# Patient Record
Sex: Female | Born: 1935 | Race: White | Hispanic: No | Smoking: Never smoker
Health system: Southern US, Community
[De-identification: ages and names within clinical notes are randomized; demographics above are authoritative.]

## PROBLEM LIST (undated history)

## (undated) DIAGNOSIS — C50919 Malignant neoplasm of unspecified site of unspecified female breast: Secondary | ICD-10-CM

## (undated) DIAGNOSIS — Z932 Ileostomy status: Secondary | ICD-10-CM

## (undated) DIAGNOSIS — Z8719 Personal history of other diseases of the digestive system: Secondary | ICD-10-CM

## (undated) DIAGNOSIS — K219 Gastro-esophageal reflux disease without esophagitis: Secondary | ICD-10-CM

## (undated) DIAGNOSIS — I499 Cardiac arrhythmia, unspecified: Secondary | ICD-10-CM

## (undated) DIAGNOSIS — S0219XS Other fracture of base of skull, sequela: Secondary | ICD-10-CM

## (undated) DIAGNOSIS — M436 Torticollis: Secondary | ICD-10-CM

## (undated) DIAGNOSIS — H9191 Unspecified hearing loss, right ear: Secondary | ICD-10-CM

## (undated) DIAGNOSIS — F329 Major depressive disorder, single episode, unspecified: Secondary | ICD-10-CM

## (undated) DIAGNOSIS — F32A Depression, unspecified: Secondary | ICD-10-CM

## (undated) DIAGNOSIS — I639 Cerebral infarction, unspecified: Secondary | ICD-10-CM

## (undated) DIAGNOSIS — C50911 Malignant neoplasm of unspecified site of right female breast: Secondary | ICD-10-CM

## (undated) DIAGNOSIS — C801 Malignant (primary) neoplasm, unspecified: Secondary | ICD-10-CM

## (undated) DIAGNOSIS — R0609 Other forms of dyspnea: Secondary | ICD-10-CM

## (undated) DIAGNOSIS — E0590 Thyrotoxicosis, unspecified without thyrotoxic crisis or storm: Secondary | ICD-10-CM

## (undated) DIAGNOSIS — D649 Anemia, unspecified: Secondary | ICD-10-CM

## (undated) DIAGNOSIS — I4891 Unspecified atrial fibrillation: Secondary | ICD-10-CM

## (undated) DIAGNOSIS — E039 Hypothyroidism, unspecified: Secondary | ICD-10-CM

## (undated) DIAGNOSIS — C679 Malignant neoplasm of bladder, unspecified: Secondary | ICD-10-CM

## (undated) DIAGNOSIS — F419 Anxiety disorder, unspecified: Secondary | ICD-10-CM

## (undated) DIAGNOSIS — G459 Transient cerebral ischemic attack, unspecified: Secondary | ICD-10-CM

## (undated) DIAGNOSIS — H9190 Unspecified hearing loss, unspecified ear: Secondary | ICD-10-CM

## (undated) DIAGNOSIS — I10 Essential (primary) hypertension: Secondary | ICD-10-CM

## (undated) DIAGNOSIS — Z972 Presence of dental prosthetic device (complete) (partial): Secondary | ICD-10-CM

## (undated) DIAGNOSIS — I482 Chronic atrial fibrillation, unspecified: Secondary | ICD-10-CM

## (undated) DIAGNOSIS — Z974 Presence of external hearing-aid: Secondary | ICD-10-CM

## (undated) HISTORY — DX: Thyrotoxicosis, unspecified without thyrotoxic crisis or storm: E05.90

## (undated) HISTORY — DX: Cardiac arrhythmia, unspecified: I49.9

## (undated) HISTORY — DX: Transient cerebral ischemic attack, unspecified: G45.9

## (undated) HISTORY — PX: ABDOMINAL HYSTERECTOMY: SHX81

## (undated) HISTORY — PX: JOINT REPLACEMENT: SHX530

## (undated) HISTORY — DX: Malignant neoplasm of unspecified site of unspecified female breast: C50.919

## (undated) HISTORY — PX: REPLACEMENT TOTAL KNEE: SUR1224

## (undated) HISTORY — PX: MASTECTOMY: SHX3

## (undated) HISTORY — PX: EYE SURGERY: SHX253

## (undated) HISTORY — PX: CHOLECYSTECTOMY: SHX55

## (undated) HISTORY — PX: BLADDER SURGERY: SHX569

## (undated) HISTORY — PX: ANKLE ARTHODESIS W/ ARTHROSCOPY: SUR63

## (undated) HISTORY — DX: Malignant neoplasm of bladder, unspecified: C67.9

## (undated) HISTORY — DX: Gastro-esophageal reflux disease without esophagitis: K21.9

---

## 2003-07-01 ENCOUNTER — Other Ambulatory Visit: Payer: Self-pay

## 2004-04-25 ENCOUNTER — Encounter: Payer: Self-pay | Admitting: Internal Medicine

## 2004-05-15 ENCOUNTER — Ambulatory Visit: Payer: Self-pay | Admitting: Family Medicine

## 2004-05-18 ENCOUNTER — Encounter: Payer: Self-pay | Admitting: General Practice

## 2004-05-26 ENCOUNTER — Encounter: Payer: Self-pay | Admitting: General Practice

## 2004-06-12 ENCOUNTER — Encounter: Payer: Self-pay | Admitting: Internal Medicine

## 2004-06-22 ENCOUNTER — Ambulatory Visit: Payer: Self-pay | Admitting: Family Medicine

## 2004-06-26 ENCOUNTER — Ambulatory Visit: Payer: Self-pay

## 2004-07-06 ENCOUNTER — Ambulatory Visit: Payer: Self-pay | Admitting: Family Medicine

## 2004-07-10 ENCOUNTER — Encounter: Payer: Self-pay | Admitting: Internal Medicine

## 2004-07-26 ENCOUNTER — Encounter: Payer: Self-pay | Admitting: Internal Medicine

## 2004-08-26 ENCOUNTER — Encounter: Payer: Self-pay | Admitting: Internal Medicine

## 2004-09-25 ENCOUNTER — Encounter: Payer: Self-pay | Admitting: Internal Medicine

## 2004-10-23 ENCOUNTER — Ambulatory Visit: Payer: Self-pay | Admitting: Family Medicine

## 2004-10-24 ENCOUNTER — Encounter: Payer: Self-pay | Admitting: Internal Medicine

## 2004-11-20 ENCOUNTER — Ambulatory Visit: Payer: Self-pay | Admitting: Family Medicine

## 2004-11-23 ENCOUNTER — Encounter: Payer: Self-pay | Admitting: Internal Medicine

## 2004-12-24 ENCOUNTER — Encounter: Payer: Self-pay | Admitting: Internal Medicine

## 2005-02-17 ENCOUNTER — Encounter: Payer: Self-pay | Admitting: Internal Medicine

## 2005-02-23 ENCOUNTER — Encounter: Payer: Self-pay | Admitting: Internal Medicine

## 2005-04-21 ENCOUNTER — Ambulatory Visit: Payer: Self-pay | Admitting: Nurse Practitioner

## 2005-04-23 ENCOUNTER — Encounter: Payer: Self-pay | Admitting: Internal Medicine

## 2005-04-30 ENCOUNTER — Ambulatory Visit: Payer: Self-pay | Admitting: Family Medicine

## 2005-05-05 ENCOUNTER — Ambulatory Visit: Payer: Self-pay | Admitting: Family Medicine

## 2005-05-21 ENCOUNTER — Encounter: Payer: Self-pay | Admitting: Internal Medicine

## 2005-05-26 ENCOUNTER — Encounter: Payer: Self-pay | Admitting: Internal Medicine

## 2005-06-25 ENCOUNTER — Encounter: Payer: Self-pay | Admitting: Internal Medicine

## 2005-07-26 ENCOUNTER — Encounter: Payer: Self-pay | Admitting: Internal Medicine

## 2005-08-26 ENCOUNTER — Encounter: Payer: Self-pay | Admitting: Internal Medicine

## 2005-09-23 ENCOUNTER — Encounter: Payer: Self-pay | Admitting: Internal Medicine

## 2005-10-24 ENCOUNTER — Encounter: Payer: Self-pay | Admitting: Internal Medicine

## 2005-11-23 ENCOUNTER — Encounter: Payer: Self-pay | Admitting: Internal Medicine

## 2005-12-24 ENCOUNTER — Encounter: Payer: Self-pay | Admitting: Internal Medicine

## 2006-02-03 ENCOUNTER — Ambulatory Visit: Payer: Self-pay

## 2006-02-06 ENCOUNTER — Ambulatory Visit: Payer: Self-pay

## 2006-02-07 ENCOUNTER — Ambulatory Visit: Payer: Self-pay | Admitting: Unknown Physician Specialty

## 2006-02-14 ENCOUNTER — Ambulatory Visit: Payer: Self-pay | Admitting: Unknown Physician Specialty

## 2006-02-25 ENCOUNTER — Ambulatory Visit: Payer: Self-pay | Admitting: Family Medicine

## 2006-03-04 ENCOUNTER — Encounter: Payer: Self-pay | Admitting: General Practice

## 2006-03-24 ENCOUNTER — Ambulatory Visit: Payer: Self-pay | Admitting: Unknown Physician Specialty

## 2006-03-26 ENCOUNTER — Encounter: Payer: Self-pay | Admitting: General Practice

## 2006-03-31 ENCOUNTER — Ambulatory Visit: Payer: Self-pay | Admitting: Family Medicine

## 2006-04-05 ENCOUNTER — Ambulatory Visit: Payer: Self-pay

## 2006-04-19 ENCOUNTER — Ambulatory Visit: Payer: Self-pay | Admitting: General Practice

## 2006-04-19 ENCOUNTER — Other Ambulatory Visit: Payer: Self-pay

## 2006-04-21 ENCOUNTER — Ambulatory Visit: Payer: Self-pay | Admitting: General Practice

## 2006-06-10 ENCOUNTER — Ambulatory Visit: Payer: Self-pay

## 2006-06-14 ENCOUNTER — Ambulatory Visit: Payer: Self-pay

## 2006-06-24 ENCOUNTER — Ambulatory Visit: Payer: Self-pay | Admitting: Surgery

## 2006-06-24 ENCOUNTER — Other Ambulatory Visit: Payer: Self-pay

## 2006-06-27 ENCOUNTER — Ambulatory Visit: Payer: Self-pay | Admitting: Surgery

## 2006-06-30 ENCOUNTER — Ambulatory Visit: Payer: Self-pay | Admitting: Surgery

## 2006-08-01 ENCOUNTER — Ambulatory Visit: Payer: Self-pay | Admitting: Surgery

## 2006-08-15 ENCOUNTER — Ambulatory Visit: Payer: Self-pay | Admitting: Internal Medicine

## 2006-08-17 ENCOUNTER — Encounter: Payer: Self-pay | Admitting: Internal Medicine

## 2006-08-26 ENCOUNTER — Ambulatory Visit: Payer: Self-pay | Admitting: Internal Medicine

## 2006-09-09 ENCOUNTER — Encounter: Payer: Self-pay | Admitting: Internal Medicine

## 2006-09-24 ENCOUNTER — Ambulatory Visit: Payer: Self-pay | Admitting: Radiation Oncology

## 2006-09-24 ENCOUNTER — Ambulatory Visit: Payer: Self-pay | Admitting: Internal Medicine

## 2006-10-07 ENCOUNTER — Encounter: Payer: Self-pay | Admitting: Internal Medicine

## 2006-10-25 ENCOUNTER — Ambulatory Visit: Payer: Self-pay | Admitting: Internal Medicine

## 2006-10-31 ENCOUNTER — Encounter: Payer: Self-pay | Admitting: Internal Medicine

## 2006-11-24 ENCOUNTER — Ambulatory Visit: Payer: Self-pay | Admitting: Internal Medicine

## 2006-11-24 ENCOUNTER — Encounter: Payer: Self-pay | Admitting: Internal Medicine

## 2006-11-25 ENCOUNTER — Ambulatory Visit: Payer: Self-pay | Admitting: Radiation Oncology

## 2006-12-25 ENCOUNTER — Encounter: Payer: Self-pay | Admitting: Internal Medicine

## 2006-12-25 ENCOUNTER — Ambulatory Visit: Payer: Self-pay | Admitting: Radiation Oncology

## 2007-01-24 ENCOUNTER — Encounter: Payer: Self-pay | Admitting: Internal Medicine

## 2007-02-06 ENCOUNTER — Ambulatory Visit: Payer: Self-pay | Admitting: Family Medicine

## 2007-02-24 ENCOUNTER — Ambulatory Visit: Payer: Self-pay | Admitting: Internal Medicine

## 2007-03-06 ENCOUNTER — Encounter: Payer: Self-pay | Admitting: Internal Medicine

## 2007-03-13 ENCOUNTER — Ambulatory Visit: Payer: Self-pay | Admitting: Internal Medicine

## 2007-03-14 ENCOUNTER — Ambulatory Visit: Payer: Self-pay | Admitting: Internal Medicine

## 2007-03-17 ENCOUNTER — Ambulatory Visit: Payer: Self-pay | Admitting: Internal Medicine

## 2007-03-27 ENCOUNTER — Ambulatory Visit: Payer: Self-pay | Admitting: Internal Medicine

## 2007-04-04 ENCOUNTER — Encounter: Payer: Self-pay | Admitting: Internal Medicine

## 2007-04-18 ENCOUNTER — Ambulatory Visit: Payer: Self-pay | Admitting: Unknown Physician Specialty

## 2007-04-21 ENCOUNTER — Ambulatory Visit: Payer: Self-pay | Admitting: Family Medicine

## 2007-04-28 ENCOUNTER — Encounter: Payer: Self-pay | Admitting: Internal Medicine

## 2007-05-19 ENCOUNTER — Ambulatory Visit: Payer: Self-pay | Admitting: General Practice

## 2007-05-19 ENCOUNTER — Other Ambulatory Visit: Payer: Self-pay

## 2007-05-27 ENCOUNTER — Encounter: Payer: Self-pay | Admitting: Internal Medicine

## 2007-06-05 ENCOUNTER — Inpatient Hospital Stay: Payer: Self-pay | Admitting: General Practice

## 2007-07-12 ENCOUNTER — Ambulatory Visit: Payer: Self-pay | Admitting: Internal Medicine

## 2007-08-16 ENCOUNTER — Ambulatory Visit: Payer: Self-pay | Admitting: Neurological Surgery

## 2007-09-23 ENCOUNTER — Emergency Department: Payer: Self-pay | Admitting: Emergency Medicine

## 2008-01-29 ENCOUNTER — Encounter: Payer: Self-pay | Admitting: Internal Medicine

## 2008-02-24 ENCOUNTER — Encounter: Payer: Self-pay | Admitting: Internal Medicine

## 2008-02-24 ENCOUNTER — Ambulatory Visit: Payer: Self-pay | Admitting: Internal Medicine

## 2008-02-27 ENCOUNTER — Ambulatory Visit: Payer: Self-pay | Admitting: Internal Medicine

## 2008-03-26 ENCOUNTER — Ambulatory Visit: Payer: Self-pay | Admitting: Internal Medicine

## 2008-03-29 ENCOUNTER — Encounter: Payer: Self-pay | Admitting: Internal Medicine

## 2008-04-25 ENCOUNTER — Encounter: Payer: Self-pay | Admitting: Internal Medicine

## 2008-06-22 ENCOUNTER — Emergency Department: Payer: Self-pay | Admitting: Emergency Medicine

## 2008-06-24 ENCOUNTER — Ambulatory Visit: Payer: Self-pay | Admitting: Surgery

## 2008-07-01 ENCOUNTER — Inpatient Hospital Stay: Payer: Self-pay | Admitting: Surgery

## 2008-08-02 ENCOUNTER — Ambulatory Visit: Payer: Self-pay | Admitting: Internal Medicine

## 2008-08-12 ENCOUNTER — Ambulatory Visit: Payer: Self-pay | Admitting: Internal Medicine

## 2008-08-26 ENCOUNTER — Ambulatory Visit: Payer: Self-pay | Admitting: Internal Medicine

## 2008-08-29 ENCOUNTER — Ambulatory Visit: Payer: Self-pay | Admitting: Internal Medicine

## 2008-09-23 ENCOUNTER — Ambulatory Visit: Payer: Self-pay | Admitting: Internal Medicine

## 2009-02-17 ENCOUNTER — Ambulatory Visit: Payer: Self-pay | Admitting: Internal Medicine

## 2009-02-23 ENCOUNTER — Ambulatory Visit: Payer: Self-pay | Admitting: Internal Medicine

## 2009-02-26 ENCOUNTER — Ambulatory Visit: Payer: Self-pay | Admitting: Internal Medicine

## 2009-03-07 ENCOUNTER — Ambulatory Visit: Payer: Self-pay | Admitting: Urology

## 2009-03-11 ENCOUNTER — Ambulatory Visit: Payer: Self-pay | Admitting: Urology

## 2009-03-26 ENCOUNTER — Ambulatory Visit: Payer: Self-pay | Admitting: Urology

## 2009-03-26 ENCOUNTER — Ambulatory Visit: Payer: Self-pay | Admitting: Internal Medicine

## 2009-03-28 ENCOUNTER — Inpatient Hospital Stay: Payer: Self-pay | Admitting: Internal Medicine

## 2009-04-01 ENCOUNTER — Ambulatory Visit: Payer: Self-pay | Admitting: Internal Medicine

## 2009-05-30 ENCOUNTER — Emergency Department: Payer: Self-pay | Admitting: Emergency Medicine

## 2009-09-03 ENCOUNTER — Ambulatory Visit: Payer: Self-pay | Admitting: Surgery

## 2010-02-01 ENCOUNTER — Emergency Department: Payer: Self-pay | Admitting: Emergency Medicine

## 2010-02-24 ENCOUNTER — Ambulatory Visit: Payer: Self-pay | Admitting: Urology

## 2010-02-26 LAB — PATHOLOGY REPORT

## 2010-10-08 ENCOUNTER — Ambulatory Visit: Payer: Self-pay | Admitting: Surgery

## 2010-10-13 ENCOUNTER — Ambulatory Visit: Payer: Self-pay | Admitting: Surgery

## 2011-03-27 ENCOUNTER — Ambulatory Visit: Payer: Self-pay | Admitting: Internal Medicine

## 2011-05-26 ENCOUNTER — Ambulatory Visit: Payer: Self-pay | Admitting: Surgery

## 2011-08-17 ENCOUNTER — Emergency Department: Payer: Self-pay | Admitting: Emergency Medicine

## 2011-08-17 LAB — PROTIME-INR
INR: 1.5
Prothrombin Time: 18 secs — ABNORMAL HIGH (ref 11.5–14.7)

## 2011-08-17 LAB — COMPREHENSIVE METABOLIC PANEL
Albumin: 3.7 g/dL (ref 3.4–5.0)
Alkaline Phosphatase: 67 U/L (ref 50–136)
Calcium, Total: 9 mg/dL (ref 8.5–10.1)
EGFR (Non-African Amer.): >60
Glucose: 95 mg/dL (ref 65–99)
SGOT(AST): 23 U/L (ref 15–37)

## 2011-08-17 LAB — URINALYSIS, COMPLETE
Glucose,UR: NEGATIVE mg/dL (ref 0–75)
Ketone: NEGATIVE
Nitrite: NEGATIVE
Protein: NEGATIVE
Specific Gravity: 1.009 (ref 1.003–1.030)

## 2011-08-17 LAB — CBC WITH DIFFERENTIAL/PLATELET
Basophil #: 0 10*3/uL (ref 0.0–0.1)
Basophil %: 0.5 %
Eosinophil #: 0.1 10*3/uL (ref 0.0–0.7)
Eosinophil %: 2.1 %
HCT: 41.6 % (ref 35.0–47.0)
HGB: 14 g/dL (ref 12.0–16.0)
Lymphocyte %: 23.2 %
MCH: 32.2 pg (ref 26.0–34.0)
MCHC: 33.7 g/dL (ref 32.0–36.0)
MCV: 96 fL (ref 80–100)
Monocyte #: 0.4 10*3/uL (ref 0.0–0.7)
Neutrophil %: 67.3 %
RDW: 13.2 % (ref 11.5–14.5)

## 2011-08-17 LAB — CK TOTAL AND CKMB (NOT AT ARMC)
CK, Total: 72 U/L (ref 21–215)
CK-MB: 0.8 ng/mL (ref 0.5–3.6)

## 2011-08-17 LAB — TROPONIN I: Troponin-I: <0.02 ng/mL

## 2011-08-19 LAB — URINE CULTURE

## 2011-09-06 ENCOUNTER — Ambulatory Visit: Payer: Self-pay | Admitting: Neurology

## 2012-01-20 ENCOUNTER — Ambulatory Visit: Payer: Self-pay | Admitting: Surgery

## 2012-02-01 ENCOUNTER — Ambulatory Visit: Payer: Self-pay | Admitting: Surgery

## 2012-08-08 ENCOUNTER — Ambulatory Visit: Payer: Self-pay | Admitting: Surgery

## 2012-11-13 ENCOUNTER — Inpatient Hospital Stay: Payer: Self-pay | Admitting: Orthopedic Surgery

## 2012-11-13 LAB — CBC
HCT: 37.1 % (ref 35.0–47.0)
MCHC: 33.6 g/dL (ref 32.0–36.0)
MCV: 91 fL (ref 80–100)
RBC: 4.09 10*6/uL (ref 3.80–5.20)
RDW: 14.7 % — ABNORMAL HIGH (ref 11.5–14.5)
WBC: 10.2 10*3/uL (ref 3.6–11.0)

## 2012-11-13 LAB — BASIC METABOLIC PANEL
BUN: 14 mg/dL (ref 7–18)
Calcium, Total: 8.6 mg/dL (ref 8.5–10.1)
Chloride: 110 mmol/L — ABNORMAL HIGH (ref 98–107)
Co2: 27 mmol/L (ref 21–32)
Creatinine: 0.64 mg/dL (ref 0.60–1.30)
EGFR (African American): >60
EGFR (Non-African Amer.): >60
Sodium: 141 mmol/L (ref 136–145)

## 2012-11-13 LAB — PROTIME-INR: INR: 2.6

## 2012-11-14 LAB — CBC WITH DIFFERENTIAL/PLATELET
Basophil #: 0 10*3/uL (ref 0.0–0.1)
Eosinophil #: 0.1 10*3/uL (ref 0.0–0.7)
Eosinophil %: 1.7 %
HCT: 33.8 % — ABNORMAL LOW (ref 35.0–47.0)
HGB: 11.4 g/dL — ABNORMAL LOW (ref 12.0–16.0)
Lymphocyte #: 1.4 10*3/uL (ref 1.0–3.6)
MCH: 30.8 pg (ref 26.0–34.0)
MCHC: 33.7 g/dL (ref 32.0–36.0)
Neutrophil #: 3.2 10*3/uL (ref 1.4–6.5)
Neutrophil %: 59.6 %
Platelet: 232 10*3/uL (ref 150–440)
RDW: 14.3 % (ref 11.5–14.5)
WBC: 5.4 10*3/uL (ref 3.6–11.0)

## 2012-11-14 LAB — PROTIME-INR
INR: 1.8
INR: 1.4

## 2012-11-14 LAB — BASIC METABOLIC PANEL
Anion Gap: 5 — ABNORMAL LOW (ref 7–16)
BUN: 10 mg/dL (ref 7–18)
Calcium, Total: 8.3 mg/dL — ABNORMAL LOW (ref 8.5–10.1)
Chloride: 107 mmol/L (ref 98–107)
EGFR (African American): >60
EGFR (Non-African Amer.): >60
Glucose: 111 mg/dL — ABNORMAL HIGH (ref 65–99)
Osmolality: 277 (ref 275–301)
Potassium: 3.3 mmol/L — ABNORMAL LOW (ref 3.5–5.1)
Sodium: 139 mmol/L (ref 136–145)

## 2012-11-15 ENCOUNTER — Encounter: Payer: Self-pay | Admitting: Internal Medicine

## 2012-11-15 LAB — CBC WITH DIFFERENTIAL/PLATELET
Basophil #: 0 10*3/uL (ref 0.0–0.1)
Basophil %: 0.3 %
Eosinophil #: 0.1 10*3/uL (ref 0.0–0.7)
Lymphocyte #: 0.7 10*3/uL — ABNORMAL LOW (ref 1.0–3.6)
Lymphocyte %: 9 %
MCH: 30.7 pg (ref 26.0–34.0)
MCHC: 33.4 g/dL (ref 32.0–36.0)
MCV: 92 fL (ref 80–100)
Monocyte %: 11.2 %
Neutrophil #: 6.2 10*3/uL (ref 1.4–6.5)
Neutrophil %: 78.5 %
RDW: 13.9 % (ref 11.5–14.5)
WBC: 7.9 10*3/uL (ref 3.6–11.0)

## 2012-11-15 LAB — BASIC METABOLIC PANEL
Anion Gap: 5 — ABNORMAL LOW (ref 7–16)
BUN: 9 mg/dL (ref 7–18)
Calcium, Total: 8.3 mg/dL — ABNORMAL LOW (ref 8.5–10.1)
Co2: 29 mmol/L (ref 21–32)
Creatinine: 0.58 mg/dL — ABNORMAL LOW (ref 0.60–1.30)
EGFR (Non-African Amer.): >60
Glucose: 117 mg/dL — ABNORMAL HIGH (ref 65–99)
Osmolality: 275 (ref 275–301)
Sodium: 138 mmol/L (ref 136–145)

## 2012-11-15 LAB — PROTIME-INR: INR: 1.5

## 2012-11-16 LAB — BASIC METABOLIC PANEL
Anion Gap: 4 — ABNORMAL LOW (ref 7–16)
Chloride: 104 mmol/L (ref 98–107)
Creatinine: 0.79 mg/dL (ref 0.60–1.30)
EGFR (Non-African Amer.): >60
Osmolality: 277 (ref 275–301)
Potassium: 3.3 mmol/L — ABNORMAL LOW (ref 3.5–5.1)
Sodium: 139 mmol/L (ref 136–145)

## 2012-11-16 LAB — PROTIME-INR: Prothrombin Time: 18.1 secs — ABNORMAL HIGH (ref 11.5–14.7)

## 2012-11-20 LAB — PROTIME-INR
INR: 1.9
Prothrombin Time: 21.3 s — ABNORMAL HIGH

## 2012-11-23 ENCOUNTER — Encounter: Payer: Self-pay | Admitting: Internal Medicine

## 2012-11-23 LAB — PROTIME-INR
INR: 2.2
Prothrombin Time: 23.9 secs — ABNORMAL HIGH (ref 11.5–14.7)

## 2012-11-28 LAB — PROTIME-INR: INR: 2.5

## 2012-12-24 ENCOUNTER — Encounter: Payer: Self-pay | Admitting: Internal Medicine

## 2013-05-08 ENCOUNTER — Ambulatory Visit: Payer: Self-pay | Admitting: Internal Medicine

## 2013-05-08 LAB — URINALYSIS, COMPLETE
Bilirubin,UR: NEGATIVE
Glucose,UR: NEGATIVE mg/dL (ref 0–75)
Specific Gravity: 1.01 (ref 1.003–1.030)
WBC UR: >30 /HPF (ref 0–5)

## 2013-05-10 LAB — URINE CULTURE

## 2014-10-28 ENCOUNTER — Ambulatory Visit
Admit: 2014-10-28 | Disposition: A | Discharge status: Home / Self Care | Payer: Self-pay | Attending: Ophthalmology | Admitting: Ophthalmology

## 2014-10-28 LAB — PROTIME-INR
INR: 2
PROTHROMBIN TIME: 22.7 s — AB

## 2014-11-04 ENCOUNTER — Ambulatory Visit
Admit: 2014-11-04 | Disposition: A | Discharge status: Home / Self Care | Payer: Self-pay | Attending: Ophthalmology | Admitting: Ophthalmology

## 2014-11-15 NOTE — H&P (Signed)
Subjective/Chief Complaint left ankle pain   History of Present Illness [place] while walking out her door.  No LOC.  No other injury.  Notes severe left ankle pain. Denies numbness and tingling. History of prior ORIF fibular over 20 years ago by Dr. Marry Guan.   Past Med/Surgical Hx:  CVA/Stroke:   thyroid:   Depression:   Hypertension:   Atrial Fibrillation:   Right  Partial Mastectomy:   Cholecystectomy:   Right Total Knee Replacement:   ankle: questionable which side  breast bx: right  bladder:   knee x3:   Hysterectomy - Total:   ALLERGIES:  Celebrex: Itching, Swelling, Rash  Morphine Sulfate: Swelling  HOME MEDICATIONS: Medication Instructions Status  Zoloft 50 mg oral tablet 1 tab(s) orally once a day Active  Coumadin 2.5 mg oral tablet 1 tab(s) orally once a day Active  Coumadin 1 mg oral tablet 1 tab(s) orally once a day Active  Synthroid 137 mcg (0.137 mg) oral tablet 1 tab(s) orally once a day Active  Diltiazem Hydrochloride ER 240 mg/24 hours oral capsule, extended release 1 cap(s) orally once a day Active  Xanax 0.5 mg oral tablet 1 tab(s) orally once a day (at bedtime), As Needed - for Anxiety, Nervousness Active  omeprazole 20 mg oral delayed release capsule 1 cap(s) orally once a day Active  simvastatin 10 mg oral tablet 1 tab(s) orally once a day (at bedtime) Active   Family and Social History:  Family History Non-Contributory   Social History negative tobacco, negative ETOH, negative Illicit drugs   Place of Living Home  lives alone. no assistive device   Review of Systems:  Subjective/Chief Complaint Left ankle pain after fall   Fever/Chills No   Cough No   Sputum No   Abdominal Pain No   Diarrhea No   Constipation No   Nausea/Vomiting No   SOB/DOE No   Chest Pain No   Telemetry Reviewed Afib   Dysuria No   Medications/Allergies Reviewed Medications/Allergies reviewed   Physical Exam:  GEN well developed, no acute distress, good  insight   HEENT PERRL, hearing intact to voice, moist oral mucosa, hard of hearing - no aid.   NECK supple  No masses  trachea midline   RESP normal resp effort  clear BS  no use of accessory muscles   CARD no thrills  LE edema present  + atrial fib   ABD denies tenderness  no liver/spleen enlargement  hypoactive BS   EXTR negative cyanosis/clubbing, left ankle in splint - unwrapped for exam. skin without lesion. DPN, SPN, tib n intact. cap refill <2 secs. Moves toes.   SKIN normal to palpation, No rashes, No ulcers, skin turgor decreased   NEURO follows commands   PSYCH alert, A+O to time, place, person, good insight   Lab Results: Routine BB:  21-Apr-14 14:03   ABO Group + Rh Type A Positive  Antibody Screen NEGATIVE (Result(s) reported on 13 Nov 2012 at 03:07PM.)  Fresh Frozen Plasma Unit 1 Issued  Fresh Frozen Plasma Unit 2 Ready (Result(s) reported on 13 Nov 2012 at 03:19PM.)  Routine Chem:  21-Apr-14 10:06   Glucose, Serum  103  BUN 14  Creatinine (comp) 0.64  Sodium, Serum 141  Potassium, Serum 3.7  Chloride, Serum  110  CO2, Serum 27  Calcium (Total), Serum 8.6  Anion Gap  4  Osmolality (calc) 282  eGFR (African American) >60  eGFR (Non-African American) >60 (eGFR values <34m/min/1.73 m2 may be an indication  of chronic kidney disease (CKD). Calculated eGFR is useful in patients with stable renal function. The eGFR calculation will not be reliable in acutely ill patients when serum creatinine is changing rapidly. It is not useful in  patients on dialysis. The eGFR calculation may not be applicable to patients at the low and high extremes of body sizes, pregnant women, and vegetarians.)  Routine Coag:  21-Apr-14 10:06   Prothrombin  27.0  INR 2.6 (INR reference interval applies to patients on anticoagulant therapy. A single INR therapeutic range for coumarins is not optimal for all indications; however, the suggested range for most indications is 2.0 -  3.0. Exceptions to the INR Reference Range may include: Prosthetic heart valves, acute myocardial infarction, prevention of myocardial infarction, and combinations of aspirin and anticoagulant. The need for a higher or lower target INR must be assessed individually. Reference: The Pharmacology and Management of the Vitamin K  antagonists: the seventh ACCP Conference on Antithrombotic and Thrombolytic Therapy. AEPNT.7505 Sept:126 (3suppl): [id number]. A HCT value >55% may artifactually increase the PT.  In one study,  the increase was an average of 25%. Reference:  "Effect on Routine and Special Coagulation Testing Values of Citrate Anticoagulant Adjustment in Patients with High HCT Values." American Journal of Clinical Pathology 2006;126:400-405.)  Routine Hem:  21-Apr-14 10:06   WBC (CBC) 10.2  RBC (CBC) 4.09  Hemoglobin (CBC) 12.5  Hematocrit (CBC) 37.1  Platelet Count (CBC) 242 (Result(s) reported on 13 Nov 2012 at 10:44AM.)  MCV 91  MCH 30.5  MCHC 33.6  RDW  14.7   Radiology Results: LabUnknown:    21-Apr-14 09:00, Ankle Left Complete  PACS Image    Assessment/Admission Diagnosis left ankle distal fibular fracture comminuted about old plate/screws. Small post mal fragment. Syndesmosis injury.   Plan Admit today for correction of INR from 2.6 to <1.5 overnight. Plan for ORIF of ankle and syndesmosis tomorrow. Risks and benefits explains to patient and daughter. Understand risk of infection, blood loss, failure, difficulty with removal of old hardware.  NPO after midnight.  Medical consult for clearance.   Electronic Signatures: Dawayne Patricia (MD)  (Signed 21-Apr-14 16:51)  Authored: CHIEF COMPLAINT and HISTORY, PAST MEDICAL/SURGIAL HISTORY, ALLERGIES, HOME MEDICATIONS, FAMILY AND SOCIAL HISTORY, REVIEW OF SYSTEMS, PHYSICAL EXAM, LABS, Radiology, ASSESSMENT AND PLAN   Last Updated: 21-Apr-14 16:51 by Dawayne Patricia (MD)

## 2014-11-15 NOTE — Consult Note (Signed)
Brief Consult Note: Diagnosis: 1. Pre-operative Medical Evaluation 2. Chronic a. fib 3. HTN 4. Depression 5. Hyperlipidemia.   Patient was seen by consultant.   Consult note dictated.   Recommend to proceed with surgery or procedure.   Orders entered.   Comments: 81 yo female w/ hx of Osteoarthritis, Chronic a. fib, HtN, Depression, Hyperlipidemia came into hospital after a fall and noted to have Left Ankle Fracture.   1. Pre-operative evaluation - pt. is likely a moderate risk for non-cardiac surgery.  - no contraindications to surgery at this time.  - ECG reviewed and non acute changes.   2. Chronic a. fib - rate controlled and cont. Cardizem.  - will reverse INR  w/ FFP and Vitamin K as pt. needs surgery by tomorrow.   3. HTN - cont. Cardizem  4. Depression - cont. Zoloft.   5. Hypothyroidism - cont. synthroid.   6. hx of previous CVA - pt. had a previous CVA/TIA when taken off coumadin before and therefore I would restart heparin gtt after surgery tomorrow and then resume coumadin later that evening or the next day.    Thanks for the consult and will follow with you.  Job # [id number].  Electronic Signatures: Henreitta Leber (MD)  (Signed 21-Apr-14 14:03)  Authored: Brief Consult Note   Last Updated: 21-Apr-14 14:03 by Henreitta Leber (MD)

## 2014-11-15 NOTE — Op Note (Signed)
PATIENT NAME:  Gabriela [name], Gabriela [name] MR#:  [id number] DATE OF BIRTH:  04/28/1932  DATE OF SURGERY:  11/14/2012  PREOPERATIVE DIAGNOSIS: Comminuted fracture, left distal fibula about previous fibular plate, with syndesmotic separation.   POSTOPERATIVE DIAGNOSIS: Comminuted fracture, left distal fibula about previous fibular plate, with syndesmotic separation.    PROCEDURES PERFORMED:  1.  Removal of hardware from left distal fibula.  2.  Open reduction and internal fixation of left distal fibula fracture.  3.  Syndesmotic fixation using tightrope mechanism.  4.  Filling of bone defect using calcium phosphate, injectable.   SURGEON: Shalini Ramasunder M.D.   ASSISTANT: None.   TOURNIQUET TIME: 130 minutes at 300 mmHg.   FINDINGS: Severely comminuted fracture about lateral fibular plate inserted over 20 years ago. Poor bone quality. Significantly widened and unstable syndesmosis on Cotton testing and external rotation.   IMPLANTS: Arthrex 8-hole distal fibular plate, not with tightrope syndesmosis repair. Implants x 2, Arthrex Quickset kit, injectable macroporous calcium phosphate solution, 5 mL.   COMPLICATIONS: None.   DISPOSITION: The patient will be placed in a splint. She will be nonweightbearing. She will return to the office in 1 week for a clinical check.   INDICATIONS FOR PROCEDURE: The patient is a 81-year-old female who presented to the Emergency Room after sustaining a fall down several steps. She sustained a severely displaced fracture of the left ankle. She had a prior fracture 20 years ago, that was plated. Current fracture demonstrates significant comminution about the prior screw tips. There was also significant syndesmotic disruption.   Risks and benefits of the procedure were explained to the patient and her daughter. Informed consent was obtained.   DESCRIPTION OF PROCEDURE: The patient was identified in the preoperative holding area. Left ankle was marked.   The patient  was brought into the operating room where general anesthesia was administered. The splint was removed. Tourniquet was applied. Left ankle was prepared and draped in the usual sterile fashion. Leg was elevated and tourniquet was inflated to 300 mmHg. Using fluoroscopic guidance and the prior incision,  incision was made directly over the lateral fibula. Using the Synthes screw removal set, 7 screws were removed and a contoured compression plate was removed. There was extensive scar and bony overgrowth. After removal of the plate a severely comminuted fracture of the distal fibula with extremely poor bone quality was noted. All prior screw holes were curetted. There was no evidence of infection.   At this time the ankle was very copiously irrigated. Clamp reduction was done to hold the fracture aligned. An 8-hole distal fibular Arthrex plate was trialed for length. This would achieve adequate bridging of the fracture with good distal fixation using a locking screw mechanism and at least 6 cortices of fixation above the fracture site. The plate was held in place using a tack. Confirmation of placement of the plate was confirmed on orthogonal imaging. The plate was placed in a slightly posterolateral position in order to achieve better fixation and allow for better soft tissue coverage. A proximal cortical screw was drilled in place, and bone integrity was found to be stable.   At this time with tacks securing the plate distally and one point of fixation proximally distal, all  5 locking screws in the distal fragment were filled using multiple 2.5 locking screws. Attention was returned to the proximal aspect of the, fibula where 2 additional holes were filled above the fracture site in a bicortical fashion. On orthogonal radiographs, excellent alignment of the   fracture was achieved. At this time with fibula fixation complete, the syndesmosis was stressed and gapped widely open. One of the distal holes at the level  of the distal syndesmosis was filled with a guidewire. Placement of the guidewire was confirmed on orthogonal radiographs and found to be in good position for syndesmotic fixation. This was overdrilled, and a knotless tightrope syndesmosis repair implant was passed through. Using a tightrope mechanism, the syndesmosis was nicely reduced.     The fibula was again pulled and the joint was again stressed, and the syndesmosis was found to be nicely fixed. For additional fixation, using a similar technique a second syndesmotic tightrope was placed just distal to the initial one. Syndesmosis was found to have excellent stability as with the fracture site.   Using 5 mL of injectable macroporous calcium phosphate  through a 14-gauge Angiocath, the prior screw holes were filled as was a small area of weight in the fracture site, and once the Quickset was dried, the wound was again copiously irrigated. Care was taken to ensure that the peroneal tendons were completely free of the plate through implantation. They traveled nicely with motion of the ankle. A soft tissue sleeve was gently reapproximated over the plate to achieve some additional soft tissue coverage. Subcutaneous tissue was closed using a 2-0 Vicryl, and skin was closed using staples. A sterile bulky dressing was applied and the patient was placed in a posterior and sugar tong splint.   She will be nonweightbearing. She will be kept in the hospital for pain management as well as for physical therapy. She will likely require skilled nursing placement. We will see her back in the office 1 week postoperatively for a wound check.      ____________________________ Shalini Ramasunder, MD sr:dm D: 11/16/2012 09:08:35 ET T: 11/16/2012 09:29:25 ET JOB#: [id number]  cc: Shalini Ramasunder, MD, <Dictator> SHALINI RAMASUNDER MD ELECTRONICALLY SIGNED 12/13/2012 9:07 

## 2014-11-15 NOTE — Consult Note (Signed)
PATIENT NAME:  Gabriela [name], Gabriela [name] MR#:  [id number] DATE OF BIRTH:  1931-09-11  DATE OF CONSULTATION:  11/13/2012  REFERRING PHYSICIAN:  Dawayne Patricia, MD CONSULTING PHYSICIAN:  Belia Heman. Verdell Carmine, MD  REASON FOR CONSULTATION: Preoperative medical evaluation and medical management.   HISTORY OF PRESENT ILLNESS: This is a 81 year old female who presents to the hospital after a fall and noted to have a left ankle fracture. She was getting out of her house and fell. She called one of her friends, who called her neighbor, and who picked her up and brought her to the ER. The patient had an x-ray of the left ankle which showed a fracture of the medial malleolus. The patient had no prodromal symptoms prior to the fall like any chest pain, shortness of breath, dizziness, palpitations, any true syncope or any other associated symptoms. Hospitalist services were contacted for preoperative medical evaluation and medical management.   REVIEW OF SYSTEMS:  CONSTITUTIONAL: No documented fever. No weight gain, no weight loss.  EYES: No blurry or double vision.  ENT: No tinnitus. No postnasal drip. No redness of the oropharynx.  RESPIRATORY: No cough, no wheeze, no hemoptysis.  CARDIOVASCULAR: No chest pain, no orthopnea, no palpitations, no syncope.  GASTROINTESTINAL: No nausea, no vomiting, diarrhea, no abdominal pain, no melena or hematochezia.  GENITOURINARY: No dysuria, no hematuria.  ENDOCRINE: No polyuria or nocturia, heat or cold intolerance. HEMATOLOGIC: No anemia, no bruising, no bleeding.  INTEGUMENTARY: No rashes, no lesions.  MUSCULOSKELETAL: No arthritis, no swelling, no gout.  NEUROLOGIC: No numbness, no tingling, no ataxia. No seizure-type activity.  PSYCHIATRIC: No anxiety. No insomnia. No ADD. Positive depression.   PAST MEDICAL HISTORY: Consistent with osteoarthritis with bilateral knee replacements, history of breast cancer, history of bladder cancer, hypertension, chronic atrial  fibrillation, GERD, hyperlipidemia and hypothyroidism.   ALLERGIES: CELEBREX AND MORPHINE SULFATE.  MORPHINE CAUSES ANAPHYLAXIS.  SOCIAL HISTORY: No smoking. No alcohol abuse. No illicit drug abuse. She lives at home by herself.   FAMILY HISTORY: She was adopted. She cannot recall her family history.   CURRENT MEDICATIONS: Coumadin 1 mg daily. Coumadin   2.5 mg daily with a total of 3.5 mg daily, Cardizem 240 mg daily, omeprazole 20 mg daily, simvastatin 10 mg daily, Synthroid 137 mcg daily, Xanax 0.5 mg at bedtime as needed, Zoloft 50 mg daily.   PHYSICAL EXAMINATION: VITAL SIGNS: On admission, temperature is 98, pulse 94, respirations 18, blood pressure 168/79, sats 97% on room air.  GENERAL: She is a pleasant-appearing female in no apparent distress.  HEENT: She is atraumatic, normocephalic. Extraocular muscles are intact. Pupils are equal, reactive to light. Sclerae are anicteric. No conjunctival injection. No pharyngeal erythema.  NECK: Supple. There is no jugular venous distention, no bruits, no lymphadenopathy or thyromegaly.  HEART: Irregular. No murmurs, no rubs, no clicks.  LUNGS: Clear to auscultation bilaterally. No rales, no rhonchi, no wheezes.  ABDOMEN: Soft, flat, nontender, nondistended. Has good bowel sounds. No hepatosplenomegaly appreciated.  EXTREMITIES: No evidence of any cyanosis, clubbing, or peripheral edema. Has +2 pedal and radial pulses bilaterally. Her left lower extremity is in a cast due to her fracture.  NEUROLOGIC: She is alert, awake and oriented x3 with no focal motor or sensory deficits appreciated bilaterally.  SKIN: Moist and warm with no rashes.  LYMPHATIC: There is no cervical or axillary lymphadenopathy.   LABORATORY AND RADIOLOGICAL DATA:  Serum glucose 103, BUN 14, creatinine 0.6, sodium 141, potassium 3.7, chloride 110, bicarbonate 27. White cell count is  10.2, hemoglobin 12.5, hematocrit 37.1, platelet count 242. INR is 2.6.   The patient did have  an x-ray of the left ankle which showed a nondisplaced fracture involving the medial malleolus, a chest x-ray which showed no acute cardiopulmonary disease, and an EKG which showed chronic atrial fibrillation with no evidence of any acute ST or T-wave changes.   ASSESSMENT AND PLAN: This is a 81 year old female with a history of severe osteoarthritis, chronic a-fib, hypertension, depression, hyperlipidemia, hypothyroidism, history of breast and bladder cancer, presents to the hospital after a fall and noted to have a left ankle fracture.   1. Preoperative evaluation: The patient is likely a moderate risk for noncardiac surgery. There is no absolute contraindication to surgery at this time. The patient's EKG has been reviewed and showed no acute changes. I would continue her Cardizem for now.  2. Chronic atrial fibrillation:  The patient is currently rate controlled. I will continue Cardizem for now. The patient's INR is 2.6, therapeutic, but needs to be reversed because she needs surgery; therefore, I will give her some vitamin K and FFP and reverse her Coumadin for now.  3. Hypertension: Continue Cardizem.  4. Depression: Continue Zoloft.  5. Hypothyroidism: Continue Synthroid.  6. Hyperlipidemia: Continue simvastatin.   Thank you so much for the consultation. We will follow along with you.   TIME SPENT: 50 minutes.   ____________________________ Belia Heman. Verdell Carmine, MD vjs:cb D: 11/13/2012 13:56:39 ET T: 11/13/2012 14:18:59 ET JOB#: [id number]  cc: Belia Heman. Verdell Carmine, MD, <Dictator> Henreitta Leber MD ELECTRONICALLY SIGNED 11/13/2012 17:43

## 2014-11-15 NOTE — Discharge Summary (Signed)
PATIENT NAME:  Gabriela [name], Gabriela [name] MR#:  [id number] DATE OF BIRTH:  1932/04/24  DATE OF ADMISSION:  11/13/2012 DATE OF DISCHARGE:  11/16/2012  ADMITTING DIAGNOSIS: Left bimalleolar ankle fracture.   DISCHARGE DIAGNOSIS:  Left bimalleolar ankle fracture.  OPERATION: On 11/14/2012, she had a hardware removal with open reduction and internal fixation left bimalleolar ankle fracture.   SURGEON: Dawayne Patricia, MD.   ANESTHESIA: General.   ESTIMATED BLOOD LOSS: 50 mL.   DRAINS: None.   COMPLICATIONS: None.  IMPLANTS: Arthrex (operative report has not been dictated to review).   HISTORY: The patient is a 81 year old female, who on 11/13/2012 fell while walking out of her door. She has had a history of previous ORIF with fibular fracture over 20 years ago with Dr. Marry Guan. She came into the [facility] the same day where x-rays confirmed a bimalleolar ankle fracture. Medicine was consulted because she had an INR of 2.6.    PHYSICAL EXAMINATION:  GENERAL: Well-developed, no acute distress. Good insight.  HEENT: PERRL. Hearing intact to voice. Moist oral mucosa. Hard of hearing. No hearing aid.  NECK: Supple, no masses. Trachea midline.  RESPIRATORY: Normal respiratory effort. Clear breath sounds. No use of accessory muscles.  CARDIOVASCULAR: No thrills. Right lower extremity deep present. Positive atrial fib.  ABDOMEN: Denies tenderness. No liver or spleen enlargement. Hypoactive bowel sounds.  EXTREMITIES: Negative cyanosis and clubbing. Left ankle in splint. Unwrapped for exam.  SKIN: Without any lesions. DPN, SPN and tibial nerve intact. Cap refill less than 2 seconds. Moves toes. SKIN: Normal to palpation. No rashes. No ulcers. Skin turgor decreased.  NEUROLOGIC: Follows commands.  PSYCHIATRIC: Alert and oriented to time place and person. Good insight.   HOSPITAL COURSE: After initial admission on 11/13/2012, medicine was consulted. She was found to have an INR of 2.6. Before undergoing  surgery, her INR needed to be normalized. On the next day, 11/14/2012, she had 2 units of FFP infused. She had a repeat INR after the plasma of 1.4 and it was felt for her to be safe to undergo surgery. She had surgery that day by Dr. Joie Bimler. She had good pain control afterwards and was brought to the orthopedic floor from the PACU. Her initial hemoglobin was 11.1. On postoperative day 1, 11/15/2012, physical therapy was begun and she was nonweightbearing on the left lower extremity. Her hemoglobin on postoperative day 1 was 10.5. Her INR was 1.5 and she was started on Lovenox for DVT prophylaxis. On postoperative day 2, 11/16/2012, she is stable and ready for discharge. Her INR is 1.5. PT is 18.1.   CONDITION AT DISCHARGE: Stable.   DISPOSITION: The patient was sent to rehab.   DISCHARGE INSTRUCTIONS:  1.  The patient will follow up with [facility] in two weeks for a skin check.   2.  She will do physical therapy and be nonweightbearing left lower extremity using a walker.  3.  She will have a regular diet.  4.  Her left lower extremity should be elevated when she is in the bed.  5.  Her dressing and splint are not to be touched until her followup appointment with orthopedics.   DISCHARGE MEDICATIONS:  1.  Zoloft 50 mg 1 tab p.o. once daily.  2.  Synthroid 137 mcg, 1 tab p.o. once daily.  3.  Diltiazem/hydrochloride ER 240/24-hour oral capsule extended release 1 capsule p.o. once daily.  4.  Xanax 0.5 mg oral tablet 1 tab p.o. at bedtime p.r.n. anxiety and nervousness.  5.  Omeprazole 20 mg delayed-release 1 cap p.o. once daily.  6.  Simvastatin 10 mg 1 tab p.o. at bedtime.  7.  Acetaminophen/hydrocodone 325/5, 1 tab p.o. q. 4 to 6 hours p.r.n. pain.  8.  Magnesium hydroxide 8% oral suspension 30 mL p.o. twice daily p.r.n. constipation.  9.  Warfarin 1 mg tablets 5 tablets once daily.  10. Bisacodyl 10 mg rectal suppository one suppository PR once daily p.r.n.  constipation.  11. Docusate 100 mg oral capsule 1 capsule p.o. b.i.d.  12. Calcium/vitamin D 500/200, 1 tab p.o. b.i.d. with meals.  13. Enoxaparin 40 mg subcutaneous once daily.   She is to continue the Lovenox subcutaneous until her INR greater than 2.0. Her INR should be checked daily until it is 2.0. The Lovenox will be stopped at that time and she will continue with her Coumadin. After her INR is a greater than 2, her INR should be checked weekly and her Coumadin titrated appropriately for a goal INR of 2.0 to 3.0.  ____________________________ Quanda Pavlicek M. Tretha Sciara, NP amb:aw D: 11/16/2012 08:17:22 ET T: 11/16/2012 08:26:27 ET JOB#: [id number]  cc: Suhaylah Wampole M. Tretha Sciara, NP, <Dictator> Kem Kays Leverne Amrhein FNP ELECTRONICALLY SIGNED 12/08/2012 14:00

## 2014-11-24 NOTE — Op Note (Signed)
PATIENT NAME:  Gabriela [name], Gabriela [name] MR#:  [id number] DATE OF BIRTH:  [birth year]  DATE OF PROCEDURE:  11/04/2014  PREOPERATIVE DIAGNOSIS: Nuclear sclerotic cataract, right eye.   POSTOPERATIVE DIAGNOSIS:  Nuclear sclerotic cataract, right eye.  PROCEDURE PERFORMED: Extracapsular cataract extraction using phacoemulsification with placement Alcon [id number], 21.0-diopter posterior chamber lens, serial [id number].   ANESTHESIA: 4% lidocaine. 0.75% Marcaine 50-50 mixture with 10 units/mL of Hylenex added given as a peribulbar.  COMPLICATIONS: None.   ESTIMATED BLOOD LOSS: Less than 1 mL.   SURGEON:  Loura Back. Sebrena Engh, MD  ASSISTANT:  None.  DESCRIPTION OF PROCEDURE:  The patient was brought to the operating room and given a peribulbar block.  The patient was then prepped and draped in the usual fashion.  The vertical rectus muscles were imbricated using 5-0 silk sutures.  These sutures were then clamped to the sterile drapes as bridle sutures.  A limbal peritomy was performed extending two clock hours and hemostasis was obtained with cautery.  A partial thickness scleral groove was made at the surgical limbus and then dissected anteriorly in a lamellar dissection with using an Alcon crescent knife.  The anterior chamber was entered superonasally with a Superblade and through the lamellar dissection with a 2.6-mm keratome.  DisCoVisc was used to replace the aqueous and a continuous tear capsulorrhexis was carried out.  Hydrodissection and hydrodelineation were carried out with balanced salt and a 27 gauge canula.  The nucleus was rotated to confirm the effectiveness of the hydrodissection.  Phacoemulsification was carried out using a divide-and-conquer technique.  Total ultrasound time was 1 minute and 31 seconds with an average power of 25.9 percent.  CVE 39.99.    Irrigation/aspiration was used to remove the residual cortex.  DisCoVisc was used to inflate the capsule and the internal wound was  enlarged to 3 mm with the crescent knife.  The intraocular lens was inserted into the capsular bag using the AcrySert delivery system.  Irrigation/aspiration was used to remove the residual DisCoVisc.  A 10th of a mL of cefuroxime containing 1 mg of drug was injected via the paracentesis track to inflate the anterior chamber and induce miosis.  The wound was checked for leaks and wound leakage was found.  A single 10-0 suture was placed across the incision, tied and the knot was rotated superiorly.  The conjunctiva was closed with cautery and the bridle sutures were removed.  Two drops of 0.3% Vigamox were placed on the eye.  An eye shield was placed on the eye.  The patient was discharged to the recovery room in good condition.       ________________________ Loura Back Samarie Pinder, MD sad:sp D: 11/04/2014 12:57:49 ET T: 11/04/2014 16:59:16 ET JOB#: [id number]  cc: Remo Lipps A. Kameelah Minish, MD, <Dictator> Martie Lee MD ELECTRONICALLY SIGNED 11/11/2014 13:24

## 2014-11-28 ENCOUNTER — Encounter: Payer: Self-pay | Admitting: *Deleted

## 2014-11-28 DIAGNOSIS — Z9011 Acquired absence of right breast and nipple: Secondary | ICD-10-CM | POA: Diagnosis not present

## 2014-11-28 DIAGNOSIS — I4891 Unspecified atrial fibrillation: Secondary | ICD-10-CM | POA: Diagnosis not present

## 2014-11-28 DIAGNOSIS — F329 Major depressive disorder, single episode, unspecified: Secondary | ICD-10-CM | POA: Diagnosis not present

## 2014-11-28 DIAGNOSIS — Z885 Allergy status to narcotic agent status: Secondary | ICD-10-CM | POA: Diagnosis not present

## 2014-11-28 DIAGNOSIS — Z9049 Acquired absence of other specified parts of digestive tract: Secondary | ICD-10-CM | POA: Diagnosis not present

## 2014-11-28 DIAGNOSIS — Z79899 Other long term (current) drug therapy: Secondary | ICD-10-CM | POA: Diagnosis not present

## 2014-11-28 DIAGNOSIS — K219 Gastro-esophageal reflux disease without esophagitis: Secondary | ICD-10-CM | POA: Diagnosis not present

## 2014-11-28 DIAGNOSIS — Z7901 Long term (current) use of anticoagulants: Secondary | ICD-10-CM | POA: Diagnosis not present

## 2014-11-28 DIAGNOSIS — Z853 Personal history of malignant neoplasm of breast: Secondary | ICD-10-CM | POA: Diagnosis not present

## 2014-11-28 DIAGNOSIS — K449 Diaphragmatic hernia without obstruction or gangrene: Secondary | ICD-10-CM | POA: Diagnosis not present

## 2014-11-28 DIAGNOSIS — H9193 Unspecified hearing loss, bilateral: Secondary | ICD-10-CM | POA: Diagnosis not present

## 2014-11-28 DIAGNOSIS — E780 Pure hypercholesterolemia: Secondary | ICD-10-CM | POA: Diagnosis not present

## 2014-11-28 DIAGNOSIS — Z9841 Cataract extraction status, right eye: Secondary | ICD-10-CM | POA: Diagnosis not present

## 2014-11-28 DIAGNOSIS — Z8551 Personal history of malignant neoplasm of bladder: Secondary | ICD-10-CM | POA: Diagnosis not present

## 2014-11-28 DIAGNOSIS — F419 Anxiety disorder, unspecified: Secondary | ICD-10-CM | POA: Diagnosis not present

## 2014-11-28 DIAGNOSIS — Z888 Allergy status to other drugs, medicaments and biological substances status: Secondary | ICD-10-CM | POA: Diagnosis not present

## 2014-11-28 DIAGNOSIS — Z96653 Presence of artificial knee joint, bilateral: Secondary | ICD-10-CM | POA: Diagnosis not present

## 2014-11-28 DIAGNOSIS — E079 Disorder of thyroid, unspecified: Secondary | ICD-10-CM | POA: Diagnosis not present

## 2014-11-28 DIAGNOSIS — I10 Essential (primary) hypertension: Secondary | ICD-10-CM | POA: Diagnosis not present

## 2014-11-28 DIAGNOSIS — H2512 Age-related nuclear cataract, left eye: Secondary | ICD-10-CM | POA: Diagnosis not present

## 2014-11-28 DIAGNOSIS — Z90710 Acquired absence of both cervix and uterus: Secondary | ICD-10-CM | POA: Diagnosis not present

## 2014-12-01 NOTE — H&P (Signed)
  History and physical was faxed and scanned in.   

## 2014-12-02 ENCOUNTER — Encounter
Admission: RE | Disposition: A | Discharge status: Home / Self Care | Payer: Self-pay | Source: Ambulatory Visit | Attending: Ophthalmology

## 2014-12-02 ENCOUNTER — Ambulatory Visit: Payer: Medicare Other | Admitting: Anesthesiology

## 2014-12-02 ENCOUNTER — Ambulatory Visit
Admission: RE | Admit: 2014-12-02 | Discharge: 2014-12-02 | Disposition: A | Discharge status: Home / Self Care | Payer: Medicare Other | Source: Ambulatory Visit | Attending: Ophthalmology | Admitting: Ophthalmology

## 2014-12-02 ENCOUNTER — Encounter: Payer: Self-pay | Admitting: *Deleted

## 2014-12-02 DIAGNOSIS — K449 Diaphragmatic hernia without obstruction or gangrene: Secondary | ICD-10-CM | POA: Insufficient documentation

## 2014-12-02 DIAGNOSIS — Z7901 Long term (current) use of anticoagulants: Secondary | ICD-10-CM | POA: Insufficient documentation

## 2014-12-02 DIAGNOSIS — Z8551 Personal history of malignant neoplasm of bladder: Secondary | ICD-10-CM | POA: Insufficient documentation

## 2014-12-02 DIAGNOSIS — H9193 Unspecified hearing loss, bilateral: Secondary | ICD-10-CM | POA: Insufficient documentation

## 2014-12-02 DIAGNOSIS — E079 Disorder of thyroid, unspecified: Secondary | ICD-10-CM | POA: Insufficient documentation

## 2014-12-02 DIAGNOSIS — F329 Major depressive disorder, single episode, unspecified: Secondary | ICD-10-CM | POA: Insufficient documentation

## 2014-12-02 DIAGNOSIS — Z885 Allergy status to narcotic agent status: Secondary | ICD-10-CM | POA: Insufficient documentation

## 2014-12-02 DIAGNOSIS — F419 Anxiety disorder, unspecified: Secondary | ICD-10-CM | POA: Insufficient documentation

## 2014-12-02 DIAGNOSIS — Z96653 Presence of artificial knee joint, bilateral: Secondary | ICD-10-CM | POA: Insufficient documentation

## 2014-12-02 DIAGNOSIS — I4891 Unspecified atrial fibrillation: Secondary | ICD-10-CM | POA: Insufficient documentation

## 2014-12-02 DIAGNOSIS — Z853 Personal history of malignant neoplasm of breast: Secondary | ICD-10-CM | POA: Insufficient documentation

## 2014-12-02 DIAGNOSIS — H2512 Age-related nuclear cataract, left eye: Secondary | ICD-10-CM | POA: Insufficient documentation

## 2014-12-02 DIAGNOSIS — E780 Pure hypercholesterolemia: Secondary | ICD-10-CM | POA: Insufficient documentation

## 2014-12-02 DIAGNOSIS — Z9049 Acquired absence of other specified parts of digestive tract: Secondary | ICD-10-CM | POA: Insufficient documentation

## 2014-12-02 DIAGNOSIS — Z9841 Cataract extraction status, right eye: Secondary | ICD-10-CM | POA: Insufficient documentation

## 2014-12-02 DIAGNOSIS — K219 Gastro-esophageal reflux disease without esophagitis: Secondary | ICD-10-CM | POA: Insufficient documentation

## 2014-12-02 DIAGNOSIS — Z888 Allergy status to other drugs, medicaments and biological substances status: Secondary | ICD-10-CM | POA: Insufficient documentation

## 2014-12-02 DIAGNOSIS — Z79899 Other long term (current) drug therapy: Secondary | ICD-10-CM | POA: Insufficient documentation

## 2014-12-02 DIAGNOSIS — Z9011 Acquired absence of right breast and nipple: Secondary | ICD-10-CM | POA: Insufficient documentation

## 2014-12-02 DIAGNOSIS — I10 Essential (primary) hypertension: Secondary | ICD-10-CM | POA: Insufficient documentation

## 2014-12-02 DIAGNOSIS — Z90710 Acquired absence of both cervix and uterus: Secondary | ICD-10-CM | POA: Insufficient documentation

## 2014-12-02 HISTORY — DX: Cerebral infarction, unspecified: I63.9

## 2014-12-02 HISTORY — DX: Essential (primary) hypertension: I10

## 2014-12-02 HISTORY — DX: Cardiac arrhythmia, unspecified: I49.9

## 2014-12-02 HISTORY — DX: Personal history of other diseases of the digestive system: Z87.19

## 2014-12-02 HISTORY — PX: CATARACT EXTRACTION W/PHACO: SHX586

## 2014-12-02 HISTORY — DX: Malignant (primary) neoplasm, unspecified: C80.1

## 2014-12-02 HISTORY — DX: Hypothyroidism, unspecified: E03.9

## 2014-12-02 HISTORY — DX: Gastro-esophageal reflux disease without esophagitis: K21.9

## 2014-12-02 HISTORY — DX: Unspecified hearing loss, unspecified ear: H91.90

## 2014-12-02 HISTORY — DX: Depression, unspecified: F32.A

## 2014-12-02 HISTORY — DX: Major depressive disorder, single episode, unspecified: F32.9

## 2014-12-02 HISTORY — DX: Anxiety disorder, unspecified: F41.9

## 2014-12-02 LAB — PROTIME-INR
INR: 1.62
Prothrombin Time: 19.4 seconds — ABNORMAL HIGH (ref 11.4–15.0)

## 2014-12-02 SURGERY — PHACOEMULSIFICATION, CATARACT, WITH IOL INSERTION
Anesthesia: Monitor Anesthesia Care | Site: Eye | Laterality: Left | Wound class: Clean

## 2014-12-02 MED ORDER — TETRACAINE HCL 0.5 % OP SOLN
OPHTHALMIC | Status: AC
  Filled 2014-12-02: qty 2

## 2014-12-02 MED ORDER — CYCLOPENTOLATE HCL 2 % OP SOLN
1.0000 [drp] | OPHTHALMIC | Status: AC
Start: 2014-12-02 — End: 2014-12-02
  Administered 2014-12-02 (×3): 1 [drp] via OPHTHALMIC

## 2014-12-02 MED ORDER — BUPIVACAINE HCL (PF) 0.75 % IJ SOLN
INTRAMUSCULAR | Status: AC
  Filled 2014-12-02: qty 10

## 2014-12-02 MED ORDER — CYCLOPENTOLATE HCL 2 % OP SOLN
OPHTHALMIC | Status: AC
  Administered 2014-12-02: 1 [drp] via OPHTHALMIC
  Filled 2014-12-02: qty 2

## 2014-12-02 MED ORDER — MOXIFLOXACIN HCL 0.5 % OP SOLN
1.0000 [drp] | OPHTHALMIC | Status: AC
Start: 2014-12-02 — End: 2014-12-02
  Administered 2014-12-02 (×3): 1 [drp] via OPHTHALMIC

## 2014-12-02 MED ORDER — TETRACAINE HCL 0.5 % OP SOLN
OPHTHALMIC | Status: DC | PRN
  Administered 2014-12-02: 1 [drp] via OPHTHALMIC

## 2014-12-02 MED ORDER — NA CHONDROIT SULF-NA HYALURON 40-17 MG/ML IO SOLN
INTRAOCULAR | Status: AC
Start: 2014-12-02 — End: 2014-12-03
  Filled 2014-12-02: qty 1

## 2014-12-02 MED ORDER — PHENYLEPHRINE HCL 10 % OP SOLN
OPHTHALMIC | Status: AC
  Administered 2014-12-02: 1 [drp] via OPHTHALMIC
  Filled 2014-12-02: qty 5

## 2014-12-02 MED ORDER — ALFENTANIL 500 MCG/ML IJ INJ
INJECTION | INTRAMUSCULAR | Status: DC | PRN
  Administered 2014-12-02 (×2): 250 ug via INTRAVENOUS

## 2014-12-02 MED ORDER — CARBACHOL 0.01 % IO SOLN
INTRAOCULAR | Status: DC | PRN
  Administered 2014-12-02: 0.5 mL via INTRAOCULAR

## 2014-12-02 MED ORDER — PHENYLEPHRINE HCL 10 % OP SOLN
1.0000 [drp] | OPHTHALMIC | Status: AC
Start: 2014-12-02 — End: 2014-12-02
  Administered 2014-12-02 (×4): 1 [drp] via OPHTHALMIC

## 2014-12-02 MED ORDER — CEFUROXIME OPHTHALMIC INJECTION 1 MG/0.1 ML
INJECTION | OPHTHALMIC | Status: AC
  Filled 2014-12-02: qty 0.1

## 2014-12-02 MED ORDER — LIDOCAINE HCL (PF) 4 % IJ SOLN
INTRAMUSCULAR | Status: AC
  Filled 2014-12-02: qty 5

## 2014-12-02 MED ORDER — EPINEPHRINE HCL 1 MG/ML IJ SOLN
INTRAMUSCULAR | Status: AC
  Filled 2014-12-02: qty 1

## 2014-12-02 MED ORDER — EPINEPHRINE HCL 1 MG/ML IJ SOLN
INTRAOCULAR | Status: DC | PRN
  Administered 2014-12-02: 1 mL

## 2014-12-02 MED ORDER — NA CHONDROIT SULF-NA HYALURON 40-17 MG/ML IO SOLN
INTRAOCULAR | Status: DC | PRN
  Administered 2014-12-02: 1 mL via INTRAOCULAR

## 2014-12-02 MED ORDER — CEFUROXIME SODIUM 1.5 G IJ SOLR
INTRAMUSCULAR | Status: DC | PRN
  Administered 2014-12-02: .1 g

## 2014-12-02 MED ORDER — HYALURONIDASE HUMAN 150 UNIT/ML IJ SOLN
INTRAMUSCULAR | Status: AC
  Filled 2014-12-02: qty 1

## 2014-12-02 MED ORDER — MOXIFLOXACIN HCL 0.5 % OP SOLN - NO CHARGE
OPHTHALMIC | Status: DC | PRN
  Administered 2014-12-02: 1 [drp] via OPHTHALMIC

## 2014-12-02 MED ORDER — SODIUM CHLORIDE 0.9 % IV SOLN
INTRAVENOUS | Status: DC
  Administered 2014-12-02: 09:00:00 via INTRAVENOUS

## 2014-12-02 MED ORDER — MOXIFLOXACIN HCL 0.5 % OP SOLN
OPHTHALMIC | Status: AC
  Administered 2014-12-02: 1 [drp] via OPHTHALMIC
  Filled 2014-12-02: qty 3

## 2014-12-02 SURGICAL SUPPLY — 26 items
CENTURION VISION SYSTEM ×3 IMPLANT
CORD BIP STRL DISP 12FT (MISCELLANEOUS) ×3 IMPLANT
DRAPE XRAY CASSETTE 23X24 (DRAPES) ×3 IMPLANT
ERASER HMR WETFIELD 18G (MISCELLANEOUS) ×3 IMPLANT
GLOVE BIO SURGEON STRL SZ8 (GLOVE) ×3 IMPLANT
GLOVE SURG LX 6.5 MICRO (GLOVE) ×2
GLOVE SURG LX 8.0 MICRO (GLOVE) ×2
GLOVE SURG LX STRL 6.5 MICRO (GLOVE) ×1 IMPLANT
GLOVE SURG LX STRL 8.0 MICRO (GLOVE) ×1 IMPLANT
GOWN STRL REUS W/ TWL LRG LVL3 (GOWN DISPOSABLE) ×1 IMPLANT
GOWN STRL REUS W/ TWL XL LVL3 (GOWN DISPOSABLE) ×1 IMPLANT
GOWN STRL REUS W/TWL LRG LVL3 (GOWN DISPOSABLE) ×2
GOWN STRL REUS W/TWL XL LVL3 (GOWN DISPOSABLE) ×2
LENS IOL ACRYSERT 21.0 (Intraocular Lens) ×3 IMPLANT
LENS IOL ACRYSOF IQ 21.0 (Intraocular Lens) ×3 IMPLANT
PACK CATARACT (MISCELLANEOUS) ×3 IMPLANT
PACK CATARACT DINGLEDEIN LX (MISCELLANEOUS) ×3 IMPLANT
PACK EYE AFTER SURG (MISCELLANEOUS) ×3 IMPLANT
SHLD EYE VISITEC  UNIV (MISCELLANEOUS) ×3 IMPLANT
SOL PREP PVP 2OZ (MISCELLANEOUS) ×3
SOLUTION PREP PVP 2OZ (MISCELLANEOUS) ×1 IMPLANT
SUT SILK 5-0 (SUTURE) ×3 IMPLANT
SYR 5ML LL (SYRINGE) ×3 IMPLANT
SYR TB 1ML 27GX1/2 LL (SYRINGE) ×3 IMPLANT
WATER STERILE IRR 1000ML POUR (IV SOLUTION) ×3 IMPLANT
WIPE NON LINTING 3.25X3.25 (MISCELLANEOUS) ×3 IMPLANT

## 2014-12-02 NOTE — Transfer of Care (Signed)
Immediate Anesthesia Transfer of Care Note  Patient: Gabriela [name]  Procedure(s) Performed: Procedure(s) with comments: CATARACT EXTRACTION PHACO AND INTRAOCULAR LENS PLACEMENT ([facility]) (Left) - US01:45 AP%25.7 CDE42.99  Patient Location: PACU  Anesthesia Type:MAC  Level of Consciousness: awake, alert  and oriented  Airway & Oxygen Therapy: Patient Spontanous Breathing  Post-op Assessment: Report given to RN and Post -op Vital signs reviewed and stable  Post vital signs: Reviewed and stable  Last Vitals:  Filed Vitals:   12/02/14 0710  BP: 148/86  Pulse: 75  Temp: 36.6 C  Resp: 16    Complications: No apparent anesthesia complications

## 2014-12-02 NOTE — Discharge Summary (Signed)
Patient in the care of a responsible adult. Volunteer took patient to front door and assisted in the car of the son.

## 2014-12-02 NOTE — Anesthesia Postprocedure Evaluation (Signed)
  Anesthesia Post-op Note  Patient: Gabriela [name]  Procedure(s) Performed: Procedure(s) with comments: CATARACT EXTRACTION PHACO AND INTRAOCULAR LENS PLACEMENT ([facility]) (Left) - US01:45 AP%25.7 CDE42.99  Anesthesia type:MAC  Patient location: PACU  Post pain: Pain level controlled  Post assessment: Post-op Vital signs reviewed, Patient's Cardiovascular Status Stable, Respiratory Function Stable, Patent Airway and No signs of Nausea or vomiting  Post vital signs: Reviewed and stable  Last Vitals:  Filed Vitals:   12/02/14 0710  BP: 148/86  Pulse: 75  Temp: 36.6 C  Resp: 16    Level of consciousness: awake, alert  and patient cooperative  Complications: No apparent anesthesia complications

## 2014-12-02 NOTE — Anesthesia Preprocedure Evaluation (Signed)
Anesthesia Evaluation  Patient identified by MRN, date of birth, ID band Patient awake    Reviewed: Allergy & Precautions, H&P , NPO status , Patient's Chart, lab work & pertinent test results  Airway Mallampati: II  TM Distance: >3 FB     Dental  (+) Upper Dentures, Lower Dentures   Pulmonary          Cardiovascular hypertension,     Neuro/Psych PSYCHIATRIC DISORDERS CVA    GI/Hepatic hiatal hernia, GERD-  ,  Endo/Other  Hypothyroidism   Renal/GU      Musculoskeletal   Abdominal   Peds  Hematology   Anesthesia Other Findings   Reproductive/Obstetrics                             Anesthesia Physical Anesthesia Plan  ASA: III  Anesthesia Plan: MAC   Post-op Pain Management:    Induction:   Airway Management Planned: Nasal Cannula  Additional Equipment:   Intra-op Plan:   Post-operative Plan:   Informed Consent: I have reviewed the patients History and Physical, chart, labs and discussed the procedure including the risks, benefits and alternatives for the proposed anesthesia with the patient or authorized representative who has indicated his/her understanding and acceptance.     Plan Discussed with: CRNA  Anesthesia Plan Comments:         Anesthesia Quick Evaluation

## 2014-12-02 NOTE — Interval H&P Note (Signed)
History and Physical Interval Note:  12/02/2014 7:19 AM  Gabriela [name]  has presented today for surgery, with the diagnosis of CATARACT  The various methods of treatment have been discussed with the patient and family. After consideration of risks, benefits and other options for treatment, the patient has consented to  Procedure(s): CATARACT EXTRACTION PHACO AND INTRAOCULAR LENS PLACEMENT ([place]) (Left) as a surgical intervention .  The patient's history has been reviewed, patient examined, no change in status, stable for surgery.  I have reviewed the patient's chart and labs.  Questions were answered to the patient's satisfaction.     Efrat Zuidema

## 2014-12-02 NOTE — Discharge Instructions (Addendum)
See handout.Eye Surgery Discharge Instructions ° °Expect mild scratchy sensation or mild soreness. °DO NOT RUB YOUR EYE! ° °The day of surgery: °• Minimal physical activity, but bed rest is not required °• No reading, computer work, or close hand work °• No bending, lifting, or straining. °• May watch TV ° °For 24 hours: °• No driving, legal decisions, or alcoholic beverages °• Safety precautions °• Eat anything you prefer: It is better to start with liquids, then soup then solid foods. °• _____ Eye patch should be worn until postoperative exam tomorrow. °• ____ Solar shield eyeglasses should be worn for comfort in the sunlight/patch while sleeping ° °Resume all regular medications including aspirin or Coumadin if these were discontinued prior to surgery. °You may shower, bathe, shave, or wash your hair. °Tylenol may be taken for mild discomfort. ° °Call your doctor if you experience significant pain, nausea, or vomiting, fever > 101 or other signs of infection. [phone] or [phone] °Specific instructions: ° ° °

## 2014-12-02 NOTE — Op Note (Signed)
Date of Surgery: 12/02/2014 Date of Dictation: 12/02/2014 Pre-operative Diagnosis:Nuclear Sclerotic Cataract, Posterior Subcapsular Cataract, Cortical Cataract and Mature Cataract left Eye Post-operative Diagnosis: same Procedure performed: Extra-capsular Cataract Extraction (ECCE) with placement of a posterior chamber intraocular lens (IOL) left Eye IOL: Alcon SN6CWS 21.0D Anesthesia: 2% Lidocaine and 4% Marcaine in a 50/50 mixture with 10 unites/ml of Hylenex given as a peribulbar Anesthesiologist: Dr. Marcello Moores Complications: none Estimated Blood Loss: less than 1 ml  Description of procedure:  The patient was given anesthesia and sedation via intravenous access. The patient was then prepped and draped in the usual fashion. A 25-gauge needle was bent for initiating the capsulorhexis. A 5-0 silk suture was placed through the conjunctiva superior and inferiorly to serve as bridle sutures. Hemostasis was obtained at the superior limbus using an eraser cautery. A partial thickness groove was made at the anterior surgical limbus with a 64 Beaver blade and this was dissected anteriorly with an [employer]. The anterior chamber was entered at 10 o'clock with a 1.0 mm paracentesis knife and through the lamellar dissection with a 2.6 mm Alcon keratome. DiscoVisc was injected to replace the aqueous and a continuous tear curvilinear capsulorhexis was performed using a bent 25-gauge needle.  Balance salt on a syringe was used to perform hydro-dissection and phacoemulsification was carried out using a divide and conquer technique. Total ultrasound time was 1:45. The average ultrasonic power was 25.7. The CDE was 42.99.  Irrigation/aspiration was used to remove the residual cortex and the capsular bag was inflated with DiscoVisc. The intraocular lens was inserted into the capsular bag using a pre-loaded Acrysert Delivery System. Irrigation/aspiration was used to remove the residual DiscoVisc. The wound was  inflated with balanced salt and checked for leaks. None were found. Miostat was injected via the paracentesis track and 0.1 cc of Cefuroxime containing 1 mg of drug  was injected via the paracentesis track. The wound was checked for leaks again and none were found.   The bridal sutures were removed and two drops of Vigamox were placed on the eye. An eye shield was placed to protect the eye and the patient was discharged to the recovery area in good condition.   Gabriela Gauthreaux MD

## 2014-12-05 ENCOUNTER — Encounter: Payer: Self-pay | Admitting: Ophthalmology

## 2014-12-05 NOTE — Addendum Note (Signed)
Addendum  created 12/05/14 1037 by Gunnar Bulla, MD   Modules edited: Anesthesia Attestations

## 2014-12-06 NOTE — Addendum Note (Signed)
Addendum  created 12/06/14 4332 by Gunnar Bulla, MD   Modules edited: Anesthesia Responsible Staff

## 2015-11-28 ENCOUNTER — Emergency Department
Admission: EM | Admit: 2015-11-28 | Discharge: 2015-11-28 | Disposition: A | Discharge status: Home / Self Care | Payer: Medicare Other | Attending: Emergency Medicine | Admitting: Emergency Medicine

## 2015-11-28 DIAGNOSIS — Z8551 Personal history of malignant neoplasm of bladder: Secondary | ICD-10-CM | POA: Insufficient documentation

## 2015-11-28 DIAGNOSIS — Z7901 Long term (current) use of anticoagulants: Secondary | ICD-10-CM | POA: Diagnosis not present

## 2015-11-28 DIAGNOSIS — E039 Hypothyroidism, unspecified: Secondary | ICD-10-CM | POA: Insufficient documentation

## 2015-11-28 DIAGNOSIS — Z853 Personal history of malignant neoplasm of breast: Secondary | ICD-10-CM | POA: Insufficient documentation

## 2015-11-28 DIAGNOSIS — F329 Major depressive disorder, single episode, unspecified: Secondary | ICD-10-CM | POA: Diagnosis not present

## 2015-11-28 DIAGNOSIS — Z8673 Personal history of transient ischemic attack (TIA), and cerebral infarction without residual deficits: Secondary | ICD-10-CM | POA: Diagnosis not present

## 2015-11-28 DIAGNOSIS — I10 Essential (primary) hypertension: Secondary | ICD-10-CM | POA: Diagnosis not present

## 2015-11-28 DIAGNOSIS — Z79899 Other long term (current) drug therapy: Secondary | ICD-10-CM | POA: Diagnosis not present

## 2015-11-28 DIAGNOSIS — N390 Urinary tract infection, site not specified: Secondary | ICD-10-CM | POA: Diagnosis not present

## 2015-11-28 DIAGNOSIS — R791 Abnormal coagulation profile: Secondary | ICD-10-CM | POA: Diagnosis not present

## 2015-11-28 LAB — TROPONIN I: Troponin I: <0.03 ng/mL (ref <0.031)

## 2015-11-28 LAB — BASIC METABOLIC PANEL
Anion gap: 9 (ref 5–15)
BUN: 18 mg/dL (ref 6–20)
CHLORIDE: 107 mmol/L (ref 101–111)
CO2: 22 mmol/L (ref 22–32)
CREATININE: 0.74 mg/dL (ref 0.44–1.00)
Calcium: 9 mg/dL (ref 8.9–10.3)
GFR calc Af Amer: >60 mL/min (ref >60)
GFR calc non Af Amer: >60 mL/min (ref >60)
Glucose, Bld: 121 mg/dL — ABNORMAL HIGH (ref 65–99)
Potassium: 3.6 mmol/L (ref 3.5–5.1)
SODIUM: 138 mmol/L (ref 135–145)

## 2015-11-28 LAB — URINALYSIS COMPLETE WITH MICROSCOPIC (ARMC ONLY)
BILIRUBIN URINE: NEGATIVE
GLUCOSE, UA: NEGATIVE mg/dL
KETONES UR: NEGATIVE mg/dL
NITRITE: POSITIVE — AB
Protein, ur: 30 mg/dL — AB
SPECIFIC GRAVITY, URINE: 1.014 (ref 1.005–1.030)
pH: 5 (ref 5.0–8.0)

## 2015-11-28 LAB — GLUCOSE, CAPILLARY: Glucose-Capillary: 118 mg/dL — ABNORMAL HIGH (ref 65–99)

## 2015-11-28 LAB — CBC
HCT: 35.4 % (ref 35.0–47.0)
HEMOGLOBIN: 11.6 g/dL — AB (ref 12.0–16.0)
MCH: 28.2 pg (ref 26.0–34.0)
MCHC: 32.6 g/dL (ref 32.0–36.0)
MCV: 86.4 fL (ref 80.0–100.0)
PLATELETS: 244 10*3/uL (ref 150–440)
RBC: 4.1 MIL/uL (ref 3.80–5.20)
RDW: 15.1 % — ABNORMAL HIGH (ref 11.5–14.5)
WBC: 8.7 10*3/uL (ref 3.6–11.0)

## 2015-11-28 LAB — PROTIME-INR
INR: 1.85
Prothrombin Time: 21.3 seconds — ABNORMAL HIGH (ref 11.4–15.0)

## 2015-11-28 MED ORDER — CEPHALEXIN 500 MG PO CAPS
500.0000 mg | ORAL_CAPSULE | Freq: Once | ORAL | Status: AC
  Administered 2015-11-28: 500 mg via ORAL
  Filled 2015-11-28: qty 1

## 2015-11-28 MED ORDER — CEPHALEXIN 500 MG PO CAPS: 500.0000 mg | ORAL_CAPSULE | Freq: Two times a day (BID) | ORAL | Status: DC

## 2015-11-28 NOTE — ED Notes (Signed)

## 2015-11-28 NOTE — ED Notes (Signed)
Patient ambulatory to triage with steady gait, without difficulty or distress noted; pt reports "shaking" tonight; denies any accomp symptoms; but st "I'm wondering about a UTI"; denies pain

## 2015-11-28 NOTE — ED Provider Notes (Signed)
[facility] Emergency Department Provider Note   ____________________________________________  Time seen: Approximately 2 AM  I have reviewed the triage vital signs and the nursing notes.   HISTORY  Chief Complaint No chief complaint on file.   HPI Gabriela [name] is a 82 y.o. female who is presenting with "shakes" earlier tonight. She says that the shakes it started. She denies any fever home. Says now that she just feels weak all over. Denies any burning with urination. Says that she is a mild frontal headache but gets these often. No change from her normal headache pattern. Is concerned because she has a history of UTI and thinks she may have one at this time.   Past Medical History  Diagnosis Date  . Hypertension   . Dysrhythmia     afib  . Stroke     2010  . GERD (gastroesophageal reflux disease)   . History of hiatal hernia   . HOH (hard of hearing)     aids  . Hypothyroidism   . Cancer     breast,bladder  . Depression   . Anxiety     There are no active problems to display for this patient.   Past Surgical History  Procedure Laterality Date  . Abdominal hysterectomy    . Joint replacement      bil tkr  . Cholecystectomy    . Bladder surgery    . Ankle arthodesis w/ arthroscopy    . Mastectomy      partial right  . Eye surgery      cataract  . Cataract extraction w/phaco Left 12/02/2014    Procedure: CATARACT EXTRACTION PHACO AND INTRAOCULAR LENS PLACEMENT ([facility]);  Surgeon: Estill Cotta, MD;  Location: [facility] ORS;  Service: Ophthalmology;  Laterality: Left;  US01:45 AP%25.7 CDE42.99    Current Outpatient Rx  Name  Route  Sig  Dispense  Refill  . ALPRAZolam (XANAX) 0.25 MG tablet   Oral   Take 0.125 mg by mouth at bedtime as needed for anxiety.         Marland Kitchen diltiazem (TIAZAC) 240 MG 24 hr capsule   Oral   Take 240 mg by mouth daily. am         . levothyroxine (SYNTHROID, LEVOTHROID) 112 MCG tablet   Oral   Take 112  mcg by mouth daily before breakfast.         . omeprazole (PRILOSEC) 20 MG capsule   Oral   Take 20 mg by mouth daily.         . sertraline (ZOLOFT) 50 MG tablet   Oral   Take 50 mg by mouth daily. hs         . simvastatin (ZOCOR) 10 MG tablet   Oral   Take 10 mg by mouth daily. hs         . warfarin (COUMADIN) 3 MG tablet   Oral   Take 3 mg by mouth daily. Hs 5 days a week         . warfarin (COUMADIN) 4 MG tablet   Oral   Take 4 mg by mouth daily. Hs 2 days a week           Allergies Celebrex and Morphine and related  No family history on file.  Social History Social History  Substance Use Topics  . Smoking status: Never Smoker   . Smokeless tobacco: Not on file  . Alcohol Use: No    Review of Systems  Constitutional: No fever/chills Eyes: No visual changes. ENT: No sore throat. Cardiovascular: Denies chest pain. Respiratory: Denies shortness of breath. Gastrointestinal: No abdominal pain.  No nausea, no vomiting.  No diarrhea.  No constipation. Genitourinary: Negative for dysuria. Musculoskeletal: Negative for back pain. Skin: Negative for rash. Neurological: Negative for focal weakness or numbness.  10-point ROS otherwise negative.  ____________________________________________   PHYSICAL EXAM:  VITAL SIGNS: ED Triage Vitals  Enc Vitals Group     BP 11/28/15 0116 208/150 mmHg     Pulse Rate 11/28/15 0116 108     Resp 11/28/15 0116 18     Temp 11/28/15 0116 99.6 F (37.6 C)     Temp Source 11/28/15 0116 Oral     SpO2 11/28/15 0116 97 %     Weight 11/28/15 0116 172 lb (78.019 kg)     Height 11/28/15 0116 5\' 3"  (1.6 m)     Head Cir --      Peak Flow --      Pain Score 11/28/15 0128 0     Pain Loc --      Pain Edu? --      Excl. in [place]? --     Constitutional: Alert and oriented. Well appearing and in no acute distress. Eyes: Conjunctivae are normal. PERRL. EOMI. Head: Atraumatic. Nose: No congestion/rhinnorhea. Mouth/Throat:  Mucous membranes are moist.   Neck: No stridor.   Cardiovascular: Irregularly irregular. Grossly normal heart sounds.   Respiratory: Normal respiratory effort.  No retractions. Lungs CTAB. Gastrointestinal: Soft and nontender. No distention. No abdominal bruits. No CVA tenderness. Musculoskeletal: No lower extremity tenderness nor edema.  No joint effusions. Neurologic:  Normal speech and language. No gross focal neurologic deficits are appreciated.  Skin:  Skin is warm, dry and intact. No rash noted. Psychiatric: Mood and affect are normal. Speech and behavior are normal.  ____________________________________________   LABS (all labs ordered are listed, but only abnormal results are displayed)  Labs Reviewed  CBC - Abnormal; Notable for the following:    Hemoglobin 11.6 (*)    RDW 15.1 (*)    All other components within normal limits  BASIC METABOLIC PANEL - Abnormal; Notable for the following:    Glucose, Bld 121 (*)    All other components within normal limits  GLUCOSE, CAPILLARY - Abnormal; Notable for the following:    Glucose-Capillary 118 (*)    All other components within normal limits  URINALYSIS COMPLETEWITH MICROSCOPIC ([facility] ONLY) - Abnormal; Notable for the following:    Color, Urine YELLOW (*)    APPearance HAZY (*)    Hgb urine dipstick 2+ (*)    Protein, ur 30 (*)    Nitrite POSITIVE (*)    Leukocytes, UA TRACE (*)    Bacteria, UA MANY (*)    Squamous Epithelial / LPF 0-5 (*)    All other components within normal limits  PROTIME-INR - Abnormal; Notable for the following:    Prothrombin Time 21.3 (*)    All other components within normal limits  URINE CULTURE  TROPONIN I  CBG MONITORING, ED   ____________________________________________  EKG  ED ECG REPORT I, Doran Stabler, the attending physician, personally viewed and interpreted this ECG.   Date: 11/28/2015  EKG Time: 1:20 AM  Rate: 112  Rhythm: atrial fibrillation, rate 112  Axis:  Normal  Intervals:none  ST&T Change: No ST elevation or depression. T-wave inversions in 1, V5 and V6 without any significant change from the EKG from 11/13/2012.  ____________________________________________  RADIOLOGY   ____________________________________________   PROCEDURES   ____________________________________________   INITIAL IMPRESSION / ASSESSMENT AND PLAN / ED COURSE  Pertinent labs & imaging results that were available during my care of the patient were reviewed by me and considered in my medical decision making (see chart for details).  ----------------------------------------- 2:49 AM on 11/28/2015 -----------------------------------------  Patient resting comfortably at this time. Urine appears positive for UTI. Subtherapeutic INR. I discussed the lab results patient says that she has been eating some vegetables which may be vitamin K containing. She says that she will cut these out of her diet, including cabbage. We'll treat her UTI with Keflex. Has had almost pansensitive bacteria grown in the past. ____________________________________________   FINAL CLINICAL IMPRESSION(S) / ED DIAGNOSES  UTI. sub therapeutic INR.    NEW MEDICATIONS STARTED DURING THIS VISIT:  New Prescriptions   No medications on file     Note:  This document was prepared using Dragon voice recognition software and may include unintentional dictation errors.    Orbie Pyo, MD 11/28/15 [phone]

## 2015-11-28 NOTE — ED Notes (Signed)
Pt presents to ED with c/o "shaking," headache, and possible UTI. Pt reports had generalized pain all over last night and took a percocet at 9 pm last night. Reports woke up approx 12:30 this morning and was shaking all over. Pt denies chest pain, fever, abdominal pain, blurry vision, or urinary symptoms. Pt reports having headache since last night. Pt alert and oriented x 4, no increased work in breathing noted, skin warm and dry. Pt a-fib on monitor, has hx of a-fib and HTN. Call bell within reach. Will continue to monitor patient.

## 2015-12-02 LAB — URINE CULTURE: Culture: >=100000 — AB

## 2015-12-03 NOTE — Progress Notes (Signed)
ED Discharge Culture Review:  83 yo F with ER visit on 11/28/15 with ?UTI.  Patient discharged on  Cephalexin.  11/28/15 Urine cx:  >100,000 E.coli Resistant to Cefazolin,ampicillin, Unasyn, Ciprofloxacin. Intermediate to Zosyn. Sensitive to Ceftriaxone, Gentamicin, Imipenem, Nitrofurantion, Septra.  Allergies: Celebrex (anaphylaxis)-(unable to use Septra), morphine  Per Dr. Kerman Passey, will order new RX for Cefdinir (Omnicef) 300mg  po BID x 7 days.  12/03/15- Attempted to contact patient to determine pharmacy preference- left voice mail message.   Chinita Greenland PharmD Clinical Pharmacist 12/03/2015

## 2015-12-04 NOTE — Progress Notes (Signed)
ED Discharge Culture Review:  82 yo F with ER visit on 11/28/15 with ?UTI.  Patient discharged on  Cephalexin.  11/28/15 Urine cx:  >100,000 E.coli Resistant to Cefazolin,ampicillin, Unasyn, Ciprofloxacin. Intermediate to Zosyn. Sensitive to Ceftriaxone, Gentamicin, Imipenem, Nitrofurantion, Septra.  Allergies: Celebrex (anaphylaxis)-(unable to use Septra), morphine  Per Dr. Kerman Passey, will order new RX for Cefdinir (Omnicef) 300mg  po BID x 7 days.  12/03/15- Attempted to contact patient to determine pharmacy preference- left voice mail message.  12/04/15- Patient reported preference for prescription to be called in to [facility] in [place] in RX with instructions to d/c current prescription for Keflex at [place], PharmD Clinical Pharmacist   12/04/2015

## 2016-07-08 ENCOUNTER — Telehealth (INDEPENDENT_AMBULATORY_CARE_PROVIDER_SITE_OTHER): Payer: Self-pay

## 2016-07-12 ENCOUNTER — Ambulatory Visit (INDEPENDENT_AMBULATORY_CARE_PROVIDER_SITE_OTHER): Payer: Medicare Other | Admitting: Physical Medicine and Rehabilitation

## 2016-07-12 ENCOUNTER — Encounter (INDEPENDENT_AMBULATORY_CARE_PROVIDER_SITE_OTHER): Payer: Self-pay | Admitting: Physical Medicine and Rehabilitation

## 2016-07-12 VITALS — BP 143/76 | HR 65

## 2016-07-12 DIAGNOSIS — M461 Sacroiliitis, not elsewhere classified: Secondary | ICD-10-CM | POA: Diagnosis not present

## 2016-07-12 DIAGNOSIS — M25551 Pain in right hip: Secondary | ICD-10-CM

## 2016-07-12 MED ORDER — TRIAMCINOLONE ACETONIDE 40 MG/ML IJ SUSP
80.0000 mg | INTRAMUSCULAR | Status: AC | PRN
  Administered 2016-07-12: 80 mg via INTRA_ARTICULAR

## 2016-07-12 MED ORDER — LIDOCAINE HCL 2 % IJ SOLN
4.0000 mL | INTRAMUSCULAR | Status: AC | PRN
  Administered 2016-07-12: 4 mL

## 2016-07-12 NOTE — Patient Instructions (Signed)

## 2016-07-12 NOTE — Progress Notes (Signed)
Gabriela [name] - 82 y.o. female MRN [id number]  Date of birth: [birth year]  Office Visit Note: Visit Date: 07/12/2016 PCP: Gabriela Rana, MD Referred by: Gabriela Rana, MD  Subjective: Chief Complaint  Patient presents with  . Lower Back - Pain  . Right Hip - Pain   HPI: Gabriela [name] is a pleasant but hard of hearing 82 year old female who comes in today with her daughter who is a nurse at the surgery center or [facility] to do some procedures and I know quite well. The patient complains of chronic worsening lower back radiating into right lateral and anterior hip for approximately 3 months. She reports some radiating into groin also with some radiation down to knee. Worse with walking and standing. Some relief with siting. Takes tylenol for relief which helps some. She was evaluated in [place] by Gabriela Peper, PA at the orthopedic repair. They suggested sacroiliac joint injection by Dr. Mancel [name]. Evidently he was not able to see her until sometime in January so the patient's daughter contacted [place] for possible injection. The patient denies any left-sided complaints. She denies any radicular pain or paresthesias. She's had no focal weakness. She has not had specific physical therapy. She had had x-rays of her lumbar spine. 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 She has not had prior spinal injections or spine surgery or hip surgery. She has a negative Fortin finger test. She rates her pain is quite severe and limiting.    Review of Systems  Constitutional: Negative for chills, fever, malaise/fatigue and weight loss.  HENT: Positive for hearing loss. Negative for sinus pain.   Eyes: Negative for blurred vision, double vision and photophobia.  Respiratory: Negative for cough and shortness of breath.   Cardiovascular: Negative for chest pain, palpitations and leg swelling.  Gastrointestinal: Negative for abdominal pain, nausea and vomiting.  Genitourinary: Negative for flank pain.    Musculoskeletal: Positive for back pain and joint pain. Negative for myalgias.  Skin: Negative for itching and rash.  Neurological: Negative for tremors, focal weakness and weakness.  Endo/Heme/Allergies: Negative.   Psychiatric/Behavioral: Negative for depression.  All other systems reviewed and are negative.  Otherwise per HPI.  Assessment & Plan: Visit Diagnoses:  1. Sacroiliitis ([place])   2. Pain in right hip     Plan: Findings:  Clinical scenario and exam seem to point more towards right hip pathology. This may declare itself a little bit more since she saw the physician assistant. She does get pain with internal rotation and it is more her anterolateral pain than true groin pain. She has decent range of motion of the hip. She has a negative Fortin finger test. I think the best approach is a diagnostic and hopefully therapeutic anesthetic hip arthrogram. We did complete the injection today and she did well during the anesthetic phase with decreased pain on rotation and with ambulation. If she doesn't do well she can follow up with them back in [place] and they could look at either this being a spine source or a sacroiliac joint problem indeed. I'll be happy to see her back for that. She does want to follow-up there in [place] as it would be easier. Dr. Mancel [name] does a very good job and I have met him on several occasions.    Meds & Orders: No orders of the defined types were placed in this encounter.   Orders Placed This Encounter  Procedures  . Large Joint Injection/Arthrocentesis    Follow-up: Return if  symptoms worsen or fail to improve, 2 weeks, for Gabriela [name], [place].   Procedures: Large Joint Inj Date/Time: 07/12/2016 9:35 AM Performed by: Gabriela [name] Authorized by: Gabriela [name]   Consent Given by:  Patient Site marked: the procedure site was marked   Timeout: prior to procedure the correct patient, procedure, and site was verified   Indications:   Pain and diagnostic evaluation Location:  Hip Site:  R hip joint Prep: patient was prepped and draped in usual sterile fashion   Needle Size:  22 G Needle Length:  3.5 inches Approach:  Anterior Ultrasound Guidance: No   Fluoroscopic Guidance: No   Arthrogram: Yes   Medications:  4 mL lidocaine 2 %; 80 mg triamcinolone acetonide 40 MG/ML Aspiration Attempted: Yes   Patient tolerance:  Patient tolerated the procedure well with no immediate complications  Arthrogram demonstrated excellent flow of contrast throughout the joint surface without extravasation or obvious defect.  The patient had relief of symptoms during the anesthetic phase of the injection.     No notes on file   Clinical History: No specialty comments available.  She reports that she has never smoked. She has never used smokeless tobacco. No results for input(s): HGBA1C, LABURIC in the last 8760 hours.  Objective:  VS:  HT:    WT:   BMI:     BP:(!) 143/76  HR:65bpm  TEMP: ( )  RESP:  Physical Exam  Constitutional: She is oriented to person, place, and time. She appears well-developed and well-nourished. No distress.  HENT:  Head: Normocephalic and atraumatic.  Nose: Nose normal.  Mouth/Throat: Oropharynx is clear and moist.  Eyes: Conjunctivae are normal. Pupils are equal, round, and reactive to light.  Neck: Normal range of motion. Neck supple.  Cardiovascular: Regular rhythm and intact distal pulses.   Pulmonary/Chest: Effort normal and breath sounds normal.  Abdominal: She exhibits no distension.  Musculoskeletal:  The patient ambulates without aid. She does have internal rotation pain of the right hip with more anterior lateral pain with some groin pain. Decent range of motion. She has no pain over the PSIS. She does have pain over the greater trochanter. No findings on the left. Good distal strength.  Neurological: She is alert and oriented to person, place, and time.  Skin: Skin is warm. No rash noted.  No erythema.  Psychiatric: She has a normal mood and affect. Her behavior is normal.  Nursing note and vitals reviewed.   Ortho Exam Imaging: No results found.  Past Medical/Family/Surgical/Social History: Medications & Allergies reviewed per EMR Patient Active Problem List   Diagnosis Date Noted  . Sacroiliitis ([place]) 07/12/2016  . Pain in right hip 07/12/2016   Past Medical History:  Diagnosis Date  . Anxiety   . Cancer ([place])    breast,bladder  . Depression   . Dysrhythmia    afib  . GERD (gastroesophageal reflux disease)   . History of hiatal hernia   . HOH (hard of hearing)    aids  . Hypertension   . Hypothyroidism   . Stroke [facility])    2010   History reviewed. No pertinent family history. Past Surgical History:  Procedure Laterality Date  . ABDOMINAL HYSTERECTOMY    . ANKLE ARTHODESIS W/ ARTHROSCOPY    . BLADDER SURGERY    . CATARACT EXTRACTION W/PHACO Left 12/02/2014   Procedure: CATARACT EXTRACTION PHACO AND INTRAOCULAR LENS PLACEMENT (IOC);  Surgeon: Estill Cotta, MD;  Location: [facility] ORS;  Service: Ophthalmology;  Laterality: Left;  US01:45 AP%25.7 CDE42.99  . CHOLECYSTECTOMY    . EYE SURGERY     cataract  . JOINT REPLACEMENT     bil tkr  . MASTECTOMY     partial right   Social History   Occupational History  . Not on file.   Social History Main Topics  . Smoking status: Never Smoker  . Smokeless tobacco: Never Used  . Alcohol use No  . Drug use: Unknown  . Sexual activity: Not on file

## 2016-07-15 NOTE — Telephone Encounter (Signed)
This pt has been scheduled.

## 2016-11-17 ENCOUNTER — Ambulatory Visit: Payer: Self-pay

## 2017-04-06 ENCOUNTER — Emergency Department
Admission: EM | Admit: 2017-04-06 | Discharge: 2017-04-07 | Disposition: A | Discharge status: Home / Self Care | Payer: Medicare Other | Attending: Emergency Medicine | Admitting: Emergency Medicine

## 2017-04-06 ENCOUNTER — Emergency Department: Payer: Medicare Other

## 2017-04-06 ENCOUNTER — Encounter: Payer: Self-pay | Admitting: Emergency Medicine

## 2017-04-06 DIAGNOSIS — Z79899 Other long term (current) drug therapy: Secondary | ICD-10-CM | POA: Insufficient documentation

## 2017-04-06 DIAGNOSIS — E039 Hypothyroidism, unspecified: Secondary | ICD-10-CM | POA: Insufficient documentation

## 2017-04-06 DIAGNOSIS — I10 Essential (primary) hypertension: Secondary | ICD-10-CM | POA: Insufficient documentation

## 2017-04-06 DIAGNOSIS — Z96653 Presence of artificial knee joint, bilateral: Secondary | ICD-10-CM | POA: Diagnosis not present

## 2017-04-06 DIAGNOSIS — R110 Nausea: Secondary | ICD-10-CM | POA: Insufficient documentation

## 2017-04-06 DIAGNOSIS — Z8551 Personal history of malignant neoplasm of bladder: Secondary | ICD-10-CM | POA: Insufficient documentation

## 2017-04-06 DIAGNOSIS — R42 Dizziness and giddiness: Secondary | ICD-10-CM

## 2017-04-06 DIAGNOSIS — Z853 Personal history of malignant neoplasm of breast: Secondary | ICD-10-CM | POA: Diagnosis not present

## 2017-04-06 MED ORDER — ONDANSETRON HCL 4 MG/2ML IJ SOLN
INTRAMUSCULAR | Status: AC
  Administered 2017-04-07: 4 mg via INTRAVENOUS
  Filled 2017-04-06: qty 2

## 2017-04-06 NOTE — ED Triage Notes (Addendum)
Pt to triage via w/c with no distress noted; pt reports dizziness accomp by nausea since this afternoon; daughter reports pt did fall couple weeks ago hitting back of head on dresser; pt denies LOC or c/o HA since

## 2017-04-06 NOTE — ED Notes (Signed)
Patient transported to CT 

## 2017-04-07 LAB — CBC
HCT: 30.5 % — ABNORMAL LOW (ref 35.0–47.0)
HEMOGLOBIN: 9.8 g/dL — AB (ref 12.0–16.0)
MCH: 24.7 pg — AB (ref 26.0–34.0)
MCHC: 32.3 g/dL (ref 32.0–36.0)
MCV: 76.4 fL — ABNORMAL LOW (ref 80.0–100.0)
PLATELETS: 320 10*3/uL (ref 150–440)
RBC: 3.99 MIL/uL (ref 3.80–5.20)
RDW: 16.9 % — ABNORMAL HIGH (ref 11.5–14.5)
WBC: 7.1 10*3/uL (ref 3.6–11.0)

## 2017-04-07 LAB — URINALYSIS, COMPLETE (UACMP) WITH MICROSCOPIC
Bacteria, UA: NONE SEEN
Bilirubin Urine: NEGATIVE
GLUCOSE, UA: NEGATIVE mg/dL
Ketones, ur: NEGATIVE mg/dL
Leukocytes, UA: NEGATIVE
NITRITE: NEGATIVE
Protein, ur: NEGATIVE mg/dL
SPECIFIC GRAVITY, URINE: 1.015 (ref 1.005–1.030)
SQUAMOUS EPITHELIAL / LPF: NONE SEEN
WBC, UA: NONE SEEN WBC/hpf (ref 0–5)
pH: 7.5 (ref 5.0–8.0)

## 2017-04-07 LAB — BASIC METABOLIC PANEL
ANION GAP: 8 (ref 5–15)
BUN: 13 mg/dL (ref 6–20)
CALCIUM: 9.1 mg/dL (ref 8.9–10.3)
CO2: 25 mmol/L (ref 22–32)
CREATININE: 0.62 mg/dL (ref 0.44–1.00)
Chloride: 105 mmol/L (ref 101–111)
Glucose, Bld: 165 mg/dL — ABNORMAL HIGH (ref 65–99)
Potassium: 3.1 mmol/L — ABNORMAL LOW (ref 3.5–5.1)
Sodium: 138 mmol/L (ref 135–145)

## 2017-04-07 LAB — PROTIME-INR
INR: 1.96
PROTHROMBIN TIME: 22.2 s — AB (ref 11.4–15.2)

## 2017-04-07 LAB — TROPONIN I

## 2017-04-07 MED ORDER — MECLIZINE HCL 25 MG PO TABS: 25.0000 mg | ORAL_TABLET | Freq: Three times a day (TID) | ORAL | 0 refills | Status: DC | PRN

## 2017-04-07 MED ORDER — ONDANSETRON 4 MG PO TBDP: 4.0000 mg | ORAL_TABLET | Freq: Three times a day (TID) | ORAL | 0 refills | Status: DC | PRN

## 2017-04-07 MED ORDER — ONDANSETRON HCL 4 MG/2ML IJ SOLN
4.0000 mg | Freq: Once | INTRAMUSCULAR | Status: AC
  Administered 2017-04-07: 4 mg via INTRAVENOUS

## 2017-04-07 MED ORDER — SODIUM CHLORIDE 0.9 % IV BOLUS (SEPSIS)
500.0000 mL | Freq: Once | INTRAVENOUS | Status: AC
  Administered 2017-04-07: 500 mL via INTRAVENOUS

## 2017-04-07 MED ORDER — MECLIZINE HCL 25 MG PO TABS
25.0000 mg | ORAL_TABLET | Freq: Once | ORAL | Status: AC
  Administered 2017-04-07: 25 mg via ORAL
  Filled 2017-04-07: qty 1

## 2017-04-07 MED ORDER — POTASSIUM CHLORIDE CRYS ER 20 MEQ PO TBCR
40.0000 meq | EXTENDED_RELEASE_TABLET | Freq: Once | ORAL | Status: AC
  Administered 2017-04-07: 40 meq via ORAL
  Filled 2017-04-07: qty 2

## 2017-04-07 NOTE — Discharge Instructions (Signed)
1. You may take medicines as needed for dizziness and nausea (Meclizine/Zofran #20). 2. Return to the ER for worsening symptoms, persistent vomiting, difficulty breathing or other concerns.

## 2017-04-07 NOTE — ED Provider Notes (Signed)
[facility] Emergency Department Provider Note   ____________________________________________   First MD Initiated Contact with Patient 04/07/17 0020     (approximate)  I have reviewed the triage vital signs and the nursing notes.   HISTORY  Chief Complaint Dizziness    HPI Gabriela [name] is a 83 y.o. female who presents to the ED from home with a chief complaint of dizziness. Patient reports onset of dizziness while she was swinging in her front porch swing yesterday afternoon approximately 3 PM.Describes sensation of motion which is exacerbated by standing and turning her head. Denies sensation of lightheadedness. Symptoms associated with nausea and dry heaving. Denies recent fever, chills, sinus issues, chest pain, shortness of breath, abdominal pain, dysuria, diarrhea. Denies recent travel or trauma. Daughter states 2 weeks ago patient had mechanical fall and struck the back of her head. Patient is on Coumadin for atrial fibrillation.  Past Medical History:  Diagnosis Date  . Anxiety   . Cancer ([place])    breast,bladder  . Depression   . Dysrhythmia    afib  . GERD (gastroesophageal reflux disease)   . History of hiatal hernia   . HOH (hard of hearing)    aids  . Hypertension   . Hypothyroidism   . Stroke [facility])    2010    Patient Active Problem List   Diagnosis Date Noted  . Sacroiliitis ([place]) 07/12/2016  . Pain in right hip 07/12/2016    Past Surgical History:  Procedure Laterality Date  . ABDOMINAL HYSTERECTOMY    . ANKLE ARTHODESIS W/ ARTHROSCOPY    . BLADDER SURGERY    . CATARACT EXTRACTION W/PHACO Left 12/02/2014   Procedure: CATARACT EXTRACTION PHACO AND INTRAOCULAR LENS PLACEMENT (IOC);  Surgeon: Estill Cotta, MD;  Location: [facility] ORS;  Service: Ophthalmology;  Laterality: Left;  US01:45 AP%25.7 CDE42.99  . CHOLECYSTECTOMY    . EYE SURGERY     cataract  . JOINT REPLACEMENT     bil tkr  . MASTECTOMY     partial right     Prior to Admission medications   Medication Sig Start Date End Date Taking? Authorizing Provider  ALPRAZolam (XANAX) 0.25 MG tablet Take 0.125 mg by mouth at bedtime as needed for anxiety.    [provider]  cephALEXin (KEFLEX) 500 MG capsule Take 1 capsule (500 mg total) by mouth 2 (two) times daily. Patient not taking: Reported on 07/12/2016 11/28/15   Orbie Pyo, MD  diltiazem [facility]) 240 MG 24 hr capsule Take 240 mg by mouth daily. am    [provider]  levothyroxine (SYNTHROID, LEVOTHROID) 112 MCG tablet Take 112 mcg by mouth daily before breakfast.    [provider]  omeprazole (PRILOSEC) 20 MG capsule Take 20 mg by mouth daily.    [provider]  sertraline (ZOLOFT) 50 MG tablet Take 50 mg by mouth daily. hs    [provider]  simvastatin (ZOCOR) 10 MG tablet Take 10 mg by mouth daily. hs    [provider]  warfarin (COUMADIN) 3 MG tablet Take 3 mg by mouth daily. Hs 5 days a week    [provider]  warfarin (COUMADIN) 4 MG tablet Take 4 mg by mouth daily. Hs 2 days a week    [provider]    Allergies Celebrex [celecoxib]; Morphine and related; and Sulfa antibiotics  No family history on file.  Social History Social History  Substance Use Topics  . Smoking status: Never Smoker  .  Smokeless tobacco: Never Used  . Alcohol use No    Review of Systems  Constitutional: No fever/chills. Eyes: No visual changes. ENT: No sore throat. Cardiovascular: Denies chest pain. Respiratory: Denies shortness of breath. Gastrointestinal: No abdominal pain.  positive for nausea, no vomiting.  No diarrhea.  No constipation. Genitourinary: Negative for dysuria. Musculoskeletal: Negative for back pain. Skin: Negative for rash. Neurological: positive for dizziness. Negative for headaches, focal weakness or numbness.   ____________________________________________   PHYSICAL EXAM:  VITAL  SIGNS: ED Triage Vitals [04/06/17 2324]  Enc Vitals Group     BP (!) 145/75     Pulse Rate 71     Resp 20     Temp (!) 97.5 F (36.4 C)     Temp Source Oral     SpO2 97 %     Weight 165 lb (74.8 kg)     Height 5\' 3"  (1.6 m)     Head Circumference      Peak Flow      Pain Score      Pain Loc      Pain Edu?      Excl. in [place]?     Constitutional: Alert and oriented. Well appearing and in no acute distress. Eyes: Conjunctivae are normal. PERRL. EOMI. Head: Atraumatic. Nose: No congestion/rhinnorhea. Mouth/Throat: Mucous membranes are moist.  Oropharynx non-erythematous. Neck: No stridor.  No carotid bruits. Cardiovascular: Normal rate, irregular rhythm. Grossly normal heart sounds.  Good peripheral circulation. Respiratory: Normal respiratory effort.  No retractions. Lungs CTAB. Gastrointestinal: Soft and nontender. No distention. No abdominal bruits. No CVA tenderness. Musculoskeletal: No lower extremity tenderness nor edema.  No joint effusions. Neurologic:  Alert and oriented 3. CN II-XII grossly intact. Normal speech and language. No gross focal neurologic deficits are appreciated.  Skin:  Skin is warm, dry and intact. No rash noted. Psychiatric: Mood and affect are normal. Speech and behavior are normal.  ____________________________________________   LABS (all labs ordered are listed, but only abnormal results are displayed)  Labs Reviewed  CBC - Abnormal; Notable for the following:       Result Value   Hemoglobin 9.8 (*)    HCT 30.5 (*)    MCV 76.4 (*)    MCH 24.7 (*)    RDW 16.9 (*)    All other components within normal limits  BASIC METABOLIC PANEL  URINALYSIS, COMPLETE (UACMP) WITH MICROSCOPIC  TROPONIN I  PROTIME-INR  CBG MONITORING, ED   ____________________________________________  EKG  ED ECG REPORT I, Varie Machamer J, the attending physician, personally viewed and interpreted this ECG.   Date: 04/07/2017  EKG Time: 2328  Rate: 80  Rhythm: atrial  fibrillation, rate 80  Axis: normal  Intervals:none  ST&T Change: nonspecific  ____________________________________________  RADIOLOGY  Ct Head Wo Contrast  Result Date: 04/07/2017 CLINICAL DATA:  Dizzy. Fall 2 weeks ago striking head. On anticoagulation. EXAM: CT HEAD WITHOUT CONTRAST TECHNIQUE: Contiguous axial images were obtained from the base of the skull through the vertex without intravenous contrast. COMPARISON:  Head CT 08/17/2011 FINDINGS: Brain: Unchanged encephalomalacia in the left frontal lobe. Stable atrophy and chronic small vessel ischemia elsewhere. No intracranial hemorrhage, mass effect, or midline shift. No hydrocephalus. Incidental cavum septum pellucidum. The basilar cisterns are patent. No evidence of territorial infarct or acute ischemia. No extra-axial or intracranial fluid collection. Vascular: Hyperdense vessel. Skull: No skull fracture. Unchanged 5.5 cm right frontoparietal bone lesion likely hemangioma. No change from prior exam. Sinuses/Orbits: Paranasal sinuses and mastoid air  cells are clear. The visualized orbits are unremarkable. Other: None. IMPRESSION: 1.  No acute intracranial abnormality. 2. Stable chronic small vessel ischemia and remote left frontal lobe infarct. Electronically Signed   By: Jeb Levering M.D.   On: 04/07/2017 00:27    ____________________________________________   PROCEDURES  Procedure(s) performed: None  Procedures  Critical Care performed: No  ____________________________________________   INITIAL IMPRESSION / ASSESSMENT AND PLAN / ED COURSE  Pertinent labs & imaging results that were available during my care of the patient were reviewed by me and considered in my medical decision making (see chart for details).  83 year old female who presents with dizziness and unsteady gait. Symptoms suggestive of vertigo. No focal neurological deficits. Will infuse IV fluids, meclizine, Zofran and reassess. Consider MRI brain if no  improvement in symptoms.  Clinical Course as of Apr 07 626  Thu Apr 07, 2017  0200 Patient is feeling much better. Ambulated with steady gait to commode. No focal neurological deficits on exam. Will replete potassium. Encouraged patient to drink plenty of fluids daily. Strict return precautions given. Patient and daughter verbalize understanding and agree with plan of care.  [JS]    Clinical Course User Index [JS] Paulette Blanch, MD     ____________________________________________   FINAL CLINICAL IMPRESSION(S) / ED DIAGNOSES  Final diagnoses:  Dizziness      NEW MEDICATIONS STARTED DURING THIS VISIT:  New Prescriptions   No medications on file     Note:  This document was prepared using Dragon voice recognition software and may include unintentional dictation errors.    Paulette Blanch, MD 04/07/17 [phone]

## 2017-04-25 ENCOUNTER — Other Ambulatory Visit: Payer: Self-pay | Admitting: Family Medicine

## 2017-04-25 DIAGNOSIS — I10 Essential (primary) hypertension: Secondary | ICD-10-CM

## 2017-04-25 DIAGNOSIS — R1010 Upper abdominal pain, unspecified: Secondary | ICD-10-CM

## 2017-04-29 ENCOUNTER — Ambulatory Visit
Admission: RE | Admit: 2017-04-29 | Discharge: 2017-04-29 | Disposition: A | Discharge status: Home / Self Care | Payer: Medicare Other | Source: Ambulatory Visit | Attending: Family Medicine | Admitting: Family Medicine

## 2017-04-29 DIAGNOSIS — I10 Essential (primary) hypertension: Secondary | ICD-10-CM | POA: Insufficient documentation

## 2017-04-29 DIAGNOSIS — N281 Cyst of kidney, acquired: Secondary | ICD-10-CM | POA: Insufficient documentation

## 2017-04-29 DIAGNOSIS — R1010 Upper abdominal pain, unspecified: Secondary | ICD-10-CM | POA: Diagnosis present

## 2017-04-29 DIAGNOSIS — Z9049 Acquired absence of other specified parts of digestive tract: Secondary | ICD-10-CM | POA: Diagnosis not present

## 2017-07-27 ENCOUNTER — Ambulatory Visit: Admit: 2017-07-27 | Payer: Medicare Other | Admitting: Internal Medicine

## 2017-07-27 SURGERY — ESOPHAGOGASTRODUODENOSCOPY (EGD) WITH PROPOFOL
Anesthesia: General

## 2017-11-28 ENCOUNTER — Emergency Department: Payer: Medicare Other

## 2017-11-28 ENCOUNTER — Encounter: Payer: Self-pay | Admitting: Emergency Medicine

## 2017-11-28 ENCOUNTER — Emergency Department
Admission: EM | Admit: 2017-11-28 | Discharge: 2017-11-28 | Disposition: A | Discharge status: Home / Self Care | Payer: Medicare Other | Attending: Emergency Medicine | Admitting: Emergency Medicine

## 2017-11-28 DIAGNOSIS — Z7901 Long term (current) use of anticoagulants: Secondary | ICD-10-CM | POA: Diagnosis not present

## 2017-11-28 DIAGNOSIS — Z9011 Acquired absence of right breast and nipple: Secondary | ICD-10-CM | POA: Diagnosis not present

## 2017-11-28 DIAGNOSIS — Z8551 Personal history of malignant neoplasm of bladder: Secondary | ICD-10-CM | POA: Diagnosis not present

## 2017-11-28 DIAGNOSIS — R42 Dizziness and giddiness: Secondary | ICD-10-CM | POA: Diagnosis present

## 2017-11-28 DIAGNOSIS — I10 Essential (primary) hypertension: Secondary | ICD-10-CM | POA: Insufficient documentation

## 2017-11-28 DIAGNOSIS — E039 Hypothyroidism, unspecified: Secondary | ICD-10-CM | POA: Diagnosis not present

## 2017-11-28 DIAGNOSIS — Z79899 Other long term (current) drug therapy: Secondary | ICD-10-CM | POA: Diagnosis not present

## 2017-11-28 DIAGNOSIS — R112 Nausea with vomiting, unspecified: Secondary | ICD-10-CM | POA: Insufficient documentation

## 2017-11-28 DIAGNOSIS — Z8673 Personal history of transient ischemic attack (TIA), and cerebral infarction without residual deficits: Secondary | ICD-10-CM | POA: Insufficient documentation

## 2017-11-28 DIAGNOSIS — Z853 Personal history of malignant neoplasm of breast: Secondary | ICD-10-CM | POA: Diagnosis not present

## 2017-11-28 LAB — COMPREHENSIVE METABOLIC PANEL
ALK PHOS: 62 U/L (ref 38–126)
ALT: 10 U/L — AB (ref 14–54)
AST: 21 U/L (ref 15–41)
Albumin: 3.8 g/dL (ref 3.5–5.0)
Anion gap: 8 (ref 5–15)
BUN: 19 mg/dL (ref 6–20)
CALCIUM: 9 mg/dL (ref 8.9–10.3)
CO2: 25 mmol/L (ref 22–32)
CREATININE: 0.61 mg/dL (ref 0.44–1.00)
Chloride: 105 mmol/L (ref 101–111)
Glucose, Bld: 127 mg/dL — ABNORMAL HIGH (ref 65–99)
Potassium: 3.6 mmol/L (ref 3.5–5.1)
Sodium: 138 mmol/L (ref 135–145)
Total Bilirubin: 0.3 mg/dL (ref 0.3–1.2)
Total Protein: 7.4 g/dL (ref 6.5–8.1)

## 2017-11-28 LAB — URINALYSIS, COMPLETE (UACMP) WITH MICROSCOPIC
BILIRUBIN URINE: NEGATIVE
Glucose, UA: NEGATIVE mg/dL
KETONES UR: NEGATIVE mg/dL
Nitrite: NEGATIVE
PROTEIN: NEGATIVE mg/dL
Specific Gravity, Urine: 1.011 (ref 1.005–1.030)
pH: 7 (ref 5.0–8.0)

## 2017-11-28 LAB — CBC
HEMATOCRIT: 30.2 % — AB (ref 35.0–47.0)
Hemoglobin: 9.7 g/dL — ABNORMAL LOW (ref 12.0–16.0)
MCH: 25.9 pg — AB (ref 26.0–34.0)
MCHC: 32.1 g/dL (ref 32.0–36.0)
MCV: 80.7 fL (ref 80.0–100.0)
PLATELETS: 359 10*3/uL (ref 150–440)
RBC: 3.74 MIL/uL — AB (ref 3.80–5.20)
RDW: 16.7 % — ABNORMAL HIGH (ref 11.5–14.5)
WBC: 7.2 10*3/uL (ref 3.6–11.0)

## 2017-11-28 LAB — PROTIME-INR
INR: 2.44
PROTHROMBIN TIME: 26.3 s — AB (ref 11.4–15.2)

## 2017-11-28 LAB — TROPONIN I

## 2017-11-28 LAB — LIPASE, BLOOD: Lipase: 26 U/L (ref 11–51)

## 2017-11-28 MED ORDER — ONDANSETRON HCL 4 MG/2ML IJ SOLN: 4.0000 mg | Freq: Once | INTRAMUSCULAR | Status: DC | PRN

## 2017-11-28 MED ORDER — ONDANSETRON 4 MG PO TBDP
4.0000 mg | ORAL_TABLET | Freq: Once | ORAL | Status: AC
  Administered 2017-11-28: 4 mg via ORAL
  Filled 2017-11-28: qty 1

## 2017-11-28 MED ORDER — SODIUM CHLORIDE 0.9 % IV BOLUS
1000.0000 mL | Freq: Once | INTRAVENOUS | Status: AC
  Administered 2017-11-28: 1000 mL via INTRAVENOUS

## 2017-11-28 MED ORDER — MECLIZINE HCL 25 MG PO TABS: 25.0000 mg | ORAL_TABLET | Freq: Three times a day (TID) | ORAL | 0 refills | Status: DC | PRN

## 2017-11-28 MED ORDER — MECLIZINE HCL 25 MG PO TABS
25.0000 mg | ORAL_TABLET | Freq: Once | ORAL | Status: AC
  Administered 2017-11-28: 25 mg via ORAL
  Filled 2017-11-28: qty 1

## 2017-11-28 NOTE — ED Provider Notes (Signed)
[facility] Emergency Department Provider Note  ___________________________________________   First MD Initiated Contact with Patient 11/28/17 1845     (approximate)  I have reviewed the triage vital signs and the nursing notes.   HISTORY  Chief Complaint Dizziness and Emesis   HPI Gabriela [name] is a 84 y.o. female with a history of GERD, stroke as well as vertigo who is presenting with vertigo.  Patient says that this episode started today but she has had 2 other episodes in the past attributed to dehydration.  She said that she was having a spinning sensation in late April and was seen by her cardiologist told her to drink plenty of fluids.  However, she says that she has been drinking plenty of fluids.  Denies any recent upper respiratory symptoms such as runny nose or ear pressure.  Denies any weakness or numbness.  Denies any burning with urination.  Says that when she is sitting still that she does not have the spinning sensation but that when she moves her head or gets up to walk the symptoms worsen.  She says that the spinning sensation also makes her nauseous.  Past Medical History:  Diagnosis Date  . Anxiety   . Cancer ([place])    breast,bladder  . Depression   . Dysrhythmia    afib  . GERD (gastroesophageal reflux disease)   . History of hiatal hernia   . HOH (hard of hearing)    aids  . Hypertension   . Hypothyroidism   . Stroke [facility])    2010    Patient Active Problem List   Diagnosis Date Noted  . Sacroiliitis ([place]) 07/12/2016  . Pain in right hip 07/12/2016    Past Surgical History:  Procedure Laterality Date  . ABDOMINAL HYSTERECTOMY    . ANKLE ARTHODESIS W/ ARTHROSCOPY    . BLADDER SURGERY    . CATARACT EXTRACTION W/PHACO Left 12/02/2014   Procedure: CATARACT EXTRACTION PHACO AND INTRAOCULAR LENS PLACEMENT (IOC);  Surgeon: Estill Cotta, MD;  Location: [facility] ORS;  Service: Ophthalmology;  Laterality: Left;   US01:45 AP%25.7 CDE42.99  . CHOLECYSTECTOMY    . EYE SURGERY     cataract  . JOINT REPLACEMENT     bil tkr  . MASTECTOMY     partial right    Prior to Admission medications   Medication Sig Start Date End Date Taking? Authorizing Provider  ALPRAZolam (XANAX) 0.25 MG tablet Take 0.125 mg by mouth at bedtime as needed for anxiety.    [provider]  cephALEXin (KEFLEX) 500 MG capsule Take 1 capsule (500 mg total) by mouth 2 (two) times daily. Patient not taking: Reported on 07/12/2016 11/28/15   Orbie Pyo, MD  diltiazem [facility]) 240 MG 24 hr capsule Take 240 mg by mouth daily. am    [provider]  levothyroxine (SYNTHROID, LEVOTHROID) 112 MCG tablet Take 112 mcg by mouth daily before breakfast.    [provider]  meclizine (ANTIVERT) 25 MG tablet Take 1 tablet (25 mg total) by mouth 3 (three) times daily as needed for dizziness or nausea. 04/07/17   Paulette Blanch, MD  omeprazole (PRILOSEC) 20 MG capsule Take 20 mg by mouth daily.    [provider]  ondansetron (ZOFRAN ODT) 4 MG disintegrating tablet Take 1 tablet (4 mg total) by mouth every 8 (eight) hours as needed for nausea or vomiting. 04/07/17   Paulette Blanch, MD  sertraline (ZOLOFT) 50 MG tablet Take 50 mg  by mouth daily. hs    [provider]  simvastatin (ZOCOR) 10 MG tablet Take 10 mg by mouth daily. hs    [provider]  warfarin (COUMADIN) 3 MG tablet Take 3 mg by mouth daily. Hs 5 days a week    [provider]  warfarin (COUMADIN) 4 MG tablet Take 4 mg by mouth daily. Hs 2 days a week    [provider]    Allergies Celebrex [celecoxib]; Morphine and related; and Sulfa antibiotics  No family history on file.  Social History Social History   Tobacco Use  . Smoking status: Never Smoker  . Smokeless tobacco: Never Used  Substance Use Topics  . Alcohol use: No  . Drug use: Not on file    Review of Systems  Constitutional: No  fever/chills Eyes: No visual changes. ENT: No sore throat. Cardiovascular: Denies chest pain. Respiratory: Denies shortness of breath. Gastrointestinal: No abdominal pain.  no vomiting.  No diarrhea.  No constipation. Genitourinary: Negative for dysuria. Musculoskeletal: Negative for back pain. Skin: Negative for rash. Neurological: Negative for headaches, focal weakness or numbness.   ____________________________________________   PHYSICAL EXAM:  VITAL SIGNS: ED Triage Vitals  Enc Vitals Group     BP 11/28/17 1739 (!) 148/83     Pulse Rate 11/28/17 1739 60     Resp 11/28/17 1739 18     Temp 11/28/17 1739 97.6 F (36.4 C)     Temp Source 11/28/17 1739 Oral     SpO2 11/28/17 1739 99 %     Weight 11/28/17 1751 175 lb (79.4 kg)     Height 11/28/17 1751 5\' 3"  (1.6 m)     Head Circumference --      Peak Flow --      Pain Score 11/28/17 1751 0     Pain Loc --      Pain Edu? --      Excl. in [facility]? --     Constitutional: Alert and oriented. Well appearing and in no acute distress. Eyes: Conjunctivae are normal.  Head: Atraumatic.  Normal TMs bilaterally. Nose: No congestion/rhinnorhea. Mouth/Throat: Mucous membranes are moist.  Neck: No stridor.   Cardiovascular: Normal rate, regular rhythm. Grossly normal heart sounds.   Respiratory: Normal respiratory effort.  No retractions. Lungs CTAB. Gastrointestinal: Soft and nontender. No distention.  Musculoskeletal: No lower extremity tenderness nor edema.  No joint effusions. Neurologic:  Normal speech and language. No gross focal neurologic deficits are appreciated.  No ataxia on heel-to-shin testing.  No ataxia on finger-to-nose testing.  No nystagmus. Skin:  Skin is warm, dry and intact. No rash noted. Psychiatric: Mood and affect are normal. Speech and behavior are normal.  ____________________________________________   LABS (all labs ordered are listed, but only abnormal results are displayed)  Labs Reviewed   COMPREHENSIVE METABOLIC PANEL - Abnormal; Notable for the following components:      Result Value   Glucose, Bld 127 (*)    ALT 10 (*)    All other components within normal limits  CBC - Abnormal; Notable for the following components:   RBC 3.74 (*)    Hemoglobin 9.7 (*)    HCT 30.2 (*)    MCH 25.9 (*)    RDW 16.7 (*)    All other components within normal limits  PROTIME-INR - Abnormal; Notable for the following components:   Prothrombin Time 26.3 (*)    All other components within normal limits  LIPASE, BLOOD  TROPONIN I  URINALYSIS, COMPLETE (UACMP) WITH MICROSCOPIC   ____________________________________________  EKG  ED ECG REPORT I, Doran Stabler, the attending physician, personally viewed and interpreted this ECG.   Date: 11/28/2017  EKG Time: 1744  Rate: 58  Rhythm: atrial fibrillation, rate 58  Axis: Normal  Intervals:none  ST&T Change: No ST segment elevation or depression.  No abnormal T wave inversion.  ____________________________________________  RADIOLOGY  Head CT with remote stroke as well as likely hemangioma.  No acute finding. ____________________________________________   PROCEDURES  Procedure(s) performed:   Procedures  Critical Care performed:   ____________________________________________   INITIAL IMPRESSION / ASSESSMENT AND PLAN / ED COURSE  Pertinent labs & imaging results that were available during my care of the patient were reviewed by me and considered in my medical decision making (see chart for details).  DDX: Peripheral vertigo, labyrinthitis, central vertigo, CVA, intracranial mass, intrarenal hemorrhage, less short abnormality, UTI, dehydration As part of my medical decision making, I reviewed the following data within the [place] cardiology visit from April 24.  ----------------------------------------- 8:54 PM on 11/28/2017 -----------------------------------------  About 30 minutes  after receiving meclizine the patient says that she is feeling improved.  She has had about 300 cc of fluid at this point as well.  Still pending urinalysis.  Signed out to Dr. Archie Balboa for reassessment.  My impression at this is likely peripheral vertigo.  Reassuring CAT scan of the brain.  Reassuring lab work.  Vertigo is positional.  Seems to be already relieved with meclizine. ____________________________________________   FINAL CLINICAL IMPRESSION(S) / ED DIAGNOSES  Vertigo.    NEW MEDICATIONS STARTED DURING THIS VISIT:  New Prescriptions   No medications on file     Note:  This document was prepared using Dragon voice recognition software and may include unintentional dictation errors.     Orbie Pyo, MD 11/28/17 2055

## 2017-11-28 NOTE — ED Notes (Signed)
Patient transported to CT 

## 2017-11-28 NOTE — Discharge Instructions (Addendum)
Please seek medical attention for any high fevers, chest pain, shortness of breath, change in behavior, persistent vomiting, bloody stool or any other new or concerning symptoms.  

## 2017-11-28 NOTE — ED Triage Notes (Signed)
Pt arrived with family with complaints of dizziness and nausea. Pt has a seen here for a similar complaints but was attributed to dehydration. Patient report's today's episode "is worse." Pt feels nauseated but denies any emesis. Pt denies shortness of breath or chest pain. Pt is alert and oriented x 4.

## 2017-11-30 LAB — URINE CULTURE

## 2018-06-19 ENCOUNTER — Ambulatory Visit: Payer: Self-pay | Admitting: Urology

## 2018-06-19 ENCOUNTER — Ambulatory Visit: Payer: Medicare Other | Admitting: Urology

## 2018-06-19 ENCOUNTER — Encounter: Payer: Self-pay | Admitting: Urology

## 2018-06-19 VITALS — BP 158/76 | HR 84 | Ht 63.0 in | Wt 169.2 lb

## 2018-06-19 DIAGNOSIS — N390 Urinary tract infection, site not specified: Secondary | ICD-10-CM

## 2018-06-19 DIAGNOSIS — Z8551 Personal history of malignant neoplasm of bladder: Secondary | ICD-10-CM

## 2018-06-19 LAB — MICROSCOPIC EXAMINATION

## 2018-06-19 LAB — URINALYSIS, COMPLETE
BILIRUBIN UA: NEGATIVE
Glucose, UA: NEGATIVE
KETONES UA: NEGATIVE
Nitrite, UA: POSITIVE — AB
PH UA: 6 (ref 5.0–7.5)
SPEC GRAV UA: 1.025 (ref 1.005–1.030)
Urobilinogen, Ur: 0.2 mg/dL (ref 0.2–1.0)

## 2018-06-19 NOTE — Patient Instructions (Signed)
1. Consider daily probiotic  2. Consider cranberry tablets twice daily coordinating with coumadin clinic- start 1 week before next INR check  3. Available for same day nurse visits here as needed for UTI

## 2018-06-19 NOTE — Progress Notes (Signed)
06/19/2018 3:49 PM   Gabriela [name] 21-Jan-1932 [id number]  Referring provider: [name], MD [address] [place], [place] [address]  Chief Complaint  Patient presents with  . Establish Care  . Bladder Cancer    HPI: 84 yo F with a personal history of bladder cancer and recurrent urinary tract infections who presents to the office today to establish care.  She was previously followed by Dr. Jacqlyn Larsen at [facility] urology.  She has a personal history of LgTa  TCC in 2011.   She believes that she was initially diagnosed with this and underwent a procedure by different surgeon and had a almost immediate recurrence and underwent TURBT with Dr. Jacqlyn Larsen.  I do have surgical pathology dating to 2011 indicating low-grade noninvasive disease.  She believes that she was subsequently treated with an induction course of BCG.  She is had no recurrence since.  Her last cystoscopy was 12/23/2016 without evidence of diease.  No gross hematuria.  She does have a personal history of recurrent urinary tract infections.  Over the past year, she has 3 documented infections.  She believes that over the past few years, these have been increasing in frequency.  She does have a personal history of breast cancer.  She also has frequent loose bowel movements and fecal incontinence.  She is not on cranberry tablets, probiotic, or any other UTI prevention medications.    Her symptoms are fatigue and malaise.  No dysuria or gross hematuria.    She is asymptomatic today and feels well.  She has no voiding symptoms.  No gross hematuria or dysuria.  No urgency or frequency.  No fevers or chills.  PMH: Past Medical History:  Diagnosis Date  . Acid reflux   . Anxiety   . Arrhythmia   . Bladder cancer ([place])   . Breast cancer ([place])   . Cancer ([place])    breast,bladder  . Depression   . Dysrhythmia    afib  . GERD (gastroesophageal reflux disease)   . History of hiatal hernia   . HOH (hard of hearing)    aids  .  Hypertension   . Hyperthyroidism   . Hypothyroidism   . Stroke ([place])    2010  . TIA (transient ischemic attack)     Surgical History: Past Surgical History:  Procedure Laterality Date  . ABDOMINAL HYSTERECTOMY    . ANKLE ARTHODESIS W/ ARTHROSCOPY    . BLADDER SURGERY    . CATARACT EXTRACTION W/PHACO Left 12/02/2014   Procedure: CATARACT EXTRACTION PHACO AND INTRAOCULAR LENS PLACEMENT (IOC);  Surgeon: Estill Cotta, MD;  Location: [facility] ORS;  Service: Ophthalmology;  Laterality: Left;  US01:45 AP%25.7 CDE42.99  . CHOLECYSTECTOMY    . EYE SURGERY     cataract  . JOINT REPLACEMENT     bil tkr  . MASTECTOMY     partial right  . REPLACEMENT TOTAL KNEE      Home Medications:  Allergies as of 06/19/2018      Reactions   Celebrex [celecoxib] Anaphylaxis   Morphine And Related Swelling   Lips and mouth   Povidone-iodine Other (See Comments)   Other reaction(s): ITCHING Other reaction(s): ITCHING   Sulfa Antibiotics       Medication List        Accurate as of 06/19/18  3:49 PM. Always use your most recent med list.          ALPRAZolam 0.25 MG tablet Commonly known as:  XANAX Take 0.125  mg by mouth at bedtime as needed for anxiety.   diltiazem 240 MG 24 hr capsule Commonly known as:  TIAZAC Take 240 mg by mouth daily. am   levothyroxine 112 MCG tablet Commonly known as:  SYNTHROID, LEVOTHROID Take 112 mcg by mouth daily before breakfast.   meclizine 25 MG tablet Commonly known as:  ANTIVERT Take 1 tablet (25 mg total) by mouth 3 (three) times daily as needed for dizziness or nausea.   omeprazole 20 MG capsule Commonly known as:  PRILOSEC Take 20 mg by mouth daily.   ondansetron 4 MG disintegrating tablet Commonly known as:  ZOFRAN-ODT Take 1 tablet (4 mg total) by mouth every 8 (eight) hours as needed for nausea or vomiting.   pantoprazole 40 MG tablet Commonly known as:  PROTONIX 2 (two) times daily.   sertraline 50 MG tablet Commonly known as:   ZOLOFT Take 50 mg by mouth daily. hs   simvastatin 10 MG tablet Commonly known as:  ZOCOR Take 10 mg by mouth daily. hs   warfarin 3 MG tablet Commonly known as:  COUMADIN Take 3 mg by mouth daily. Hs 5 days a week   warfarin 4 MG tablet Commonly known as:  COUMADIN Take 4 mg by mouth daily. Hs 2 days a week       Allergies:  Allergies  Allergen Reactions  . Celebrex [Celecoxib] Anaphylaxis  . Morphine And Related Swelling    Lips and mouth  . Povidone-Iodine Other (See Comments)    Other reaction(s): ITCHING Other reaction(s): ITCHING   . Sulfa Antibiotics     Family History: No family history on file.  Social History:  reports that she has never smoked. She has never used smokeless tobacco. She reports that she does not drink alcohol. Her drug history is not on file.  ROS: UROLOGY Frequent Urination?: No Hard to postpone urination?: Yes Burning/pain with urination?: No Get up at night to urinate?: No Leakage of urine?: Yes Urine stream starts and stops?: No Trouble starting stream?: No Do you have to strain to urinate?: No Blood in urine?: No Urinary tract infection?: Yes Sexually transmitted disease?: No Injury to kidneys or bladder?: No Painful intercourse?: No Weak stream?: No Currently pregnant?: No Vaginal bleeding?: No Last menstrual period?: n  Gastrointestinal Nausea?: No Vomiting?: No Indigestion/heartburn?: Yes Diarrhea?: Yes Constipation?: No  Constitutional Fever: No Night sweats?: No Weight loss?: No Fatigue?: No  Skin Skin rash/lesions?: No Itching?: No  Eyes Blurred vision?: No Double vision?: No  Ears/Nose/Throat Sore throat?: No Sinus problems?: No  Hematologic/Lymphatic Swollen glands?: No Easy bruising?: No  Cardiovascular Leg swelling?: No Chest pain?: No  Respiratory Cough?: No Shortness of breath?: Yes  Endocrine Excessive thirst?: No  Musculoskeletal Back pain?: No Joint pain?:  No  Neurological Headaches?: No Dizziness?: No  Psychologic Depression?: No Anxiety?: No  Physical Exam: BP (!) 158/76 (BP Location: Left Arm, Patient Position: Sitting)   Pulse 84   Ht 5\' 3"  (1.6 m)   Wt 169 lb 3.2 oz (76.7 kg)   LMP  (LMP Unknown)   BMI 29.97 kg/m   Constitutional:  Alert and oriented, No acute distress.  Petite by daughter today. HEENT: [place] AT, moist mucus membranes.  Trachea midline, no masses. Cardiovascular: No clubbing, cyanosis, or edema. Respiratory: Normal respiratory effort, no increased work of breathing. GI: Abdomen is soft, nontender, nondistended, no abdominal masses Skin: No rashes, bruises or suspicious lesions. Neurologic: Grossly intact, no focal deficits, moving all 4 extremities. Psychiatric: Normal  mood and affect.  Laboratory Data: Lab Results  Component Value Date   WBC 7.2 11/28/2017   HGB 9.7 (L) 11/28/2017   HCT 30.2 (L) 11/28/2017   MCV 80.7 11/28/2017   PLT 359 11/28/2017    Lab Results  Component Value Date   CREATININE 0.61 11/28/2017    Urinalysis Results for orders placed or performed in visit on 06/19/18  Microscopic Examination  Result Value Ref Range   WBC, UA 11-30 (A) 0 - 5 /hpf   RBC, UA 3-10 (A) 0 - 2 /hpf   Epithelial Cells (non renal) 0-10 0 - 10 /hpf   Mucus, UA Present (A) Not Estab.   Bacteria, UA Many (A) None seen/Few  Urinalysis, Complete  Result Value Ref Range   Specific Gravity, UA 1.025 1.005 - 1.030   pH, UA 6.0 5.0 - 7.5   Color, UA Yellow Yellow   Appearance Ur Cloudy (A) Clear   Leukocytes, UA 1+ (A) Negative   Protein, UA 1+ (A) Negative/Trace   Glucose, UA Negative Negative   Ketones, UA Negative Negative   RBC, UA 2+ (A) Negative   Bilirubin, UA Negative Negative   Urobilinogen, Ur 0.2 0.2 - 1.0 mg/dL   Nitrite, UA Positive (A) Negative   Microscopic Examination See below:      Assessment & Plan:    1. Recurrent UTI We discussed prevention strategies for current urinary  tract infections today at length Given her personal history of breast cancer, hesitant to start her on topical estrogen cream We did discuss the addition of probiotic which may help with her loose stool as well She will also consider cranberry tablets but elected to do this under the guidance of her primary care physician/Coumadin clinic as this can have effects on her INR He was educated on the difference between asymptomatic bacteriuria and a true urinary tract infection Based on her UA today, I suspect that she is chronically colonized and should only be treated if she is symptomatic which may indeed include fatigue malaise if no other source of infection is identified She was offered same-day nurse visits for possible UTI treatments in our office If she continues to have frequent infections, will consider additional interventions - Urinalysis, Complete  2. History of bladder cancer Personal history of low-grade TA TCC dating back to 2011 without recurrence since that time Reviewed the guidelines for surveillance for 5 years and then shared decision making on whether or not to continue surveillance beyond that point It seems reasonable to stretch her next cystoscopy out to the 2-year mark, due 11/2017 then consider d/c    Return in about 6 months (around 12/18/2018) for cystoscopy.  Hollice Espy, MD  [facility] [address] [place], [place] [address] [phone]

## 2018-06-21 ENCOUNTER — Ambulatory Visit: Payer: Self-pay | Admitting: Urology

## 2018-12-19 ENCOUNTER — Other Ambulatory Visit: Payer: Medicare Other | Admitting: Urology

## 2018-12-20 ENCOUNTER — Encounter: Payer: Self-pay | Admitting: Urology

## 2018-12-20 ENCOUNTER — Other Ambulatory Visit: Payer: Self-pay

## 2018-12-20 ENCOUNTER — Ambulatory Visit (INDEPENDENT_AMBULATORY_CARE_PROVIDER_SITE_OTHER): Payer: Medicare Other | Admitting: Urology

## 2018-12-20 VITALS — BP 159/70 | HR 81 | Ht 63.0 in | Wt 167.0 lb

## 2018-12-20 DIAGNOSIS — Z8551 Personal history of malignant neoplasm of bladder: Secondary | ICD-10-CM | POA: Diagnosis not present

## 2018-12-20 DIAGNOSIS — N390 Urinary tract infection, site not specified: Secondary | ICD-10-CM | POA: Diagnosis not present

## 2018-12-20 LAB — URINALYSIS, COMPLETE
Bilirubin, UA: NEGATIVE
Glucose, UA: NEGATIVE
Ketones, UA: NEGATIVE
Nitrite, UA: POSITIVE — AB
Protein,UA: NEGATIVE
Specific Gravity, UA: 1.02 (ref 1.005–1.030)
Urobilinogen, Ur: 0.2 mg/dL (ref 0.2–1.0)
pH, UA: 7 (ref 5.0–7.5)

## 2018-12-20 LAB — MICROSCOPIC EXAMINATION

## 2018-12-20 MED ORDER — NITROFURANTOIN MONOHYD MACRO 100 MG PO CAPS: 100.0000 mg | ORAL_CAPSULE | Freq: Two times a day (BID) | ORAL | 0 refills | Status: DC

## 2018-12-20 NOTE — Progress Notes (Signed)
12/20/2018 11:12 AM   Gabriela [name] 23-Jan-1932 [id number]  Referring provider: [name], MD [place], [place] [id number]  No chief complaint on file.   HPI: 85 year old female with a remote history of bladder cancer to we will schedule for cystoscopy today.  Her urine today appears somewhat suspicious for infection, presence of nitrites, leukocytes 11-30 as well as many bacteria appreciated.  Notably, the patient appears to be chronically colonized with bacteria.  She does report that she is somewhat fatigued and under the weather but without specific urinary tract symptoms including no dysuria, gross hematuria, fevers or chills.   PMH: Past Medical History:  Diagnosis Date  . Acid reflux   . Anxiety   . Arrhythmia   . Bladder cancer ([place])   . Breast cancer ([place])   . Cancer ([place])    breast,bladder  . Depression   . Dysrhythmia    afib  . GERD (gastroesophageal reflux disease)   . History of hiatal hernia   . HOH (hard of hearing)    aids  . Hypertension   . Hyperthyroidism   . Hypothyroidism   . Stroke ([place])    2010  . TIA (transient ischemic attack)     Surgical History: Past Surgical History:  Procedure Laterality Date  . ABDOMINAL HYSTERECTOMY    . ANKLE ARTHODESIS W/ ARTHROSCOPY    . BLADDER SURGERY    . CATARACT EXTRACTION W/PHACO Left 12/02/2014   Procedure: CATARACT EXTRACTION PHACO AND INTRAOCULAR LENS PLACEMENT (IOC);  Surgeon: Estill Cotta, MD;  Location: [facility] ORS;  Service: Ophthalmology;  Laterality: Left;  US01:45 AP%25.7 CDE42.99  . CHOLECYSTECTOMY    . EYE SURGERY     cataract  . JOINT REPLACEMENT     bil tkr  . MASTECTOMY     partial right  . REPLACEMENT TOTAL KNEE      Home Medications:  Allergies as of 12/20/2018      Reactions   Celebrex [celecoxib] Anaphylaxis   Morphine And Related Swelling   Lips and mouth   Povidone-iodine Other (See Comments)   Other reaction(s): ITCHING Other reaction(s):  ITCHING   Sulfa Antibiotics       Medication List       Accurate as of Dec 20, 2018 11:12 AM. If you have any questions, ask your nurse or doctor.        STOP taking these medications   omeprazole 20 MG capsule Commonly known as:  PRILOSEC Stopped by:  Hollice Espy, MD   pantoprazole 40 MG tablet Commonly known as:  PROTONIX Stopped by:  Hollice Espy, MD   simvastatin 10 MG tablet Commonly known as:  ZOCOR Stopped by:  Hollice Espy, MD     TAKE these medications   ALPRAZolam 0.25 MG tablet Commonly known as:  XANAX Take 0.125 mg by mouth at bedtime as needed for anxiety.   diltiazem 240 MG 24 hr capsule Commonly known as:  TIAZAC Take 240 mg by mouth daily. am   levothyroxine 112 MCG tablet Commonly known as:  SYNTHROID Take 112 mcg by mouth daily before breakfast.   meclizine 25 MG tablet Commonly known as:  ANTIVERT Take 1 tablet (25 mg total) by mouth 3 (three) times daily as needed for dizziness or nausea.   ondansetron 4 MG disintegrating tablet Commonly known as:  Zofran ODT Take 1 tablet (4 mg total) by mouth every 8 (eight) hours as needed for nausea or vomiting.   sertraline 50 MG tablet  Commonly known as:  ZOLOFT Take 50 mg by mouth daily. hs   warfarin 3 MG tablet Commonly known as:  COUMADIN Take 3 mg by mouth daily. Hs 5 days a week   warfarin 4 MG tablet Commonly known as:  COUMADIN Take 4 mg by mouth daily. Hs 2 days a week       Allergies:  Allergies  Allergen Reactions  . Celebrex [Celecoxib] Anaphylaxis  . Morphine And Related Swelling    Lips and mouth  . Povidone-Iodine Other (See Comments)    Other reaction(s): ITCHING Other reaction(s): ITCHING   . Sulfa Antibiotics     Family History: No family history on file.  Social History:  reports that she has never smoked. She has never used smokeless tobacco. She reports that she does not drink alcohol. No history on file for drug.  ROS: UROLOGY Frequent Urination?:  No Hard to postpone urination?: No Burning/pain with urination?: No Get up at night to urinate?: No Leakage of urine?: No Urine stream starts and stops?: No Trouble starting stream?: No Do you have to strain to urinate?: No Blood in urine?: No Urinary tract infection?: No Sexually transmitted disease?: No Injury to kidneys or bladder?: No Painful intercourse?: No Weak stream?: No Currently pregnant?: No Vaginal bleeding?: No Last menstrual period?: no  Gastrointestinal Nausea?: No Vomiting?: No Indigestion/heartburn?: No Diarrhea?: No Constipation?: No  Constitutional Fever: No Night sweats?: No Weight loss?: No Fatigue?: No  Skin Skin rash/lesions?: No Itching?: No  Eyes Blurred vision?: No Double vision?: No  Ears/Nose/Throat Sore throat?: No Sinus problems?: No  Hematologic/Lymphatic Swollen glands?: No Easy bruising?: No  Cardiovascular Leg swelling?: No Chest pain?: No  Respiratory Cough?: No Shortness of breath?: No  Endocrine Excessive thirst?: No  Musculoskeletal Back pain?: No Joint pain?: No  Neurological Headaches?: No Dizziness?: No  Psychologic Depression?: No Anxiety?: No  Physical Exam: BP (!) 159/70   Pulse 81   Ht 5\' 3"  (1.6 m)   Wt 167 lb (75.8 kg)   LMP  (LMP Unknown)   BMI 29.58 kg/m   Constitutional:  Alert and oriented, No acute distress.  Accompanied by daughter today. HEENT: [place] AT, moist mucus membranes.  Trachea midline, no masses. Cardiovascular: No clubbing, cyanosis, or edema. Respiratory: Normal respiratory effort, no increased work of breathing. Skin: No rashes, bruises or suspicious lesions. Neurologic: Grossly intact, no focal deficits, moving all 4 extremities. Psychiatric: Normal mood and affect.   Urinalysis Urine with 11-30 white blood cells, no blood, many bacteria, nitrate positive.  See epic for details.   Assessment & Plan:    1. History of bladder cancer Very remote history of  bladder cancer Based on our previous conversation, through shared decision making we had agreed to proceed with this cystoscopy today at the two-year mark, may abstain from further surveillance if no recurrence In the setting of colonization, will defer cystoscopy today rescheduled for in 2 weeks - Urinalysis, Complete - Urine culture  2. Recurrent UTI Patient may have chronic bacterial colonization, however given her age and frailty, I am concerned that performing cystoscopy and otherwise infected bladder may increase infectious complications or interfere with visualization We have agreed to treat her urine today and have her return in 2 weeks after completing course of antibiotics She is agreeable this plan   Return in about 2 weeks (around 01/03/2019) for cystoscopy.  Hollice Espy, MD  [facility] [address], [place], [place] [address] [phone]

## 2018-12-22 LAB — URINE CULTURE

## 2019-01-02 ENCOUNTER — Other Ambulatory Visit: Payer: Self-pay

## 2019-01-02 ENCOUNTER — Ambulatory Visit: Payer: Medicare Other | Admitting: Urology

## 2019-01-02 ENCOUNTER — Encounter: Payer: Self-pay | Admitting: Urology

## 2019-01-02 VITALS — BP 136/70 | HR 61 | Ht 63.0 in | Wt 157.0 lb

## 2019-01-02 DIAGNOSIS — R8271 Bacteriuria: Secondary | ICD-10-CM

## 2019-01-02 DIAGNOSIS — Z8551 Personal history of malignant neoplasm of bladder: Secondary | ICD-10-CM

## 2019-01-02 LAB — URINALYSIS, COMPLETE
Bilirubin, UA: NEGATIVE
Glucose, UA: NEGATIVE
Nitrite, UA: NEGATIVE
Specific Gravity, UA: 1.025 (ref 1.005–1.030)
Urobilinogen, Ur: 0.2 mg/dL (ref 0.2–1.0)
pH, UA: 5.5 (ref 5.0–7.5)

## 2019-01-02 LAB — MICROSCOPIC EXAMINATION: Epithelial Cells (non renal): >10 /hpf — AB (ref 0–10)

## 2019-01-02 NOTE — Progress Notes (Signed)
   01/02/19  CC:  Chief Complaint  Patient presents with  . Cysto    HPI: 85 yo the remote history of bladder cancer who returns today for cystoscopy.  Please see previous notes for details.  She was treated for presumed UTI prior to today's cystoscopy in light of recent UA/urine culture.  Notably the Patient is chronically colonized.  Blood pressure 136/70, pulse 61, height 5\' 3"  (1.6 m), weight 157 lb (71.2 kg). NED. A&Ox3.   No respiratory distress   Abd soft, NT, ND Normal external genitalia with patent urethral meatus, vaginal stenosis and atrophy appreciated  Cystoscopy Procedure Note  Patient identification was confirmed, informed consent was obtained, and patient was prepped using Betadine solution.  Lidocaine jelly was administered per urethral meatus.    Procedure: - Flexible cystoscope introduced, without any difficulty.   - Thorough search of the bladder revealed:    normal urethral meatus    normal urothelium    no stones    no ulcers     no tumors    no urethral polyps    Mild/ mod trabeculation   Mild debris  - Ureteral orifices were normal in position and appearance.  Post-Procedure: - Patient tolerated the procedure well  Assessment/ Plan:  1. History of bladder cancer Very remote history of bladder cancer without recurrence No evidence of disease today At this point in time, we did shared decision making about whether or not to proceed with continued surveillance, risk and benefits were discussed per NCCN/AUA/EUA guidelines She would like to abstain from further cystoscopies but is welcome to change her mind - Urinalysis, Complete  2. Chronic bacteriuria Chronically colonized, recommend treatment only if she becomes symptomatic or prior to procedures   F/u as needed  Hollice Espy, MD

## 2019-01-22 NOTE — Addendum Note (Signed)
Addended by: Hollice Espy on: 01/22/2019 04:04 PM   Modules accepted: Level of Service

## 2020-07-10 ENCOUNTER — Other Ambulatory Visit: Payer: Self-pay

## 2020-07-10 ENCOUNTER — Emergency Department
Admission: EM | Admit: 2020-07-10 | Discharge: 2020-07-10 | Disposition: A | Discharge status: Home / Self Care | Payer: Medicare PPO | Attending: Emergency Medicine | Admitting: Emergency Medicine

## 2020-07-10 ENCOUNTER — Emergency Department: Payer: Medicare PPO

## 2020-07-10 ENCOUNTER — Encounter: Payer: Self-pay | Admitting: *Deleted

## 2020-07-10 DIAGNOSIS — R519 Headache, unspecified: Secondary | ICD-10-CM | POA: Diagnosis present

## 2020-07-10 DIAGNOSIS — Z853 Personal history of malignant neoplasm of breast: Secondary | ICD-10-CM | POA: Insufficient documentation

## 2020-07-10 DIAGNOSIS — Z8673 Personal history of transient ischemic attack (TIA), and cerebral infarction without residual deficits: Secondary | ICD-10-CM | POA: Insufficient documentation

## 2020-07-10 DIAGNOSIS — W19XXXA Unspecified fall, initial encounter: Secondary | ICD-10-CM | POA: Insufficient documentation

## 2020-07-10 DIAGNOSIS — I10 Essential (primary) hypertension: Secondary | ICD-10-CM | POA: Insufficient documentation

## 2020-07-10 DIAGNOSIS — Z8651 Personal history of combat and operational stress reaction: Secondary | ICD-10-CM | POA: Insufficient documentation

## 2020-07-10 DIAGNOSIS — E039 Hypothyroidism, unspecified: Secondary | ICD-10-CM | POA: Diagnosis not present

## 2020-07-10 DIAGNOSIS — Y9301 Activity, walking, marching and hiking: Secondary | ICD-10-CM | POA: Diagnosis not present

## 2020-07-10 DIAGNOSIS — Z79899 Other long term (current) drug therapy: Secondary | ICD-10-CM | POA: Insufficient documentation

## 2020-07-10 LAB — CBC WITH DIFFERENTIAL/PLATELET
Abs Immature Granulocytes: 0.03 10*3/uL (ref 0.00–0.07)
Basophils Absolute: 0 10*3/uL (ref 0.0–0.1)
Basophils Relative: 1 %
Eosinophils Absolute: 0.1 10*3/uL (ref 0.0–0.5)
Eosinophils Relative: 2 %
HCT: 28.3 % — ABNORMAL LOW (ref 36.0–46.0)
Hemoglobin: 8 g/dL — ABNORMAL LOW (ref 12.0–15.0)
Immature Granulocytes: 1 %
Lymphocytes Relative: 21 %
Lymphs Abs: 1 10*3/uL (ref 0.7–4.0)
MCH: 20.8 pg — ABNORMAL LOW (ref 26.0–34.0)
MCHC: 28.3 g/dL — ABNORMAL LOW (ref 30.0–36.0)
MCV: 73.5 fL — ABNORMAL LOW (ref 80.0–100.0)
Monocytes Absolute: 0.5 10*3/uL (ref 0.1–1.0)
Monocytes Relative: 9 %
Neutro Abs: 3.2 10*3/uL (ref 1.7–7.7)
Neutrophils Relative %: 66 %
Platelets: 344 10*3/uL (ref 150–400)
RBC: 3.85 MIL/uL — ABNORMAL LOW (ref 3.87–5.11)
RDW: 17.6 % — ABNORMAL HIGH (ref 11.5–15.5)
WBC: 4.8 10*3/uL (ref 4.0–10.5)
nRBC: 0 % (ref 0.0–0.2)

## 2020-07-10 LAB — COMPREHENSIVE METABOLIC PANEL
ALT: 10 U/L (ref 0–44)
AST: 23 U/L (ref 15–41)
Albumin: 3.7 g/dL (ref 3.5–5.0)
Alkaline Phosphatase: 56 U/L (ref 38–126)
Anion gap: 10 (ref 5–15)
BUN: 14 mg/dL (ref 8–23)
CO2: 25 mmol/L (ref 22–32)
Calcium: 8.9 mg/dL (ref 8.9–10.3)
Chloride: 106 mmol/L (ref 98–111)
Creatinine, Ser: 0.68 mg/dL (ref 0.44–1.00)
GFR, Estimated: >60 mL/min (ref >60)
Glucose, Bld: 124 mg/dL — ABNORMAL HIGH (ref 70–99)
Potassium: 3.7 mmol/L (ref 3.5–5.1)
Sodium: 141 mmol/L (ref 135–145)
Total Bilirubin: 0.6 mg/dL (ref 0.3–1.2)
Total Protein: 7.5 g/dL (ref 6.5–8.1)

## 2020-07-10 NOTE — ED Triage Notes (Signed)
Pt fell last Saturday and struck her head.  No LOC.  Pt is alert and oriented without any neuro deficits.  Pt is here as she states that she has "not felt quite right" since the fall.

## 2020-07-10 NOTE — Discharge Instructions (Signed)
Keep follow-up appointment with PCP tomorrow morning.

## 2020-07-10 NOTE — ED Notes (Signed)
Lab contacted to add on labs 

## 2020-07-10 NOTE — ED Notes (Signed)
Blood drawn and sent to lab in case MD would like labs

## 2020-07-10 NOTE — ED Provider Notes (Signed)
Emergency Department Provider Note  ____________________________________________  Time seen: Approximately 4:56 PM  I have reviewed the triage vital signs and the nursing notes.   HISTORY  Chief Complaint Fall   Historian Patient   HPI Gabriela [name] is a 86 y.o. female presents to the emergency department after having a mechanical, nonsyncopal fall 6 days ago.  Patient states that she was walking with a tray and lost her balance.  She states that she hit her head but did not lose consciousness.  She denies neck pain.  Patient states that she felt somewhat off and went to the walk-in clinic on Tuesday.  Walk-in clinic obtain basic labs diagnosed her with a UTI and patient has been taking cefdinir as directed.  Patient is accompanied by family member who states the patient lives independently and reports that cognitively patient seems at baseline.  Patient has a follow-up appointment with her PCP tomorrow morning as patient is anticipated to have a pacemaker placed and needs routine physical.  She denies current chest pain, chest tightness or abdominal pain.  No other alleviating measures have been attempted.   Past Medical History:  Diagnosis Date  . Acid reflux   . Anxiety   . Arrhythmia   . Bladder cancer ([place])   . Breast cancer ([place])   . Cancer ([place])    breast,bladder  . Depression   . Dysrhythmia    afib  . GERD (gastroesophageal reflux disease)   . History of hiatal hernia   . HOH (hard of hearing)    aids  . Hypertension   . Hyperthyroidism   . Hypothyroidism   . Stroke ([place])    2010  . TIA (transient ischemic attack)      Immunizations up to date:  Yes.     Past Medical History:  Diagnosis Date  . Acid reflux   . Anxiety   . Arrhythmia   . Bladder cancer ([place])   . Breast cancer ([place])   . Cancer ([place])    breast,bladder  . Depression   . Dysrhythmia    afib  . GERD (gastroesophageal reflux disease)   . History of hiatal hernia   . HOH (hard of  hearing)    aids  . Hypertension   . Hyperthyroidism   . Hypothyroidism   . Stroke ([place])    2010  . TIA (transient ischemic attack)     Patient Active Problem List   Diagnosis Date Noted  . Sacroiliitis ([place]) 07/12/2016  . Pain in right hip 07/12/2016    Past Surgical History:  Procedure Laterality Date  . ABDOMINAL HYSTERECTOMY    . ANKLE ARTHODESIS W/ ARTHROSCOPY    . BLADDER SURGERY    . CATARACT EXTRACTION W/PHACO Left 12/02/2014   Procedure: CATARACT EXTRACTION PHACO AND INTRAOCULAR LENS PLACEMENT (IOC);  Surgeon: Estill Cotta, MD;  Location: [facility] ORS;  Service: Ophthalmology;  Laterality: Left;  US01:45 AP%25.7 CDE42.99  . CHOLECYSTECTOMY    . EYE SURGERY     cataract  . JOINT REPLACEMENT     bil tkr  . MASTECTOMY     partial right  . REPLACEMENT TOTAL KNEE      Prior to Admission medications   Medication Sig Start Date End Date Taking? Authorizing Provider  ALPRAZolam (XANAX) 0.25 MG tablet Take 0.125 mg by mouth at bedtime as needed for anxiety.    [provider]  diltiazem (TIAZAC) 240 MG 24 hr capsule Take 240 mg by mouth daily. am  [provider]  levothyroxine (SYNTHROID, LEVOTHROID) 112 MCG tablet Take 112 mcg by mouth daily before breakfast.    [provider]  meclizine (ANTIVERT) 25 MG tablet Take 1 tablet (25 mg total) by mouth 3 (three) times daily as needed for dizziness or nausea. 04/07/17   Paulette Blanch, MD  ondansetron (ZOFRAN ODT) 4 MG disintegrating tablet Take 1 tablet (4 mg total) by mouth every 8 (eight) hours as needed for nausea or vomiting. 04/07/17   Paulette Blanch, MD  sertraline (ZOLOFT) 50 MG tablet Take 50 mg by mouth daily. hs    [provider]  warfarin (COUMADIN) 3 MG tablet Take 3 mg by mouth daily. Hs 5 days a week    [provider]  warfarin (COUMADIN) 4 MG tablet Take 4 mg by mouth daily. Hs 2 days a week    [provider]    Allergies Celebrex [celecoxib], Morphine  and related, Povidone-iodine, and Sulfa antibiotics  No family history on file.  Social History Social History   Tobacco Use  . Smoking status: Never Smoker  . Smokeless tobacco: Never Used  Substance Use Topics  . Alcohol use: No     Review of Systems  Constitutional: No fever/chills Eyes:  No discharge ENT: No upper respiratory complaints. Respiratory: no cough. No SOB/ use of accessory muscles to breath Gastrointestinal:   No nausea, no vomiting.  No diarrhea.  No constipation. Musculoskeletal: Negative for musculoskeletal pain. Neuro: No headache.  Skin: Negative for rash, abrasions, lacerations, ecchymosis.    ____________________________________________   PHYSICAL EXAM:  VITAL SIGNS: ED Triage Vitals [07/10/20 1411]  Enc Vitals Group     BP (!) 164/70     Pulse Rate 83     Resp 16     Temp 98.9 F (37.2 C)     Temp Source Oral     SpO2 99 %     Weight      Height      Head Circumference      Peak Flow      Pain Score 0     Pain Loc      Pain Edu?      Excl. in [place]?      Constitutional: Alert and oriented. Well appearing and in no acute distress. Eyes: Conjunctivae are normal. PERRL. EOMI. Head: Atraumatic. ENT:      Ears: TMs are pearly.       Nose: No congestion/rhinnorhea.      Mouth/Throat: Mucous membranes are moist.  Neck: No stridor.  Full range of motion.  No midline C-spine tenderness to palpation. Cardiovascular: Normal rate, regular rhythm. Normal S1 and S2.  Good peripheral circulation. Respiratory: Normal respiratory effort without tachypnea or retractions. Lungs CTAB. Good air entry to the bases with no decreased or absent breath sounds Gastrointestinal: Bowel sounds x 4 quadrants. Soft and nontender to palpation. No guarding or rigidity. No distention. Musculoskeletal: Full range of motion to all extremities. No obvious deformities noted Neurologic:  Normal for age. No gross focal neurologic deficits are appreciated.  Skin:  Skin is  warm, dry and intact. No rash noted. Psychiatric: Mood and affect are normal for age. Speech and behavior are normal.   ____________________________________________   LABS (all labs ordered are listed, but only abnormal results are displayed)  Labs Reviewed  CBC WITH DIFFERENTIAL/PLATELET - Abnormal; Notable for the following components:      Result Value   RBC 3.85 (*)    Hemoglobin 8.0 (*)  HCT 28.3 (*)    MCV 73.5 (*)    MCH 20.8 (*)    MCHC 28.3 (*)    RDW 17.6 (*)    All other components within normal limits  COMPREHENSIVE METABOLIC PANEL - Abnormal; Notable for the following components:   Glucose, Bld 124 (*)    All other components within normal limits   ____________________________________________  EKG   ____________________________________________  RADIOLOGY Unk Pinto, personally viewed and evaluated these images (plain radiographs) as part of my medical decision making, as well as reviewing the written report by the radiologist.    CT Head Wo Contrast  Result Date: 07/10/2020 CLINICAL DATA:  Status post fall on Saturday and struck head. No loss of consciousness. Has not felt quite right since then. EXAM: CT HEAD WITHOUT CONTRAST CT CERVICAL SPINE WITHOUT CONTRAST TECHNIQUE: Multidetector CT imaging of the head and cervical spine was performed following the standard protocol without intravenous contrast. Multiplanar CT image reconstructions of the cervical spine were also generated. COMPARISON:  CT C-spine 02/01/2010, CT head 04/06/2017 FINDINGS: CT HEAD FINDINGS Brain: Patchy and confluent areas of decreased attenuation are noted throughout the deep and periventricular white matter of the cerebral hemispheres bilaterally, compatible with chronic microvascular ischemic disease. No evidence of large-territorial acute infarction. Left frontotemporal encephalomalacia again noted. No parenchymal hemorrhage. No mass lesion. No extra-axial collection. No mass effect  or midline shift. No hydrocephalus. Basilar cisterns are patent. Vascular: No hyperdense vessel. Atherosclerotic calcifications are present within the cavernous internal carotid arteries. Skull: No acute fracture or focal lesion. Sinuses/Orbits: Paranasal sinuses and mastoid air cells are clear. The orbits are unremarkable. Other: None. CT CERVICAL SPINE FINDINGS Alignment: Normal. Skull base and vertebrae: Multilevel degenerative changes of the spine with osteophyte formation, facet arthropathy, uncovertebral arthropathy and intervertebral disc space narrowing. Worsened vertebral body height loss of the T1 vertebral body. No definite acute fracture. No aggressive appearing focal osseous lesion or focal pathologic process. Soft tissues and spinal canal: No prevertebral fluid or swelling. No visible canal hematoma. Disc levels:  Maintained. Upper chest: Unremarkable. Other: None. IMPRESSION: 1. No acute intracranial abnormality. 2. Worsened vertebral body height loss of the T1 vertebral body with greater than 40% height loss centrally. Correlate with tenderness to palpation to evaluate for an acute component. Otherwise no acute displaced fracture or traumatic listhesis of the cervical spine. Electronically Signed   By: Iven Finn M.D.   On: 07/10/2020 17:25   CT Cervical Spine Wo Contrast  Result Date: 07/10/2020 CLINICAL DATA:  Status post fall on Saturday and struck head. No loss of consciousness. Has not felt quite right since then. EXAM: CT HEAD WITHOUT CONTRAST CT CERVICAL SPINE WITHOUT CONTRAST TECHNIQUE: Multidetector CT imaging of the head and cervical spine was performed following the standard protocol without intravenous contrast. Multiplanar CT image reconstructions of the cervical spine were also generated. COMPARISON:  CT C-spine 02/01/2010, CT head 04/06/2017 FINDINGS: CT HEAD FINDINGS Brain: Patchy and confluent areas of decreased attenuation are noted throughout the deep and periventricular  white matter of the cerebral hemispheres bilaterally, compatible with chronic microvascular ischemic disease. No evidence of large-territorial acute infarction. Left frontotemporal encephalomalacia again noted. No parenchymal hemorrhage. No mass lesion. No extra-axial collection. No mass effect or midline shift. No hydrocephalus. Basilar cisterns are patent. Vascular: No hyperdense vessel. Atherosclerotic calcifications are present within the cavernous internal carotid arteries. Skull: No acute fracture or focal lesion. Sinuses/Orbits: Paranasal sinuses and mastoid air cells are clear. The orbits are unremarkable. Other:  None. CT CERVICAL SPINE FINDINGS Alignment: Normal. Skull base and vertebrae: Multilevel degenerative changes of the spine with osteophyte formation, facet arthropathy, uncovertebral arthropathy and intervertebral disc space narrowing. Worsened vertebral body height loss of the T1 vertebral body. No definite acute fracture. No aggressive appearing focal osseous lesion or focal pathologic process. Soft tissues and spinal canal: No prevertebral fluid or swelling. No visible canal hematoma. Disc levels:  Maintained. Upper chest: Unremarkable. Other: None. IMPRESSION: 1. No acute intracranial abnormality. 2. Worsened vertebral body height loss of the T1 vertebral body with greater than 40% height loss centrally. Correlate with tenderness to palpation to evaluate for an acute component. Otherwise no acute displaced fracture or traumatic listhesis of the cervical spine. Electronically Signed   By: Iven Finn M.D.   On: 07/10/2020 17:25    ____________________________________________    PROCEDURES  Procedure(s) performed:     Procedures     Medications - No data to display   ____________________________________________   INITIAL IMPRESSION / ASSESSMENT AND PLAN / ED COURSE  Pertinent labs & imaging results that were available during my care of the patient were reviewed by me  and considered in my medical decision making (see chart for details).      Assessment and Plan:  Headache Hypertension  86 year old female presents to the emergency department with a sensation of feeling off since a fall that occurred 6 days ago.  Patient was hypertensive at triage vital signs were otherwise reassuring.  No deficits were noted on neuro exam.  CBC and CMP were repeated today.  H&H is consistent with labs obtained 3 days ago.  CT head and CT cervical spine revealed no evidence of intracranial bleed or skull fracture.  No C-spine fracture on dedicated CT.  Patient was advised to continue taking antibiotics as instructed by internal medicine.  Patient has close follow-up with cardiology and her PCP.  She has an appointment with her PCP in the morning.  Advised patient and daughter to bring up hypertension noted at triage.  All patient questions were answered.       ____________________________________________  FINAL CLINICAL IMPRESSION(S) / ED DIAGNOSES  Final diagnoses:  Fall, initial encounter      NEW MEDICATIONS STARTED DURING THIS VISIT:  ED Discharge Orders    None          This chart was dictated using voice recognition software/Dragon. Despite best efforts to proofread, errors can occur which can change the meaning. Any change was purely unintentional.     Lannie Fields, PA-C 07/10/20 1750    Lucrezia Starch, MD 07/10/20 [phone]

## 2020-07-10 NOTE — ED Notes (Signed)
See triage note, pt to ED with daughter for fall last Saturday d/t tripping. States has been dizzy and not feeling right since. Hit head with fall, +blood thinners

## 2020-08-25 ENCOUNTER — Emergency Department: Payer: Medicare PPO

## 2020-08-25 ENCOUNTER — Emergency Department
Admission: EM | Admit: 2020-08-25 | Discharge: 2020-08-25 | Disposition: A | Discharge status: Home / Self Care | Payer: Medicare PPO | Attending: Emergency Medicine | Admitting: Emergency Medicine

## 2020-08-25 ENCOUNTER — Encounter: Payer: Self-pay | Admitting: Emergency Medicine

## 2020-08-25 ENCOUNTER — Other Ambulatory Visit: Payer: Self-pay

## 2020-08-25 DIAGNOSIS — R519 Headache, unspecified: Secondary | ICD-10-CM | POA: Diagnosis not present

## 2020-08-25 DIAGNOSIS — Z853 Personal history of malignant neoplasm of breast: Secondary | ICD-10-CM | POA: Diagnosis not present

## 2020-08-25 DIAGNOSIS — Z7901 Long term (current) use of anticoagulants: Secondary | ICD-10-CM | POA: Diagnosis not present

## 2020-08-25 DIAGNOSIS — Z96653 Presence of artificial knee joint, bilateral: Secondary | ICD-10-CM | POA: Diagnosis not present

## 2020-08-25 DIAGNOSIS — W101XXA Fall (on)(from) sidewalk curb, initial encounter: Secondary | ICD-10-CM | POA: Insufficient documentation

## 2020-08-25 DIAGNOSIS — I10 Essential (primary) hypertension: Secondary | ICD-10-CM | POA: Insufficient documentation

## 2020-08-25 DIAGNOSIS — E039 Hypothyroidism, unspecified: Secondary | ICD-10-CM | POA: Diagnosis not present

## 2020-08-25 DIAGNOSIS — Z79899 Other long term (current) drug therapy: Secondary | ICD-10-CM | POA: Diagnosis not present

## 2020-08-25 DIAGNOSIS — Z8551 Personal history of malignant neoplasm of bladder: Secondary | ICD-10-CM | POA: Diagnosis not present

## 2020-08-25 DIAGNOSIS — S42034A Nondisplaced fracture of lateral end of right clavicle, initial encounter for closed fracture: Secondary | ICD-10-CM | POA: Insufficient documentation

## 2020-08-25 DIAGNOSIS — S4991XA Unspecified injury of right shoulder and upper arm, initial encounter: Secondary | ICD-10-CM | POA: Diagnosis present

## 2020-08-25 MED ORDER — ACETAMINOPHEN 325 MG PO TABS
650.0000 mg | ORAL_TABLET | Freq: Once | ORAL | Status: AC
  Administered 2020-08-25: 650 mg via ORAL
  Filled 2020-08-25: qty 2

## 2020-08-25 MED ORDER — LIDOCAINE 5 % EX PTCH
1.0000 | MEDICATED_PATCH | Freq: Once | CUTANEOUS | Status: DC
  Administered 2020-08-25: 1 via TRANSDERMAL
  Filled 2020-08-25: qty 1

## 2020-08-25 MED ORDER — LIDOCAINE 5 % EX PTCH: 1.0000 | MEDICATED_PATCH | CUTANEOUS | 0 refills | Status: DC

## 2020-08-25 NOTE — ED Triage Notes (Signed)
Mechanical fall. Stepped back off of curb and fell, hitting head.  No LOC.  C/O right shoulder pain.  Patient is aAOx3.  Skin warm and dry. NAD

## 2020-08-25 NOTE — ED Notes (Signed)
Pt signed esignature.  Clavicle strap applied.  Ron pa-c in with pt again at discharge.  Pt alert

## 2020-08-25 NOTE — ED Provider Notes (Signed)
[facility] Emergency Department Gabriela [name] Note   ____________________________________________   Event Date/Time   First MD Initiated Contact with Patient 08/25/20 1319     (approximate)  I have reviewed the triage vital signs and the nursing notes.   HISTORY  Chief Complaint Fall    HPI Gabriela [name] is a 87 y.o. female patient complain of left shoulder pain secondary to a fall.  Mechanical fall stepping off a curb.  Patient fell backwards hitting her head.  Patient denies LOC or headache.  Patient chief complaint if left shoulder pain.  Patient rates pain as a 4/10.  Patient described pain as "achy".  No palliative measure prior to arrival.         Past Medical History:  Diagnosis Date  . Acid reflux   . Anxiety   . Arrhythmia   . Bladder cancer ([place])   . Breast cancer ([place])   . Cancer ([place])    breast,bladder  . Depression   . Dysrhythmia    afib  . GERD (gastroesophageal reflux disease)   . History of hiatal hernia   . HOH (hard of hearing)    aids  . Hypertension   . Hyperthyroidism   . Hypothyroidism   . Stroke ([place])    2010  . TIA (transient ischemic attack)     Patient Active Problem List   Diagnosis Date Noted  . Sacroiliitis ([place]) 07/12/2016  . Pain in right hip 07/12/2016    Past Surgical History:  Procedure Laterality Date  . ABDOMINAL HYSTERECTOMY    . ANKLE ARTHODESIS W/ ARTHROSCOPY    . BLADDER SURGERY    . CATARACT EXTRACTION W/PHACO Left 12/02/2014   Procedure: CATARACT EXTRACTION PHACO AND INTRAOCULAR LENS PLACEMENT (IOC);  Surgeon: Estill Cotta, MD;  Location: [facility] ORS;  Service: Ophthalmology;  Laterality: Left;  US01:45 AP%25.7 CDE42.99  . CHOLECYSTECTOMY    . EYE SURGERY     cataract  . JOINT REPLACEMENT     bil tkr  . MASTECTOMY     partial right  . REPLACEMENT TOTAL KNEE      Prior to Admission medications   Medication Sig Start Date End Date Taking? Authorizing Zarya Lasseigne  lidocaine  (LIDODERM) 5 % Place 1 patch onto the skin daily. Remove & Discard patch within 12 hours or as directed by MD 08/25/20 08/25/21 Yes Sable Feil, PA-C  ALPRAZolam Duanne Moron) 0.25 MG tablet Take 0.125 mg by mouth at bedtime as needed for anxiety.    Keymari Sato, Historical, MD  diltiazem (TIAZAC) 240 MG 24 hr capsule Take 240 mg by mouth daily. am    Karyssa Amaral, Historical, MD  levothyroxine (SYNTHROID, LEVOTHROID) 112 MCG tablet Take 112 mcg by mouth daily before breakfast.    Dannon Perlow, Historical, MD  meclizine (ANTIVERT) 25 MG tablet Take 1 tablet (25 mg total) by mouth 3 (three) times daily as needed for dizziness or nausea. 04/07/17   Paulette Blanch, MD  ondansetron (ZOFRAN ODT) 4 MG disintegrating tablet Take 1 tablet (4 mg total) by mouth every 8 (eight) hours as needed for nausea or vomiting. 04/07/17   Paulette Blanch, MD  sertraline (ZOLOFT) 50 MG tablet Take 50 mg by mouth daily. hs    Jamie-Lee Galdamez, Historical, MD  warfarin (COUMADIN) 3 MG tablet Take 3 mg by mouth daily. Hs 5 days a week    Demetrus Pavao, Historical, MD  warfarin (COUMADIN) 4 MG tablet Take 4 mg by mouth daily. Hs 2 days a week  Treson Laura, Historical, MD    Allergies Celebrex [celecoxib], Morphine and related, Povidone-iodine, and Sulfa antibiotics  No family history on file.  Social History Social History   Tobacco Use  . Smoking status: Never Smoker  . Smokeless tobacco: Never Used  Substance Use Topics  . Alcohol use: No    Review of Systems Constitutional: No fever/chills Eyes: No visual changes. ENT: No sore throat. Cardiovascular: Denies chest pain. Respiratory: Denies shortness of breath. Gastrointestinal: No abdominal pain.  No nausea, no vomiting.  No diarrhea.  No constipation. Genitourinary: Negative for dysuria. Musculoskeletal: Right shoulder pain. Skin: Negative for rash. Neurological: Negative for headaches, focal weakness or numbness. Psychiatric:  Anxiety depression. Endocrine:  Hypertension and  hypothyroidism. Hematological/Lymphatic:  Allergic/Immunilogical: Celebrex, morphine, iodine, and sulfur antibiotics. ____________________________________________   PHYSICAL EXAM:  VITAL SIGNS: ED Triage Vitals  Enc Vitals Group     BP 08/25/20 1100 (!) 177/93     Pulse Rate 08/25/20 1100 81     Resp 08/25/20 1100 18     Temp 08/25/20 1100 98.3 F (36.8 C)     Temp Source 08/25/20 1100 Oral     SpO2 08/25/20 1100 97 %     Weight 08/25/20 0950 156 lb 15.5 oz (71.2 kg)     Height 08/25/20 0950 5\' 3"  (1.6 m)     Head Circumference --      Peak Flow --      Pain Score 08/25/20 0950 4     Pain Loc --      Pain Edu? --      Excl. in [place]? --    Constitutional: Alert and oriented. Well appearing and in no acute distress. Eyes: Conjunctivae are normal. PERRL. EOMI. Head: Atraumatic.  No hematoma. Nose: No congestion/rhinnorhea. Mouth/Throat: Mucous membranes are moist.  Oropharynx non-erythematous. Neck:   No cervical spine tenderness to palpation. Cardiovascular: Normal rate, regular rhythm. Grossly normal heart sounds.  Good peripheral circulation. Respiratory: Normal respiratory effort.  No retractions. Lungs CTAB. Gastrointestinal: Soft and nontender. No distention. No abdominal bruits. No CVA tenderness. Genitourinary: Deferred Musculoskeletal: No deformity to the right shoulder.  Patient has moderate guarding palpation of the distal right clavicle. Neurologic:  Normal speech and language. No gross focal neurologic deficits are appreciated. No gait instability. Skin:  Skin is warm, dry and intact. No rash noted. Psychiatric: Mood and affect are normal. Speech and behavior are normal.  ____________________________________________   LABS (all labs ordered are listed, but only abnormal results are displayed)  Labs Reviewed - No data to display ____________________________________________  EKG   ____________________________________________  RADIOLOGY I, Sable Feil,  personally viewed and evaluated these images (plain radiographs) as part of my medical decision making, as well as reviewing the written report by the radiologist.  ED MD interpretation: X-ray revealed distal right clavicle fracture.  Official radiology report(s): DG Clavicle Right  Result Date: 08/25/2020 CLINICAL DATA:  Fall with shoulder pain. EXAM: RIGHT CLAVICLE - 2+ VIEWS COMPARISON:  None. FINDINGS: Acute fracture of the distal clavicle. Distal fragment remains normally located relative to the [facility] joint. Mild separation of the 2 main fracture fragments. IMPRESSION: Acute fracture of the distal clavicle. Electronically Signed   By: Nelson Chimes M.D.   On: 08/25/2020 13:54   DG Shoulder Right  Result Date: 08/25/2020 CLINICAL DATA:  Fall, right shoulder pain EXAM: RIGHT SHOULDER - 2+ VIEW COMPARISON:  None. FINDINGS: No fracture or dislocation of the right shoulder. Mild glenohumeral degenerative changes. Mild degenerative changes of the  acromioclavicular joint. However, on the transscapular Y-view, there is a possible distal clavicle/clavicular head fracture. Dedicated right clavicle radiographs are suggested. Visualized right lung is clear. IMPRESSION: Possible distal clavicle/clavicular head fracture, equivocal. Dedicated right clavicle radiographs are suggested. No fracture or dislocation of the right shoulder. Electronically Signed   By: Julian Hy M.D.   On: 08/25/2020 12:14   CT Head Wo Contrast  Result Date: 08/25/2020 CLINICAL DATA:  Head pain after fall EXAM: CT HEAD WITHOUT CONTRAST TECHNIQUE: Contiguous axial images were obtained from the base of the skull through the vertex without intravenous contrast. COMPARISON:  07/10/2020 FINDINGS: Brain: No evidence of acute infarction, hemorrhage, hydrocephalus, extra-axial collection or mass lesion/mass effect. Chronic encephalomalacia in the left frontotemporal region. Moderate low-density changes within the periventricular and  subcortical white matter compatible with chronic microvascular ischemic change. Mild diffuse cerebral volume loss. Vascular: Atherosclerotic calcifications involving the large vessels of the skull base. No unexpected hyperdense vessel. Skull: Stable right parietal calvarial hemangioma. Negative for acute calvarial fracture. Sinuses/Orbits: No acute finding. Other: Negative for scalp hematoma. IMPRESSION: 1. No acute intracranial findings. 2. Chronic microvascular ischemic change and cerebral volume loss. Electronically Signed   By: Davina Poke D.O.   On: 08/25/2020 12:29    ____________________________________________   PROCEDURES  Procedure(s) performed (including Critical Care):  Procedures   ____________________________________________   INITIAL IMPRESSION / ASSESSMENT AND PLAN / ED COURSE  As part of my medical decision making, I reviewed the following data within the [place]         Patient presents with right shoulder pain secondary to mechanical fall prior to arrival.  Patient denies LOC or head injury.  Discussed no acute findings on CT of the head.  Patient has a distal right clavicle fracture.  Patient placed in a clavicle strap and given discharge care instructions.  Patient follow orthopedic for definitive evaluation and treatment.      ____________________________________________   FINAL CLINICAL IMPRESSION(S) / ED DIAGNOSES  Final diagnoses:  Nondisplaced fracture of lateral end of right clavicle, initial encounter for closed fracture     ED Discharge Orders         Ordered    lidocaine (LIDODERM) 5 %  Every 24 hours        08/25/20 1403          *Please note:  Gabriela [name] was evaluated in Emergency Department on 08/25/2020 for the symptoms described in the history of present illness. She was evaluated in the context of the global COVID-19 pandemic, which necessitated consideration that the patient might be at risk for infection  with the SARS-CoV-2 virus that causes COVID-19. Institutional protocols and algorithms that pertain to the evaluation of patients at risk for COVID-19 are in a state of rapid change based on information released by regulatory bodies including the CDC and federal and state organizations. These policies and algorithms were followed during the patient's care in the ED.  Some ED evaluations and interventions may be delayed as a result of limited staffing during and the pandemic.*   Note:  This document was prepared using Dragon voice recognition software and may include unintentional dictation errors.    Sable Feil, PA-C 08/25/20 1408    Blake Divine, MD 08/26/20 1116

## 2020-08-25 NOTE — Discharge Instructions (Signed)
Follow discharge care instruction Wear clavicle strap as directed until evaluation by orthopedics.  May take extra strength Tylenol as needed for pain.

## 2021-04-01 ENCOUNTER — Inpatient Hospital Stay
Admission: EM | Admit: 2021-04-01 | Discharge: 2021-04-22 | DRG: 330 | Disposition: A | Discharge status: Skilled Nursing Facility | Payer: Medicare PPO | Attending: Family Medicine | Admitting: Family Medicine

## 2021-04-01 ENCOUNTER — Emergency Department: Payer: Medicare PPO

## 2021-04-01 ENCOUNTER — Other Ambulatory Visit: Payer: Self-pay

## 2021-04-01 DIAGNOSIS — D630 Anemia in neoplastic disease: Secondary | ICD-10-CM | POA: Diagnosis present

## 2021-04-01 DIAGNOSIS — E871 Hypo-osmolality and hyponatremia: Secondary | ICD-10-CM | POA: Diagnosis not present

## 2021-04-01 DIAGNOSIS — Z888 Allergy status to other drugs, medicaments and biological substances status: Secondary | ICD-10-CM

## 2021-04-01 DIAGNOSIS — I2510 Atherosclerotic heart disease of native coronary artery without angina pectoris: Secondary | ICD-10-CM | POA: Diagnosis present

## 2021-04-01 DIAGNOSIS — C184 Malignant neoplasm of transverse colon: Principal | ICD-10-CM | POA: Diagnosis present

## 2021-04-01 DIAGNOSIS — I119 Hypertensive heart disease without heart failure: Secondary | ICD-10-CM | POA: Diagnosis present

## 2021-04-01 DIAGNOSIS — Z66 Do not resuscitate: Secondary | ICD-10-CM | POA: Diagnosis present

## 2021-04-01 DIAGNOSIS — J90 Pleural effusion, not elsewhere classified: Secondary | ICD-10-CM

## 2021-04-01 DIAGNOSIS — F419 Anxiety disorder, unspecified: Secondary | ICD-10-CM | POA: Diagnosis present

## 2021-04-01 DIAGNOSIS — Y838 Other surgical procedures as the cause of abnormal reaction of the patient, or of later complication, without mention of misadventure at the time of the procedure: Secondary | ICD-10-CM | POA: Diagnosis not present

## 2021-04-01 DIAGNOSIS — R1084 Generalized abdominal pain: Secondary | ICD-10-CM | POA: Diagnosis not present

## 2021-04-01 DIAGNOSIS — Z8673 Personal history of transient ischemic attack (TIA), and cerebral infarction without residual deficits: Secondary | ICD-10-CM

## 2021-04-01 DIAGNOSIS — F32A Depression, unspecified: Secondary | ICD-10-CM | POA: Diagnosis present

## 2021-04-01 DIAGNOSIS — K5900 Constipation, unspecified: Secondary | ICD-10-CM

## 2021-04-01 DIAGNOSIS — K219 Gastro-esophageal reflux disease without esophagitis: Secondary | ICD-10-CM | POA: Diagnosis present

## 2021-04-01 DIAGNOSIS — Z882 Allergy status to sulfonamides status: Secondary | ICD-10-CM

## 2021-04-01 DIAGNOSIS — E039 Hypothyroidism, unspecified: Secondary | ICD-10-CM | POA: Diagnosis present

## 2021-04-01 DIAGNOSIS — T8182XA Emphysema (subcutaneous) resulting from a procedure, initial encounter: Secondary | ICD-10-CM | POA: Diagnosis not present

## 2021-04-01 DIAGNOSIS — R791 Abnormal coagulation profile: Secondary | ICD-10-CM | POA: Diagnosis present

## 2021-04-01 DIAGNOSIS — E876 Hypokalemia: Secondary | ICD-10-CM | POA: Diagnosis not present

## 2021-04-01 DIAGNOSIS — R140 Abdominal distension (gaseous): Secondary | ICD-10-CM

## 2021-04-01 DIAGNOSIS — I482 Chronic atrial fibrillation, unspecified: Secondary | ICD-10-CM | POA: Diagnosis present

## 2021-04-01 DIAGNOSIS — K56609 Unspecified intestinal obstruction, unspecified as to partial versus complete obstruction: Secondary | ICD-10-CM | POA: Diagnosis present

## 2021-04-01 DIAGNOSIS — Q438 Other specified congenital malformations of intestine: Secondary | ICD-10-CM

## 2021-04-01 DIAGNOSIS — I639 Cerebral infarction, unspecified: Secondary | ICD-10-CM | POA: Diagnosis present

## 2021-04-01 DIAGNOSIS — H9190 Unspecified hearing loss, unspecified ear: Secondary | ICD-10-CM | POA: Diagnosis present

## 2021-04-01 DIAGNOSIS — Z79890 Hormone replacement therapy (postmenopausal): Secondary | ICD-10-CM

## 2021-04-01 DIAGNOSIS — B370 Candidal stomatitis: Secondary | ICD-10-CM | POA: Diagnosis present

## 2021-04-01 DIAGNOSIS — Z20822 Contact with and (suspected) exposure to covid-19: Secondary | ICD-10-CM | POA: Diagnosis present

## 2021-04-01 DIAGNOSIS — K567 Ileus, unspecified: Secondary | ICD-10-CM

## 2021-04-01 DIAGNOSIS — R32 Unspecified urinary incontinence: Secondary | ICD-10-CM | POA: Diagnosis present

## 2021-04-01 DIAGNOSIS — R188 Other ascites: Secondary | ICD-10-CM | POA: Diagnosis not present

## 2021-04-01 DIAGNOSIS — Z9011 Acquired absence of right breast and nipple: Secondary | ICD-10-CM

## 2021-04-01 DIAGNOSIS — Z7901 Long term (current) use of anticoagulants: Secondary | ICD-10-CM

## 2021-04-01 DIAGNOSIS — I10 Essential (primary) hypertension: Secondary | ICD-10-CM | POA: Diagnosis present

## 2021-04-01 DIAGNOSIS — F418 Other specified anxiety disorders: Secondary | ICD-10-CM | POA: Diagnosis not present

## 2021-04-01 DIAGNOSIS — R0602 Shortness of breath: Secondary | ICD-10-CM

## 2021-04-01 DIAGNOSIS — D649 Anemia, unspecified: Secondary | ICD-10-CM | POA: Diagnosis not present

## 2021-04-01 DIAGNOSIS — K5730 Diverticulosis of large intestine without perforation or abscess without bleeding: Secondary | ICD-10-CM | POA: Diagnosis not present

## 2021-04-01 DIAGNOSIS — R300 Dysuria: Secondary | ICD-10-CM | POA: Diagnosis present

## 2021-04-01 DIAGNOSIS — I700 Atherosclerosis of aorta: Secondary | ICD-10-CM | POA: Diagnosis present

## 2021-04-01 DIAGNOSIS — Z885 Allergy status to narcotic agent status: Secondary | ICD-10-CM

## 2021-04-01 DIAGNOSIS — K9413 Enterostomy malfunction: Secondary | ICD-10-CM | POA: Diagnosis not present

## 2021-04-01 DIAGNOSIS — Z853 Personal history of malignant neoplasm of breast: Secondary | ICD-10-CM | POA: Diagnosis not present

## 2021-04-01 DIAGNOSIS — Z8551 Personal history of malignant neoplasm of bladder: Secondary | ICD-10-CM

## 2021-04-01 DIAGNOSIS — R109 Unspecified abdominal pain: Secondary | ICD-10-CM | POA: Diagnosis present

## 2021-04-01 DIAGNOSIS — Z95828 Presence of other vascular implants and grafts: Secondary | ICD-10-CM

## 2021-04-01 DIAGNOSIS — Z79899 Other long term (current) drug therapy: Secondary | ICD-10-CM

## 2021-04-01 DIAGNOSIS — Z7189 Other specified counseling: Secondary | ICD-10-CM | POA: Diagnosis not present

## 2021-04-01 DIAGNOSIS — Z96653 Presence of artificial knee joint, bilateral: Secondary | ICD-10-CM | POA: Diagnosis present

## 2021-04-01 LAB — COMPREHENSIVE METABOLIC PANEL
ALT: 8 U/L (ref 0–44)
AST: 18 U/L (ref 15–41)
Albumin: 3.5 g/dL (ref 3.5–5.0)
Alkaline Phosphatase: 70 U/L (ref 38–126)
Anion gap: 11 (ref 5–15)
BUN: 9 mg/dL (ref 8–23)
CO2: 25 mmol/L (ref 22–32)
Calcium: 9.1 mg/dL (ref 8.9–10.3)
Chloride: 102 mmol/L (ref 98–111)
Creatinine, Ser: 0.62 mg/dL (ref 0.44–1.00)
GFR, Estimated: >60 mL/min (ref >60)
Glucose, Bld: 129 mg/dL — ABNORMAL HIGH (ref 70–99)
Potassium: 2.7 mmol/L — CL (ref 3.5–5.1)
Sodium: 138 mmol/L (ref 135–145)
Total Bilirubin: 0.8 mg/dL (ref 0.3–1.2)
Total Protein: 7.3 g/dL (ref 6.5–8.1)

## 2021-04-01 LAB — CBC
HCT: 31.1 % — ABNORMAL LOW (ref 36.0–46.0)
Hemoglobin: 10.3 g/dL — ABNORMAL LOW (ref 12.0–15.0)
MCH: 26.9 pg (ref 26.0–34.0)
MCHC: 33.1 g/dL (ref 30.0–36.0)
MCV: 81.2 fL (ref 80.0–100.0)
Platelets: 334 10*3/uL (ref 150–400)
RBC: 3.83 MIL/uL — ABNORMAL LOW (ref 3.87–5.11)
RDW: 13.9 % (ref 11.5–15.5)
WBC: 7.4 10*3/uL (ref 4.0–10.5)
nRBC: 0 % (ref 0.0–0.2)

## 2021-04-01 LAB — TYPE AND SCREEN
ABO/RH(D): A POS
Antibody Screen: NEGATIVE

## 2021-04-01 LAB — RESP PANEL BY RT-PCR (FLU A&B, COVID) ARPGX2
Influenza A by PCR: NEGATIVE
Influenza B by PCR: NEGATIVE
SARS Coronavirus 2 by RT PCR: NEGATIVE

## 2021-04-01 LAB — APTT: aPTT: 53 seconds — ABNORMAL HIGH (ref 24–36)

## 2021-04-01 LAB — MAGNESIUM: Magnesium: 2.1 mg/dL (ref 1.7–2.4)

## 2021-04-01 LAB — PROTIME-INR
INR: 2.4 — ABNORMAL HIGH (ref 0.8–1.2)
Prothrombin Time: 26.2 seconds — ABNORMAL HIGH (ref 11.4–15.2)

## 2021-04-01 LAB — LIPASE, BLOOD: Lipase: 26 U/L (ref 11–51)

## 2021-04-01 MED ORDER — FENTANYL CITRATE PF 50 MCG/ML IJ SOSY: 12.5000 ug | PREFILLED_SYRINGE | INTRAMUSCULAR | Status: DC | PRN

## 2021-04-01 MED ORDER — MAGNESIUM SULFATE IN D5W 1-5 GM/100ML-% IV SOLN
1.0000 g | Freq: Once | INTRAVENOUS | Status: AC
  Administered 2021-04-01: 1 g via INTRAVENOUS
  Filled 2021-04-01: qty 100

## 2021-04-01 MED ORDER — POTASSIUM CHLORIDE 10 MEQ/100ML IV SOLN
10.0000 meq | INTRAVENOUS | Status: AC
  Administered 2021-04-01 (×3): 10 meq via INTRAVENOUS
  Filled 2021-04-01 (×4): qty 100

## 2021-04-01 MED ORDER — PANTOPRAZOLE SODIUM 40 MG PO TBEC
40.0000 mg | DELAYED_RELEASE_TABLET | Freq: Every day | ORAL | Status: DC
  Administered 2021-04-01 – 2021-04-12 (×10): 40 mg via ORAL
  Filled 2021-04-01 (×12): qty 1

## 2021-04-01 MED ORDER — ALBUTEROL SULFATE (2.5 MG/3ML) 0.083% IN NEBU: 2.5000 mg | INHALATION_SOLUTION | RESPIRATORY_TRACT | Status: DC | PRN

## 2021-04-01 MED ORDER — MECLIZINE HCL 25 MG PO TABS
25.0000 mg | ORAL_TABLET | Freq: Three times a day (TID) | ORAL | Status: DC | PRN
  Filled 2021-04-01 (×2): qty 1

## 2021-04-01 MED ORDER — AMLODIPINE BESYLATE 5 MG PO TABS
5.0000 mg | ORAL_TABLET | Freq: Every day | ORAL | Status: DC
  Administered 2021-04-01 – 2021-04-22 (×19): 5 mg via ORAL
  Filled 2021-04-01 (×21): qty 1

## 2021-04-01 MED ORDER — OMEPRAZOLE MAGNESIUM 20 MG PO TBEC
20.0000 mg | DELAYED_RELEASE_TABLET | Freq: Every day | ORAL | Status: DC
  Filled 2021-04-01: qty 1

## 2021-04-01 MED ORDER — ONDANSETRON HCL 4 MG/2ML IJ SOLN
4.0000 mg | Freq: Three times a day (TID) | INTRAMUSCULAR | Status: DC | PRN
  Administered 2021-04-02 – 2021-04-18 (×6): 4 mg via INTRAVENOUS
  Filled 2021-04-01 (×6): qty 2

## 2021-04-01 MED ORDER — LEVOTHYROXINE SODIUM 112 MCG PO TABS
112.0000 ug | ORAL_TABLET | Freq: Every day | ORAL | Status: DC
  Administered 2021-04-02 – 2021-04-22 (×17): 112 ug via ORAL
  Filled 2021-04-01 (×23): qty 1

## 2021-04-01 MED ORDER — ONDANSETRON HCL 4 MG/2ML IJ SOLN
4.0000 mg | Freq: Three times a day (TID) | INTRAMUSCULAR | Status: DC | PRN
Start: 2021-04-01 — End: 2021-04-01

## 2021-04-01 MED ORDER — SODIUM CHLORIDE 0.9 % IV SOLN: INTRAVENOUS | Status: DC

## 2021-04-01 MED ORDER — DILTIAZEM HCL ER BEADS 240 MG PO CP24
240.0000 mg | ORAL_CAPSULE | Freq: Every day | ORAL | Status: DC
  Filled 2021-04-01: qty 1
  Filled 2021-04-01: qty 2

## 2021-04-01 MED ORDER — BISACODYL 10 MG RE SUPP: 10.0000 mg | Freq: Every day | RECTAL | Status: DC | PRN

## 2021-04-01 MED ORDER — IOHEXOL 350 MG/ML SOLN
80.0000 mL | Freq: Once | INTRAVENOUS | Status: AC | PRN
  Administered 2021-04-01: 80 mL via INTRAVENOUS

## 2021-04-01 MED ORDER — BISACODYL 10 MG RE SUPP
10.0000 mg | Freq: Every day | RECTAL | Status: AC | PRN
  Filled 2021-04-01: qty 1

## 2021-04-01 MED ORDER — ACETAMINOPHEN 325 MG PO TABS
650.0000 mg | ORAL_TABLET | Freq: Four times a day (QID) | ORAL | Status: DC | PRN
  Administered 2021-04-01 – 2021-04-22 (×6): 650 mg via ORAL
  Filled 2021-04-01 (×5): qty 2

## 2021-04-01 MED ORDER — SERTRALINE HCL 50 MG PO TABS
50.0000 mg | ORAL_TABLET | Freq: Every day | ORAL | Status: DC
  Administered 2021-04-01 – 2021-04-21 (×20): 50 mg via ORAL
  Filled 2021-04-01 (×20): qty 1

## 2021-04-01 MED ORDER — POLYETHYLENE GLYCOL 3350 17 G PO PACK
17.0000 g | PACK | Freq: Every day | ORAL | Status: DC
Start: 2021-04-01 — End: 2021-04-09
  Administered 2021-04-07 – 2021-04-09 (×3): 17 g via ORAL
  Filled 2021-04-01 (×5): qty 1

## 2021-04-01 MED ORDER — POTASSIUM CHLORIDE CRYS ER 20 MEQ PO TBCR
40.0000 meq | EXTENDED_RELEASE_TABLET | Freq: Once | ORAL | Status: AC
  Administered 2021-04-01: 40 meq via ORAL
  Filled 2021-04-01: qty 2

## 2021-04-01 MED ORDER — ALPRAZOLAM 0.25 MG PO TABS
0.1250 mg | ORAL_TABLET | Freq: Every evening | ORAL | Status: DC | PRN
  Administered 2021-04-01 – 2021-04-21 (×5): 0.125 mg via ORAL
  Filled 2021-04-01 (×5): qty 1

## 2021-04-01 MED ORDER — BISACODYL 10 MG RE SUPP
10.0000 mg | Freq: Once | RECTAL | Status: DC
  Filled 2021-04-01: qty 1

## 2021-04-01 MED ORDER — HYDRALAZINE HCL 20 MG/ML IJ SOLN: 5.0000 mg | INTRAMUSCULAR | Status: DC | PRN

## 2021-04-01 NOTE — H&P (Signed)
History and Physical    Gabriela [name] [id number] DOB: [birth year] DOA: 04/01/2021  Referring MD/NP/PA:   PCP: [name], MD   Patient coming from:  The patient is coming from home.     Chief Complaint: Abdominal pain, constipation  HPI: Gabriela [name] is a 87 y.o. female with medical history significant of hypertension, stroke, TIA, GERD, hypothyroidism, depression with anxiety, hard of hearing, hiatal hernia, atrial fibrillation on Coumadin, breast cancer, bladder cancer, anemia, who presents with abdominal pain and constipation.  Patient states that she has been having abdominal pain and constipation for more than 3 days.  She states that her abdominal pain is diffuse, initially moderate to severe, currently mild, associated with abdominal distention.  Patient has nausea, no vomiting or diarrhea.  She did not have bowel movement for more than 3 days.  No fever or chills.  Patient has chronic mild shortness breath, no cough, chest pain.  No symptoms of UTI.  Patient states that she took last dose of Coumadin last night.  ED Course: pt was found to have WBC 7.4, pending COVID-19 PCR, potassium 2.7, renal function okay, temperature normal, blood pressure 150/91, heart rate 91, RR 18, oxygen saturation 97% on room air.  Patient is admitted to [place] bed as inpatient.  Dr. Alice Reichert for GI and Dr. Lucila Maine and PA Olean Ree of surgery are consulted.  CT-abd/pelvis showed: 1. Abnormal large bowel. There is an abrupt caliber change in the distal transverse colon (series 2, image 36) with upstream colon distended with stool and appearing mildly inflamed. An obstructing Apple Core Tumor is not be excluded. Follow-up Endoscopy, or failing that Virtual Colonoscopy, is recommended. But from that lesion to the splenic flexure there is also somewhat generalized bowel wall thickening which would be more typical of colitis. Underlying large bowel diverticula, severe in the sigmoid colon,  but no active diverticulitis.   2. No free air. Trace free fluid in the pelvis is felt secondary to #1.   3. Progressed Cardiomegaly and hiatal hernia since 2010, now with largely intrathoracic stomach.   4. Aortic Atherosclerosis (ICD10-I70.0).     Review of Systems:   General: no fevers, chills, no body weight gain, has poor appetite, has fatigue HEENT: no blurry vision, hearing changes or sore throat Respiratory: has mild dyspnea, no coughing, wheezing CV: no chest pain, no palpitations GI: has nausea, abdominal pain, abdominal distention, no diarrhea or vomiting.  GU: no dysuria, burning on urination, increased urinary frequency, hematuria  Ext: no leg edema Neuro: no unilateral weakness, numbness, or tingling, no vision change or hearing loss Skin: no rash, no skin tear. MSK: No muscle spasm, no deformity, no limitation of range of movement in spin Heme: No easy bruising.  Travel history: No recent long distant travel.  Allergy:  Allergies  Allergen Reactions   Celebrex [Celecoxib] Anaphylaxis   Morphine And Related Swelling    Lips and mouth   Povidone-Iodine Other (See Comments)    Other reaction(s): ITCHING Other reaction(s): ITCHING    Sulfa Antibiotics     Past Medical History:  Diagnosis Date   Acid reflux    Anxiety    Arrhythmia    Bladder cancer (HCC)    Breast cancer (HCC)    Cancer (HCC)    breast,bladder   Depression    Dysrhythmia    afib   GERD (gastroesophageal reflux disease)    History of hiatal hernia    HOH (hard of hearing)    aids  Hypertension    Hyperthyroidism    Hypothyroidism    Stroke [facility])    2010   TIA (transient ischemic attack)     Past Surgical History:  Procedure Laterality Date   ABDOMINAL HYSTERECTOMY     ANKLE ARTHODESIS W/ ARTHROSCOPY     BLADDER SURGERY     CATARACT EXTRACTION W/PHACO Left 12/02/2014   Procedure: CATARACT EXTRACTION PHACO AND INTRAOCULAR LENS PLACEMENT ([place]);  Surgeon: Estill Cotta,  MD;  Location: [facility] ORS;  Service: Ophthalmology;  Laterality: Left;  US01:45 AP%25.7 CDE42.99   CHOLECYSTECTOMY     EYE SURGERY     cataract   JOINT REPLACEMENT     bil tkr   MASTECTOMY     partial right   REPLACEMENT TOTAL KNEE      Social History:  reports that she has never smoked. She has never used smokeless tobacco. She reports that she does not drink alcohol and does not use drugs.  Family History:  unknown, pt is adopted Family History  Adopted: Yes     Prior to Admission medications   Medication Sig Start Date End Date Taking? Authorizing Provider  ALPRAZolam (XANAX) 0.25 MG tablet Take 0.125 mg by mouth at bedtime as needed for anxiety.    [provider]  diltiazem (TIAZAC) 240 MG 24 hr capsule Take 240 mg by mouth daily. am    [provider]  levothyroxine (SYNTHROID, LEVOTHROID) 112 MCG tablet Take 112 mcg by mouth daily before breakfast.    [provider]  lidocaine (LIDODERM) 5 % Place 1 patch onto the skin daily. Remove & Discard patch within 12 hours or as directed by MD 08/25/20 08/25/21  Sable Feil, PA-C  meclizine (ANTIVERT) 25 MG tablet Take 1 tablet (25 mg total) by mouth 3 (three) times daily as needed for dizziness or nausea. 04/07/17   Paulette Blanch, MD  ondansetron (ZOFRAN ODT) 4 MG disintegrating tablet Take 1 tablet (4 mg total) by mouth every 8 (eight) hours as needed for nausea or vomiting. 04/07/17   Paulette Blanch, MD  sertraline (ZOLOFT) 50 MG tablet Take 50 mg by mouth daily. hs    [provider]  warfarin (COUMADIN) 3 MG tablet Take 3 mg by mouth daily. Hs 5 days a week    [provider]  warfarin (COUMADIN) 4 MG tablet Take 4 mg by mouth daily. Hs 2 days a week    [provider]    Physical Exam: Vitals:   04/01/21 1030 04/01/21 1045 04/01/21 1100 04/01/21 1130  BP: (!) 144/71  (!) 150/70 (!) 154/91  Pulse: 72  73 72  Resp: '16 18  17  '$ Temp:      TempSrc:      SpO2: 100%  100% 100%   Weight:      Height:       General: Not in acute distress HEENT:       Eyes: PERRL, EOMI, no scleral icterus.       ENT: No discharge from the ears and nose, no pharynx injection, no tonsillar enlargement.        Neck: No JVD, no bruit, no mass felt. Heme: No neck lymph node enlargement. Cardiac: S1/S2, RRR, No murmurs, No gallops or rubs. Respiratory: No rales, wheezing, rhonchi or rubs. GI: distended, with diffused tenderness, no rebound pain, no organomegaly, BS present. GU: No hematuria Ext: No pitting leg edema bilaterally. 1+DP/PT pulse bilaterally. Musculoskeletal: No joint deformities, No joint redness or warmth, no  limitation of ROM in spin. Skin: No rashes.  Neuro: Alert, oriented X3, cranial nerves II-XII grossly intact, moves all extremities normally.  Psych: Patient is not psychotic, no suicidal or hemocidal ideation.  Labs on Admission: I have personally reviewed following labs and imaging studies  CBC: Recent Labs  Lab 04/01/21 0857  WBC 7.4  HGB 10.3*  HCT 31.1*  MCV 81.2  PLT [id number]   Basic Metabolic Panel: Recent Labs  Lab 04/01/21 0857  NA 138  K 2.7*  CL 102  CO2 25  GLUCOSE 129*  BUN 9  CREATININE 0.62  CALCIUM 9.1  MG 2.1   GFR: Estimated Creatinine Clearance: 44.9 mL/min (by C-G formula based on SCr of 0.62 mg/dL). Liver Function Tests: Recent Labs  Lab 04/01/21 0857  AST 18  ALT 8  ALKPHOS 70  BILITOT 0.8  PROT 7.3  ALBUMIN 3.5   Recent Labs  Lab 04/01/21 0857  LIPASE 26   No results for input(s): AMMONIA in the last 168 hours. Coagulation Profile: No results for input(s): INR, PROTIME in the last 168 hours. Cardiac Enzymes: No results for input(s): CKTOTAL, CKMB, CKMBINDEX, TROPONINI in the last 168 hours. BNP (last 3 results) No results for input(s): PROBNP in the last 8760 hours. HbA1C: No results for input(s): HGBA1C in the last 72 hours. CBG: No results for input(s): GLUCAP in the last 168 hours. Lipid Profile: No  results for input(s): CHOL, HDL, LDLCALC, TRIG, CHOLHDL, LDLDIRECT in the last 72 hours. Thyroid Function Tests: No results for input(s): TSH, T4TOTAL, FREET4, T3FREE, THYROIDAB in the last 72 hours. Anemia Panel: No results for input(s): VITAMINB12, FOLATE, FERRITIN, TIBC, IRON, RETICCTPCT in the last 72 hours. Urine analysis:    Component Value Date/Time   COLORURINE YELLOW (A) 11/28/2017 1958   APPEARANCEUR Cloudy (A) 01/02/2019 1538   LABSPEC 1.011 11/28/2017 1958   LABSPEC 1.010 05/08/2013 1326   PHURINE 7.0 11/28/2017 1958   GLUCOSEU Negative 01/02/2019 1538   GLUCOSEU NEGATIVE 05/08/2013 1326   HGBUR SMALL (A) 11/28/2017 1958   BILIRUBINUR Negative 01/02/2019 1538   BILIRUBINUR NEGATIVE 05/08/2013 1326   [place] 11/28/2017 1958   PROTEINUR Trace (A) 01/02/2019 1538   PROTEINUR NEGATIVE 11/28/2017 1958   NITRITE Negative 01/02/2019 1538   NITRITE NEGATIVE 11/28/2017 1958   LEUKOCYTESUR 1+ (A) 01/02/2019 1538   LEUKOCYTESUR 3+ 05/08/2013 1326   Sepsis Labs: '@LABRCNTIP'$ (procalcitonin:4,lacticidven:4) )No results found for this or any previous visit (from the past 240 hour(s)).   Radiological Exams on Admission: CT ABDOMEN PELVIS W CONTRAST  Result Date: 04/01/2021 CLINICAL DATA:  87 year old female with abdominal pain and distension. History of large bowel diverticulosis. EXAM: CT ABDOMEN AND PELVIS WITH CONTRAST TECHNIQUE: Multidetector CT imaging of the abdomen and pelvis was performed using the standard protocol following bolus administration of intravenous contrast. CONTRAST:  73m OMNIPAQUE IOHEXOL 350 MG/ML SOLN COMPARISON:  CT Abdomen and Pelvis 03/11/2009. Abdomen ultrasound 04/29/2017. FINDINGS: Lower chest: Chronic cardiomegaly appears progressed since 2010. No pericardial effusion. Tortuous descending thoracic aorta with calcified atherosclerosis. Moderate to large chronic hiatal hernia has progressed since 2010 and is eccentric to the right as before. Near  complete intrathoracic stomach which is mostly decompressed today. Associated atelectasis but otherwise negative lung bases. No pleural effusion. Hepatobiliary: Chronically absent gallbladder. No liver lesion identified. Pancreas: Atrophied. Spleen: Negative. Adrenals/Urinary Tract: Normal adrenal glands. Nonobstructed kidneys. Chronic exophytic left renal cysts are benign with simple fluid density and have not significantly changed since 2010. There is symmetric renal contrast excretion  on the delayed images. Ureters are within normal limits. Negative bladder. Stomach/Bowel: Decompressed rectum. Extensive diverticulosis of the sigmoid colon which is redundant in the pelvis. Indistinct appearance of the sigmoid, but no convincing focal active inflammation. Diverticulosis continues through the descending colon which is decompressed. No descending inflammation. However, the distal transverse colon and splenic flexure are abnormal. There is an abrupt caliber change in the distal transverse colon best seen on series 6, image 19 where an apple core tumor of the bowel can not be excluded. And delayed images which also demonstrate an unchanged appearance of the tapered transverse colon on series 4, image 43. But there is continued somewhat circumferential bowel wall thickening (series 2, image 29) up to nearly 10 mm toward the splenic flexure. The splenic flexure is decompressed and indistinct. Upstream of the distal transverse colon there is increased retained stool and distension of the large bowel to the cecum. And there is mild mesenteric stranding in those segments. Mild right colon diverticulosis. Diminutive or absent appendix. Terminal ileum is within normal limits. No dilated small bowel. Largely intrathoracic stomach. Duodenum is negative. No free air. No free fluid identified in the abdomen. Vascular/Lymphatic: Aortoiliac calcified atherosclerosis. Major arterial structures remain patent. Patent portal venous  system. No lymphadenopathy. Reproductive: Absent uterus. Ovaries are diminutive and normal. Numerous pelvic phleboliths. Other: There is trace free fluid in the pelvis with simple fluid density (coronal image 60). Musculoskeletal: Dextroconvex lumbar scoliosis. Advanced lower thoracic and upper lumbar disc and endplate degeneration. Widespread advanced lumbar facet degeneration. Left lumbosacral junction assimilation joint. Hip joint degeneration. No acute osseous abnormality identified. IMPRESSION: 1. Abnormal large bowel. There is an abrupt caliber change in the distal transverse colon (series 2, image 36) with upstream colon distended with stool and appearing mildly inflamed. An obstructing Apple Core Tumor is not be excluded. Follow-up Endoscopy, or failing that Virtual Colonoscopy, is recommended. But from that lesion to the splenic flexure there is also somewhat generalized bowel wall thickening which would be more typical of colitis. Underlying large bowel diverticula, severe in the sigmoid colon, but no active diverticulitis. 2. No free air. Trace free fluid in the pelvis is felt secondary to #1. 3. Progressed Cardiomegaly and hiatal hernia since 2010, now with largely intrathoracic stomach. 4. Aortic Atherosclerosis (ICD10-I70.0). Electronically Signed   By: Genevie Ann M.D.   On: 04/01/2021 10:48     EKG: I have personally reviewed.  Sinus rhythm, QTC 403, low voltage, nonspecific T wave.   Assessment/Plan Principal Problem:   Abdominal pain Active Problems:   Hypertension   Hypothyroidism   Stroke ([place])   Hypokalemia   Depression with anxiety   Atrial fibrillation, chronic (HCC)   Normocytic anemia   Abdominal pain: CT scan findings are concerning for thickening of the distal transverse colon with proximal dilation. Cannot r/o obstructing malignancy. Dr. Alice Reichert of GI is consulted. He recommend Laxative regimen and not recommend colonoscopy. Dr. Christian Mate and PA Olean Ree of surgery are also  consulted --> likely do surgery on 9/9.   -will admit to med-surg bed as inpt -clear diet now, and NPO at midnight on Thursday (not ordered yet) -prn Zofran for nausea and fentanyl for pain -IV fluid: 75 cc/h for normal CD -Check INR and PTT -Hold Coumadin  Hypertension -IV hydralazine as needed -Amlodipine, Cardizem,  Hypothyroidism -Synthroid  Stroke [facility]): Patient is on Coumadin for A. fib -Hold Coumadin  Hypokalemia: Potassium 2.7. -Replete potassium with 10 mEq  IV x 4 and 40 milliequivalents of Kdur -  give 1 g of magnesium sulfate -Check magnesium level --> 2.1  Depression with anxiety -Continue home medications  Atrial fibrillation, chronic (HCC) -Hold Coumadin -Cardizem  Normocytic anemia: Hemoglobin stable, 10.3 today, 0.8 on 12/09/2019 -Follow-up with CBC         DVT ppx: SCD Code Status: DNR (I discussed with patient in the presence of her daughter, and explained the meaning of CODE STATUS. Patient wants to be DNR)  Family Communication:  Yes, patient's daughger at bed side Disposition Plan:  Anticipate discharge back to previous environment Consults called:  Dr. Alice Reichert of GI and Dr. Christian Mate and PA Olean Ree of surger Admission status and Level of care: Med-Surg:    Med-surg bed for obs as inpt     Status is: Inpatient  Remains inpatient appropriate because:Inpatient level of care appropriate due to severity of illness  Dispo: The patient is from: Home              Anticipated d/c is to:  to be determined              Patient currently is not medically stable to d/c.   Difficult to place patient No          Date of Service 04/01/2021    Ivor Costa [facility]   If 7PM-7AM, please contact night-coverage [email or web address] 04/01/2021, 1:31 PM

## 2021-04-01 NOTE — ED Provider Notes (Signed)
[facility] Emergency Department Provider Note  ____________________________________________  Time seen: Approximately 12:07 PM  I have reviewed the triage vital signs and the nursing notes.   HISTORY  Chief Complaint Abdominal Pain    HPI Gabriela [name] is a 87 y.o. female with a past history of GERD, hypertension, stroke, hypothyroidism, breast cancer who comes ED complaining of generalized abdominal pain and constipation, gradual onset and worsening for the past 4 days, constant.  No relief with stool softeners.  She is passing gas but unable to have a bowel movement.  No vomiting.  She has decreased appetite but still able to take some amount by mouth.  No fever.  No chest pain or shortness of breath.  No aggravating or alleviating factors.    Past Medical History:  Diagnosis Date   Acid reflux    Anxiety    Arrhythmia    Bladder cancer ([place])    Breast cancer ([place])    Cancer ([place])    breast,bladder   Depression    Dysrhythmia    afib   GERD (gastroesophageal reflux disease)    History of hiatal hernia    HOH (hard of hearing)    aids   Hypertension    Hyperthyroidism    Hypothyroidism    Stroke ([place])    2010   TIA (transient ischemic attack)      Patient Active Problem List   Diagnosis Date Noted   Abdominal pain 04/01/2021   Hypertension    Hypothyroidism    Stroke ([place])    Hypokalemia    Depression with anxiety    Atrial fibrillation, chronic (HCC)    Normocytic anemia    Sacroiliitis ([place]) 07/12/2016   Pain in right hip 07/12/2016     Past Surgical History:  Procedure Laterality Date   ABDOMINAL HYSTERECTOMY     ANKLE ARTHODESIS W/ ARTHROSCOPY     BLADDER SURGERY     CATARACT EXTRACTION W/PHACO Left 12/02/2014   Procedure: CATARACT EXTRACTION PHACO AND INTRAOCULAR LENS PLACEMENT ([place]);  Surgeon: Estill Cotta, MD;  Location: [facility] ORS;  Service: Ophthalmology;  Laterality: Left;  US01:45 AP%25.7 CDE42.99    CHOLECYSTECTOMY     EYE SURGERY     cataract   JOINT REPLACEMENT     bil tkr   MASTECTOMY     partial right   REPLACEMENT TOTAL KNEE       Prior to Admission medications   Medication Sig Start Date End Date Taking? Authorizing Provider  ALPRAZolam (XANAX) 0.25 MG tablet Take 0.125 mg by mouth at bedtime as needed for anxiety.    [provider]  diltiazem (TIAZAC) 240 MG 24 hr capsule Take 240 mg by mouth daily. am    [provider]  levothyroxine (SYNTHROID, LEVOTHROID) 112 MCG tablet Take 112 mcg by mouth daily before breakfast.    [provider]  lidocaine (LIDODERM) 5 % Place 1 patch onto the skin daily. Remove & Discard patch within 12 hours or as directed by MD 08/25/20 08/25/21  Sable Feil, PA-C  meclizine (ANTIVERT) 25 MG tablet Take 1 tablet (25 mg total) by mouth 3 (three) times daily as needed for dizziness or nausea. 04/07/17   Paulette Blanch, MD  ondansetron (ZOFRAN ODT) 4 MG disintegrating tablet Take 1 tablet (4 mg total) by mouth every 8 (eight) hours as needed for nausea or vomiting. 04/07/17   Paulette Blanch, MD  sertraline (ZOLOFT) 50 MG tablet Take 50 mg by mouth daily.  hs    [provider]  warfarin (COUMADIN) 3 MG tablet Take 3 mg by mouth daily. Hs 5 days a week    [provider]  warfarin (COUMADIN) 4 MG tablet Take 4 mg by mouth daily. Hs 2 days a week    [provider]     Allergies Celebrex [celecoxib], Morphine and related, Povidone-iodine, and Sulfa antibiotics   No family history on file.  Social History Social History   Tobacco Use   Smoking status: Never   Smokeless tobacco: Never  Substance Use Topics   Alcohol use: No   Drug use: Never    Review of Systems  Constitutional:   No fever or chills.  ENT:   No sore throat. No rhinorrhea. Cardiovascular:   No chest pain or syncope. Respiratory:   No dyspnea or cough. Gastrointestinal:   Positive as above for abdominal pain and  constipation Musculoskeletal:   Negative for focal pain or swelling All other systems reviewed and are negative except as documented above in ROS and HPI.  ____________________________________________   PHYSICAL EXAM:  VITAL SIGNS: ED Triage Vitals  Enc Vitals Group     BP 04/01/21 0835 (!) 158/86     Pulse Rate 04/01/21 0835 91     Resp 04/01/21 0835 17     Temp 04/01/21 0836 98.6 F (37 C)     Temp Source 04/01/21 0835 Oral     SpO2 04/01/21 0835 97 %     Weight 04/01/21 0836 143 lb (64.9 kg)     Height 04/01/21 0836 '5\' 3"'$  (1.6 m)     Head Circumference --      Peak Flow --      Pain Score 04/01/21 0836 4     Pain Loc --      Pain Edu? --      Excl. in [place]? --     Vital signs reviewed, nursing assessments reviewed.   Constitutional:   Alert and oriented. Non-toxic appearance. Eyes:   Conjunctivae are normal. EOMI. PERRL. ENT      Head:   Normocephalic and atraumatic.      Nose:   Wearing a mask.      Mouth/Throat:   Wearing a mask.      Neck:   No meningismus. Full ROM. Hematological/Lymphatic/Immunilogical:   No cervical lymphadenopathy. Cardiovascular:   Irregularly irregular rhythm. Symmetric bilateral radial and DP pulses.  No murmurs. Cap refill less than 2 seconds. Respiratory:   Normal respiratory effort without tachypnea/retractions. Breath sounds are clear and equal bilaterally. No wheezes/rales/rhonchi. Gastrointestinal:   Soft with diffuse left-sided abdominal tenderness. Non distended. There is no CVA tenderness.  No rebound, rigidity, or guarding. Genitourinary:   deferred Musculoskeletal:   Normal range of motion in all extremities. No joint effusions.  No lower extremity tenderness.  No edema. Neurologic:   Normal speech and language.  Motor grossly intact. No acute focal neurologic deficits are appreciated.  Skin:    Skin is warm, dry and intact. No rash noted.  No petechiae, purpura, or bullae.  ____________________________________________     LABS (pertinent positives/negatives) (all labs ordered are listed, but only abnormal results are displayed) Labs Reviewed  COMPREHENSIVE METABOLIC PANEL - Abnormal; Notable for the following components:      Result Value   Potassium 2.7 (*)    Glucose, Bld 129 (*)    All other components within normal limits  CBC - Abnormal; Notable for the following components:   RBC 3.83 (*)  Hemoglobin 10.3 (*)    HCT 31.1 (*)    All other components within normal limits  RESP PANEL BY RT-PCR (FLU A&B, COVID) ARPGX2  LIPASE, BLOOD  URINALYSIS, COMPLETE (UACMP) WITH MICROSCOPIC  PROTIME-INR  APTT  MAGNESIUM   ____________________________________________   EKG  Interpreted by me Atrial fibrillation, rate of 74, normal axis and intervals.  Normal QRS ST segments and T waves.  ____________________________________________    RADIOLOGY  CT ABDOMEN PELVIS W CONTRAST  Result Date: 04/01/2021 CLINICAL DATA:  87 year old female with abdominal pain and distension. History of large bowel diverticulosis. EXAM: CT ABDOMEN AND PELVIS WITH CONTRAST TECHNIQUE: Multidetector CT imaging of the abdomen and pelvis was performed using the standard protocol following bolus administration of intravenous contrast. CONTRAST:  59m OMNIPAQUE IOHEXOL 350 MG/ML SOLN COMPARISON:  CT Abdomen and Pelvis 03/11/2009. Abdomen ultrasound 04/29/2017. FINDINGS: Lower chest: Chronic cardiomegaly appears progressed since 2010. No pericardial effusion. Tortuous descending thoracic aorta with calcified atherosclerosis. Moderate to large chronic hiatal hernia has progressed since 2010 and is eccentric to the right as before. Near complete intrathoracic stomach which is mostly decompressed today. Associated atelectasis but otherwise negative lung bases. No pleural effusion. Hepatobiliary: Chronically absent gallbladder. No liver lesion identified. Pancreas: Atrophied. Spleen: Negative. Adrenals/Urinary Tract: Normal adrenal glands.  Nonobstructed kidneys. Chronic exophytic left renal cysts are benign with simple fluid density and have not significantly changed since 2010. There is symmetric renal contrast excretion on the delayed images. Ureters are within normal limits. Negative bladder. Stomach/Bowel: Decompressed rectum. Extensive diverticulosis of the sigmoid colon which is redundant in the pelvis. Indistinct appearance of the sigmoid, but no convincing focal active inflammation. Diverticulosis continues through the descending colon which is decompressed. No descending inflammation. However, the distal transverse colon and splenic flexure are abnormal. There is an abrupt caliber change in the distal transverse colon best seen on series 6, image 19 where an apple core tumor of the bowel can not be excluded. And delayed images which also demonstrate an unchanged appearance of the tapered transverse colon on series 4, image 43. But there is continued somewhat circumferential bowel wall thickening (series 2, image 29) up to nearly 10 mm toward the splenic flexure. The splenic flexure is decompressed and indistinct. Upstream of the distal transverse colon there is increased retained stool and distension of the large bowel to the cecum. And there is mild mesenteric stranding in those segments. Mild right colon diverticulosis. Diminutive or absent appendix. Terminal ileum is within normal limits. No dilated small bowel. Largely intrathoracic stomach. Duodenum is negative. No free air. No free fluid identified in the abdomen. Vascular/Lymphatic: Aortoiliac calcified atherosclerosis. Major arterial structures remain patent. Patent portal venous system. No lymphadenopathy. Reproductive: Absent uterus. Ovaries are diminutive and normal. Numerous pelvic phleboliths. Other: There is trace free fluid in the pelvis with simple fluid density (coronal image 60). Musculoskeletal: Dextroconvex lumbar scoliosis. Advanced lower thoracic and upper lumbar disc  and endplate degeneration. Widespread advanced lumbar facet degeneration. Left lumbosacral junction assimilation joint. Hip joint degeneration. No acute osseous abnormality identified. IMPRESSION: 1. Abnormal large bowel. There is an abrupt caliber change in the distal transverse colon (series 2, image 36) with upstream colon distended with stool and appearing mildly inflamed. An obstructing Apple Core Tumor is not be excluded. Follow-up Endoscopy, or failing that Virtual Colonoscopy, is recommended. But from that lesion to the splenic flexure there is also somewhat generalized bowel wall thickening which would be more typical of colitis. Underlying large bowel diverticula, severe in the sigmoid  colon, but no active diverticulitis. 2. No free air. Trace free fluid in the pelvis is felt secondary to #1. 3. Progressed Cardiomegaly and hiatal hernia since 2010, now with largely intrathoracic stomach. 4. Aortic Atherosclerosis (ICD10-I70.0). Electronically Signed   By: Genevie Ann M.D.   On: 04/01/2021 10:48    ____________________________________________   PROCEDURES Procedures  ____________________________________________  DIFFERENTIAL DIAGNOSIS   Diverticulitis, bowel obstruction, bowel perforation, pyelonephritis, tumor  CLINICAL IMPRESSION / ASSESSMENT AND PLAN / ED COURSE  Medications ordered in the ED: Medications  potassium chloride 10 mEq in 100 mL IVPB (10 mEq Intravenous New Bag/Given 04/01/21 1126)  0.9 %  sodium chloride infusion (has no administration in time range)  ondansetron (ZOFRAN) injection 4 mg (has no administration in time range)  hydrALAZINE (APRESOLINE) injection 5 mg (has no administration in time range)  acetaminophen (TYLENOL) tablet 650 mg (has no administration in time range)  fentaNYL (SUBLIMAZE) injection 12.5 mcg (has no administration in time range)  potassium chloride SA (KLOR-CON) CR tablet 40 mEq (has no administration in time range)  magnesium sulfate IVPB 1 g  100 mL (has no administration in time range)  iohexol (OMNIPAQUE) 350 MG/ML injection 80 mL (80 mLs Intravenous Contrast Given 04/01/21 1005)    Pertinent labs & imaging results that were available during my care of the patient were reviewed by me and considered in my medical decision making (see chart for details).  SAI AUSMUS was evaluated in Emergency Department on 04/01/2021 for the symptoms described in the history of present illness. She was evaluated in the context of the global COVID-19 pandemic, which necessitated consideration that the patient might be at risk for infection with the SARS-CoV-2 virus that causes COVID-19. Institutional protocols and algorithms that pertain to the evaluation of patients at risk for COVID-19 are in a state of rapid change based on information released by regulatory bodies including the CDC and federal and state organizations. These policies and algorithms were followed during the patient's care in the ED.   Patient presents with abdominal pain and constipation.  Vital signs unremarkable.  Overall she is nontoxic but with age and abdominal tenderness, CT obtained which does show evidence of large intestine obstruction in the transverse colon, possible apple core lesion.  Plan to admit for further evaluation, continued hydration.  IV potassium ordered for her hypokalemia with 2.7.      ____________________________________________   FINAL CLINICAL IMPRESSION(S) / ED DIAGNOSES    Final diagnoses:  Generalized abdominal pain  Colon obstruction [facility])     ED Discharge Orders     None       Portions of this note were generated with dragon dictation software. Dictation errors may occur despite best attempts at proofreading.    Carrie Mew, MD 04/01/21 1210

## 2021-04-01 NOTE — Consult Note (Addendum)
[facility] SURGICAL CONSULTATION NOTE (initial) - [id number]   HISTORY OF PRESENT ILLNESS (HPI):  87 y.o. female presented to [facility] ED today for evaluation of abdominal pain. Patient reports a gradual onset of constipation and generalized abdominal pain over the course of the last 4 days at home. She is not able to localize the pain. Eating seems to minimally exacerbate this but she is able to tolerate PO. She does endorse around a 25 lbs weight loss in the last year. She denied any fever, chills, CP, SOB, nausea, emesis, urinary changes. No hematemesis or pencil thin stools. Previous abdominal surgeries are positive for abdominal hysterectomy and cholecystectomy. No history of Colon CA. Last colonoscopy was "many years ago." She is on coumadin and last took this last night., She is able to preform ADLs but does endorse some baseline SOB with these. Work up in the ED was relatively reassuring aside form hypokalemia to 2.7. However, she did undergo a CT Abdomen/Pelvis which was concerning for thickening of the distal transverse colon with proximal dilation concerning for partially/near complete obstructing malignancy. She was admitted to the medicine service with GI and surgery consultations.   Surgery is consulted by hospitalist physician Dr. Ivor Costa, MD in this context for evaluation and management of thickening of the transverse colon concerning for malignancy.   PAST MEDICAL HISTORY (PMH):  Past Medical History:  Diagnosis Date   Acid reflux    Anxiety    Arrhythmia    Bladder cancer ([place])    Breast cancer ([place])    Cancer ([place])    breast,bladder   Depression    Dysrhythmia    afib   GERD (gastroesophageal reflux disease)    History of hiatal hernia    HOH (hard of hearing)    aids   Hypertension    Hyperthyroidism    Hypothyroidism    Stroke ([place])    2010   TIA (transient ischemic attack)      PAST SURGICAL HISTORY ([place]):  Past Surgical History:  Procedure  Laterality Date   ABDOMINAL HYSTERECTOMY     ANKLE ARTHODESIS W/ ARTHROSCOPY     BLADDER SURGERY     CATARACT EXTRACTION W/PHACO Left 12/02/2014   Procedure: CATARACT EXTRACTION PHACO AND INTRAOCULAR LENS PLACEMENT ([place]);  Surgeon: Estill Cotta, MD;  Location: [facility] ORS;  Service: Ophthalmology;  Laterality: Left;  US01:45 AP%25.7 CDE42.99   CHOLECYSTECTOMY     EYE SURGERY     cataract   JOINT REPLACEMENT     bil tkr   MASTECTOMY     partial right   REPLACEMENT TOTAL KNEE       MEDICATIONS:  Prior to Admission medications   Medication Sig Start Date End Date Taking? Authorizing Provider  ALPRAZolam (XANAX) 0.25 MG tablet Take 0.125 mg by mouth at bedtime as needed for anxiety.    [provider]  diltiazem (TIAZAC) 240 MG 24 hr capsule Take 240 mg by mouth daily. am    [provider]  levothyroxine (SYNTHROID, LEVOTHROID) 112 MCG tablet Take 112 mcg by mouth daily before breakfast.    [provider]  lidocaine (LIDODERM) 5 % Place 1 patch onto the skin daily. Remove & Discard patch within 12 hours or as directed by MD 08/25/20 08/25/21  Sable Feil, PA-C  meclizine (ANTIVERT) 25 MG tablet Take 1 tablet (25 mg total) by mouth 3 (three) times daily as needed for dizziness or nausea. 04/07/17   Paulette Blanch, MD  ondansetron [facility]  ODT) 4 MG disintegrating tablet Take 1 tablet (4 mg total) by mouth every 8 (eight) hours as needed for nausea or vomiting. 04/07/17   Paulette Blanch, MD  sertraline (ZOLOFT) 50 MG tablet Take 50 mg by mouth daily. hs    [provider]  warfarin (COUMADIN) 3 MG tablet Take 3 mg by mouth daily. Hs 5 days a week    [provider]  warfarin (COUMADIN) 4 MG tablet Take 4 mg by mouth daily. Hs 2 days a week    [provider]     ALLERGIES:  Allergies  Allergen Reactions   Celebrex [Celecoxib] Anaphylaxis   Morphine And Related Swelling    Lips and mouth   Povidone-Iodine Other (See Comments)    Other  reaction(s): ITCHING Other reaction(s): ITCHING    Sulfa Antibiotics      SOCIAL HISTORY:  Social History   Socioeconomic History   Marital status: Widowed    Spouse name: Not on file   Number of children: Not on file   Years of education: Not on file   Highest education level: Not on file  Occupational History   Not on file  Tobacco Use   Smoking status: Never   Smokeless tobacco: Never  Substance and Sexual Activity   Alcohol use: No   Drug use: Never   Sexual activity: Not on file  Other Topics Concern   Not on file  Social History Narrative   Not on file   Social Determinants of Health   Financial Resource Strain: Not on file  Food Insecurity: Not on file  Transportation Needs: Not on file  Physical Activity: Not on file  Stress: Not on file  Social Connections: Not on file  Intimate Partner Violence: Not on file     FAMILY HISTORY:  No family history on file.    REVIEW OF SYSTEMS:  Review of Systems  Constitutional:  Positive for weight loss. Negative for chills and fever.  HENT:  Negative for congestion and sore throat.   Respiratory:  Negative for cough and shortness of breath.   Cardiovascular:  Negative for chest pain and palpitations.  Gastrointestinal:  Positive for abdominal pain and constipation. Negative for blood in stool, diarrhea, melena, nausea and vomiting.  Genitourinary:  Negative for dysuria and urgency.  All other systems reviewed and are negative.  VITAL SIGNS:  Temp:  [98.6 F (37 C)] 98.6 F (37 C) (09/07 0836) Pulse Rate:  [72-91] 72 (09/07 1130) Resp:  [16-18] 17 (09/07 1130) BP: (139-158)/(68-91) 154/91 (09/07 1130) SpO2:  [97 %-100 %] 100 % (09/07 1130) Weight:  [64.9 kg] 64.9 kg (09/07 0836)     Height: '5\' 3"'$  (160 cm) Weight: 64.9 kg BMI (Calculated): 25.34   INTAKE/OUTPUT:  No intake/output data recorded.  PHYSICAL EXAM:  Physical Exam Vitals and nursing note reviewed. Exam conducted with a chaperone present.   Constitutional:      General: She is not in acute distress.    Appearance: She is well-developed. She is obese. She is not ill-appearing.     Comments: Patient resting comfortably, family at bedside  HENT:     Head: Normocephalic and atraumatic.     Right Ear: Decreased hearing noted.     Left Ear: Decreased hearing noted.     Ears:     Comments: Hard of hearing, wearing hearing aids  Eyes:     General: No scleral icterus.    Extraocular Movements: Extraocular movements intact.  Pupils: Pupils are equal, round, and reactive to light.  Cardiovascular:     Rate and Rhythm: Normal rate and regular rhythm.     Heart sounds: Normal heart sounds. No murmur heard. Pulmonary:     Effort: Pulmonary effort is normal. No respiratory distress.     Breath sounds: Normal breath sounds.  Abdominal:     General: Abdomen is protuberant. A surgical scar is present. There is distension.     Palpations: Abdomen is soft.     Tenderness: There is no abdominal tenderness. There is no guarding or rebound.     Comments: Abdomen is soft, no point tenderness on examination, she is distended and tympanic, no rebound/guarding  Genitourinary:    Comments: Deferred Skin:    General: Skin is warm and dry.     Coloration: Skin is not jaundiced or pale.  Neurological:     General: No focal deficit present.     Mental Status: She is alert and oriented to person, place, and time.  Psychiatric:        Mood and Affect: Mood normal.        Behavior: Behavior normal.     Labs:  CBC Latest Ref Rng & Units 04/01/2021 07/10/2020 11/28/2017  WBC 4.0 - 10.5 K/uL 7.4 4.8 7.2  Hemoglobin 12.0 - 15.0 g/dL 10.3(L) 8.0(L) 9.7(L)  Hematocrit 36.0 - 46.0 % 31.1(L) 28.3(L) 30.2(L)  Platelets 150 - 400 K/uL 334 344 359   CMP Latest Ref Rng & Units 04/01/2021 07/10/2020 11/28/2017  Glucose 70 - 99 mg/dL 129(H) 124(H) 127(H)  BUN 8 - 23 mg/dL '9 14 19  '$ Creatinine 0.44 - 1.00 mg/dL 0.62 0.68 0.61  Sodium 135 - 145 mmol/L 138  141 138  Potassium 3.5 - 5.1 mmol/L 2.7(LL) 3.7 3.6  Chloride 98 - 111 mmol/L 102 106 105  CO2 22 - 32 mmol/L '25 25 25  '$ Calcium 8.9 - 10.3 mg/dL 9.1 8.9 9.0  Total Protein 6.5 - 8.1 g/dL 7.3 7.5 7.4  Total Bilirubin 0.3 - 1.2 mg/dL 0.8 0.6 0.3  Alkaline Phos 38 - 126 U/L 70 56 62  AST 15 - 41 U/L '18 23 21  '$ ALT 0 - 44 U/L 8 10 10(L)     Imaging studies:   CT Abdomen/Pelvis (04/01/2021) personally reviewed showing thickening of the mid to distal transverse colon with some degree of proximal back up of stool, there is severe diverticulosis of the distal colon without diverticulitis, diaphragmatic hernia, and radiologist report reviewed below:   IMPRESSION: 1. Abnormal large bowel. There is an abrupt caliber change in the distal transverse colon (series 2, image 36) with upstream colon distended with stool and appearing mildly inflamed. An obstructing Apple Core Tumor is not be excluded. Follow-up Endoscopy, or failing that Virtual Colonoscopy, is recommended. But from that lesion to the splenic flexure there is also somewhat generalized bowel wall thickening which would be more typical of colitis. Underlying large bowel diverticula, severe in the sigmoid colon, but no active diverticulitis.   2. No free air. Trace free fluid in the pelvis is felt secondary to #1.   3. Progressed Cardiomegaly and hiatal hernia since 2010, now with largely intrathoracic stomach.   4. Aortic Atherosclerosis (ICD10-I70.0).   Assessment/Plan: (ICD-10's: K77.89) 87 y.o. female with progressive abdominal distension and constipation found to have thickening of the distal transverse colon with proximal dilation concerning for partially/near complete obstructing malignancy.   - Appreciate medicine admission  - Appreciate GI consultation; suspect colonoscopy may not  be able to be completed given concern for obstruction  - I had a long discussion with the patient and her family at bedside regarding my  concerns that her presentation and imaging findings most likely represent a partial vs near obstructing malignancy. I discussed my recommendations for surgery in this setting to resect this given concern that this is potential mass is becoming more and more obstructing. She does not appear completely obstructed at the moment, so we do not need to proceed emergently with this, and can likely do it on Friday (09/09) given that she had her dose of warfarin yesterday. They are understanding that this will likely be an exploratory laparotomy and may also result in a colostomy. I reviewed risks of surgery with them. We also discussed alternatives to surgery, including palliative/hospice care, and what that may look like. They seemed understanding of the situation and all options. They would like some time to think about this, but they are leaning towards surgery it seems. As such, I will tentatively post her for Friday 09/09.   - Okay to trial CLD only; do not advance; NPO at midnight on Thursday  - I do not think we have much to gain from bowel prep  - Monitor abdominal examination  - Pain control prn; antiemetics prn  - Replete K+ - Mobilize as tolerated  - DVT prophylaxis; hold for potential surgery  - Further management per primary service; we will follow   All of the above findings and recommendations were discussed with the patient and her family, and al of their questions were answered to their expressed satisfaction.  Thank you for the opportunity to participate in this patient's care.   -- Edison Simon, PA-C [facility] 04/01/2021, 12:46 PM [phone] M-F: 7am - 4pm

## 2021-04-01 NOTE — ED Notes (Signed)
See triage note, pt to ED with daughter, reports generalized lower abd pain, denies n/v/d.

## 2021-04-01 NOTE — Consult Note (Signed)
[facility] GI Inpatient Consult Note   [name], M.D.  Reason for Consult: Abdominal pain. partial colonic obstruction with "apple core lesion" in area of the distal transverse colon on CT scan.   Attending Requesting Consult: Ivor Costa, M.D.  Outpatient Primary Physician: Thereasa Distance, M.D.  History of Present Illness: Gabriela [name] is a 87 y.o. female who was admitted from the emergency room for 1 week of progressive abdominal distention with pain and constipation.  Patient's last bowel movement was 3 days ago when was described as "normal" although did occur after taking some doses of a stool softener over-the-counter. Patient reports continued active flatus, today despite no defecation in3 days.  Patient denies any previous problems with constipation.  She admits to a 25 pound involuntary weight loss over the last year as well as symptoms of early satiety and decreased appetite.  Patient denies any nausea, vomiting, hematemesis, lower GI symptoms of bleeding.  Patient's last colonoscopy was reportedly 15 or 20 years ago performed by Dr. Gaylyn Cheers here in [place], which the patient and her daughter [name]) reported as "normal".  The report is not available for my review at this time. The patient takes Coumadin for chronic atrial fibrillation.  She has a history of GERD. She has a large hiatal hernia with majority of stomach in the chest cavity per CT obtained today.  Patient is listed as DO NOT RESUSCITATE status.  Despite being hard of hearing, the patient appears to be appropriate and affect and behavior.  Her daughter, [name], is also at the bedside to give additional information as feasible. Patient's daughter does remark the patient over the past 8 months has been diagnosed with and treated for iron deficiency anemia as well as B12 deficiency.  She was given some IV iron infusions and started on oral iron preparation but the patient could not tolerate GI side effects  and so quit oral iron several months ago.  Past Medical History:  Past Medical History:  Diagnosis Date   Acid reflux    Anxiety    Arrhythmia    Bladder cancer ([place])    Breast cancer ([place])    Cancer ([place])    breast,bladder   Depression    Dysrhythmia    afib   GERD (gastroesophageal reflux disease)    History of hiatal hernia    HOH (hard of hearing)    aids   Hypertension    Hyperthyroidism    Hypothyroidism    Stroke ([place])    2010   TIA (transient ischemic attack)     Problem List: Patient Active Problem List   Diagnosis Date Noted   Abdominal pain 04/01/2021   Hypertension    Hypothyroidism    Stroke ([place])    Hypokalemia    Depression with anxiety    Atrial fibrillation, chronic (HCC)    Normocytic anemia    Sacroiliitis ([place]) 07/12/2016   Pain in right hip 07/12/2016    Past Surgical History: Past Surgical History:  Procedure Laterality Date   ABDOMINAL HYSTERECTOMY     ANKLE ARTHODESIS W/ ARTHROSCOPY     BLADDER SURGERY     CATARACT EXTRACTION W/PHACO Left 12/02/2014   Procedure: CATARACT EXTRACTION PHACO AND INTRAOCULAR LENS PLACEMENT ([place]);  Surgeon: Estill Cotta, MD;  Location: [facility] ORS;  Service: Ophthalmology;  Laterality: Left;  US01:45 AP%25.7 CDE42.99   CHOLECYSTECTOMY     EYE SURGERY     cataract   JOINT REPLACEMENT     bil tkr  MASTECTOMY     partial right   REPLACEMENT TOTAL KNEE      Allergies: Allergies  Allergen Reactions   Celebrex [Celecoxib] Anaphylaxis   Morphine And Related Swelling    Lips and mouth   Povidone-Iodine Other (See Comments)    Other reaction(s): ITCHING Other reaction(s): ITCHING    Sulfa Antibiotics     Home Medications: (Not in a hospital admission)  Home medication reconciliation was completed with the patient.   Scheduled Inpatient Medications:    Continuous Inpatient Infusions:    sodium chloride 75 mL/hr at 04/01/21 1210   magnesium sulfate bolus IVPB 1 g (04/01/21 1239)   potassium  chloride 10 mEq (04/01/21 1239)    PRN Inpatient Medications:  acetaminophen, fentaNYL (SUBLIMAZE) injection, hydrALAZINE, ondansetron (ZOFRAN) IV  Family History: family history is not on file.   GI Family History: Negative.  Social History:   reports that she has never smoked. She has never used smokeless tobacco. She reports that she does not drink alcohol and does not use drugs. The patient denies ETOH, tobacco, or drug use.    Review of Systems: Review of Systems - History obtained from daughter, chart review, and the patient General ROS: positive for  - weight loss negative for - chills, fever, or night sweats Psychological ROS: negative Ophthalmic ROS: negative ENT ROS: negative Allergy and Immunology ROS: positive for - REMINDER:DOCUMENT ALLERGIES IN ALLERGY ACTIVITY negative for - REMINDER:DOCUMENT ALLERGIES IN ALLERGY ACTIVITY Hematological and Lymphatic ROS: negative Endocrine ROS: negative Breast ROS: positive for - None negative for - new or changing breast lumps Respiratory ROS: no cough, shortness of breath, or wheezing Cardiovascular ROS: no chest pain or dyspnea on exertion Genito-Urinary ROS: no dysuria, trouble voiding, or hematuria Musculoskeletal ROS: positive for - joint stiffness negative for - gait disturbance Neurological ROS: no TIA or stroke symptoms Dermatological ROS: negative  Physical Examination: BP (!) 154/91   Pulse 72   Temp 98.6 F (37 C)   Resp 17   Ht '5\' 3"'$  (1.6 m)   Wt 64.9 kg   LMP  (LMP Unknown)   SpO2 100%   BMI 25.33 kg/m  Physical Exam Vitals and nursing note reviewed. Exam conducted with a chaperone present.  Constitutional:      General: She is not in acute distress.    Appearance: She is not toxic-appearing.  HENT:     Head: Normocephalic and atraumatic.  Eyes:     Extraocular Movements: Extraocular movements intact.  Cardiovascular:     Rate and Rhythm: Normal rate.  Pulmonary:     Effort: Pulmonary effort is  normal.     Breath sounds: Normal breath sounds.  Abdominal:     General: Bowel sounds are normal.     Tenderness: There is no abdominal tenderness. There is no guarding or rebound.     Hernia: No hernia is present.  Skin:    General: Skin is warm and dry.  Neurological:     Mental Status: She is alert and oriented to person, place, and time.     Comments: Hard of hearing  Psychiatric:        Mood and Affect: Mood normal.        Behavior: Behavior normal.    Data: Lab Results  Component Value Date   WBC 7.4 04/01/2021   HGB 10.3 (L) 04/01/2021   HCT 31.1 (L) 04/01/2021   MCV 81.2 04/01/2021   PLT 334 04/01/2021   Recent Labs  Lab 04/01/21 0857  HGB 10.3*   Lab Results  Component Value Date   NA 138 04/01/2021   K 2.7 (LL) 04/01/2021   CL 102 04/01/2021   CO2 25 04/01/2021   BUN 9 04/01/2021   CREATININE 0.62 04/01/2021   Lab Results  Component Value Date   ALT 8 04/01/2021   AST 18 04/01/2021   ALKPHOS 70 04/01/2021   BILITOT 0.8 04/01/2021   No results for input(s): APTT, INR, PTT in the last 168 hours. CBC Latest Ref Rng & Units 04/01/2021 07/10/2020 11/28/2017  WBC 4.0 - 10.5 K/uL 7.4 4.8 7.2  Hemoglobin 12.0 - 15.0 g/dL 10.3(L) 8.0(L) 9.7(L)  Hematocrit 36.0 - 46.0 % 31.1(L) 28.3(L) 30.2(L)  Platelets 150 - 400 K/uL 334 344 359    STUDIES: CT ABDOMEN PELVIS W CONTRAST  Result Date: 04/01/2021 CLINICAL DATA:  87 year old female with abdominal pain and distension. History of large bowel diverticulosis. EXAM: CT ABDOMEN AND PELVIS WITH CONTRAST TECHNIQUE: Multidetector CT imaging of the abdomen and pelvis was performed using the standard protocol following bolus administration of intravenous contrast. CONTRAST:  31m OMNIPAQUE IOHEXOL 350 MG/ML SOLN COMPARISON:  CT Abdomen and Pelvis 03/11/2009. Abdomen ultrasound 04/29/2017. FINDINGS: Lower chest: Chronic cardiomegaly appears progressed since 2010. No pericardial effusion. Tortuous descending thoracic aorta with  calcified atherosclerosis. Moderate to large chronic hiatal hernia has progressed since 2010 and is eccentric to the right as before. Near complete intrathoracic stomach which is mostly decompressed today. Associated atelectasis but otherwise negative lung bases. No pleural effusion. Hepatobiliary: Chronically absent gallbladder. No liver lesion identified. Pancreas: Atrophied. Spleen: Negative. Adrenals/Urinary Tract: Normal adrenal glands. Nonobstructed kidneys. Chronic exophytic left renal cysts are benign with simple fluid density and have not significantly changed since 2010. There is symmetric renal contrast excretion on the delayed images. Ureters are within normal limits. Negative bladder. Stomach/Bowel: Decompressed rectum. Extensive diverticulosis of the sigmoid colon which is redundant in the pelvis. Indistinct appearance of the sigmoid, but no convincing focal active inflammation. Diverticulosis continues through the descending colon which is decompressed. No descending inflammation. However, the distal transverse colon and splenic flexure are abnormal. There is an abrupt caliber change in the distal transverse colon best seen on series 6, image 19 where an apple core tumor of the bowel can not be excluded. And delayed images which also demonstrate an unchanged appearance of the tapered transverse colon on series 4, image 43. But there is continued somewhat circumferential bowel wall thickening (series 2, image 29) up to nearly 10 mm toward the splenic flexure. The splenic flexure is decompressed and indistinct. Upstream of the distal transverse colon there is increased retained stool and distension of the large bowel to the cecum. And there is mild mesenteric stranding in those segments. Mild right colon diverticulosis. Diminutive or absent appendix. Terminal ileum is within normal limits. No dilated small bowel. Largely intrathoracic stomach. Duodenum is negative. No free air. No free fluid identified  in the abdomen. Vascular/Lymphatic: Aortoiliac calcified atherosclerosis. Major arterial structures remain patent. Patent portal venous system. No lymphadenopathy. Reproductive: Absent uterus. Ovaries are diminutive and normal. Numerous pelvic phleboliths. Other: There is trace free fluid in the pelvis with simple fluid density (coronal image 60). Musculoskeletal: Dextroconvex lumbar scoliosis. Advanced lower thoracic and upper lumbar disc and endplate degeneration. Widespread advanced lumbar facet degeneration. Left lumbosacral junction assimilation joint. Hip joint degeneration. No acute osseous abnormality identified. IMPRESSION: 1. Abnormal large bowel. There is an abrupt caliber change in the distal transverse colon (series 2, image 36) with upstream colon  distended with stool and appearing mildly inflamed. An obstructing Apple Core Tumor is not be excluded. Follow-up Endoscopy, or failing that Virtual Colonoscopy, is recommended. But from that lesion to the splenic flexure there is also somewhat generalized bowel wall thickening which would be more typical of colitis. Underlying large bowel diverticula, severe in the sigmoid colon, but no active diverticulitis. 2. No free air. Trace free fluid in the pelvis is felt secondary to #1. 3. Progressed Cardiomegaly and hiatal hernia since 2010, now with largely intrathoracic stomach. 4. Aortic Atherosclerosis (ICD10-I70.0). Electronically Signed   By: Genevie Ann M.D.   On: 04/01/2021 10:48   '@IMAGES'$ @  Assessment:  Recent change in bowel habits (constipation). Abnormal CT findings of the large intestine (?Mass in distal transverse colon). Partial colonic obstruction. Ddx includes focal colitis (ischemia, acute infection, malignancy). Abnormal CT of colon. Involuntary weight loss. Atrial fibrillation. 7.   Chronic anticoagulant therapy (Coumadin). 8.   Hypokalemia. 9.   Large hiatal hernia.  Recommendations: Clear liquids ONLY by mouth. Serial  KUB. Laxative regimen mainly from below. Consider NPO status for any progressive abdominal distension or symptoms of frank bowel obstruction. Currently, I do not advocate for diagnostic colonoscopy due to current partial obstruction, unless this can be relieved through supportive measures. It is preferable if it can be done after holding coumadin and optimizing bowel function. Appreciate surgery involvement, though currently patient not in need of imminent surgery. Would be valuable to have their opinion as case evolves.  If obstruction worsens or continues, may consider CT colonography to give more definition to mass lesion if surgery is contemplated.  Will follow along.    Thank you for the consult. Please call with questions or concerns.  Olean Ree, "Lanny Hurst MD [facility] [place], [place] [address] [phone]  04/01/2021 1:10 PM

## 2021-04-01 NOTE — ED Notes (Signed)
Pt placed on cardiac monitor. Warm blanket provided as requested

## 2021-04-01 NOTE — ED Notes (Signed)
Dr Joni Fears notified of potassium 2.7, orders to be placed as needed

## 2021-04-01 NOTE — ED Triage Notes (Signed)
Pt c/o generalized abd pain, states she has not had a BM since Sunday, states she has been taking stool softener with no relief. Pt is in NAD at present

## 2021-04-01 NOTE — ED Notes (Addendum)
Pt moved to rm 31 by this Probation officer. Pt placed back on card monitor , pulse ox and bp cuff; pt given a warm blanket. Family remains at bedside, RN notified of pt being in rm. No other needs voiced at this time.

## 2021-04-02 ENCOUNTER — Inpatient Hospital Stay: Payer: Medicare PPO

## 2021-04-02 DIAGNOSIS — R140 Abdominal distension (gaseous): Secondary | ICD-10-CM | POA: Diagnosis not present

## 2021-04-02 DIAGNOSIS — I10 Essential (primary) hypertension: Secondary | ICD-10-CM | POA: Diagnosis not present

## 2021-04-02 DIAGNOSIS — F418 Other specified anxiety disorders: Secondary | ICD-10-CM | POA: Diagnosis not present

## 2021-04-02 DIAGNOSIS — K56609 Unspecified intestinal obstruction, unspecified as to partial versus complete obstruction: Secondary | ICD-10-CM

## 2021-04-02 DIAGNOSIS — K5730 Diverticulosis of large intestine without perforation or abscess without bleeding: Secondary | ICD-10-CM | POA: Diagnosis not present

## 2021-04-02 DIAGNOSIS — I482 Chronic atrial fibrillation, unspecified: Secondary | ICD-10-CM | POA: Diagnosis not present

## 2021-04-02 DIAGNOSIS — K5900 Constipation, unspecified: Secondary | ICD-10-CM | POA: Diagnosis not present

## 2021-04-02 LAB — URINALYSIS, COMPLETE (UACMP) WITH MICROSCOPIC
Bilirubin Urine: NEGATIVE
Glucose, UA: NEGATIVE mg/dL
Ketones, ur: 15 mg/dL — AB
Nitrite: NEGATIVE
Specific Gravity, Urine: 1.015 (ref 1.005–1.030)
pH: 6.5 (ref 5.0–8.0)

## 2021-04-02 LAB — CBC
HCT: 28.8 % — ABNORMAL LOW (ref 36.0–46.0)
Hemoglobin: 9.2 g/dL — ABNORMAL LOW (ref 12.0–15.0)
MCH: 26.3 pg (ref 26.0–34.0)
MCHC: 31.9 g/dL (ref 30.0–36.0)
MCV: 82.3 fL (ref 80.0–100.0)
Platelets: 291 10*3/uL (ref 150–400)
RBC: 3.5 MIL/uL — ABNORMAL LOW (ref 3.87–5.11)
RDW: 13.8 % (ref 11.5–15.5)
WBC: 6 10*3/uL (ref 4.0–10.5)
nRBC: 0 % (ref 0.0–0.2)

## 2021-04-02 LAB — BASIC METABOLIC PANEL
Anion gap: 9 (ref 5–15)
BUN: 8 mg/dL (ref 8–23)
CO2: 24 mmol/L (ref 22–32)
Calcium: 8.2 mg/dL — ABNORMAL LOW (ref 8.9–10.3)
Chloride: 104 mmol/L (ref 98–111)
Creatinine, Ser: 0.56 mg/dL (ref 0.44–1.00)
GFR, Estimated: >60 mL/min (ref >60)
Glucose, Bld: 100 mg/dL — ABNORMAL HIGH (ref 70–99)
Potassium: 3.5 mmol/L (ref 3.5–5.1)
Sodium: 137 mmol/L (ref 135–145)

## 2021-04-02 LAB — PROTIME-INR
INR: 2.5 — ABNORMAL HIGH (ref 0.8–1.2)
Prothrombin Time: 26.9 seconds — ABNORMAL HIGH (ref 11.4–15.2)

## 2021-04-02 LAB — GLUCOSE, CAPILLARY: Glucose-Capillary: 103 mg/dL — ABNORMAL HIGH (ref 70–99)

## 2021-04-02 LAB — ABO/RH: ABO/RH(D): A POS

## 2021-04-02 MED ORDER — ADULT MULTIVITAMIN W/MINERALS CH
1.0000 | ORAL_TABLET | Freq: Every day | ORAL | Status: DC
  Administered 2021-04-04 – 2021-04-18 (×13): 1 via ORAL
  Filled 2021-04-02 (×15): qty 1

## 2021-04-02 MED ORDER — BOOST / RESOURCE BREEZE PO LIQD CUSTOM
1.0000 | Freq: Three times a day (TID) | ORAL | Status: AC
Start: 2021-04-02 — End: 2021-04-04
  Administered 2021-04-02 – 2021-04-04 (×4): 1 via ORAL

## 2021-04-02 MED ORDER — IRBESARTAN 150 MG PO TABS
300.0000 mg | ORAL_TABLET | Freq: Every day | ORAL | Status: DC
  Administered 2021-04-02 – 2021-04-22 (×18): 300 mg via ORAL
  Filled 2021-04-02 (×19): qty 2

## 2021-04-02 NOTE — TOC CM/SW Note (Signed)
Chart reviewed. PCP is Thereasa Distance, MD. On room air. No wounds. Per notes, plan for colectomy with potential colostomy placement tomorrow. No TOC needs identified so far. Please consult if any needs arise.  Dayton Scrape, [place]

## 2021-04-02 NOTE — Progress Notes (Signed)
[facility] at [place] NAME: Gabriela [name]    MR#:  [id number]  DATE OF BIRTH:  March 23, 1932  SUBJECTIVE:  patient came in with abdominal pain and speech loss for few months. Workup in the ER showed large colonic mass. Had some runny stools after she took stool softeners at home. No blood in stool. Daughter at bedside. Denies any vomiting  patient is quite functional at home. Continues to drive according to the daughter.  REVIEW OF SYSTEMS:   Review of Systems  Constitutional:  Negative for chills, fever and weight loss.  HENT:  Negative for ear discharge, ear pain and nosebleeds.   Eyes:  Negative for blurred vision, pain and discharge.  Respiratory:  Negative for sputum production, shortness of breath, wheezing and stridor.   Cardiovascular:  Negative for chest pain, palpitations, orthopnea and PND.  Gastrointestinal:  Positive for abdominal pain. Negative for diarrhea, nausea and vomiting.  Genitourinary:  Negative for frequency and urgency.  Musculoskeletal:  Negative for back pain and joint pain.  Neurological:  Negative for sensory change, speech change, focal weakness and weakness.  Psychiatric/Behavioral:  Negative for depression and hallucinations. The patient is not nervous/anxious.   Tolerating Diet:cld Tolerating PT:   DRUG ALLERGIES:   Allergies  Allergen Reactions  . Celebrex [Celecoxib] Anaphylaxis  . Morphine And Related Swelling    Lips and mouth  . Povidone-Iodine Other (See Comments)    Other reaction(s): ITCHING Other reaction(s): ITCHING   . Sulfa Antibiotics     VITALS:  Blood pressure 128/67, pulse 64, temperature 98.3 F (36.8 C), temperature source Oral, resp. rate 18, height '5\' 3"'$  (1.6 m), weight 64.9 kg, SpO2 98 %.  PHYSICAL EXAMINATION:   Physical Exam  GENERAL:  87 y.o.-year-old patient lying in the bed with no acute distress.  LUNGS: Normal breath sounds bilaterally, no wheezing, rales, rhonchi. No  use of accessory muscles of respiration.  CARDIOVASCULAR: S1, S2 normal. No murmurs, rubs, or gallops.  ABDOMEN: Soft, nontender, + distended. Bowel sounds present. No organomegaly or mass.  EXTREMITIES: No cyanosis, clubbing or edema b/l.    NEUROLOGIC: non focal PSYCHIATRIC:  patient is alert and oriented x 3.  SKIN: No obvious rash, lesion, or ulcer.   LABORATORY PANEL:  CBC Recent Labs  Lab 04/02/21 0728  WBC 6.0  HGB 9.2*  HCT 28.8*  PLT 291    Chemistries  Recent Labs  Lab 04/01/21 0857 04/02/21 0728  NA 138 137  K 2.7* 3.5  CL 102 104  CO2 25 24  GLUCOSE 129* 100*  BUN 9 8  CREATININE 0.62 0.56  CALCIUM 9.1 8.2*  MG 2.1  --   AST 18  --   ALT 8  --   ALKPHOS 70  --   BILITOT 0.8  --    Cardiac Enzymes No results for input(s): TROPONINI in the last 168 hours. RADIOLOGY:  CT ABDOMEN PELVIS W CONTRAST  Result Date: 04/01/2021 CLINICAL DATA:  87 year old female with abdominal pain and distension. History of large bowel diverticulosis. EXAM: CT ABDOMEN AND PELVIS WITH CONTRAST TECHNIQUE: Multidetector CT imaging of the abdomen and pelvis was performed using the standard protocol following bolus administration of intravenous contrast. CONTRAST:  31m OMNIPAQUE IOHEXOL 350 MG/ML SOLN COMPARISON:  CT Abdomen and Pelvis 03/11/2009. Abdomen ultrasound 04/29/2017. FINDINGS: Lower chest: Chronic cardiomegaly appears progressed since 2010. No pericardial effusion. Tortuous descending thoracic aorta with calcified atherosclerosis. Moderate to large chronic hiatal hernia has progressed since  2010 and is eccentric to the right as before. Near complete intrathoracic stomach which is mostly decompressed today. Associated atelectasis but otherwise negative lung bases. No pleural effusion. Hepatobiliary: Chronically absent gallbladder. No liver lesion identified. Pancreas: Atrophied. Spleen: Negative. Adrenals/Urinary Tract: Normal adrenal glands. Nonobstructed kidneys. Chronic exophytic  left renal cysts are benign with simple fluid density and have not significantly changed since 2010. There is symmetric renal contrast excretion on the delayed images. Ureters are within normal limits. Negative bladder. Stomach/Bowel: Decompressed rectum. Extensive diverticulosis of the sigmoid colon which is redundant in the pelvis. Indistinct appearance of the sigmoid, but no convincing focal active inflammation. Diverticulosis continues through the descending colon which is decompressed. No descending inflammation. However, the distal transverse colon and splenic flexure are abnormal. There is an abrupt caliber change in the distal transverse colon best seen on series 6, image 19 where an apple core tumor of the bowel can not be excluded. And delayed images which also demonstrate an unchanged appearance of the tapered transverse colon on series 4, image 43. But there is continued somewhat circumferential bowel wall thickening (series 2, image 29) up to nearly 10 mm toward the splenic flexure. The splenic flexure is decompressed and indistinct. Upstream of the distal transverse colon there is increased retained stool and distension of the large bowel to the cecum. And there is mild mesenteric stranding in those segments. Mild right colon diverticulosis. Diminutive or absent appendix. Terminal ileum is within normal limits. No dilated small bowel. Largely intrathoracic stomach. Duodenum is negative. No free air. No free fluid identified in the abdomen. Vascular/Lymphatic: Aortoiliac calcified atherosclerosis. Major arterial structures remain patent. Patent portal venous system. No lymphadenopathy. Reproductive: Absent uterus. Ovaries are diminutive and normal. Numerous pelvic phleboliths. Other: There is trace free fluid in the pelvis with simple fluid density (coronal image 60). Musculoskeletal: Dextroconvex lumbar scoliosis. Advanced lower thoracic and upper lumbar disc and endplate degeneration. Widespread  advanced lumbar facet degeneration. Left lumbosacral junction assimilation joint. Hip joint degeneration. No acute osseous abnormality identified. IMPRESSION: 1. Abnormal large bowel. There is an abrupt caliber change in the distal transverse colon (series 2, image 36) with upstream colon distended with stool and appearing mildly inflamed. An obstructing Apple Core Tumor is not be excluded. Follow-up Endoscopy, or failing that Virtual Colonoscopy, is recommended. But from that lesion to the splenic flexure there is also somewhat generalized bowel wall thickening which would be more typical of colitis. Underlying large bowel diverticula, severe in the sigmoid colon, but no active diverticulitis. 2. No free air. Trace free fluid in the pelvis is felt secondary to #1. 3. Progressed Cardiomegaly and hiatal hernia since 2010, now with largely intrathoracic stomach. 4. Aortic Atherosclerosis (ICD10-I70.0). Electronically Signed   By: Genevie Ann M.D.   On: 04/01/2021 10:48   DG Abd 2 Views  Result Date: 04/02/2021 CLINICAL DATA:  87 year old female with abdominal pain. Abnormal transverse colon on CT yesterday. EXAM: ABDOMEN - 2 VIEW COMPARISON:  CT Abdomen and Pelvis 04/01/2021. FINDINGS: Upright and supine views of the abdomen and pelvis. Air-fluid level in the right colon and/or hepatic flexure on the upright view. No pneumoperitoneum. Stable cardiomegaly and increased right lung base opacity which reflects the known hiatal hernia. Overall nonobstructed bowel-gas pattern elsewhere similar to that on the CT yesterday, no dilated small bowel. Excreted IV contrast in the bladder. Pelvic phleboliths. Stable visualized osseous structures. IMPRESSION: 1. Stable bowel-gas pattern to the CT yesterday, aside from new air-fluid level in the right colon now. As  before there is no small bowel dilatation. No free air. 2. Cardiomegaly.  Hiatal hernia. Electronically Signed   By: Genevie Ann M.D.   On: 04/02/2021 07:38   ASSESSMENT AND  PLAN:  JOSEY TREICHEL is a 87 y.o. female with medical history significant of hypertension, stroke, TIA, GERD, hypothyroidism, depression with anxiety, hard of hearing, hiatal hernia, atrial fibrillation on Coumadin, breast cancer, bladder cancer, anemia, who presents with abdominal pain and constipation.   Abdominal pain/Large bowel obstruction (partial) due to Colon mass worrisome for malignancy -- CT scan findings are concerning for thickening of the distal transverse colon with proximal dilation. Cannot r/o obstructing malignancy.  --Dr. Alice Reichert of GI is consulted--> recommend Laxative regimen and not recommend colonoscopy.  --Surgery consult with Dr. Christian Mate --> for colectomy and ?colostomy on 9/9.  -clear diet now, and NPO at midnight on Thursday  -prn Zofran for nausea and fentanyl for pain -IV fluid: 75 cc/h for normal CD   Hypertension -IV hydralazine as needed -Amlodipine, Cardizem   Hypothyroidism -Synthroid   Stroke [facility]): Patient is on Coumadin for A. fib -Hold Coumadin --INR 2.5--hold IV heparin bridge for now--will likely need it post-op depending on INR. --d/w pt's dter, and surgery   Hypokalemia: Potassium 2.7. repleted   Depression with anxiety -Continue home medications   Atrial fibrillation, chronic (HCC) -Hold Coumadin -Cardizem --cardiology consult for pre-op clearance with dr Clayborn Bigness   Normocytic anemia: Hemoglobin stable, 10.3 today, 0.8 on 12/09/2019 -Follow-up with CBC   DVT ppx: SCD Code Status: DNR   Family Communication:  Yes, patient's daughger at bed side Disposition Plan:  Anticipate discharge back to previous environment Consults called:  Dr. Alice Reichert of GI and Dr. Christian Mate  Admission status and Level of care: Med-Surg:    Med-surg bed for obs as inpt        Status is: Inpatient   Remains inpatient appropriate because:Inpatient level of care appropriate due to severity of illness   Dispo: The patient is from: Home               Anticipated d/c is to:  to be determined              Patient currently is not medically stable to d/c.              Difficult to place patient No    TOTAL TIME TAKING CARE OF THIS PATIENT: 25 minutes.  >50% time spent on counselling and coordination of care  Note: This dictation was prepared with Dragon dictation along with smaller phrase technology. Any transcriptional errors that result from this process are unintentional.  Gabriela [name] M.D    [facility]   CC: Primary care physician; [name], MD Patient ID: Gabriela [name], female   DOB: 1931-08-04, 87 y.o.   MRN: [id number]

## 2021-04-02 NOTE — Progress Notes (Signed)
Mobility Specialist - Progress Note   04/02/21 1600  Mobility  Activity Transferred to/from [facility]  Level of Assistance Minimal assist, patient does 75% or more  Assistive Device None  Distance Ambulated (ft) 2 ft  Mobility Out of bed for toileting  Mobility Response Tolerated well  Mobility performed by Mobility specialist  $Mobility charge 1 Mobility    Pt transferred bed-BSC with HHA. No LOB. Returned to bed with alarm set, needs in reach.     Kathee Delton Mobility Specialist 04/02/21, 4:12 PM

## 2021-04-02 NOTE — Progress Notes (Signed)
[facility] GI inpatient brief Progress Note  Patient reportedly clinically improved at this time with results from laxative therapy. Surgical consult and recommendations reviewed and appreciated. Laparotomy with partial colon resection planned for tomorrow. KUB this AM shows no obvious obstruction and an improved bowel gas pattern. Partial colon obstruction continues, however from an anatomic standpoint.  Vitals:   04/02/21 0520 04/02/21 0754  BP: (!) 147/80 128/67  Pulse: 86 64  Resp:  18  Temp: 98.3 F (36.8 C) 98.3 F (36.8 C)  SpO2: 97% 98%    Impression:  Distal transverse colon mass most likely, noted on CT. 2.   Partial colon obstruction secondary to above. 3.   Anticoagulation therapy - Coumadin reportedly held in preparation for surgery.   Plan:  Agree with and appreciate surgical consult and recommendations. 2.   Will sign off for now. Call back in the interim if needed.   Thank you  T. Madolyn Frieze, M.D. ABIM Diplomate in Gastroenterology [place] [phone] - Cell

## 2021-04-02 NOTE — Consult Note (Signed)
WOC Nurse Consult Note: [place] Nurse requested for preoperative stoma site marking for an ileostomy by Dr. Christian Mate.  Discussed surgical procedure and stoma creation with patient and family.  Explained role of the [place] nurse team.  Patient is with nausea and dry heaves today and 2 episodes of diarrhea during my assessment and visit. Daughter is with patient and assists me. After marking and cleaning patient x2, Bedside RN called for antiemetic medication and NT arrives and is present for the patient's third liquid stool.    Examined patient lying, sitting, and standing in order to place the marking in the patient's visual field, away from any creases or abdominal contour issues and within the rectus muscle.  Attempted to mark below the patient's belt line. A deep crease in in the upper quadrant, between the two markings below. Two sites are selected for markings and are labeled #1 and #2.   #1 Marked for ileostomy in the RUQ  5cm to the right of the umbilicus and  3cm above the umbilicus. #2 Marked for ileostomy in the RUQ  4cm to the right of the umbilicus and  [id number] above the umbilicus.  Patient's abdomen cleansed with CHG wipes at site markings, allowed to air dry prior to marking. Covered marks with thin film transparent dressing to preserve mark until date of surgery (tomorrow, 04/03/21).   Thank you for the opportunity to meet and mark this nice patient who was very pleasant, but unfortunately, in distress.  Maudie Flakes, MSN, RN, [place], Arther Abbott  Pager# [phone]

## 2021-04-02 NOTE — Progress Notes (Signed)
Initial Nutrition Assessment  DOCUMENTATION CODES:   Not applicable  INTERVENTION:   Boost Breeze po TID, each supplement provides 250 kcal and 9 grams of protein  MVI po daily   Pt at high refeed risk; recommend monitor potassium, magnesium and phosphorus labs daily until stable  NUTRITION DIAGNOSIS:   Inadequate oral intake related to acute illness as evidenced by other (comment) (pt on clear liquid diet).  GOAL:   Patient will meet greater than or equal to 90% of their needs  MONITOR:   PO intake, Supplement acceptance, Diet advancement, Labs, Weight trends, Skin, I & O's  REASON FOR ASSESSMENT:   Malnutrition Screening Tool    ASSESSMENT:   87 y.o. female with medical history significant of hypertension, stroke, TIA, GERD, hypothyroidism, depression with anxiety, hard of hearing, hiatal hernia, atrial fibrillation on Coumadin, breast cancer, bladder cancer, Afib and anemia who presents with abdominal pain and constipation and was found to have obstructing colon mass.  Met with pt and family in room today. Pt reports poor appetite and oral intake for 3-4 days pta r/t abdominal pain, diarrhea and nausea. Pt advanced to a clear liquid diet 9/7. Pt reports eating some broth and jello in hospital. Of note, pt does not like green jello. Pt reports some nausea and diarrhea after eating today. Pt reports that she is scared to eat too much as she does not want to experience pain and diarrhea. Per chart, pt is down 14lbs(9%) over the past month; this is significant weight loss. Pt's UBW is around 149-151lbs. RD discussed with pt and family the importance of adequate nutrition needed to preserve lean muscle and to support post op healing. Pt is willing to drink peach Boost Breeze and chocolate Ensure once her diet is advanced. Pt is likely at high refeed risk. Plan is for surgical intervention ([facility]'s) 9/9.    Medications reviewed and include: dulcolax, synthroid, protonix, miralax,  NaCl $Rem'@75ml'lehX$ /hr   Labs reviewed: K 3.5 wnl Hgb 9.2(L), Hct 28.8(L)  NUTRITION - FOCUSED PHYSICAL EXAM:  Flowsheet Row Most Recent Value  Orbital Region No depletion  Upper Arm Region No depletion  Thoracic and Lumbar Region No depletion  Buccal Region No depletion  Temple Region Mild depletion  Clavicle Bone Region Mild depletion  Clavicle and Acromion Bone Region Mild depletion  Scapular Bone Region No depletion  Dorsal Hand Mild depletion  Patellar Region Mild depletion  Anterior Thigh Region No depletion  Posterior Calf Region No depletion  Edema (RD Assessment) None  Hair Reviewed  Eyes Reviewed  Mouth Reviewed  Skin Reviewed  Nails Reviewed   Diet Order:   Diet Order             Diet clear liquid Room service appropriate? Yes; Fluid consistency: Thin  Diet effective now                  EDUCATION NEEDS:   Education needs have been addressed  Skin:  Skin Assessment: Reviewed RN Assessment  Last BM:  9/8- type 7  Height:   Ht Readings from Last 1 Encounters:  04/01/21 $RemoveB'5\' 3"'SZBqolMA$  (1.6 m)    Weight:   Wt Readings from Last 1 Encounters:  04/01/21 64.9 kg    Ideal Body Weight:  52.3 kg  BMI:  Body mass index is 25.33 kg/m.  Estimated Nutritional Needs:   Kcal:  1500-1700kcal/day  Protein:  75-85g/day  Fluid:  1.3-1.5L/day  Koleen Distance MS, RD, LDN Please refer to [facility] for RD and/or  RD on-call/weekend/after hours pager

## 2021-04-02 NOTE — Progress Notes (Signed)
[facility] SURGICAL PROGRESS NOTE (cpt [phone])  Hospital Day(s): 1.   Interval History: Patient seen and examined, no acute events or new complaints overnight. Patient reports she feels a little improved this morning. She denies fever, chills, nausea, emesis, abdominal pain. She did report 3 loose BMs overnight. She remains without leukocytosis at 6.0 and Hgb down to 9.2 but suspect this is dilutional. Renal function remains normal; sCr - 0.56; UO - unmeasured. Her previous hypokalemia is resolved. She seems to be tolerating CLD without issues. Still tentatively planned for surgery tomorrow (09/09)  Review of Systems:  Constitutional: denies fever, chills  HEENT: denies cough or congestion  Respiratory: denies any shortness of breath  Cardiovascular: denies chest pain or palpitations  Gastrointestinal: denies abdominal pain, N/V, + diarrhea Genitourinary: denies burning with urination or urinary frequency  Vital signs in last 24 hours: [min-max] current  Temp:  [98.3 F (36.8 C)-99.2 F (37.3 C)] 98.3 F (36.8 C) (09/08 0520) Pulse Rate:  [70-91] 86 (09/08 0520) Resp:  [16-18] 18 (09/07 1957) BP: (136-158)/(68-91) 147/80 (09/08 0520) SpO2:  [96 %-100 %] 97 % (09/08 0520) Weight:  [64.9 kg] 64.9 kg (09/07 0836)     Height: '5\' 3"'$  (160 cm) Weight: 64.9 kg BMI (Calculated): 25.34   Intake/Output last 2 shifts:  09/07 0701 - 09/08 0700 In: 1344.7 [I.V.:871.1; IV Piggyback:473.5] Out: -    Physical Exam:  Constitutional: alert, cooperative and no distress  HENT: normocephalic without obvious abnormality  Eyes: PERRL, EOM's grossly intact and symmetric  Respiratory: breathing non-labored at rest  Cardiovascular: regular rate and sinus rhythm  Gastrointestinal: soft, non-tender, distension seems improved, no rebound/guarding  Musculoskeletal: no edema or wounds, motor and sensation grossly intact, NT    Labs:  CBC Latest Ref Rng & Units 04/01/2021 07/10/2020 11/28/2017   WBC 4.0 - 10.5 K/uL 7.4 4.8 7.2  Hemoglobin 12.0 - 15.0 g/dL 10.3(L) 8.0(L) 9.7(L)  Hematocrit 36.0 - 46.0 % 31.1(L) 28.3(L) 30.2(L)  Platelets 150 - 400 K/uL 334 344 359   CMP Latest Ref Rng & Units 04/01/2021 07/10/2020 11/28/2017  Glucose 70 - 99 mg/dL 129(H) 124(H) 127(H)  BUN 8 - 23 mg/dL '9 14 19  '$ Creatinine 0.44 - 1.00 mg/dL 0.62 0.68 0.61  Sodium 135 - 145 mmol/L 138 141 138  Potassium 3.5 - 5.1 mmol/L 2.7(LL) 3.7 3.6  Chloride 98 - 111 mmol/L 102 106 105  CO2 22 - 32 mmol/L '25 25 25  '$ Calcium 8.9 - 10.3 mg/dL 9.1 8.9 9.0  Total Protein 6.5 - 8.1 g/dL 7.3 7.5 7.4  Total Bilirubin 0.3 - 1.2 mg/dL 0.8 0.6 0.3  Alkaline Phos 38 - 126 U/L 70 56 62  AST 15 - 41 U/L '18 23 21  '$ ALT 0 - 44 U/L 8 10 10(L)     Imaging studies:   KUB (04/02/2021) personally reviewed still with some colonic dilation and air fluid level, no free air, and radiologist report reviewed below:  IMPRESSION: 1. Stable bowel-gas pattern to the CT yesterday, aside from new air-fluid level in the right colon now. As before there is no small bowel dilatation. No free air. 2. Cardiomegaly.  Hiatal hernia.   Assessment/Plan: (ICD-10's: K41.89) 87 y.o. female with progressive abdominal distension and constipation found to have thickening of the distal transverse colon with proximal dilation concerning for partially/near complete obstructing malignancy   - Tentatively plan for surgery tomorrow (09/09) with Dr Christian Mate; we will continue to reassess  - Okay to continue CLD for now;  do not advance; NPO at midnight   - Continue IVF  - Monitor abdominal examination             - Pain control prn; antiemetics prn  - Mobilize as tolerated             - DVT prophylaxis; hold for potential surgery  - Appreciate GI recommendations             - Further management per primary service; we will follow    All of the above findings and recommendations were discussed with the patient, and the medical team, and all of patient's  questions were answered to her expressed satisfaction.  -- Edison Simon, PA-C [facility] 04/02/2021, 7:26 AM [phone] M-F: 7am - 4pm

## 2021-04-02 NOTE — Consult Note (Signed)
CARDIOLOGY CONSULT NOTE               Patient ID: Gabriela [name] MRN: [facility]:[id number] DOB/AGE: August 01, 1931 87 y.o.  Admit date: 04/01/2021 Referring Physician Dr Ivor Costa hospitalist Primary Physician Dr. Thereasa Distance primary Primary Cardiologist Nehemiah Massed Reason for Consultation preop clearance for abdominal surgery anticoagulation bridging recommendations  HPI: Patient is a 87 year old female history of atrial fibrillation previous TIA CVA hypertension GERD hypothyroidism depression anxiety patient said hiatal hernia has been on Coumadin.  Patient was found to have significant abdominal pain and constipation of the last 3 days patient also developed some distention resulting in some mild shortness of breath.  Patient appears to have some measure of partial bowel obstruction requiring surgical intervention.  Patient has been on Coumadin and has had a previous CVA on interruption of her Coumadin therapy for procedure the patient is here for possible bridging prior and after her abdominal surgery  Review of systems complete and found to be negative unless listed above     Past Medical History:  Diagnosis Date   Acid reflux    Anxiety    Arrhythmia    Bladder cancer ([place])    Breast cancer ([place])    Cancer ([place])    breast,bladder   Depression    Dysrhythmia    afib   GERD (gastroesophageal reflux disease)    History of hiatal hernia    HOH (hard of hearing)    aids   Hypertension    Hyperthyroidism    Hypothyroidism    Stroke ([place])    2010   TIA (transient ischemic attack)     Past Surgical History:  Procedure Laterality Date   ABDOMINAL HYSTERECTOMY     ANKLE ARTHODESIS W/ ARTHROSCOPY     BLADDER SURGERY     CATARACT EXTRACTION W/PHACO Left 12/02/2014   Procedure: CATARACT EXTRACTION PHACO AND INTRAOCULAR LENS PLACEMENT ([place]);  Surgeon: Estill Cotta, MD;  Location: [facility] ORS;  Service: Ophthalmology;  Laterality: Left;  US01:45 AP%25.7 CDE42.99   CHOLECYSTECTOMY      EYE SURGERY     cataract   JOINT REPLACEMENT     bil tkr   MASTECTOMY     partial right   REPLACEMENT TOTAL KNEE      Medications Prior to Admission  Medication Sig Dispense Refill Last Dose   albuterol (VENTOLIN HFA) 108 (90 Base) MCG/ACT inhaler 2 puffs q.i.d. p.r.n. short of breath, wheezing, or cough   Past Month   ALPRAZolam (XANAX) 0.25 MG tablet Take 0.125 mg by mouth at bedtime as needed for anxiety.   03/31/2021   amLODipine (NORVASC) 5 MG tablet Take 5 mg by mouth daily.   03/31/2021   levothyroxine (SYNTHROID, LEVOTHROID) 112 MCG tablet Take 112 mcg by mouth daily before breakfast.   04/01/2021   pantoprazole (PROTONIX) 40 MG tablet Take 40 mg by mouth 2 (two) times daily.      sertraline (ZOLOFT) 50 MG tablet Take 50 mg by mouth daily. hs   03/31/2021   valsartan (DIOVAN) 320 MG tablet Take 320 mg by mouth daily.      warfarin (COUMADIN) 3 MG tablet Take 3 mg by mouth daily. Sat sun mon tues   03/31/2021   warfarin (COUMADIN) 4 MG tablet Take 4 mg by mouth daily. Wed thurs fri   03/27/2021   Social History   Socioeconomic History   Marital status: Widowed    Spouse name: Not on file   Number of children: Not on file  Years of education: Not on file   Highest education level: Not on file  Occupational History   Not on file  Tobacco Use   Smoking status: Never   Smokeless tobacco: Never  Substance and Sexual Activity   Alcohol use: No   Drug use: Never   Sexual activity: Not on file  Other Topics Concern   Not on file  Social History Narrative   Not on file   Social Determinants of Health   Financial Resource Strain: Not on file  Food Insecurity: Not on file  Transportation Needs: Not on file  Physical Activity: Not on file  Stress: Not on file  Social Connections: Not on file  Intimate Partner Violence: Not on file    Family History  Adopted: Yes      Review of systems complete and found to be negative unless listed above      PHYSICAL EXAM  General:  Well developed, well nourished, in no acute distress HEENT:  Normocephalic and atramatic Neck:  No JVD.  Lungs: Clear bilaterally to auscultation and percussion. Heart: Irregular irregular. Normal S1 and S2 without gallops or murmurs.  Abdomen: Bowel sounds are positive, abdomen distended with tenderness Msk:  Back normal, normal gait. Normal strength and tone for age. Extremities: No clubbing, cyanosis or edema.   Neuro: Alert and oriented X 3. Psych:  Good affect, responds appropriately  Labs:   Lab Results  Component Value Date   WBC 6.0 04/02/2021   HGB 9.2 (L) 04/02/2021   HCT 28.8 (L) 04/02/2021   MCV 82.3 04/02/2021   PLT 291 04/02/2021    Recent Labs  Lab 04/01/21 0857 04/02/21 0728  NA 138 137  K 2.7* 3.5  CL 102 104  CO2 25 24  BUN 9 8  CREATININE 0.62 0.56  CALCIUM 9.1 8.2*  PROT 7.3  --   BILITOT 0.8  --   ALKPHOS 70  --   ALT 8  --   AST 18  --   GLUCOSE 129* 100*   Lab Results  Component Value Date   CKTOTAL 72 08/17/2011   CKMB 0.8 08/17/2011   TROPONINI <0.03 11/28/2017   No results found for: CHOL No results found for: HDL No results found for: LDLCALC No results found for: TRIG No results found for: CHOLHDL No results found for: LDLDIRECT    Radiology: CT ABDOMEN PELVIS W CONTRAST  Result Date: 04/01/2021 CLINICAL DATA:  87 year old female with abdominal pain and distension. History of large bowel diverticulosis. EXAM: CT ABDOMEN AND PELVIS WITH CONTRAST TECHNIQUE: Multidetector CT imaging of the abdomen and pelvis was performed using the standard protocol following bolus administration of intravenous contrast. CONTRAST:  5m OMNIPAQUE IOHEXOL 350 MG/ML SOLN COMPARISON:  CT Abdomen and Pelvis 03/11/2009. Abdomen ultrasound 04/29/2017. FINDINGS: Lower chest: Chronic cardiomegaly appears progressed since 2010. No pericardial effusion. Tortuous descending thoracic aorta with calcified atherosclerosis. Moderate to large chronic hiatal hernia has  progressed since 2010 and is eccentric to the right as before. Near complete intrathoracic stomach which is mostly decompressed today. Associated atelectasis but otherwise negative lung bases. No pleural effusion. Hepatobiliary: Chronically absent gallbladder. No liver lesion identified. Pancreas: Atrophied. Spleen: Negative. Adrenals/Urinary Tract: Normal adrenal glands. Nonobstructed kidneys. Chronic exophytic left renal cysts are benign with simple fluid density and have not significantly changed since 2010. There is symmetric renal contrast excretion on the delayed images. Ureters are within normal limits. Negative bladder. Stomach/Bowel: Decompressed rectum. Extensive diverticulosis of the sigmoid colon which is redundant  in the pelvis. Indistinct appearance of the sigmoid, but no convincing focal active inflammation. Diverticulosis continues through the descending colon which is decompressed. No descending inflammation. However, the distal transverse colon and splenic flexure are abnormal. There is an abrupt caliber change in the distal transverse colon best seen on series 6, image 19 where an apple core tumor of the bowel can not be excluded. And delayed images which also demonstrate an unchanged appearance of the tapered transverse colon on series 4, image 43. But there is continued somewhat circumferential bowel wall thickening (series 2, image 29) up to nearly 10 mm toward the splenic flexure. The splenic flexure is decompressed and indistinct. Upstream of the distal transverse colon there is increased retained stool and distension of the large bowel to the cecum. And there is mild mesenteric stranding in those segments. Mild right colon diverticulosis. Diminutive or absent appendix. Terminal ileum is within normal limits. No dilated small bowel. Largely intrathoracic stomach. Duodenum is negative. No free air. No free fluid identified in the abdomen. Vascular/Lymphatic: Aortoiliac calcified  atherosclerosis. Major arterial structures remain patent. Patent portal venous system. No lymphadenopathy. Reproductive: Absent uterus. Ovaries are diminutive and normal. Numerous pelvic phleboliths. Other: There is trace free fluid in the pelvis with simple fluid density (coronal image 60). Musculoskeletal: Dextroconvex lumbar scoliosis. Advanced lower thoracic and upper lumbar disc and endplate degeneration. Widespread advanced lumbar facet degeneration. Left lumbosacral junction assimilation joint. Hip joint degeneration. No acute osseous abnormality identified. IMPRESSION: 1. Abnormal large bowel. There is an abrupt caliber change in the distal transverse colon (series 2, image 36) with upstream colon distended with stool and appearing mildly inflamed. An obstructing Apple Core Tumor is not be excluded. Follow-up Endoscopy, or failing that Virtual Colonoscopy, is recommended. But from that lesion to the splenic flexure there is also somewhat generalized bowel wall thickening which would be more typical of colitis. Underlying large bowel diverticula, severe in the sigmoid colon, but no active diverticulitis. 2. No free air. Trace free fluid in the pelvis is felt secondary to #1. 3. Progressed Cardiomegaly and hiatal hernia since 2010, now with largely intrathoracic stomach. 4. Aortic Atherosclerosis (ICD10-I70.0). Electronically Signed   By: Genevie Ann M.D.   On: 04/01/2021 10:48   DG Abd 2 Views  Result Date: 04/02/2021 CLINICAL DATA:  87 year old female with abdominal pain. Abnormal transverse colon on CT yesterday. EXAM: ABDOMEN - 2 VIEW COMPARISON:  CT Abdomen and Pelvis 04/01/2021. FINDINGS: Upright and supine views of the abdomen and pelvis. Air-fluid level in the right colon and/or hepatic flexure on the upright view. No pneumoperitoneum. Stable cardiomegaly and increased right lung base opacity which reflects the known hiatal hernia. Overall nonobstructed bowel-gas pattern elsewhere similar to that on the  CT yesterday, no dilated small bowel. Excreted IV contrast in the bladder. Pelvic phleboliths. Stable visualized osseous structures. IMPRESSION: 1. Stable bowel-gas pattern to the CT yesterday, aside from new air-fluid level in the right colon now. As before there is no small bowel dilatation. No free air. 2. Cardiomegaly.  Hiatal hernia. Electronically Signed   By: Genevie Ann M.D.   On: 04/02/2021 07:38    EKG: Atrial fibrillation rate controlled low voltage nonspecific ST-T changes  ASSESSMENT AND PLAN:  Preop abdominal surgery Hypertension CVA Atrial fibrillation Hypokalemia . Plan Patient appears to be an acceptable surgical risk for abdominal surgery Coumadin currently discontinued still has elevated INR There is one of the rare occasion that I agree with bridging with heparin once INR is below 2.0.  Recommend start heparin without bolus at a drip rate based on weight Recommend have pharmacy help with dosing Atrial fibrillation rate controlled continue current therapy on Cardizem Correct electrolytes No direct testing necessary to modify risk Patient is acceptable surgical risk for abdominal surgery mild to moderate risk.                                            Signed: Yolonda Kida MD 04/02/2021, 11:08 AM

## 2021-04-02 NOTE — Progress Notes (Signed)
Order received from Dr patel to renew telemetry

## 2021-04-03 ENCOUNTER — Encounter
Admission: EM | Disposition: A | Discharge status: Skilled Nursing Facility | Payer: Self-pay | Source: Home / Self Care | Attending: Internal Medicine

## 2021-04-03 ENCOUNTER — Inpatient Hospital Stay: Payer: Medicare PPO

## 2021-04-03 DIAGNOSIS — K56609 Unspecified intestinal obstruction, unspecified as to partial versus complete obstruction: Secondary | ICD-10-CM | POA: Diagnosis not present

## 2021-04-03 DIAGNOSIS — K5900 Constipation, unspecified: Secondary | ICD-10-CM | POA: Diagnosis not present

## 2021-04-03 DIAGNOSIS — K5730 Diverticulosis of large intestine without perforation or abscess without bleeding: Secondary | ICD-10-CM | POA: Diagnosis not present

## 2021-04-03 DIAGNOSIS — F418 Other specified anxiety disorders: Secondary | ICD-10-CM | POA: Diagnosis not present

## 2021-04-03 DIAGNOSIS — I482 Chronic atrial fibrillation, unspecified: Secondary | ICD-10-CM | POA: Diagnosis not present

## 2021-04-03 DIAGNOSIS — I10 Essential (primary) hypertension: Secondary | ICD-10-CM | POA: Diagnosis not present

## 2021-04-03 DIAGNOSIS — R140 Abdominal distension (gaseous): Secondary | ICD-10-CM | POA: Diagnosis not present

## 2021-04-03 LAB — PROTIME-INR
INR: 2.5 — ABNORMAL HIGH (ref 0.8–1.2)
Prothrombin Time: 26.9 seconds — ABNORMAL HIGH (ref 11.4–15.2)

## 2021-04-03 LAB — GLUCOSE, CAPILLARY: Glucose-Capillary: 96 mg/dL (ref 70–99)

## 2021-04-03 SURGERY — COLECTOMY, WITH COLOSTOMY CREATION
Anesthesia: General

## 2021-04-03 SURGICAL SUPPLY — 40 items
BLADE CLIPPER SURG (BLADE) IMPLANT
CHLORAPREP W/TINT 26 (MISCELLANEOUS) ×2 IMPLANT
CLIP VESOCCLUDE LG 6/CT (CLIP) IMPLANT
CLIP VESOCCLUDE MED 6/CT (CLIP) IMPLANT
DRAPE INCISE IOBAN 66X45 STRL (DRAPES) ×2 IMPLANT
DRSG OPSITE POSTOP 4X10 (GAUZE/BANDAGES/DRESSINGS) IMPLANT
DRSG OPSITE POSTOP 4X8 (GAUZE/BANDAGES/DRESSINGS) IMPLANT
ELECT CAUTERY BLADE TIP 2.5 (TIP) ×2
ELECT EZSTD 165MM 6.5IN (MISCELLANEOUS) ×2
ELECT REM PT RETURN 9FT ADLT (ELECTROSURGICAL) ×2
ELECTRODE CAUTERY BLDE TIP 2.5 (TIP) ×1 IMPLANT
ELECTRODE EZSTD 165MM 6.5IN (MISCELLANEOUS) ×1 IMPLANT
ELECTRODE REM PT RTRN 9FT ADLT (ELECTROSURGICAL) ×1 IMPLANT
GAUZE 4X4 16PLY ~~LOC~~+RFID DBL (SPONGE) ×2 IMPLANT
GLOVE SURG ORTHO LTX SZ7.5 (GLOVE) ×8 IMPLANT
GOWN STRL REUS W/ TWL LRG LVL3 (GOWN DISPOSABLE) ×6 IMPLANT
GOWN STRL REUS W/TWL LRG LVL3 (GOWN DISPOSABLE) ×6
KIT TURNOVER KIT A (KITS) ×2 IMPLANT
LIGASURE IMPACT 36 18CM CVD LR (INSTRUMENTS) ×2 IMPLANT
MANIFOLD NEPTUNE II (INSTRUMENTS) ×2 IMPLANT
NS IRRIG 1000ML POUR BTL (IV SOLUTION) ×2 IMPLANT
PACK BASIN MAJOR ARMC (MISCELLANEOUS) ×2 IMPLANT
PACK COLON CLEAN CLOSURE (MISCELLANEOUS) ×2 IMPLANT
RELOAD PROXIMATE 75MM BLUE (ENDOMECHANICALS) IMPLANT
RELOAD STAPLER LINEAR PROX 30 (STAPLE) ×1 IMPLANT
SPONGE T-LAP 18X18 ~~LOC~~+RFID (SPONGE) ×8 IMPLANT
STAPLER GUN LINEAR PROX 60 (STAPLE) ×2 IMPLANT
STAPLER PROXIMATE 75MM BLUE (STAPLE) ×2 IMPLANT
STAPLER RELOAD LINEAR PROX 30 (STAPLE) ×2
STAPLER SKIN PROX 35W (STAPLE) ×2 IMPLANT
SUT CHROMIC 2 0 SH (SUTURE) IMPLANT
SUT PDS AB 0 CT1 27 (SUTURE) ×4 IMPLANT
SUT SILK 2 0 (SUTURE) ×1
SUT SILK 2-0 30XBRD TIE 12 (SUTURE) ×1 IMPLANT
SUT SILK 3-0 (SUTURE) ×2 IMPLANT
SUT VIC AB 3-0 SH 27 (SUTURE) ×2
SUT VIC AB 3-0 SH 27X BRD (SUTURE) ×2 IMPLANT
SYR BULB IRRIG 60ML STRL (SYRINGE) ×2 IMPLANT
TRAY FOLEY SLVR 16FR LF STAT (SET/KITS/TRAYS/PACK) ×2 IMPLANT
WATER STERILE IRR 500ML POUR (IV SOLUTION) ×2 IMPLANT

## 2021-04-03 NOTE — Progress Notes (Signed)
[place] [facility] Day(s): 2.  Interval History:  Patient seen and examined No acute events or new complaints overnight.  Patient reports she continues to get better, abdominal pain resolved, no further abdominal distension No fever, chills, nausea, emesis No new labs or imaging this morning She did tolerate CLD the last two days She has had numerous bowel movements in the last 24 hours; >10  Vital signs in last 24 hours: [min-max] current  Temp:  [98.3 F (36.8 C)-98.5 F (36.9 C)] 98.5 F (36.9 C) (09/09 0431) Pulse Rate:  [64-75] 73 (09/09 0431) Resp:  [16-20] 16 (09/09 0431) BP: (128-149)/(66-72) 149/66 (09/09 0431) SpO2:  [96 %-98 %] 96 % (09/09 0431)     Height: '5\' 3"'$  (160 cm) Weight: 64.9 kg BMI (Calculated): 25.34   Intake/Output last 2 shifts:  09/08 0701 - 09/09 0700 In: 2977.2 [P.O.:960; I.V.:2017.2] Out: 200 [Urine:200]   Physical Exam:  Constitutional: alert, cooperative and no distress  HENT: normocephalic without obvious abnormality  Eyes: PERRL, EOM's grossly intact and symmetric  Respiratory: breathing non-labored at rest  Cardiovascular: regular rate and sinus rhythm  Gastrointestinal: soft, non-tender, distension resolved, no rebound/guarding  Musculoskeletal: no edema or wounds, motor and sensation grossly intact, NT   Labs:  CBC Latest Ref Rng & Units 04/02/2021 04/01/2021 07/10/2020  WBC 4.0 - 10.5 K/uL 6.0 7.4 4.8  Hemoglobin 12.0 - 15.0 g/dL 9.2(L) 10.3(L) 8.0(L)  Hematocrit 36.0 - 46.0 % 28.8(L) 31.1(L) 28.3(L)  Platelets 150 - 400 K/uL 291 334 344   CMP Latest Ref Rng & Units 04/02/2021 04/01/2021 07/10/2020  Glucose 70 - 99 mg/dL 100(H) 129(H) 124(H)  BUN 8 - 23 mg/dL '8 9 14  '$ Creatinine 0.44 - 1.00 mg/dL 0.56 0.62 0.68  Sodium 135 - 145 mmol/L 137 138 141  Potassium 3.5 - 5.1 mmol/L 3.5 2.7(LL) 3.7  Chloride 98 - 111 mmol/L 104 102 106  CO2 22 - 32 mmol/L '24 25 25  '$ Calcium 8.9 - 10.3 mg/dL 8.2(L) 9.1  8.9  Total Protein 6.5 - 8.1 g/dL - 7.3 7.5  Total Bilirubin 0.3 - 1.2 mg/dL - 0.8 0.6  Alkaline Phos 38 - 126 U/L - 70 56  AST 15 - 41 U/L - 18 23  ALT 0 - 44 U/L - 8 10     Imaging studies: No new pertinent imaging studies   Assessment/Plan: (ICD-10's: K76.89) 87 y.o. female with progressive abdominal distension and constipation found to have thickening of the distal transverse colon with proximal dilation concerning for partially/near complete obstructing malignancy   - Given her continued clinical and radiographic improvements along with her continued supratherapeutic INR, we will hold off on surgery right now as her degree of possible colonic obstruction does not seem as severe compared to presentation. I have reached out to GI to determine if there is a role for proceeding potentially with colonoscopy at this point. This may even be able to be in the outpatient setting if she continues to improve. We will also order a barium enema this afternoon to reasssess her colon.    - Continue NPO for now; lkely be okay to resume CLD if no planned procedures this afternoon  - Continue IVF             - Monitor abdominal examination             - Pain control prn; antiemetics prn  - Mobilize as tolerated             -  DVT prophylaxis; hold for potential surgery; monitor INR             - Further management per primary service; we will follow    All of the above findings and recommendations were discussed with the patient, patient's family (son and daughter at bedside), and the medical team, and all of patient's and family's questions were answered to their expressed satisfaction.  -- Edison Simon, PA-C [facility] 04/03/2021, 7:15 AM [phone] M-F: 7am - 4pm

## 2021-04-03 NOTE — Progress Notes (Signed)
[facility] Cardiology    SUBJECTIVE: Patient states she feels better today less abdominal discomfort less pain INR still to anticoagulated at 2.5 hopefully will be improved over the next 24 to 48 hours   Vitals:   04/02/21 1544 04/02/21 1917 04/03/21 0431 04/03/21 0751  BP: (!) 149/66 (!) 141/72 (!) 149/66 135/70  Pulse: 75 75 73 (!) 53  Resp: '18 20 16 17  '$ Temp: 98.4 F (36.9 C) 98.5 F (36.9 C) 98.5 F (36.9 C) 98.8 F (37.1 C)  TempSrc: Oral Oral Oral Oral  SpO2: 97% 96% 96% 97%  Weight:      Height:         Intake/Output Summary (Last 24 hours) at 04/03/2021 1418 Last data filed at 04/03/2021 1324 Gross per 24 hour  Intake 2770.92 ml  Output 0 ml  Net 2770.92 ml      PHYSICAL EXAM  General: Well developed, well nourished, in no acute distress HEENT:  Normocephalic and atramatic Neck:  No JVD.  Lungs: Clear bilaterally to auscultation and percussion. Heart: Irregular. Normal S1 and S2 without gallops or murmurs.  Abdomen: Bowel sounds are positive, abdomen soft and non-tender  Msk:  Back normal, normal gait. Normal strength and tone for age. Extremities: No clubbing, cyanosis or edema.   Neuro: Alert and oriented X 3. Psych:  Good affect, responds appropriately   LABS: Basic Metabolic Panel: Recent Labs    04/01/21 0857 04/02/21 0728  NA 138 137  K 2.7* 3.5  CL 102 104  CO2 25 24  GLUCOSE 129* 100*  BUN 9 8  CREATININE 0.62 0.56  CALCIUM 9.1 8.2*  MG 2.1  --    Liver Function Tests: Recent Labs    04/01/21 0857  AST 18  ALT 8  ALKPHOS 70  BILITOT 0.8  PROT 7.3  ALBUMIN 3.5   Recent Labs    04/01/21 0857  LIPASE 26   CBC: Recent Labs    04/01/21 0857 04/02/21 0728  WBC 7.4 6.0  HGB 10.3* 9.2*  HCT 31.1* 28.8*  MCV 81.2 82.3  PLT 334 291   Cardiac Enzymes: No results for input(s): CKTOTAL, CKMB, CKMBINDEX, TROPONINI in the last 72 hours. BNP: Invalid input(s): POCBNP D-Dimer: No results for input(s): DDIMER in the last 72  hours. Hemoglobin A1C: No results for input(s): HGBA1C in the last 72 hours. Fasting Lipid Panel: No results for input(s): CHOL, HDL, LDLCALC, TRIG, CHOLHDL, LDLDIRECT in the last 72 hours. Thyroid Function Tests: No results for input(s): TSH, T4TOTAL, T3FREE, THYROIDAB in the last 72 hours.  Invalid input(s): FREET3 Anemia Panel: No results for input(s): VITAMINB12, FOLATE, FERRITIN, TIBC, IRON, RETICCTPCT in the last 72 hours.  DG Abd 2 Views  Result Date: 04/03/2021 CLINICAL DATA:  Colonic obstruction EXAM: ABDOMEN - 2 VIEW COMPARISON:  Abdominal x-ray dated April 02, 2021 FINDINGS: No gas-filled dilated loops of bowel. No evidence of pneumatosis. Wall thickening of the descending colon. No acute osseous abnormality. IMPRESSION: No gas-filled dilated loops of bowel. Electronically Signed   By: Yetta Glassman M.D.   On: 04/03/2021 09:41   DG Abd 2 Views  Result Date: 04/02/2021 CLINICAL DATA:  87 year old female with abdominal pain. Abnormal transverse colon on CT yesterday. EXAM: ABDOMEN - 2 VIEW COMPARISON:  CT Abdomen and Pelvis 04/01/2021. FINDINGS: Upright and supine views of the abdomen and pelvis. Air-fluid level in the right colon and/or hepatic flexure on the upright view. No pneumoperitoneum. Stable cardiomegaly and increased right lung base opacity which reflects the  known hiatal hernia. Overall nonobstructed bowel-gas pattern elsewhere similar to that on the CT yesterday, no dilated small bowel. Excreted IV contrast in the bladder. Pelvic phleboliths. Stable visualized osseous structures. IMPRESSION: 1. Stable bowel-gas pattern to the CT yesterday, aside from new air-fluid level in the right colon now. As before there is no small bowel dilatation. No free air. 2. Cardiomegaly.  Hiatal hernia. Electronically Signed   By: Genevie Ann M.D.   On: 04/02/2021 07:38       TELEMETRY: Patient still in atrial fibrillation rate controlled:  ASSESSMENT AND PLAN:  Principal Problem:    Abdominal pain Active Problems:   Hypertension   Hypothyroidism   Stroke ([place])   Hypokalemia   Depression with anxiety   Atrial fibrillation, chronic (HCC)   Normocytic anemia  Pre-op Abd surgery  PLan Preop for abdominal surgery patient is acceptable risk awaiting anticoagulation to be improved INR 2.5 hopefully after holding Coumadin it can drop below 2 then will start heparin to bridge for hopefully the procedure on Monday History of CVA after not bridging bridging for procedure in the past we will continue current therapy with Coumadin after the procedure Atrial fibrillation reasonably controlled anticoagulation with Coumadin being held currently preop for surgery Continue hypertension management and control Cardiology will continue to follow.  Postop     Yolonda Kida, MD 04/03/2021 2:18 PM

## 2021-04-03 NOTE — Plan of Care (Signed)
Continuing with plan of care. 

## 2021-04-03 NOTE — Progress Notes (Signed)
[facility] Gastroenterology Inpatient Progress Note  Subjective: Patient seen per request of Hospitalist Dr. Posey Pronto and Surgical service for GI opinion requested by family.  Barium enema ordered to evaluate partial colon obstruction. Patient having multiple BM's despite refusing laxative therapy (dulcolax supp). Has not eaten solid food in 3 days, tolerating clears.  Objective: Vital signs in last 24 hours: Temp:  [98.4 F (36.9 C)-98.8 F (37.1 C)] 98.8 F (37.1 C) (09/09 0751) Pulse Rate:  [53-75] 53 (09/09 0751) Resp:  [16-20] 17 (09/09 0751) BP: (135-149)/(66-72) 135/70 (09/09 0751) SpO2:  [96 %-97 %] 97 % (09/09 0751) Blood pressure 135/70, pulse (!) 53, temperature 98.8 F (37.1 C), temperature source Oral, resp. rate 17, height '5\' 3"'$  (1.6 m), weight 64.9 kg, SpO2 97 %.    Intake/Output from previous day: 09/08 0701 - 09/09 0700 In: 2977.2 [P.O.:960; I.V.:2017.2] Out: 200 [Urine:200]  Intake/Output this shift: Total I/O In: 753.8 [I.V.:753.8] Out: -    Gen: NAD. Appears comfortable.  HEENT: [place]/AT. PERRLA. Normal external ear exam.  Chest: CTA, no wheezes.  CV: RR nl S1, S2. No gallops.  Abd: soft, nt, nd. BS+  Ext: no edema. Pulses 2+  Neuro: Alert and oriented. Judgement appears normal. Nonfocal.   Lab Results: Results for orders placed or performed during the hospital encounter of 04/01/21 (from the past 24 hour(s))  Glucose, capillary     Status: None   Collection Time: 04/03/21  7:52 AM  Result Value Ref Range   Glucose-Capillary 96 70 - 99 mg/dL  Protime-INR     Status: Abnormal   Collection Time: 04/03/21  7:55 AM  Result Value Ref Range   Prothrombin Time 26.9 (H) 11.4 - 15.2 seconds   INR 2.5 (H) 0.8 - 1.2     Recent Labs    04/01/21 0857 04/02/21 0728  WBC 7.4 6.0  HGB 10.3* 9.2*  HCT 31.1* 28.8*  PLT 334 291   BMET Recent Labs    04/01/21 0857 04/02/21 0728  NA 138 137  K 2.7* 3.5  CL 102 104  CO2 25 24  GLUCOSE 129*  100*  BUN 9 8  CREATININE 0.62 0.56  CALCIUM 9.1 8.2*   LFT Recent Labs    04/01/21 0857  PROT 7.3  ALBUMIN 3.5  AST 18  ALT 8  ALKPHOS 70  BILITOT 0.8   PT/INR Recent Labs    04/02/21 1026 04/03/21 0755  LABPROT 26.9* 26.9*  INR 2.5* 2.5*   Hepatitis Panel No results for input(s): HEPBSAG, HCVAB, HEPAIGM, HEPBIGM in the last 72 hours. C-Diff No results for input(s): CDIFFTOX in the last 72 hours. No results for input(s): CDIFFPCR in the last 72 hours.   Studies/Results: DG Abd 2 Views  Result Date: 04/03/2021 CLINICAL DATA:  Colonic obstruction EXAM: ABDOMEN - 2 VIEW COMPARISON:  Abdominal x-ray dated April 02, 2021 FINDINGS: No gas-filled dilated loops of bowel. No evidence of pneumatosis. Wall thickening of the descending colon. No acute osseous abnormality. IMPRESSION: No gas-filled dilated loops of bowel. Electronically Signed   By: Yetta Glassman M.D.   On: 04/03/2021 09:41   DG Abd 2 Views  Result Date: 04/02/2021 CLINICAL DATA:  87 year old female with abdominal pain. Abnormal transverse colon on CT yesterday. EXAM: ABDOMEN - 2 VIEW COMPARISON:  CT Abdomen and Pelvis 04/01/2021. FINDINGS: Upright and supine views of the abdomen and pelvis. Air-fluid level in the right colon and/or hepatic flexure on the upright view. No pneumoperitoneum. Stable cardiomegaly and increased right lung base  opacity which reflects the known hiatal hernia. Overall nonobstructed bowel-gas pattern elsewhere similar to that on the CT yesterday, no dilated small bowel. Excreted IV contrast in the bladder. Pelvic phleboliths. Stable visualized osseous structures. IMPRESSION: 1. Stable bowel-gas pattern to the CT yesterday, aside from new air-fluid level in the right colon now. As before there is no small bowel dilatation. No free air. 2. Cardiomegaly.  Hiatal hernia. Electronically Signed   By: Genevie Ann M.D.   On: 04/02/2021 07:38    Scheduled Inpatient Medications:    amLODipine  5 mg Oral  Daily   bisacodyl  10 mg Rectal Once   feeding supplement  1 Container Oral TID BM   irbesartan  300 mg Oral Daily   levothyroxine  112 mcg Oral QAC breakfast   multivitamin with minerals  1 tablet Oral Daily   pantoprazole  40 mg Oral Daily   polyethylene glycol  17 g Oral Daily   sertraline  50 mg Oral QHS    Continuous Inpatient Infusions:    sodium chloride 75 mL/hr at 04/03/21 1324    PRN Inpatient Medications:  acetaminophen, albuterol, ALPRAZolam, bisacodyl, fentaNYL (SUBLIMAZE) injection, hydrALAZINE, meclizine, ondansetron (ZOFRAN) IV  Miscellaneous: N/A   Impression:  Distal transverse colon mass most likely, noted on CT. 2.   Partial colon obstruction secondary to above. 3.   Anticoagulation therapy - Coumadin reportedly held in preparation for surgery.     Plan:   Agree with and appreciate surgical consult and recommendations. I explained my trepidation with pursuing colonoscopy in the face of partial colon obstruction considering a large volume gut lavage would be relatively contraindicated . In addition, colonoscopy with air insufflation would be relatively contraindicated as well.  In the latter scenario, there would be an increased likelihood of proximal colon distension. This could lead to increased barotrauma and resultant colon perforation from air trapping. Laparotomy appears to be the only solution at this time to alleviate obstruction and to ensure it does not recur in the near future.  Opinion well received by family. All questions were answered. Defer further evaluation and therapy to surgical team. I remain available to help the team as deemed necessary.  Thank you for allowing [place] to be involved in this nice lady's care.  Jakala Herford K. Alice Reichert, M.D. 04/03/2021, 3:07 PM

## 2021-04-03 NOTE — Progress Notes (Signed)
[place] at [place] NAME: Janeice Welton    MR#:  [id number]  DATE OF BIRTH:  [birth year]  SUBJECTIVE:  patient came in with abdominal pain and speech loss for few months. Workup in the ER showed large colonic mass. Had some runny stools after she took stool softeners at home. No blood in stool. Daughter at bedside. Denies any vomiting  patient has several runny bowel movements yesterday. Surgery is on hold for now. REVIEW OF SYSTEMS:   Review of Systems  Constitutional:  Negative for chills, fever and weight loss.  HENT:  Negative for ear discharge, ear pain and nosebleeds.   Eyes:  Negative for blurred vision, pain and discharge.  Respiratory:  Negative for sputum production, shortness of breath, wheezing and stridor.   Cardiovascular:  Negative for chest pain, palpitations, orthopnea and PND.  Gastrointestinal:  Positive for abdominal pain. Negative for diarrhea, nausea and vomiting.  Genitourinary:  Negative for frequency and urgency.  Musculoskeletal:  Negative for back pain and joint pain.  Neurological:  Negative for sensory change, speech change, focal weakness and weakness.  Psychiatric/Behavioral:  Negative for depression and hallucinations. The patient is not nervous/anxious.   Tolerating Diet:npo Tolerating PT:   DRUG ALLERGIES:   Allergies  Allergen Reactions  . Celebrex [Celecoxib] Anaphylaxis  . Morphine And Related Swelling    Lips and mouth  . Povidone-Iodine Other (See Comments)    Other reaction(s): ITCHING Other reaction(s): ITCHING   . Sulfa Antibiotics     VITALS:  Blood pressure 135/70, pulse (!) 53, temperature 98.8 F (37.1 C), temperature source Oral, resp. rate 17, height '5\' 3"'$  (1.6 m), weight 64.9 kg, SpO2 97 %.  PHYSICAL EXAMINATION:   Physical Exam  GENERAL:  87 y.o.-year-old patient lying in the bed with no acute distress.  LUNGS: Normal breath sounds bilaterally, no wheezing, rales, rhonchi. No  use of accessory muscles of respiration.  CARDIOVASCULAR: S1, S2 normal. No murmurs, rubs, or gallops.  ABDOMEN: Soft, nontender, + distended. Bowel sounds present. No organomegaly or mass.  EXTREMITIES: No cyanosis, clubbing or edema b/l.    NEUROLOGIC: non focal PSYCHIATRIC:  patient is alert and oriented x 3.  SKIN: No obvious rash, lesion, or ulcer.   LABORATORY PANEL:  CBC Recent Labs  Lab 04/02/21 0728  WBC 6.0  HGB 9.2*  HCT 28.8*  PLT 291     Chemistries  Recent Labs  Lab 04/01/21 0857 04/02/21 0728  NA 138 137  K 2.7* 3.5  CL 102 104  CO2 25 24  GLUCOSE 129* 100*  BUN 9 8  CREATININE 0.62 0.56  CALCIUM 9.1 8.2*  MG 2.1  --   AST 18  --   ALT 8  --   ALKPHOS 70  --   BILITOT 0.8  --     Cardiac Enzymes No results for input(s): TROPONINI in the last 168 hours. RADIOLOGY:  DG Abd 2 Views  Result Date: 04/03/2021 CLINICAL DATA:  Colonic obstruction EXAM: ABDOMEN - 2 VIEW COMPARISON:  Abdominal x-ray dated April 02, 2021 FINDINGS: No gas-filled dilated loops of bowel. No evidence of pneumatosis. Wall thickening of the descending colon. No acute osseous abnormality. IMPRESSION: No gas-filled dilated loops of bowel. Electronically Signed   By: Yetta Glassman M.D.   On: 04/03/2021 09:41   DG Abd 2 Views  Result Date: 04/02/2021 CLINICAL DATA:  87 year old female with abdominal pain. Abnormal transverse colon on CT yesterday. EXAM: ABDOMEN - 2  VIEW COMPARISON:  CT Abdomen and Pelvis 04/01/2021. FINDINGS: Upright and supine views of the abdomen and pelvis. Air-fluid level in the right colon and/or hepatic flexure on the upright view. No pneumoperitoneum. Stable cardiomegaly and increased right lung base opacity which reflects the known hiatal hernia. Overall nonobstructed bowel-gas pattern elsewhere similar to that on the CT yesterday, no dilated small bowel. Excreted IV contrast in the bladder. Pelvic phleboliths. Stable visualized osseous structures. IMPRESSION:  1. Stable bowel-gas pattern to the CT yesterday, aside from new air-fluid level in the right colon now. As before there is no small bowel dilatation. No free air. 2. Cardiomegaly.  Hiatal hernia. Electronically Signed   By: Genevie Ann M.D.   On: 04/02/2021 07:38   DG BE (COLON)W SINGLE CM (SOL OR THIN BA)  Result Date: 04/03/2021 CLINICAL DATA:  Suspected transverse colon mass identified by CT EXAM: DOUBLE CONTRAST BARIUM ENEMA TECHNIQUE: Initial scout AP supine abdominal image obtained to insure adequate colon cleansing. Barium was introduced into the colon in a retrograde fashion and refluxed from the rectum to the distal transverse colon. As much of the barium as possible was then removed through the indwelling tube via gravity drain. Air was then insufflated into the colon. Spot images of the colon followed by overhead radiographs were obtained. FLUOROSCOPY TIME:  Fluoroscopy Time:  1:30 Number of Acquired Spot Images: 6 COMPARISON:  CT abdomen pelvis, 04/01/2021 FINDINGS: There is ready opacification of the rectum, sigmoid, descending colon, and distal transverse colon to the level of a tight narrowing of the distal transverse colon, as seen by prior CT. There is no transit of contrast proximal to this point, including with air insufflation. Diverticulosis of the descending and sigmoid colon. IMPRESSION: 1. Successful opacification of the colon to the level of a tight narrowing of the distal transverse colon, in keeping with mass suspected by prior CT. No transit of contrast proximal to this point. This remains highly suspicious for obstipating transverse colon malignancy. 2.  Descending and sigmoid diverticulosis. Electronically Signed   By: Eddie Candle M.D.   On: 04/03/2021 15:49   ASSESSMENT AND PLAN:  ANGELES ADORNETTO is a 87 y.o. female with medical history significant of hypertension, stroke, TIA, GERD, hypothyroidism, depression with anxiety, hard of hearing, hiatal hernia, atrial fibrillation on  Coumadin, breast cancer, bladder cancer, anemia, who presents with abdominal pain and constipation.   Abdominal pain/Large bowel obstruction (partial) due to Colon mass worrisome for malignancy -- CT scan findings are concerning for thickening of the distal transverse colon with proximal dilation. Cannot r/o obstructing malignancy.  --Dr. Alice Reichert of GI is consulted--> recommend Laxative regimen and not recommend colonoscopy.  --Surgery consult with Dr. Christian Mate --> for colectomy and ?colostomy on 9/9.  -clear diet now, and NPO at midnight on Thursday  -prn Zofran for nausea and fentanyl for pain -IV fluid: 75 cc/h for normal CD --9/9-- since patient was having several loose bowel movements surgery was held in case patient would better benefit from colonoscopy how were patient was evaluated by Dr. Alice Reichert and he recommends surgery. Colonoscopy is relative contraindicated in this case. -- Will await final decision by surgery.   Hypertension -IV hydralazine as needed -Amlodipine, Cardizem   Hypothyroidism -Synthroid   Stroke [facility]): Patient is on Coumadin for A. fib -Hold Coumadin --INR 2.5--hold IV heparin bridge for now--will likely need it post-op if INR is less than two --d/w pt's dter, and surgery --9/9-- INR 2.5   Hypokalemia: Potassium 2.7. repleted  Depression with anxiety -Continue home medications   Atrial fibrillation, chronic (HCC) -Hold Coumadin -Cardizem --cardiology consult for pre-op clearance with dr Clayborn Bigness   Normocytic anemia: Hemoglobin stable, 10.3 today, 0.8 on 12/09/2019 -Follow-up with CBC   DVT ppx: SCD Code Status: DNR   Family Communication:  Yes, patient's daughger at bed side Disposition Plan:  Anticipate discharge back to previous environment Consults called:  Dr. Alice Reichert of GI and Dr. Christian Mate  Admission status and Level of care: Med-Surg:    Med-surg bed for obs as inpt        Status is: Inpatient   Remains inpatient appropriate  because:Inpatient level of care appropriate due to severity of illness   Dispo: The patient is from: Home              Anticipated d/c is to:  to be determined              Patient currently is not medically stable to d/c.              Difficult to place patient No    TOTAL TIME TAKING CARE OF THIS PATIENT: 25 minutes.  >50% time spent on counselling and coordination of care  Note: This dictation was prepared with Dragon dictation along with smaller phrase technology. Any transcriptional errors that result from this process are unintentional.  Fritzi Mandes M.D    [facility]   CC: Primary care physician; [name], MD Patient ID: Imogene Burn, female   DOB: [birth year], 87 y.o.   MRN: [id number]

## 2021-04-03 NOTE — Care Management Important Message (Signed)
Important Message  Patient Details  Name: ETHAN CEDERQUIST MRN: [id number] Date of Birth: 1931-11-30   Medicare Important Message Given:  Yes     [name] 04/03/2021, 12:56 PM

## 2021-04-03 NOTE — Progress Notes (Signed)
Edison Simon, PA ordered for patient to be started on a clear liquid diet.

## 2021-04-03 NOTE — Progress Notes (Signed)
Mobility Specialist - Progress Note   04/03/21 1659  Mobility  Activity Ambulated in hall  Level of Assistance Independent  Assistive Device None  Distance Ambulated (ft) 180 ft  Mobility Ambulated independently in hallway  Mobility Response Tolerated well  Mobility performed by Mobility specialist  $Mobility charge 1 Mobility    Pt ambulated in hallway independently. No LOB. HR ranging 70-118 bpm with A-fib pattern. No complaints. Family at bedside   Kathee Delton Mobility Specialist 04/03/21, 5:00 PM

## 2021-04-04 DIAGNOSIS — F418 Other specified anxiety disorders: Secondary | ICD-10-CM | POA: Diagnosis not present

## 2021-04-04 DIAGNOSIS — I10 Essential (primary) hypertension: Secondary | ICD-10-CM | POA: Diagnosis not present

## 2021-04-04 DIAGNOSIS — I482 Chronic atrial fibrillation, unspecified: Secondary | ICD-10-CM | POA: Diagnosis not present

## 2021-04-04 DIAGNOSIS — K56609 Unspecified intestinal obstruction, unspecified as to partial versus complete obstruction: Secondary | ICD-10-CM | POA: Diagnosis not present

## 2021-04-04 LAB — PROTIME-INR
INR: 2.2 — ABNORMAL HIGH (ref 0.8–1.2)
Prothrombin Time: 24 seconds — ABNORMAL HIGH (ref 11.4–15.2)

## 2021-04-04 LAB — GLUCOSE, CAPILLARY: Glucose-Capillary: 87 mg/dL (ref 70–99)

## 2021-04-04 LAB — MRSA NEXT GEN BY PCR, NASAL: MRSA by PCR Next Gen: NOT DETECTED

## 2021-04-04 NOTE — Progress Notes (Signed)
[place] at [place] NAME: Neveyah Theilen    MR#:  [id number]  DATE OF BIRTH:  [birth year]  SUBJECTIVE:  patient came in with abdominal pain and speech loss for few months. Workup in the ER showed large colonic mass. Had some runny stools after she took stool softeners at home. No blood in stool. Daughter at bedside. Denies any vomiting  REVIEW OF SYSTEMS:   Review of Systems  Constitutional:  Negative for chills, fever and weight loss.  HENT:  Negative for ear discharge, ear pain and nosebleeds.   Eyes:  Negative for blurred vision, pain and discharge.  Respiratory:  Negative for sputum production, shortness of breath, wheezing and stridor.   Cardiovascular:  Negative for chest pain, palpitations, orthopnea and PND.  Gastrointestinal:  Positive for abdominal pain. Negative for diarrhea, nausea and vomiting.  Genitourinary:  Negative for frequency and urgency.  Musculoskeletal:  Negative for back pain and joint pain.  Neurological:  Negative for sensory change, speech change, focal weakness and weakness.  Psychiatric/Behavioral:  Negative for depression and hallucinations. The patient is not nervous/anxious.   Tolerating Diet:FLD Tolerating PT: ambulatory  DRUG ALLERGIES:   Allergies  Allergen Reactions  . Celebrex [Celecoxib] Anaphylaxis  . Morphine And Related Swelling    Lips and mouth  . Povidone-Iodine Other (See Comments)    Other reaction(s): ITCHING Other reaction(s): ITCHING   . Sulfa Antibiotics     VITALS:  Blood pressure 125/85, pulse 64, temperature 98.3 F (36.8 C), resp. rate 17, height '5\' 3"'$  (1.6 m), weight 68.4 kg, SpO2 98 %.  PHYSICAL EXAMINATION:   Physical Exam  GENERAL:  87 y.o.-year-old patient lying in the bed with no acute distress.  LUNGS: Normal breath sounds bilaterally, no wheezing, rales, rhonchi. No use of accessory muscles of respiration.  CARDIOVASCULAR: S1, S2 normal. No murmurs, rubs, or  gallops.  ABDOMEN: Soft, nontender, + distended. Bowel sounds present. No organomegaly or mass.  EXTREMITIES: No cyanosis, clubbing or edema b/l.    NEUROLOGIC: non focal PSYCHIATRIC:  patient is alert and oriented x 3.  SKIN: No obvious rash, lesion, or ulcer.   LABORATORY PANEL:  CBC Recent Labs  Lab 04/02/21 0728  WBC 6.0  HGB 9.2*  HCT 28.8*  PLT 291     Chemistries  Recent Labs  Lab 04/01/21 0857 04/02/21 0728  NA 138 137  K 2.7* 3.5  CL 102 104  CO2 25 24  GLUCOSE 129* 100*  BUN 9 8  CREATININE 0.62 0.56  CALCIUM 9.1 8.2*  MG 2.1  --   AST 18  --   ALT 8  --   ALKPHOS 70  --   BILITOT 0.8  --     Cardiac Enzymes No results for input(s): TROPONINI in the last 168 hours. RADIOLOGY:  DG Abd 2 Views  Result Date: 04/03/2021 CLINICAL DATA:  Colonic obstruction EXAM: ABDOMEN - 2 VIEW COMPARISON:  Abdominal x-ray dated April 02, 2021 FINDINGS: No gas-filled dilated loops of bowel. No evidence of pneumatosis. Wall thickening of the descending colon. No acute osseous abnormality. IMPRESSION: No gas-filled dilated loops of bowel. Electronically Signed   By: Yetta Glassman M.D.   On: 04/03/2021 09:41   DG BE (COLON)W SINGLE CM (SOL OR THIN BA)  Result Date: 04/03/2021 CLINICAL DATA:  Suspected transverse colon mass identified by CT EXAM: DOUBLE CONTRAST BARIUM ENEMA TECHNIQUE: Initial scout AP supine abdominal image obtained to insure adequate colon cleansing. Barium was  introduced into the colon in a retrograde fashion and refluxed from the rectum to the distal transverse colon. As much of the barium as possible was then removed through the indwelling tube via gravity drain. Air was then insufflated into the colon. Spot images of the colon followed by overhead radiographs were obtained. FLUOROSCOPY TIME:  Fluoroscopy Time:  1:30 Number of Acquired Spot Images: 6 COMPARISON:  CT abdomen pelvis, 04/01/2021 FINDINGS: There is ready opacification of the rectum, sigmoid,  descending colon, and distal transverse colon to the level of a tight narrowing of the distal transverse colon, as seen by prior CT. There is no transit of contrast proximal to this point, including with air insufflation. Diverticulosis of the descending and sigmoid colon. IMPRESSION: 1. Successful opacification of the colon to the level of a tight narrowing of the distal transverse colon, in keeping with mass suspected by prior CT. No transit of contrast proximal to this point. This remains highly suspicious for obstipating transverse colon malignancy. 2.  Descending and sigmoid diverticulosis. Electronically Signed   By: Eddie Candle M.D.   On: 04/03/2021 15:49   ASSESSMENT AND PLAN:  ADVIKA FOXX is a 87 y.o. female with medical history significant of hypertension, stroke, TIA, GERD, hypothyroidism, depression with anxiety, hard of hearing, hiatal hernia, atrial fibrillation on Coumadin, breast cancer, bladder cancer, anemia, who presents with abdominal pain and constipation.   Abdominal pain/Large bowel obstruction (partial) due to Colon mass worrisome for malignancy -- CT scan findings are concerning for thickening of the distal transverse colon with proximal dilation. Cannot r/o obstructing malignancy.  --Dr. Alice Reichert of GI is consulted--> recommend Laxative regimen and not recommend colonoscopy.  --Surgery consult with Dr. Christian Mate --> for colectomy and ?colostomy on 9/9.  -clear diet now, and NPO at midnight on Thursday  -prn Zofran for nausea and fentanyl for pain -IV fluid: 75 cc/h for normal CD --9/9-- since patient was having several loose bowel movements surgery was held in case patient would better benefit from colonoscopy how were patient was evaluated by Dr. Alice Reichert and he recommends surgery. Colonoscopy is relative contraindicated in this case. -- 9/10--Barium enema--showed narrow transition in distal transverse colon. Surgery planned for Monday   Hypertension -IV hydralazine as  needed -Amlodipine, Cardizem   Hypothyroidism -Synthroid   Stroke [facility]): Patient is on Coumadin for A. fib -Hold Coumadin --INR 2.5--hold IV heparin bridge for now--will likely need it post-op if INR is less than two --d/w pt's dter, and surgery --9/9-- INR 2.5 --9/10--INR 2.2   Hypokalemia: Potassium 2.7. repleted   Depression with anxiety -Continue home medications   Atrial fibrillation, chronic (HCC) -Hold Coumadin -Cardizem --cardiology consult for pre-op clearance with dr Clayborn Bigness   Normocytic anemia: Hemoglobin stable, 10.3 today, 0.8 on 12/09/2019 -Follow-up with CBC   DVT ppx: SCD Code Status: DNR   Family Communication:  Yes, patient's daughger at bed side Disposition Plan:  Anticipate discharge back to previous environment Consults called:  Dr. Alice Reichert of GI and Dr. Christian Mate  Admission status and Level of care: Med-Surg:    Med-surg bed for obs as inpt        Status is: Inpatient   Remains inpatient appropriate because:Inpatient level of care appropriate due to severity of illness   Dispo: The patient is from: Home              Anticipated d/c is to:  to be determined              Patient currently  is not medically stable to d/c.              Difficult to place patient No    TOTAL TIME TAKING CARE OF THIS PATIENT: 25 minutes.  >50% time spent on counselling and coordination of care  Note: This dictation was prepared with Dragon dictation along with smaller phrase technology. Any transcriptional errors that result from this process are unintentional.  Fritzi Mandes M.D    [facility]   CC: Primary care physician; [name], MD Patient ID: Imogene Burn, female   DOB: 1932-05-15, 87 y.o.   MRN: [id number]

## 2021-04-04 NOTE — Plan of Care (Signed)
Continuing with plan of care. 

## 2021-04-04 NOTE — Progress Notes (Signed)
Mobility Specialist - Progress Note   04/04/21 1400  Mobility  Activity Transferred to/from [facility]  Level of Assistance Independent  Assistive Device None  Distance Ambulated (ft) 4 ft  Mobility Out of bed for toileting  Mobility Response Tolerated well  Mobility performed by Mobility specialist  $Mobility charge 1 Mobility    Pt ambulated to [facility] for BM.    Kathee Delton Mobility Specialist 04/04/21, 2:44 PM

## 2021-04-04 NOTE — Progress Notes (Signed)
[facility] Cardiology    SUBJECTIVE: Patient states he feels much better today less abdominal pain is actually eating liquids without difficulty   Vitals:   04/03/21 1626 04/03/21 1928 04/04/21 0432 04/04/21 0809  BP: (!) 158/78 (!) 149/82 (!) 146/59 125/85  Pulse: 62 69 69 64  Resp: '17 16 16 17  '$ Temp: 98.9 F (37.2 C) 98.3 F (36.8 C) 98 F (36.7 C) 98.3 F (36.8 C)  TempSrc: Oral Oral Oral   SpO2: 95% 99% 99% 98%  Weight:   68.4 kg   Height:         Intake/Output Summary (Last 24 hours) at 04/04/2021 [id number] Last data filed at 04/04/2021 0256 Gross per 24 hour  Intake 2004.83 ml  Output --  Net 2004.83 ml      PHYSICAL EXAM  General: Well developed, well nourished, in no acute distress HEENT:  Normocephalic and atramatic Neck:  No JVD.  Lungs: Clear bilaterally to auscultation and percussion. Heart: HRRR . Normal S1 and S2 without gallops or murmurs.  Abdomen: Bowel sounds are positive, abdomen soft and non-tender  Msk:  Back normal, normal gait. Normal strength and tone for age. Extremities: No clubbing, cyanosis or edema.   Neuro: Alert and oriented X 3. Psych:  Good affect, responds appropriately   LABS: Basic Metabolic Panel: Recent Labs    04/02/21 0728  NA 137  K 3.5  CL 104  CO2 24  GLUCOSE 100*  BUN 8  CREATININE 0.56  CALCIUM 8.2*   Liver Function Tests: No results for input(s): AST, ALT, ALKPHOS, BILITOT, PROT, ALBUMIN in the last 72 hours. No results for input(s): LIPASE, AMYLASE in the last 72 hours. CBC: Recent Labs    04/02/21 0728  WBC 6.0  HGB 9.2*  HCT 28.8*  MCV 82.3  PLT 291   Cardiac Enzymes: No results for input(s): CKTOTAL, CKMB, CKMBINDEX, TROPONINI in the last 72 hours. BNP: Invalid input(s): POCBNP D-Dimer: No results for input(s): DDIMER in the last 72 hours. Hemoglobin A1C: No results for input(s): HGBA1C in the last 72 hours. Fasting Lipid Panel: No results for input(s): CHOL, HDL, LDLCALC, TRIG, CHOLHDL, LDLDIRECT  in the last 72 hours. Thyroid Function Tests: No results for input(s): TSH, T4TOTAL, T3FREE, THYROIDAB in the last 72 hours.  Invalid input(s): FREET3 Anemia Panel: No results for input(s): VITAMINB12, FOLATE, FERRITIN, TIBC, IRON, RETICCTPCT in the last 72 hours.  DG Abd 2 Views  Result Date: 04/03/2021 CLINICAL DATA:  Colonic obstruction EXAM: ABDOMEN - 2 VIEW COMPARISON:  Abdominal x-ray dated April 02, 2021 FINDINGS: No gas-filled dilated loops of bowel. No evidence of pneumatosis. Wall thickening of the descending colon. No acute osseous abnormality. IMPRESSION: No gas-filled dilated loops of bowel. Electronically Signed   By: Yetta Glassman M.D.   On: 04/03/2021 09:41   DG BE (COLON)W SINGLE CM (SOL OR THIN BA)  Result Date: 04/03/2021 CLINICAL DATA:  Suspected transverse colon mass identified by CT EXAM: DOUBLE CONTRAST BARIUM ENEMA TECHNIQUE: Initial scout AP supine abdominal image obtained to insure adequate colon cleansing. Barium was introduced into the colon in a retrograde fashion and refluxed from the rectum to the distal transverse colon. As much of the barium as possible was then removed through the indwelling tube via gravity drain. Air was then insufflated into the colon. Spot images of the colon followed by overhead radiographs were obtained. FLUOROSCOPY TIME:  Fluoroscopy Time:  1:30 Number of Acquired Spot Images: 6 COMPARISON:  CT abdomen pelvis, 04/01/2021 FINDINGS: There is  ready opacification of the rectum, sigmoid, descending colon, and distal transverse colon to the level of a tight narrowing of the distal transverse colon, as seen by prior CT. There is no transit of contrast proximal to this point, including with air insufflation. Diverticulosis of the descending and sigmoid colon. IMPRESSION: 1. Successful opacification of the colon to the level of a tight narrowing of the distal transverse colon, in keeping with mass suspected by prior CT. No transit of contrast  proximal to this point. This remains highly suspicious for obstipating transverse colon malignancy. 2.  Descending and sigmoid diverticulosis. Electronically Signed   By: Eddie Candle M.D.   On: 04/03/2021 15:49     Echo preserved left ventricular function  TELEMETRY: Atrial fibrillation nonspecific ST-T changes:  ASSESSMENT AND PLAN:  Principal Problem:   Abdominal pain Active Problems:   Hypertension   Hypothyroidism   Stroke ([place])   Hypokalemia   Depression with anxiety   Atrial fibrillation, chronic (HCC)   Normocytic anemia   Plan Continue to hold Coumadin follow-up INR INR today was 2.2 once he gets to 2.0 or below would recommend starting heparin as a drip without the bolus based on her weight Follow-up PTTs once on heparin Atrial fibrillation appears to be reasonably controlled Continue blood pressure management and control Correct electrolytes Recommend conservative cardiac input at this point   Yolonda Kida, MD 04/04/2021 9:11 AM

## 2021-04-04 NOTE — Progress Notes (Addendum)
Subjective:  CC: Gabriela [name] is a 87 y.o. female  Hospital stay day 3,  colon obstruction  HPI: No acute issues overnight.  Still tolerating clears and reports another BM.  ROS:  General: Denies weight loss, weight gain, fatigue, fevers, chills, and night sweats. Heart: Denies chest pain, palpitations, racing heart, irregular heartbeat, leg pain or swelling, and decreased activity tolerance. Respiratory: Denies breathing difficulty, shortness of breath, wheezing, cough, and sputum. GI: Denies change in appetite, heartburn, nausea, vomiting, constipation, diarrhea, and blood in stool. GU: Denies difficulty urinating, pain with urinating, urgency, frequency, blood in urine.   Objective:   Temp:  [98 F (36.7 C)-98.9 F (37.2 C)] 98.3 F (36.8 C) (09/10 0809) Pulse Rate:  [62-69] 64 (09/10 0809) Resp:  [16-17] 17 (09/10 0809) BP: (125-158)/(59-85) 125/85 (09/10 0809) SpO2:  [95 %-99 %] 98 % (09/10 0809) Weight:  [68.4 kg] 68.4 kg (09/10 0432)     Height: '5\' 3"'$  (160 cm) Weight: 68.4 kg BMI (Calculated): 26.72   Intake/Output this shift:   Intake/Output Summary (Last 24 hours) at 04/04/2021 1007 Last data filed at 04/04/2021 0256 Gross per 24 hour  Intake 2004.83 ml  Output --  Net 2004.83 ml    Constitutional :  alert, cooperative, appears stated age, and no distress  Respiratory:  clear to auscultation bilaterally  Cardiovascular:  irregularly irregular rhythm  Gastrointestinal: soft, non-tender; bowel sounds normal; no masses,  no organomegaly.   Skin: Cool and moist.  Psychiatric: Normal affect, non-agitated, not confused       LABS:  CMP Latest Ref Rng & Units 04/02/2021 04/01/2021 07/10/2020  Glucose 70 - 99 mg/dL 100(H) 129(H) 124(H)  BUN 8 - 23 mg/dL '8 9 14  '$ Creatinine 0.44 - 1.00 mg/dL 0.56 0.62 0.68  Sodium 135 - 145 mmol/L 137 138 141  Potassium 3.5 - 5.1 mmol/L 3.5 2.7(LL) 3.7  Chloride 98 - 111 mmol/L 104 102 106  CO2 22 - 32 mmol/L '24 25 25  '$ Calcium 8.9 -  10.3 mg/dL 8.2(L) 9.1 8.9  Total Protein 6.5 - 8.1 g/dL - 7.3 7.5  Total Bilirubin 0.3 - 1.2 mg/dL - 0.8 0.6  Alkaline Phos 38 - 126 U/L - 70 56  AST 15 - 41 U/L - 18 23  ALT 0 - 44 U/L - 8 10   CBC Latest Ref Rng & Units 04/02/2021 04/01/2021 07/10/2020  WBC 4.0 - 10.5 K/uL 6.0 7.4 4.8  Hemoglobin 12.0 - 15.0 g/dL 9.2(L) 10.3(L) 8.0(L)  Hematocrit 36.0 - 46.0 % 28.8(L) 31.1(L) 28.3(L)  Platelets 150 - 400 K/uL 291 334 344    RADS: CLINICAL DATA:  Suspected transverse colon mass identified by CT   EXAM: DOUBLE CONTRAST BARIUM ENEMA   TECHNIQUE: Initial scout AP supine abdominal image obtained to insure adequate colon cleansing. Barium was introduced into the colon in a retrograde fashion and refluxed from the rectum to the distal transverse colon. As much of the barium as possible was then removed through the indwelling tube via gravity drain. Air was then insufflated into the colon. Spot images of the colon followed by overhead radiographs were obtained.   FLUOROSCOPY TIME:  Fluoroscopy Time:  1:30   Number of Acquired Spot Images: 6   COMPARISON:  CT abdomen pelvis, 04/01/2021   FINDINGS: There is ready opacification of the rectum, sigmoid, descending colon, and distal transverse colon to the level of a tight narrowing of the distal transverse colon, as seen by prior CT. There is no transit of  contrast proximal to this point, including with air insufflation. Diverticulosis of the descending and sigmoid colon.   IMPRESSION: 1. Successful opacification of the colon to the level of a tight narrowing of the distal transverse colon, in keeping with mass suspected by prior CT. No transit of contrast proximal to this point. This remains highly suspicious for obstipating transverse colon malignancy.   2.  Descending and sigmoid diverticulosis.     Electronically Signed   By: Eddie Candle M.D.   On: 04/03/2021 15:49 Assessment:   Colon obstruction secondary to likely  mass.  Pt clinically continues to do well, tolerating clears.  Even though image above did not show obvious contrast flowing through stricture, will try advancing to full liquids today for patient comfort.  Will switch back to clears tomorrow in preparation for possible surgery on Monday.  Will continue to monitor.  Start heparin drip per cards recs.  Monitor INR

## 2021-04-05 DIAGNOSIS — K56609 Unspecified intestinal obstruction, unspecified as to partial versus complete obstruction: Secondary | ICD-10-CM | POA: Diagnosis not present

## 2021-04-05 DIAGNOSIS — F418 Other specified anxiety disorders: Secondary | ICD-10-CM | POA: Diagnosis not present

## 2021-04-05 DIAGNOSIS — I482 Chronic atrial fibrillation, unspecified: Secondary | ICD-10-CM | POA: Diagnosis not present

## 2021-04-05 DIAGNOSIS — I10 Essential (primary) hypertension: Secondary | ICD-10-CM | POA: Diagnosis not present

## 2021-04-05 LAB — CBC
HCT: 30.3 % — ABNORMAL LOW (ref 36.0–46.0)
Hemoglobin: 9.8 g/dL — ABNORMAL LOW (ref 12.0–15.0)
MCH: 26.7 pg (ref 26.0–34.0)
MCHC: 32.3 g/dL (ref 30.0–36.0)
MCV: 82.6 fL (ref 80.0–100.0)
Platelets: 329 10*3/uL (ref 150–400)
RBC: 3.67 MIL/uL — ABNORMAL LOW (ref 3.87–5.11)
RDW: 14.2 % (ref 11.5–15.5)
WBC: 6.2 10*3/uL (ref 4.0–10.5)
nRBC: 0 % (ref 0.0–0.2)

## 2021-04-05 LAB — PROTIME-INR
INR: 1.6 — ABNORMAL HIGH (ref 0.8–1.2)
Prothrombin Time: 19.2 seconds — ABNORMAL HIGH (ref 11.4–15.2)

## 2021-04-05 LAB — HEPARIN LEVEL (UNFRACTIONATED): Heparin Unfractionated: <0.1 IU/mL — ABNORMAL LOW (ref 0.30–0.70)

## 2021-04-05 LAB — APTT: aPTT: 36 seconds (ref 24–36)

## 2021-04-05 MED ORDER — HEPARIN BOLUS VIA INFUSION
2000.0000 [IU] | Freq: Once | INTRAVENOUS | Status: AC
  Administered 2021-04-06: 2000 [IU] via INTRAVENOUS
  Filled 2021-04-05: qty 2000

## 2021-04-05 MED ORDER — HEPARIN (PORCINE) 25000 UT/250ML-% IV SOLN
1150.0000 [IU]/h | INTRAVENOUS | Status: AC
  Administered 2021-04-05: 950 [IU]/h via INTRAVENOUS
  Filled 2021-04-05: qty 250

## 2021-04-05 NOTE — Consult Note (Addendum)
ANTICOAGULATION CONSULT NOTE   Pharmacy Consult for heparin infusion  Indication: atrial fibrillation  Allergies  Allergen Reactions   Celebrex [Celecoxib] Anaphylaxis   Morphine And Related Swelling    Lips and mouth   Povidone-Iodine Other (See Comments)    Other reaction(s): ITCHING Other reaction(s): ITCHING    Sulfa Antibiotics     Patient Measurements: Height: '5\' 3"'$  (160 cm) Weight: 68.2 kg (150 lb 5.7 oz) IBW/kg (Calculated) : 52.4 Heparin Dosing Weight: 64.9  Vital Signs: Temp: 98.2 F (36.8 C) (09/11 1913) Temp Source: Oral (09/11 1913) BP: 144/65 (09/11 1913) Pulse Rate: 66 (09/11 1913)  Labs: Recent Labs    04/03/21 0755 04/04/21 0406 04/05/21 1128 04/05/21 1255 04/05/21 2052  HGB  --   --   --  9.8*  --   HCT  --   --   --  30.3*  --   PLT  --   --   --  329  --   APTT  --   --   --  36  --   LABPROT 26.9* 24.0* 19.2*  --   --   INR 2.5* 2.2* 1.6*  --   --   HEPARINUNFRC  --   --   --   --  <0.10*     Estimated Creatinine Clearance: 45.9 mL/min (by C-G formula based on SCr of 0.56 mg/dL).   Medical History: Past Medical History:  Diagnosis Date   Acid reflux    Anxiety    Arrhythmia    Bladder cancer ([place])    Breast cancer ([place])    Cancer ([place])    breast,bladder   Depression    Dysrhythmia    afib   GERD (gastroesophageal reflux disease)    History of hiatal hernia    HOH (hard of hearing)    aids   Hypertension    Hyperthyroidism    Hypothyroidism    Stroke ([place])    2010   TIA (transient ischemic attack)     Medications:  Scheduled:   amLODipine  5 mg Oral Daily   bisacodyl  10 mg Rectal Once   irbesartan  300 mg Oral Daily   levothyroxine  112 mcg Oral QAC breakfast   multivitamin with minerals  1 tablet Oral Daily   pantoprazole  40 mg Oral Daily   polyethylene glycol  17 g Oral Daily   sertraline  50 mg Oral QHS    Assessment: 87 y.o. female with medical history significant of hypertension, stroke, TIA, GERD,  hypothyroidism, depression with anxiety, hard of hearing, hiatal hernia, atrial fibrillation on Coumadin, breast cancer, bladder cancer, anemia, who presents with abdominal pain and constipation. Pharmacy has been consulted for heparin infusion dosing.   Surgery was consulted for colon obstruction secondary to mass, plans for surgical intervention on 9/12. Pt to be placed on heparin infusion in place of warfarin given planned procedure.   Labs: Baseline CBC was within normal limits. INR trend 2.2>2.5>2.2>1.6.   9/11 APTT and CBC pending   Goal of Therapy:  Heparin level 0.3-0.7 units/ml Monitor platelets by anticoagulation protocol: Yes  9/11 2052 HL <0.1   Plan:  Give 2000 unit bolus x 1 Increase heparin infusion at 1150 units/hr With heparin drip scheduled to be stopped prior to procedure later this morning, will recheck HL with AM labs for reference should heparin be restarted post procedure Continue to monitor H&H and platelets  Renda Rolls, PharmD, [facility] 04/05/2021 11:55 PM

## 2021-04-05 NOTE — Progress Notes (Signed)
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   Vitals:   04/04/21 1531 04/04/21 1914 04/05/21 0555 04/05/21 0600  BP: (!) 148/61 135/65 121/66   Pulse: 68 68 (!) 50   Resp: '18 20 20   '$ Temp: 98.6 F (37 C) 98.4 F (36.9 C) 98.1 F (36.7 C)   TempSrc: Oral Oral Oral   SpO2: 97% 96% 98%   Weight:    68.2 kg  Height:         Intake/Output Summary (Last 24 hours) at 04/05/2021 1222 Last data filed at 04/05/2021 1005 Gross per 24 hour  Intake 1740.85 ml  Output 0 ml  Net 1740.85 ml      PHYSICAL EXAM  General: Well developed, well nourished, in no acute distress HEENT:  Normocephalic and atramatic Neck:  No JVD.  Lungs: Clear bilaterally to auscultation and percussion. Heart: HRRR . Normal S1 and S2 without gallops or murmurs.  Abdomen: Bowel sounds are positive, abdomen soft and non-tender  Msk:  Back normal, normal gait. Normal strength and tone for age. Extremities: No clubbing, cyanosis or edema.   Neuro: Alert and oriented X 3. Psych:  Good affect, responds appropriately   LABS: Basic Metabolic Panel: No results for input(s): NA, K, CL, CO2, GLUCOSE, BUN, CREATININE, CALCIUM, MG, PHOS in the last 72 hours. Liver Function Tests: No results for input(s): AST, ALT, ALKPHOS, BILITOT, PROT, ALBUMIN in the last 72 hours. No results for input(s): LIPASE, AMYLASE in the last 72 hours. CBC: No results for input(s): WBC, NEUTROABS, HGB, HCT, MCV, PLT in the last 72 hours. Cardiac Enzymes: No results for input(s): CKTOTAL, CKMB, CKMBINDEX, TROPONINI in the last 72 hours. BNP: Invalid input(s): POCBNP D-Dimer: No results for input(s): DDIMER in the last 72 hours. Hemoglobin A1C: No results for input(s): HGBA1C in the last 72 hours. Fasting Lipid Panel: No results for input(s): CHOL, HDL, LDLCALC, TRIG,  CHOLHDL, LDLDIRECT in the last 72 hours. Thyroid Function Tests: No results for input(s): TSH, T4TOTAL, T3FREE, THYROIDAB in the last 72 hours.  Invalid input(s): FREET3 Anemia Panel: No results for input(s): VITAMINB12, FOLATE, FERRITIN, TIBC, IRON, RETICCTPCT in the last 72 hours.  DG BE (COLON)W SINGLE CM (SOL OR THIN BA)  Result Date: 04/03/2021 CLINICAL DATA:  Suspected transverse colon mass identified by CT EXAM: DOUBLE CONTRAST BARIUM ENEMA TECHNIQUE: Initial scout AP supine abdominal image obtained to insure adequate colon cleansing. Barium was introduced into the colon in a retrograde fashion and refluxed from the rectum to the distal transverse colon. As much of the barium as possible was then removed through the indwelling tube via gravity drain. Air was then insufflated into the colon. Spot images of the colon followed by overhead radiographs were obtained. FLUOROSCOPY TIME:  Fluoroscopy Time:  1:30 Number of Acquired Spot Images: 6 COMPARISON:  CT abdomen pelvis, 04/01/2021 FINDINGS: There is ready opacification of the rectum, sigmoid, descending colon, and distal transverse colon to the level of a tight narrowing of the distal transverse colon, as seen by prior CT. There is no transit of contrast proximal to this point, including with air insufflation. Diverticulosis of the descending and sigmoid colon. IMPRESSION: 1. Successful opacification of the colon to the level of a tight narrowing of the distal transverse colon, in keeping with mass suspected by prior CT. No transit of contrast proximal to this point.  This remains highly suspicious for obstipating transverse colon malignancy. 2.  Descending and sigmoid diverticulosis. Electronically Signed   By: Eddie Candle M.D.   On: 04/03/2021 15:49     Echo preserved ventricular function  TELEMETRY: Atrial fibrillation nonspecific ST-T changes rate of 70:  ASSESSMENT AND PLAN:  Principal Problem:   Abdominal pain Active Problems:    Hypertension   Hypothyroidism   Stroke ([place])   Hypokalemia   Depression with anxiety   Atrial fibrillation, chronic (HCC)   Normocytic anemia    Plan Preop for abdominal surgery patient appears to be an acceptable surgical risk mild to moderate History of atrial fibrillation on anticoagulation Coumadin has been held INR less than 2 recommend bridging with heparin because of high risk status of previous CVA Continue hypertension management and control Correct electrolytes of hypokalemia Recommend heparin IV weightbase no bolus and follow protocol.  Postop institute Coumadin postop when safe to do so by the surgeons   Yolonda Kida, MD 04/05/2021 12:22 PM

## 2021-04-05 NOTE — Consult Note (Signed)
ANTICOAGULATION CONSULT NOTE - Initial Consult  Pharmacy Consult for heparin infusion  Indication: atrial fibrillation  Allergies  Allergen Reactions   Celebrex [Celecoxib] Anaphylaxis   Morphine And Related Swelling    Lips and mouth   Povidone-Iodine Other (See Comments)    Other reaction(s): ITCHING Other reaction(s): ITCHING    Sulfa Antibiotics     Patient Measurements: Height: '5\' 3"'$  (160 cm) Weight: 68.2 kg (150 lb 5.7 oz) IBW/kg (Calculated) : 52.4 Heparin Dosing Weight: 64.9  Vital Signs: Temp: 98.1 F (36.7 C) (09/11 0555) Temp Source: Oral (09/11 0555) BP: 121/66 (09/11 0555) Pulse Rate: 50 (09/11 0555)  Labs: Recent Labs    04/03/21 0755 04/04/21 0406 04/05/21 1128  LABPROT 26.9* 24.0* 19.2*  INR 2.5* 2.2* 1.6*    Estimated Creatinine Clearance: 45.9 mL/min (by C-G formula based on SCr of 0.56 mg/dL).   Medical History: Past Medical History:  Diagnosis Date   Acid reflux    Anxiety    Arrhythmia    Bladder cancer ([place])    Breast cancer ([place])    Cancer ([place])    breast,bladder   Depression    Dysrhythmia    afib   GERD (gastroesophageal reflux disease)    History of hiatal hernia    HOH (hard of hearing)    aids   Hypertension    Hyperthyroidism    Hypothyroidism    Stroke ([place])    2010   TIA (transient ischemic attack)     Medications:  Scheduled:   amLODipine  5 mg Oral Daily   bisacodyl  10 mg Rectal Once   irbesartan  300 mg Oral Daily   levothyroxine  112 mcg Oral QAC breakfast   multivitamin with minerals  1 tablet Oral Daily   pantoprazole  40 mg Oral Daily   polyethylene glycol  17 g Oral Daily   sertraline  50 mg Oral QHS    Assessment: 87 y.o. female with medical history significant of hypertension, stroke, TIA, GERD, hypothyroidism, depression with anxiety, hard of hearing, hiatal hernia, atrial fibrillation on Coumadin, breast cancer, bladder cancer, anemia, who presents with abdominal pain and constipation.  Pharmacy has been consulted for heparin infusion dosing.   Surgery was consulted for colon obstruction secondary to mass, plans for surgical intervention on 9/12. Pt to be placed on heparin infusion in place of warfarin given planned procedure.   Labs: Baseline CBC was within normal limits. INR trend 2.2>2.5>2.2>1.6.   9/11 APTT and CBC pending   Goal of Therapy:  Heparin level 0.3-0.7 units/ml Monitor platelets by anticoagulation protocol: Yes   Plan:  Per Cardiology rec's (9/11) no bolus initially then titration per nomogram okay thereafter. Will order CBC for today, and baseline aPTT at time of heparin start, but titrate per anti-Xa. Start heparin infusion at 950 units/hr Check anti-Xa level in 8 hours after infusion start; daily once consecutively therapeutic. Continue to monitor H&H and platelets  Narda Rutherford, PharmD Pharmacy Resident  04/05/2021 12:21 PM

## 2021-04-05 NOTE — Progress Notes (Signed)
[name] [place] NAME: Gabriela [name]    MR#:  [place]:[id number]  DATE OF BIRTH:  March 29, 1932  SUBJECTIVE:  patient came in with abdominal pain and speech loss for few months. Workup in the showed large distal transverse colon mass.   Tolerating CLD.  REVIEW OF SYSTEMS:   Review of Systems  Constitutional:  Negative for chills, fever and weight loss.  HENT:  Negative for ear discharge, ear pain and nosebleeds.   Eyes:  Negative for blurred vision, pain and discharge.  Respiratory:  Negative for sputum production, shortness of breath, wheezing and stridor.   Cardiovascular:  Negative for chest pain, palpitations, orthopnea and PND.  Gastrointestinal:  Positive for abdominal pain. Negative for diarrhea, nausea and vomiting.  Genitourinary:  Negative for frequency and urgency.  Musculoskeletal:  Negative for back pain and joint pain.  Neurological:  Negative for sensory change, speech change, focal weakness and weakness.  Psychiatric/Behavioral:  Negative for depression and hallucinations. The patient is not nervous/anxious.   Tolerating Diet:CLD Tolerating PT: ambulatory  DRUG ALLERGIES:   Allergies  Allergen Reactions  . Celebrex [Celecoxib] Anaphylaxis  . Morphine And Related Swelling    Lips and mouth  . Povidone-Iodine Other (See Comments)    Other reaction(s): ITCHING Other reaction(s): ITCHING   . Sulfa Antibiotics     VITALS:  Blood pressure 121/66, pulse (!) 50, temperature 98.1 F (36.7 C), temperature source Oral, resp. rate 20, height '5\' 3"'$  (1.6 m), weight 68.2 kg, SpO2 98 %.  PHYSICAL EXAMINATION:   Physical Exam  GENERAL:  87 y.o.-year-old patient lying in the bed with no acute distress.  LUNGS: Normal breath sounds bilaterally, no wheezing, rales, rhonchi. No use of accessory muscles of respiration.  CARDIOVASCULAR: S1, S2 normal. No murmurs, rubs, or gallops.  ABDOMEN: Soft, nontender, + distended. Bowel sounds  present. No organomegaly or mass.  EXTREMITIES: No cyanosis, clubbing or edema b/l.    NEUROLOGIC: non focal PSYCHIATRIC:  patient is alert and oriented x 3.  SKIN: No obvious rash, lesion, or ulcer.   LABORATORY PANEL:  CBC Recent Labs  Lab 04/02/21 0728  WBC 6.0  HGB 9.2*  HCT 28.8*  PLT 291     Chemistries  Recent Labs  Lab 04/01/21 0857 04/02/21 0728  NA 138 137  K 2.7* 3.5  CL 102 104  CO2 25 24  GLUCOSE 129* 100*  BUN 9 8  CREATININE 0.62 0.56  CALCIUM 9.1 8.2*  MG 2.1  --   AST 18  --   ALT 8  --   ALKPHOS 70  --   BILITOT 0.8  --     Cardiac Enzymes No results for input(s): TROPONINI in the last 168 hours. RADIOLOGY:  DG BE (COLON)W SINGLE CM (SOL OR THIN BA)  Result Date: 04/03/2021 CLINICAL DATA:  Suspected transverse colon mass identified by CT EXAM: DOUBLE CONTRAST BARIUM ENEMA TECHNIQUE: Initial scout AP supine abdominal image obtained to insure adequate colon cleansing. Barium was introduced into the colon in a retrograde fashion and refluxed from the rectum to the distal transverse colon. As much of the barium as possible was then removed through the indwelling tube via gravity drain. Air was then insufflated into the colon. Spot images of the colon followed by overhead radiographs were obtained. FLUOROSCOPY TIME:  Fluoroscopy Time:  1:30 Number of Acquired Spot Images: 6 COMPARISON:  CT abdomen pelvis, 04/01/2021 FINDINGS: There is ready opacification of the rectum, sigmoid, descending  colon, and distal transverse colon to the level of a tight narrowing of the distal transverse colon, as seen by prior CT. There is no transit of contrast proximal to this point, including with air insufflation. Diverticulosis of the descending and sigmoid colon. IMPRESSION: 1. Successful opacification of the colon to the level of a tight narrowing of the distal transverse colon, in keeping with mass suspected by prior CT. No transit of contrast proximal to this point. This  remains highly suspicious for obstipating transverse colon malignancy. 2.  Descending and sigmoid diverticulosis. Electronically Signed   By: Eddie Candle M.D.   On: 04/03/2021 15:49   ASSESSMENT AND PLAN:  Gabriela [name] is a 87 y.o. female with medical history significant of hypertension, stroke, TIA, GERD, hypothyroidism, depression with anxiety, hard of hearing, hiatal hernia, atrial fibrillation on Coumadin, breast cancer, bladder cancer, anemia, who presents with abdominal pain and constipation.   Abdominal pain/Large bowel obstruction (partial) due to Colon mass worrisome for malignancy -- CT scan findings are concerning for thickening of the distal transverse colon with proximal dilation. Cannot r/o obstructing malignancy.  --Dr. Alice Reichert of GI is consulted--> recommend Laxative regimen and not recommend colonoscopy.  --Surgery consult with Dr. Christian Mate --> for colectomy and ?colostomy on 9/9.  -clear diet now, and NPO at midnight on Thursday  -prn Zofran for nausea and fentanyl for pain -IV fluid: 75 cc/h for normal CD --9/9-- since patient was having several loose bowel movements surgery was held in case patient would better benefit from colonoscopy how were patient was evaluated by Dr. Alice Reichert and he recommends surgery. Colonoscopy is relative contraindicated in this case. -- 9/10--Barium enema--showed narrow transition in distal transverse colon. Surgery planned for Monday --9/11-- check INR. If <2.0 will start heparin gtt pre-operatively. D/w dr Lysle Pearl   Hypertension -IV hydralazine as needed -Amlodipine, Cardizem   Hypothyroidism -Synthroid   Stroke [facility]): Patient is on Coumadin for A. fib -Hold Coumadin --INR 2.5--hold IV heparin bridge for now--will likely need it post-op if INR is less than two --d/w pt's dter, and surgery --9/9-- INR 2.5 --9/10--INR 2.2 --9/11--INR pending   Hypokalemia: Potassium 2.7. repleted   Depression with anxiety -Continue home medications    Atrial fibrillation, chronic (HCC) -Hold Coumadin -Cardizem --cardiology consult for pre-op clearance with dr Clayborn Bigness   Normocytic anemia: Hemoglobin stable, 10.3 today, 0.8 on 12/09/2019 -Follow-up with CBC   DVT ppx: SCD Code Status: DNR   Family Communication: none today Disposition Plan:  Anticipate discharge back to previous environment Consults called:  Dr. Alice Reichert of GI and Dr. Christian Mate  Admission status and Level of care: Med-Surg:          Status is: Inpatient   Remains inpatient appropriate because:Inpatient level of care appropriate due to severity of illness   Dispo: The patient is from: Home              Anticipated d/c is to:  to be determined              Patient currently is not medically stable to d/c.              Difficult to place patient No   Planned to colon surgery in am  TOTAL TIME TAKING CARE OF THIS PATIENT: 25 minutes.  >50% time spent on counselling and coordination of care  Note: This dictation was prepared with Dragon dictation along with smaller phrase technology. Any transcriptional errors that result from this process are unintentional.  Fritzi Mandes  M.D    [facility]   CC: Primary care physician; [name], MD Patient ID: Imogene Burn, female   DOB: Jun 24, 1932, 87 y.o.   MRN: [id number]

## 2021-04-05 NOTE — Progress Notes (Addendum)
Subjective:  CC: Gabriela [name] is a 87 y.o. female  Hospital stay day 4,  colon obstruction  HPI: No acute issues overnight.  Mult Bms since last exam   ROS:  General: Denies weight loss, weight gain, fatigue, fevers, chills, and night sweats. Heart: Denies chest pain, palpitations, racing heart, irregular heartbeat, leg pain or swelling, and decreased activity tolerance. Respiratory: Denies breathing difficulty, shortness of breath, wheezing, cough, and sputum. GI: Denies change in appetite, heartburn, nausea, vomiting, constipation, diarrhea, and blood in stool. GU: Denies difficulty urinating, pain with urinating, urgency, frequency, blood in urine.   Objective:   Temp:  [98.1 F (36.7 C)-98.6 F (37 C)] 98.1 F (36.7 C) (09/11 0555) Pulse Rate:  [50-68] 50 (09/11 0555) Resp:  [18-20] 20 (09/11 0555) BP: (121-148)/(61-66) 121/66 (09/11 0555) SpO2:  [96 %-98 %] 98 % (09/11 0555) Weight:  [68.2 kg] 68.2 kg (09/11 0600)     Height: '5\' 3"'$  (160 cm) Weight: 68.2 kg BMI (Calculated): 26.64   Intake/Output this shift:   Intake/Output Summary (Last 24 hours) at 04/05/2021 1054 Last data filed at 04/05/2021 1005 Gross per 24 hour  Intake 1740.85 ml  Output 0 ml  Net 1740.85 ml    Constitutional :  alert, cooperative, appears stated age, and no distress  Respiratory:  clear to auscultation bilaterally  Cardiovascular:  irregularly irregular rhythm  Gastrointestinal: soft, non-tender; bowel sounds normal; no masses,  no organomegaly.   Skin: Cool and moist.  Psychiatric: Normal affect, non-agitated, not confused       LABS:  CMP Latest Ref Rng & Units 04/02/2021 04/01/2021 07/10/2020  Glucose 70 - 99 mg/dL 100(H) 129(H) 124(H)  BUN 8 - 23 mg/dL '8 9 14  '$ Creatinine 0.44 - 1.00 mg/dL 0.56 0.62 0.68  Sodium 135 - 145 mmol/L 137 138 141  Potassium 3.5 - 5.1 mmol/L 3.5 2.7(LL) 3.7  Chloride 98 - 111 mmol/L 104 102 106  CO2 22 - 32 mmol/L '24 25 25  '$ Calcium 8.9 - 10.3 mg/dL 8.2(L)  9.1 8.9  Total Protein 6.5 - 8.1 g/dL - 7.3 7.5  Total Bilirubin 0.3 - 1.2 mg/dL - 0.8 0.6  Alkaline Phos 38 - 126 U/L - 70 56  AST 15 - 41 U/L - 18 23  ALT 0 - 44 U/L - 8 10   CBC Latest Ref Rng & Units 04/02/2021 04/01/2021 07/10/2020  WBC 4.0 - 10.5 K/uL 6.0 7.4 4.8  Hemoglobin 12.0 - 15.0 g/dL 9.2(L) 10.3(L) 8.0(L)  Hematocrit 36.0 - 46.0 % 28.8(L) 31.1(L) 28.3(L)  Platelets 150 - 400 K/uL 291 334 344    RADS: N/a  Assessment:   Colon obstruction secondary to likely mass.  Pt remains stable for surgery tomorrow.  Will discuss heparin drip status with cards. Otherwise, CPM.  If drip is started, please stop heparin drip by 10am tomorrow, in hopes that she maybe able to go earlier than the scheduled 3pm start time

## 2021-04-05 NOTE — Plan of Care (Signed)
Continuing with plan of care. 

## 2021-04-06 ENCOUNTER — Inpatient Hospital Stay: Payer: Medicare PPO | Admitting: Certified Registered Nurse Anesthetist

## 2021-04-06 ENCOUNTER — Encounter: Payer: Self-pay | Admitting: Internal Medicine

## 2021-04-06 ENCOUNTER — Encounter
Admission: EM | Disposition: A | Discharge status: Skilled Nursing Facility | Payer: Self-pay | Source: Home / Self Care | Attending: Internal Medicine

## 2021-04-06 DIAGNOSIS — I10 Essential (primary) hypertension: Secondary | ICD-10-CM | POA: Diagnosis not present

## 2021-04-06 DIAGNOSIS — C184 Malignant neoplasm of transverse colon: Principal | ICD-10-CM

## 2021-04-06 DIAGNOSIS — I482 Chronic atrial fibrillation, unspecified: Secondary | ICD-10-CM | POA: Diagnosis not present

## 2021-04-06 DIAGNOSIS — F418 Other specified anxiety disorders: Secondary | ICD-10-CM | POA: Diagnosis not present

## 2021-04-06 DIAGNOSIS — K56609 Unspecified intestinal obstruction, unspecified as to partial versus complete obstruction: Secondary | ICD-10-CM | POA: Diagnosis not present

## 2021-04-06 HISTORY — PX: INSERTION OF SUPRAPUBIC CATHETER: SHX5870

## 2021-04-06 LAB — CBC
HCT: 29.6 % — ABNORMAL LOW (ref 36.0–46.0)
Hemoglobin: 9.2 g/dL — ABNORMAL LOW (ref 12.0–15.0)
MCH: 25.5 pg — ABNORMAL LOW (ref 26.0–34.0)
MCHC: 31.1 g/dL (ref 30.0–36.0)
MCV: 82 fL (ref 80.0–100.0)
Platelets: 314 10*3/uL (ref 150–400)
RBC: 3.61 MIL/uL — ABNORMAL LOW (ref 3.87–5.11)
RDW: 14.3 % (ref 11.5–15.5)
WBC: 5.5 10*3/uL (ref 4.0–10.5)
nRBC: 0 % (ref 0.0–0.2)

## 2021-04-06 LAB — HEPARIN LEVEL (UNFRACTIONATED): Heparin Unfractionated: 0.42 IU/mL (ref 0.30–0.70)

## 2021-04-06 LAB — PROTIME-INR
INR: 1.6 — ABNORMAL HIGH (ref 0.8–1.2)
Prothrombin Time: 19 seconds — ABNORMAL HIGH (ref 11.4–15.2)

## 2021-04-06 SURGERY — COLECTOMY, PARTIAL, ROBOT-ASSISTED, LAPAROSCOPIC
Anesthesia: General | Site: Abdomen

## 2021-04-06 MED ORDER — ONDANSETRON HCL 4 MG/2ML IJ SOLN
INTRAMUSCULAR | Status: DC | PRN
  Administered 2021-04-06: 4 mg via INTRAVENOUS

## 2021-04-06 MED ORDER — ACETAMINOPHEN 10 MG/ML IV SOLN
INTRAVENOUS | Status: AC
  Filled 2021-04-06: qty 100

## 2021-04-06 MED ORDER — SUGAMMADEX SODIUM 500 MG/5ML IV SOLN
INTRAVENOUS | Status: DC | PRN
  Administered 2021-04-06: 150 mg via INTRAVENOUS

## 2021-04-06 MED ORDER — BUPIVACAINE-EPINEPHRINE (PF) 0.25% -1:200000 IJ SOLN
INTRAMUSCULAR | Status: AC
  Filled 2021-04-06: qty 30

## 2021-04-06 MED ORDER — FENTANYL CITRATE (PF) 100 MCG/2ML IJ SOLN
INTRAMUSCULAR | Status: DC | PRN
  Administered 2021-04-06: 25 ug via INTRAVENOUS
  Administered 2021-04-06: 50 ug via INTRAVENOUS
  Administered 2021-04-06 (×4): 25 ug via INTRAVENOUS
  Administered 2021-04-06: 50 ug via INTRAVENOUS

## 2021-04-06 MED ORDER — FENTANYL CITRATE (PF) 100 MCG/2ML IJ SOLN
INTRAMUSCULAR | Status: AC
  Filled 2021-04-06: qty 2

## 2021-04-06 MED ORDER — PROPOFOL 10 MG/ML IV BOLUS
INTRAVENOUS | Status: DC | PRN
  Administered 2021-04-06: 120 mg via INTRAVENOUS

## 2021-04-06 MED ORDER — PROPOFOL 10 MG/ML IV BOLUS
INTRAVENOUS | Status: AC
  Filled 2021-04-06: qty 20

## 2021-04-06 MED ORDER — LIDOCAINE HCL (CARDIAC) PF 100 MG/5ML IV SOSY
PREFILLED_SYRINGE | INTRAVENOUS | Status: DC | PRN
  Administered 2021-04-06: 80 mg via INTRAVENOUS

## 2021-04-06 MED ORDER — BUPIVACAINE LIPOSOME 1.3 % IJ SUSP
INTRAMUSCULAR | Status: AC
  Filled 2021-04-06: qty 20

## 2021-04-06 MED ORDER — FENTANYL CITRATE (PF) 100 MCG/2ML IJ SOLN: 25.0000 ug | INTRAMUSCULAR | Status: DC | PRN

## 2021-04-06 MED ORDER — FENTANYL CITRATE PF 50 MCG/ML IJ SOSY
12.5000 ug | PREFILLED_SYRINGE | INTRAMUSCULAR | Status: DC | PRN
  Administered 2021-04-07 (×4): 12.5 ug via INTRAVENOUS
  Filled 2021-04-06 (×4): qty 1

## 2021-04-06 MED ORDER — 0.9 % SODIUM CHLORIDE (POUR BTL) OPTIME
TOPICAL | Status: DC | PRN
  Administered 2021-04-06 (×2): 3000 mL
  Administered 2021-04-06: 500 mL

## 2021-04-06 MED ORDER — ACETAMINOPHEN 10 MG/ML IV SOLN
INTRAVENOUS | Status: DC | PRN
  Administered 2021-04-06: 1000 mg via INTRAVENOUS

## 2021-04-06 MED ORDER — EPHEDRINE SULFATE 50 MG/ML IJ SOLN
INTRAMUSCULAR | Status: DC | PRN
  Administered 2021-04-06: 10 mg via INTRAVENOUS

## 2021-04-06 MED ORDER — SODIUM CHLORIDE 0.9 % IV SOLN
INTRAVENOUS | Status: AC
  Filled 2021-04-06: qty 2

## 2021-04-06 MED ORDER — CHLORHEXIDINE GLUCONATE CLOTH 2 % EX PADS
6.0000 | MEDICATED_PAD | Freq: Every day | CUTANEOUS | Status: DC
Start: 2021-04-07 — End: 2021-04-13
  Administered 2021-04-07 – 2021-04-12 (×6): 6 via TOPICAL

## 2021-04-06 MED ORDER — LACTATED RINGERS IV SOLN: INTRAVENOUS | Status: DC | PRN

## 2021-04-06 MED ORDER — GLYCOPYRROLATE 0.2 MG/ML IJ SOLN
INTRAMUSCULAR | Status: DC | PRN
  Administered 2021-04-06: .2 mg via INTRAVENOUS

## 2021-04-06 MED ORDER — SODIUM CHLORIDE 0.9 % IV SOLN
2.0000 g | INTRAVENOUS | Status: AC
  Administered 2021-04-06: 2 g via INTRAVENOUS
  Filled 2021-04-06: qty 2

## 2021-04-06 MED ORDER — LACTATED RINGERS IV SOLN: INTRAVENOUS | Status: DC

## 2021-04-06 MED ORDER — PHENYLEPHRINE HCL (PRESSORS) 10 MG/ML IV SOLN
INTRAVENOUS | Status: DC | PRN
  Administered 2021-04-06: 50 ug via INTRAVENOUS
  Administered 2021-04-06 (×2): 100 ug via INTRAVENOUS

## 2021-04-06 MED ORDER — HYDROCODONE-ACETAMINOPHEN 7.5-325 MG PO TABS: 1.0000 | ORAL_TABLET | Freq: Once | ORAL | Status: DC | PRN

## 2021-04-06 MED ORDER — DEXAMETHASONE SODIUM PHOSPHATE 10 MG/ML IJ SOLN
INTRAMUSCULAR | Status: DC | PRN
  Administered 2021-04-06: 10 mg via INTRAVENOUS

## 2021-04-06 MED ORDER — BUPIVACAINE-EPINEPHRINE (PF) 0.25% -1:200000 IJ SOLN
INTRAMUSCULAR | Status: DC | PRN
  Administered 2021-04-06: 30 mL

## 2021-04-06 MED ORDER — ROCURONIUM BROMIDE 100 MG/10ML IV SOLN
INTRAVENOUS | Status: DC | PRN
  Administered 2021-04-06: 30 mg via INTRAVENOUS
  Administered 2021-04-06: 20 mg via INTRAVENOUS
  Administered 2021-04-06: 40 mg via INTRAVENOUS
  Administered 2021-04-06: 30 mg via INTRAVENOUS

## 2021-04-06 SURGICAL SUPPLY — 88 items
BAG LAPAROSCOPIC 12 15 PORT 16 (BASKET) IMPLANT
BAG RETRIEVAL 12/15 (BASKET)
BLADE SURG SZ10 CARB STEEL (BLADE) ×3 IMPLANT
CANNULA REDUC XI 12-8 STAPL (CANNULA) ×1
CANNULA REDUCER 12-8 DVNC XI (CANNULA) ×2 IMPLANT
CHLORAPREP W/TINT 26 (MISCELLANEOUS) ×3 IMPLANT
CLEANER CAUTERY TIP 5X5 PAD (MISCELLANEOUS) ×2 IMPLANT
COVER TIP SHEARS 8 DVNC (MISCELLANEOUS) ×2 IMPLANT
COVER TIP SHEARS 8MM DA VINCI (MISCELLANEOUS) ×1
DECANTER SPIKE VIAL GLASS SM (MISCELLANEOUS) ×3 IMPLANT
DEFOGGER SCOPE WARMER CLEARIFY (MISCELLANEOUS) ×3 IMPLANT
DERMABOND ADVANCED (GAUZE/BANDAGES/DRESSINGS) ×2
DERMABOND ADVANCED .7 DNX12 (GAUZE/BANDAGES/DRESSINGS) ×4 IMPLANT
DRAPE ARM DVNC X/XI (DISPOSABLE) ×8 IMPLANT
DRAPE COLUMN DVNC XI (DISPOSABLE) ×2 IMPLANT
DRAPE DA VINCI XI ARM (DISPOSABLE) ×4
DRAPE DA VINCI XI COLUMN (DISPOSABLE) ×1
ELECT REM PT RETURN 9FT ADLT (ELECTROSURGICAL) ×3
ELECTRODE REM PT RTRN 9FT ADLT (ELECTROSURGICAL) ×2 IMPLANT
GAUZE 4X4 16PLY ~~LOC~~+RFID DBL (SPONGE) ×3 IMPLANT
GELPOINT ADV PLATFORM (ENDOMECHANICALS) ×3
GLOVE SURG ORTHO LTX SZ7.5 (GLOVE) ×18 IMPLANT
GOWN STRL REUS W/ TWL LRG LVL3 (GOWN DISPOSABLE) ×12 IMPLANT
GOWN STRL REUS W/TWL LRG LVL3 (GOWN DISPOSABLE) ×6
GRASPER SUT TROCAR 14GX15 (MISCELLANEOUS) IMPLANT
HANDLE YANKAUER SUCT BULB TIP (MISCELLANEOUS) ×3 IMPLANT
IRRIGATION STRYKERFLOW (MISCELLANEOUS) ×2 IMPLANT
IRRIGATOR STRYKERFLOW (MISCELLANEOUS) ×3
IV NS IRRIG 3000ML ARTHROMATIC (IV SOLUTION) ×6 IMPLANT
KIT IMAGING PINPOINTPAQ (MISCELLANEOUS) ×3 IMPLANT
KIT PINK PAD W/HEAD ARE REST (MISCELLANEOUS) ×3 IMPLANT
KIT PINK PAD W/HEAD ARM REST (MISCELLANEOUS) ×2 IMPLANT
KIT TURNOVER KIT A (KITS) ×3 IMPLANT
MANIFOLD NEPTUNE II (INSTRUMENTS) ×3 IMPLANT
NEEDLE HYPO 22GX1.5 SAFETY (NEEDLE) ×3 IMPLANT
NEEDLE INSUFFLATION 14GA 120MM (NEEDLE) ×3 IMPLANT
NS IRRIG 500ML POUR BTL (IV SOLUTION) ×3 IMPLANT
PACK COLON CLEAN CLOSURE (MISCELLANEOUS) IMPLANT
PACK LAP CHOLECYSTECTOMY (MISCELLANEOUS) ×3 IMPLANT
PAD CLEANER CAUTERY TIP 5X5 (MISCELLANEOUS) ×1
PENCIL ELECTRO HAND CTR (MISCELLANEOUS) ×3 IMPLANT
PLATFORM STD W/COL CELL SVR (ENDOMECHANICALS) ×2 IMPLANT
PORT ACCESS TROCAR AIRSEAL 5 (TROCAR) ×3 IMPLANT
RELOAD STAPLER 3.5X45 BLU DVNC (STAPLE) ×6 IMPLANT
RELOAD STAPLER 3.5X60 BLU DVNC (STAPLE) IMPLANT
RELOAD STAPLER WHITE 60MM (STAPLE) ×2 IMPLANT
RETRACTOR WOUND ALXS 18CM MED (MISCELLANEOUS) IMPLANT
RETRACTOR WOUND ALXS 18CM SML (MISCELLANEOUS) ×2 IMPLANT
RTRCTR WOUND ALEXIS O 18CM MED (MISCELLANEOUS)
RTRCTR WOUND ALEXIS O 18CM SML (MISCELLANEOUS) ×3
SCISSORS METZENBAUM CVD 33 (INSTRUMENTS) ×3 IMPLANT
SEAL CANN UNIV 5-8 DVNC XI (MISCELLANEOUS) ×6 IMPLANT
SEAL XI 5MM-8MM UNIVERSAL (MISCELLANEOUS) ×3
SEALER VESSEL DA VINCI XI (MISCELLANEOUS) ×1
SEALER VESSEL EXT DVNC XI (MISCELLANEOUS) ×2 IMPLANT
SET BI-LUMEN FLTR TB AIRSEAL (TUBING) ×3 IMPLANT
SOLUTION ELECTROLUBE (MISCELLANEOUS) ×3 IMPLANT
SPONGE T-LAP 18X18 ~~LOC~~+RFID (SPONGE) IMPLANT
SPONGE T-LAP 4X18 ~~LOC~~+RFID (SPONGE) ×3 IMPLANT
STAPLE ECHEON FLEX 60 POW ENDO (STAPLE) ×3 IMPLANT
STAPLER 45 DA VINCI SURE FORM (STAPLE) ×1
STAPLER 45 SUREFORM DVNC (STAPLE) ×2 IMPLANT
STAPLER 60 DA VINCI SURE FORM (STAPLE)
STAPLER 60 SUREFORM DVNC (STAPLE) IMPLANT
STAPLER CANNULA SEAL DVNC XI (STAPLE) ×2 IMPLANT
STAPLER CANNULA SEAL XI (STAPLE) ×1
STAPLER RELOAD 3.5X45 BLU DVNC (STAPLE) ×6
STAPLER RELOAD 3.5X45 BLUE (STAPLE) ×3
STAPLER RELOAD 3.5X60 BLU DVNC (STAPLE)
STAPLER RELOAD 3.5X60 BLUE (STAPLE)
STAPLER RELOAD WHITE 60MM (STAPLE) ×3
STAPLER SKIN PROX 35W (STAPLE) IMPLANT
SUT MNCRL 4-0 (SUTURE) ×2
SUT MNCRL 4-0 27XMFL (SUTURE) ×4
SUT PDS PLUS 0 (SUTURE) ×1
SUT PDS PLUS AB 0 CT-2 (SUTURE) ×2 IMPLANT
SUT V-LOC 90 ABS 3-0 VLT  V-20 (SUTURE) ×1
SUT V-LOC 90 ABS 3-0 VLT V-20 (SUTURE) ×2 IMPLANT
SUT VIC AB 3-0 SH 27 (SUTURE) ×1
SUT VIC AB 3-0 SH 27X BRD (SUTURE) ×2 IMPLANT
SUT VIC AB 3-0 SH 8-18 (SUTURE) ×3 IMPLANT
SUTURE MNCRL 4-0 27XMF (SUTURE) ×4 IMPLANT
TRAY FOLEY MTR SLVR 16FR STAT (SET/KITS/TRAYS/PACK) ×3 IMPLANT
TRAY FOLEY SLVR 16FR LF STAT (SET/KITS/TRAYS/PACK) ×3 IMPLANT
TROCAR Z-THREAD FIOS 11X100 BL (TROCAR) ×3 IMPLANT
TROCAR Z-THREAD OPTICAL 5X100M (TROCAR) IMPLANT
TUBING EVAC SMOKE HEATED PNEUM (TUBING) ×3 IMPLANT
WATER STERILE IRR 500ML POUR (IV SOLUTION) ×3 IMPLANT

## 2021-04-06 NOTE — Op Note (Signed)
Robotic assisted subtotal colectomy with end ileostomy.  Pre-operative Diagnosis: Obstructing transverse colon cancer  Post-operative Diagnosis: same.    Surgeon: Ronny Bacon, M.D., [facility]  Anesthesia: General endotracheal  Findings: Dense thickening of distal transverse colon, without evidence of peritoneal implants, mesenteric metastases or hepatic metastases.  Estimated Blood Loss: 100 mL         Specimens: Subtotal colectomy          Complications: none              Procedure Details  The patient was seen again in the Holding Room. The benefits, complications, treatment options, and expected outcomes were discussed with the patient. The risks of bleeding, infection, recurrence of symptoms, failure to resolve symptoms, unanticipated injury, prosthetic placement, prosthetic infection, any of which could require further surgery were reviewed with the patient. The likelihood of improving the patient's symptoms with return to their baseline status is anticipated, though guarded.  The patient and/or family concurred with the proposed plan, giving informed consent.  The patient was taken to Operating Room, identified and the procedure verified.    Prior to the induction of general anesthesia, antibiotic prophylaxis was administered. VTE prophylaxis was in place.  General anesthesia was then administered and tolerated well. After the induction, the patient was positioned in the supine position and the abdomen was prepped with Chloraprep and draped in the sterile fashion.  A Time Out was held and the above information confirmed.  After local infiltration of quarter percent Marcaine with epinephrine, stab incision was made left upper quadrant.  Just below the costal margin approximately midclavicular line the Veress needle is passed with sensation of the layers to penetrate the abdominal wall and into the peritoneum.  Saline drop test is confirmed peritoneal placement.  Insufflation is initiated  with carbon dioxide to pressures of 15 mmHg. Right lower abdominal wall 11 mm optical trocar was placed under direct visualization.  Some midline adhesions were noted around the periumbilical area.  Along the diagonal from the right lower quadrant to the left costal margin the additional robotic trochars were placed, a 12 mm with anticipated use of stapler was placed in the right mid quadrant, 8.5 mm trochars were placed in the left upper quadrant.  It was noted that her bladder was significantly enlarged and we had deferred urological consult due to difficulty in finding the patient she urethra.  However it was felt prudent to defer proceeding with surgery until catheter was placed considering the degree of bladder distention. Appreciate Dr. Glori Luis coming to assist [place] with this. During this interval we proceeded with laparoscopic lysis of the anterior abdominal wall adhesions. Once the robot was docked from the left side we were oriented toward the pelvis.  We first evaluated the right lower quadrant mobilizing the distal ileal adhesions and cecum from the right pelvic sidewall and right lower quadrant. Then proceeded with retroperitoneal dissection identifying the inferior mesenteric artery, dividing this at its base with the vessel sealer and proceeding with dissection of the mesentery from the retroperitoneum, identifying the left ureter and mobilizing the sigmoid mesentery from medial to lateral.  I then took down the lateral attachments of the sigmoid, mobilizing it medially and enabling transection.  Utilizing 2 firings of the 45 mm robotic stapler proceeded with division of the sigmoid colon.  We then continued to perform a medial to lateral dissection of the left colonic mesentery from the retroperitoneum.  Also dividing the lateral abdominal wall attachments to the left colon, progressing  from inferior to cephalad by utilizing vessel sealer and blunt dissection. Then proceeded with mobilizing of the  splenic flexure and the distal transverse colon. I then reoriented the robot for upper abdominal surgery, redocking the robot. Then mobilized the distal ileum along with its ileocolic mesentery from the retroperitoneum and medial to lateral fashion.  Identified the duodenum and proceeded with mobilization of the hepatic flexure. We eventually divided the distal ileum with the stapler, and began mesenteric division dividing the ileocolic pedicle and progressing along the right colonic mesentery to approach the middle colic pedicle. Mobilizing the rather redundant transverse colon became somewhat challenging identifying the planes and mobilizing the omentum from the lesser sac and completing the transverse colonic mobilization. Thinking we had completely freed our specimen I proceeded with a Pfannenstiel incision for specimen extraction.  The robot was undocked in order to do this.  We then extracted our specimen after wound protector was placed, only finding it still tethered to some of the transverse mesocolon.  I been deceived and thinking it was completely mobilized.  At the bedside with laparoscopic technique I utilized a vascular stapler to complete the division of the transverse colon mesentery.  This enabled complete extraction of the specimen. The Pfannenstiel incision was then closed reapproximating the fascia with 0 PDS in a running manner. Then closed the right lower quadrant 11 mm trocar site with transvaginal suturing using a PMI with cone under direct visualization. I then saw that I could deliver the distal ileum to the previously marked #1 site that we had utilized the 12 mm robotic port.  Once I saw it would reach the area easily without tension I proceeded with irrigating the abdominal cavity with slightly under 6 L of sterile irrigant ensuring adequate hemostasis. I then delivered the distal ileum to the skin of the port site and desufflated the abdomen. We then closed the remaining  ports, completing skin closure with interrupted and running 4-0 Monocryl.  Dermabond was applied. The ileostomy was then matured with interrupted 3-0 Vicryl's.  Ileostomy appliance was then fashioned and secured on Mastisol. Patient was successfully extubated and brought to the recovery room in stable condition.      Ronny Bacon M.D., [facility] 04/06/2021 9:12 PM

## 2021-04-06 NOTE — Transfer of Care (Signed)
Immediate Anesthesia Transfer of Care Note  Patient: Gabriela [name]  Procedure(s) Performed: XI ROBOT ASSISTED LAPAROSCOPIC PARTIAL COLECTOMY (Abdomen) INSERTION OF FOLEY CATHETER  Patient Location: PACU  Anesthesia Type:General  Level of Consciousness: sedated and patient cooperative  Airway & Oxygen Therapy: Patient Spontanous Breathing and Patient connected to face mask oxygen  Post-op Assessment: Report given to RN and Post -op Vital signs reviewed and stable  Post vital signs: Reviewed and stable  Last Vitals:  Vitals Value Taken Time  BP    Temp    Pulse    Resp    SpO2      Last Pain:  Vitals:   04/06/21 1259  TempSrc: Oral  PainSc: 0-No pain         Complications: No notable events documented.

## 2021-04-06 NOTE — Progress Notes (Addendum)
[place] Hospital Day(s): 5.    Interval History:  Patient seen and examined No acute events or new complaints overnight.  Patient reports she is feeling good this morning; anxious for surgery, hoping it moves up No fever, chills, nausea, emesis She remains without leukocytosis with WBC at 5.5K this morning Hgb stable at 9.2 INR 1.6 She is currently NPO Plan for OR this afternoon with Dr Christian Mate  Vital signs in last 24 hours: [min-max] current  Temp:  [98.2 F (36.8 C)-98.7 F (37.1 C)] 98.7 F (37.1 C) (09/12 0729) Pulse Rate:  [50-69] 69 (09/12 0729) Resp:  [16-18] 18 (09/12 0729) BP: (144-150)/(63-82) 150/82 (09/12 0729) SpO2:  [98 %-99 %] 98 % (09/12 0729) Weight:  [67 kg] 67 kg (09/12 0448)     Height: '5\' 3"'$  (160 cm) Weight: 67 kg BMI (Calculated): 26.17   Intake/Output last 2 shifts:  09/11 0701 - 09/12 0700 In: [id number] [P.O.:1020; I.V.:102] Out: -    Physical Exam:  Constitutional: alert, cooperative and no distress  HENT: normocephalic without obvious abnormality  Eyes: PERRL, EOM's grossly intact and symmetric  Respiratory: breathing non-labored at rest  Cardiovascular: regular rate and sinus rhythm  Gastrointestinal: soft, non-tender, distension resolved, no rebound/guarding  Musculoskeletal: no edema or wounds, motor and sensation grossly intact, NT   Labs:  CBC Latest Ref Rng & Units 04/06/2021 04/05/2021 04/02/2021  WBC 4.0 - 10.5 K/uL 5.5 6.2 6.0  Hemoglobin 12.0 - 15.0 g/dL 9.2(L) 9.8(L) 9.2(L)  Hematocrit 36.0 - 46.0 % 29.6(L) 30.3(L) 28.8(L)  Platelets 150 - 400 K/uL 314 329 291   CMP Latest Ref Rng & Units 04/02/2021 04/01/2021 07/10/2020  Glucose 70 - 99 mg/dL 100(H) 129(H) 124(H)  BUN 8 - 23 mg/dL '8 9 14  '$ Creatinine 0.44 - 1.00 mg/dL 0.56 0.62 0.68  Sodium 135 - 145 mmol/L 137 138 141  Potassium 3.5 - 5.1 mmol/L 3.5 2.7(LL) 3.7  Chloride 98 - 111 mmol/L 104 102 106  CO2 22 - 32 mmol/L '24 25 25  '$ Calcium 8.9  - 10.3 mg/dL 8.2(L) 9.1 8.9  Total Protein 6.5 - 8.1 g/dL - 7.3 7.5  Total Bilirubin 0.3 - 1.2 mg/dL - 0.8 0.6  Alkaline Phos 38 - 126 U/L - 70 56  AST 15 - 41 U/L - 18 23  ALT 0 - 44 U/L - 8 10     Imaging studies: No new pertinent imaging studies   Assessment/Plan: (ICD-10's: K39.89) 87 y.o. female with progressive abdominal distension and constipation found to have thickening of the distal transverse colon with proximal dilation concerning for partially/near complete obstructing malignancy   - Will plan on robotic assisted laparoscopic subtotal colectomy, possible colostomy, possible ileostomy today with Dr Christian Mate pending OR/Anesthesia availability - All risks, benefits, and alternatives to above procedure(s) were discussed with the patient, all of her questions were answered to her expressed satisfaction, patient expresses she wishes to proceed, and informed consent was obtained.    - NPO + IVF resuscitation  - Prophylactic Abx (Cefotetan)   - Monitor abdominal examination; going bowel function - Pain control prn; antiemetics prn   - DVT prophylaxis; hold for potential surgery; monitor INR             - Further management per primary service; we will follow     All of the above findings and recommendations were discussed with the patient, and the medical team, and all of patient's questions were answered to her expressed satisfaction.  --  Edison Simon, PA-C [facility] 04/06/2021, 7:36 AM [phone] M-F: 7am - 4pm  Ronny Bacon, M.D., [facility]  04/06/2021 ; 1:51 PM

## 2021-04-06 NOTE — Progress Notes (Signed)
[facility]    SUBJECTIVE: Seems to doing reasonably well still asleep resting comfortably denies any pain   Vitals:   04/05/21 1913 04/06/21 0448 04/06/21 0729 04/06/21 1259  BP: (!) 144/65 (!) 148/63 (!) 150/82 (!) 174/78  Pulse: 66 (!) 50 69 68  Resp: '16 16 18 17  '$ Temp: 98.2 F (36.8 C) 98.4 F (36.9 C) 98.7 F (37.1 C) 98.3 F (36.8 C)  TempSrc: Oral Oral Oral Oral  SpO2: 99% 98% 98% 96%  Weight:  67 kg  67 kg  Height:    '5\' 3"'$  (1.6 m)     Intake/Output Summary (Last 24 hours) at 04/06/2021 2024 Last data filed at 04/06/2021 1700 Gross per 24 hour  Intake 1201.99 ml  Output 1100 ml  Net 101.99 ml      PHYSICAL EXAM  General: Well developed, well nourished, in no acute distress HEENT:  Normocephalic and atramatic Neck:  No JVD.  Lungs: Clear bilaterally to auscultation and percussion. Heart: HRRR . Normal S1 and S2 without gallops or murmurs.  Abdomen: Bowel sounds are positive, abdomen soft and non-tender  Msk:  Back normal, normal gait. Normal strength and tone for age. Extremities: No clubbing, cyanosis or edema.   Neuro: Alert and oriented X 3. Psych:  Good affect, responds appropriately   LABS: Basic Metabolic Panel: No results for input(s): NA, K, CL, CO2, GLUCOSE, BUN, CREATININE, CALCIUM, MG, PHOS in the last 72 hours. Liver Function Tests: No results for input(s): AST, ALT, ALKPHOS, BILITOT, PROT, ALBUMIN in the last 72 hours. No results for input(s): LIPASE, AMYLASE in the last 72 hours. CBC: Recent Labs    04/05/21 1255 04/06/21 0426  WBC 6.2 5.5  HGB 9.8* 9.2*  HCT 30.3* 29.6*  MCV 82.6 82.0  PLT 329 314   Cardiac Enzymes: No results for input(s): CKTOTAL, CKMB, CKMBINDEX, TROPONINI in the last 72 hours. BNP: Invalid input(s): POCBNP D-Dimer: No results for input(s): DDIMER in the last 72 hours. Hemoglobin A1C: No results for input(s): HGBA1C in the last 72 hours. Fasting Lipid Panel: No results for input(s): CHOL, HDL, LDLCALC,  TRIG, CHOLHDL, LDLDIRECT in the last 72 hours. Thyroid Function Tests: No results for input(s): TSH, T4TOTAL, T3FREE, THYROIDAB in the last 72 hours.  Invalid input(s): FREET3 Anemia Panel: No results for input(s): VITAMINB12, FOLATE, FERRITIN, TIBC, IRON, RETICCTPCT in the last 72 hours.  No results found.   TELEMETRY: Atrial fibrillation rate reviewed reasonably controlled 70:  ASSESSMENT AND PLAN:  Principal Problem:   Abdominal pain Active Problems:   Hypertension   Hypothyroidism   Stroke ([place])   Hypokalemia   Depression with anxiety   Atrial fibrillation, chronic (HCC)   Normocytic anemia   Plan Preop for GI surgery Acceptable surgical risk for GI surgery Currently on heparin for bridging because of mechanical valve Recommend discontinuing heparin prior to surgery Hopefully can restart heparin.  When it safe from a surgical standpoint Once the patient begins to recover will consider restarting Coumadin in anticipation of p.o. intake and discharge    Trini Christiansen D Pasquale Matters, MD,8:24 PM

## 2021-04-06 NOTE — Procedures (Signed)
   UROLOGY PROCEDURE NOTE  Indication: Consulted intraoperatively by Dr. Christian Mate to assist in Foley catheter placement.  Nursing unable to place Foley at beginning of case, bladder distended upon entry into the abdomen, and Foley catheter requested.  The patient was undocked, and prepped and draped in standard sterile fashion in the frog-leg position.  On exam, there was some urethral and vaginal atrophy.  A 16 F Coloplast catheter passed easily into the bladder with return of clear yellow urine.  10 cc were placed in the balloon, the catheter was connected to dependent drainage, and the Foley was secured to the thigh.  Plan: Foley duration per primary team, no urology follow-up needed.  She has a remote history of bladder cancer that was previously followed by Dr. Erlene Quan, and using shared decision-making they opted to defer any further cystoscopy with her age and comorbidities  Gabriela Madrid, MD 04/06/2021

## 2021-04-06 NOTE — Progress Notes (Signed)
Patient transported to surgery via bed.

## 2021-04-06 NOTE — Consult Note (Signed)
ANTICOAGULATION CONSULT NOTE   Pharmacy Consult for heparin infusion  Indication: atrial fibrillation  Allergies  Allergen Reactions   Celebrex [Celecoxib] Anaphylaxis   Morphine And Related Swelling    Lips and mouth   Povidone-Iodine Other (See Comments)    Other reaction(s): ITCHING Other reaction(s): ITCHING    Sulfa Antibiotics     Patient Measurements: Height: '5\' 3"'$  (160 cm) Weight: 67 kg (147 lb 11.3 oz) IBW/kg (Calculated) : 52.4 Heparin Dosing Weight: 64.9  Vital Signs: Temp: 98.4 F (36.9 C) (09/12 0448) Temp Source: Oral (09/12 0448) BP: 148/63 (09/12 0448) Pulse Rate: 50 (09/12 0448)  Labs: Recent Labs    04/04/21 0406 04/05/21 1128 04/05/21 1255 04/05/21 2052 04/06/21 0426  HGB  --   --  9.8*  --  9.2*  HCT  --   --  30.3*  --  29.6*  PLT  --   --  329  --  314  APTT  --   --  36  --   --   LABPROT 24.0* 19.2*  --   --  19.0*  INR 2.2* 1.6*  --   --  1.6*  HEPARINUNFRC  --   --   --  <0.10* 0.42     Estimated Creatinine Clearance: 45.5 mL/min (by C-G formula based on SCr of 0.56 mg/dL).   Medical History: Past Medical History:  Diagnosis Date   Acid reflux    Anxiety    Arrhythmia    Bladder cancer ([place])    Breast cancer ([place])    Cancer ([place])    breast,bladder   Depression    Dysrhythmia    afib   GERD (gastroesophageal reflux disease)    History of hiatal hernia    HOH (hard of hearing)    aids   Hypertension    Hyperthyroidism    Hypothyroidism    Stroke ([place])    2010   TIA (transient ischemic attack)     Medications:  Scheduled:   amLODipine  5 mg Oral Daily   bisacodyl  10 mg Rectal Once   irbesartan  300 mg Oral Daily   levothyroxine  112 mcg Oral QAC breakfast   multivitamin with minerals  1 tablet Oral Daily   pantoprazole  40 mg Oral Daily   polyethylene glycol  17 g Oral Daily   sertraline  50 mg Oral QHS    Assessment: 87 y.o. female with medical history significant of hypertension, stroke, TIA, GERD,  hypothyroidism, depression with anxiety, hard of hearing, hiatal hernia, atrial fibrillation on Coumadin, breast cancer, bladder cancer, anemia, who presents with abdominal pain and constipation. Pharmacy has been consulted for heparin infusion dosing.   Surgery was consulted for colon obstruction secondary to mass, plans for surgical intervention on 9/12. Pt to be placed on heparin infusion in place of warfarin given planned procedure.   Labs: Baseline CBC was within normal limits. INR trend 2.2>2.5>2.2>1.6.   9/11 APTT and CBC pending   Goal of Therapy:  Heparin level 0.3-0.7 units/ml Monitor platelets by anticoagulation protocol: Yes  9/11 2052 HL <0.1 9/12 0426 HL 0.42, therapeutic x 1   Plan:  Continue heparin infusion at 1150 units/hr Heparin drip scheduled to be stopped prior to procedure ~ 1000 this morning Continue to monitor H&H and platelets  Renda Rolls, PharmD, [facility] 04/06/2021 6:03 AM

## 2021-04-06 NOTE — OR Nursing (Signed)
Foley catheter was attempted. Was not able to visualize ureteral opening.

## 2021-04-06 NOTE — Anesthesia Procedure Notes (Signed)
Procedure Name: Intubation Date/Time: 04/06/2021 2:15 PM Performed by: Johnna Acosta, CRNA Pre-anesthesia Checklist: Patient identified, Emergency Drugs available, Suction available, Patient being monitored and Timeout performed Patient Re-evaluated:Patient Re-evaluated prior to induction Oxygen Delivery Method: Circle system utilized Preoxygenation: Pre-oxygenation with 100% oxygen Induction Type: IV induction Ventilation: Mask ventilation without difficulty and Oral airway inserted - appropriate to patient size Laryngoscope Size: McGraph and 3 Grade View: Grade I Tube type: Oral Tube size: 7.0 mm Number of attempts: 1 Airway Equipment and Method: Stylet and Video-laryngoscopy Placement Confirmation: ETT inserted through vocal cords under direct vision, positive ETCO2 and breath sounds checked- equal and bilateral Secured at: 19 cm Tube secured with: Tape Dental Injury: Teeth and Oropharynx as per pre-operative assessment

## 2021-04-06 NOTE — Anesthesia Preprocedure Evaluation (Signed)
Anesthesia Evaluation  Patient identified by MRN, date of birth, ID band Patient awake    Reviewed: Allergy & Precautions, NPO status , Patient's Chart, lab work & pertinent test results  Airway Mallampati: II  TM Distance: >3 FB Neck ROM: full    Dental  (+) Chipped, Lower Dentures, Upper Dentures   Pulmonary neg pulmonary ROS,    Pulmonary exam normal        Cardiovascular hypertension, Normal cardiovascular exam+ dysrhythmias Atrial Fibrillation      Neuro/Psych PSYCHIATRIC DISORDERS Depression CVA    GI/Hepatic Neg liver ROS, hiatal hernia, GERD  Medicated,  Endo/Other  Hypothyroidism   Renal/GU negative Renal ROS  negative genitourinary   Musculoskeletal negative musculoskeletal ROS (+)   Abdominal Normal abdominal exam  (+)   Peds negative pediatric ROS (+)  Hematology  (+) Blood dyscrasia, anemia ,   Anesthesia Other Findings Past Medical History: No date: Acid reflux No date: Anxiety No date: Arrhythmia No date: Bladder cancer (HCC) No date: Breast cancer ([place]) No date: Cancer ([place])     Comment:  breast,bladder No date: Depression No date: Dysrhythmia     Comment:  afib No date: GERD (gastroesophageal reflux disease) No date: History of hiatal hernia No date: HOH (hard of hearing)     Comment:  aids No date: Hypertension No date: Hyperthyroidism No date: Hypothyroidism No date: Stroke [facility]     Comment:  2010 No date: TIA (transient ischemic attack)  Past Surgical History: No date: ABDOMINAL HYSTERECTOMY No date: ANKLE ARTHODESIS W/ ARTHROSCOPY No date: BLADDER SURGERY 12/02/2014: CATARACT EXTRACTION W/PHACO; Left     Comment:  Procedure: CATARACT EXTRACTION PHACO AND INTRAOCULAR               LENS PLACEMENT (IOC);  Surgeon: Estill Cotta, MD;                Location: [facility] ORS;  Service: Ophthalmology;  Laterality:              Left;  US01:45 AP%25.7 CDE42.99 No date:  CHOLECYSTECTOMY No date: EYE SURGERY     Comment:  cataract No date: JOINT REPLACEMENT     Comment:  bil tkr No date: MASTECTOMY     Comment:  partial right No date: REPLACEMENT TOTAL KNEE  BMI    Body Mass Index: 26.17 kg/m      Reproductive/Obstetrics negative OB ROS                             Anesthesia Physical Anesthesia Plan  ASA: 3  Anesthesia Plan: General   Post-op Pain Management:    Induction: Intravenous  PONV Risk Score and Plan: 1 and Propofol infusion and TIVA  Airway Management Planned:   Additional Equipment:   Intra-op Plan:   Post-operative Plan:   Informed Consent: I have reviewed the patients History and Physical, chart, labs and discussed the procedure including the risks, benefits and alternatives for the proposed anesthesia with the patient or authorized representative who has indicated his/her understanding and acceptance.     Dental Advisory Given  Plan Discussed with: Anesthesiologist, CRNA and Surgeon  Anesthesia Plan Comments:         Anesthesia Quick Evaluation

## 2021-04-06 NOTE — Progress Notes (Signed)
[place] [facility] [place] NAME: Mirtie Cavett    MR#:  [id number]  DATE OF BIRTH:  [birth year]  SUBJECTIVE:  patient came in with abdominal pain and speech loss for few months. Workup in the showed large distal transverse colon mass.   Npo for scheduled surgery at 3 pm Heparin gtt + REVIEW OF SYSTEMS:   Review of Systems  Constitutional:  Negative for chills, fever and weight loss.  HENT:  Negative for ear discharge, ear pain and nosebleeds.   Eyes:  Negative for blurred vision, pain and discharge.  Respiratory:  Negative for sputum production, shortness of breath, wheezing and stridor.   Cardiovascular:  Negative for chest pain, palpitations, orthopnea and PND.  Gastrointestinal:  Positive for abdominal pain. Negative for diarrhea, nausea and vomiting.  Genitourinary:  Negative for frequency and urgency.  Musculoskeletal:  Negative for back pain and joint pain.  Neurological:  Negative for sensory change, speech change, focal weakness and weakness.  Psychiatric/Behavioral:  Negative for depression and hallucinations. The patient is not nervous/anxious.   Tolerating Diet:NPO Tolerating PT: ambulatory  DRUG ALLERGIES:   Allergies  Allergen Reactions  . Celebrex [Celecoxib] Anaphylaxis  . Morphine And Related Swelling    Lips and mouth  . Povidone-Iodine Other (See Comments)    Other reaction(s): ITCHING Other reaction(s): ITCHING   . Sulfa Antibiotics     VITALS:  Blood pressure (!) 150/82, pulse 69, temperature 98.7 F (37.1 C), temperature source Oral, resp. rate 18, height '5\' 3"'$  (1.6 m), weight 67 kg, SpO2 98 %.  PHYSICAL EXAMINATION:   Physical Exam  GENERAL:  87 y.o.-year-old patient lying in the bed with no acute distress.  LUNGS: Normal breath sounds bilaterally, no wheezing, rales, rhonchi. No use of accessory muscles of respiration.  CARDIOVASCULAR: S1, S2 normal. No murmurs, rubs, or gallops.  ABDOMEN: Soft, nontender, +  distended. Bowel sounds present. No organomegaly or mass.  EXTREMITIES: No cyanosis, clubbing or edema b/l.    NEUROLOGIC: non focal PSYCHIATRIC:  patient is alert and oriented x 3.  SKIN: No obvious rash, lesion, or ulcer.   LABORATORY PANEL:  CBC Recent Labs  Lab 04/06/21 0426  WBC 5.5  HGB 9.2*  HCT 29.6*  PLT 314     Chemistries  Recent Labs  Lab 04/01/21 0857 04/02/21 0728  NA 138 137  K 2.7* 3.5  CL 102 104  CO2 25 24  GLUCOSE 129* 100*  BUN 9 8  CREATININE 0.62 0.56  CALCIUM 9.1 8.2*  MG 2.1  --   AST 18  --   ALT 8  --   ALKPHOS 70  --   BILITOT 0.8  --     Cardiac Enzymes No results for input(s): TROPONINI in the last 168 hours. RADIOLOGY:  No results found. ASSESSMENT AND PLAN:  KEVONNA OCEGUEDA is a 87 y.o. female with medical history significant of hypertension, stroke, TIA, GERD, hypothyroidism, depression with anxiety, hard of hearing, hiatal hernia, atrial fibrillation on Coumadin, breast cancer, bladder cancer, anemia, who presents with abdominal pain and constipation.   Abdominal pain/Large bowel obstruction (partial) due to Colon mass worrisome for malignancy -- CT scan findings are concerning for thickening of the distal transverse colon with proximal dilation. Cannot r/o obstructing malignancy.  --Dr. Alice Reichert of GI is consulted--> recommend Laxative regimen and not recommend colonoscopy.  --Surgery consult with Dr. Christian Mate --> for colectomy and ?colostomy on 9/9.  -clear diet now, and NPO at midnight  on Thursday  -prn Zofran for nausea and fentanyl for pain -IV fluid: 75 cc/h for normal CD --9/9-- since patient was having several loose bowel movements surgery was held in case patient would better benefit from colonoscopy how were patient was evaluated by Dr. Alice Reichert and he recommends surgery. Colonoscopy is relative contraindicated in this case. -- 9/10--Barium enema--showed narrow transition in distal transverse colon. Surgery planned for  Monday --9/11-- check INR. If <2.0 will start heparin gtt pre-operatively. D/w dr Lysle Pearl --9/12--surgery at 3 pm.    Hypertension -IV hydralazine as needed -Amlodipine, Cardizem   Hypothyroidism -Synthroid   Stroke [facility]): Patient is on Coumadin for A. fib -Hold Coumadin --INR 2.5--hold IV heparin bridge for now--will likely need it post-op if INR is less than two --d/w pt's dter, and surgery --9/9-- INR 2.5 --9/10--INR 2.2 --9/11--INR 1.6--heparin bridge started --9/12-- heparin gtt to be held at 10 am today   Hypokalemia: Potassium 2.7. repleted   Depression with anxiety -Continue home medications   Atrial fibrillation, chronic (HCC) -Hold Coumadin -Cardizem --cardiology consult for pre-op clearance with dr Clayborn Bigness noted   Normocytic anemia: Hemoglobin stable, 10.3 today, 0.8 on 12/09/2019 -Follow-up with CBC   DVT ppx: SCD Code Status: DNR   Family Communication: Daughter [name] on the phone Disposition Plan:  Anticipate discharge back to previous environment Consults called:  Dr. Alice Reichert of GI and Dr. Christian Mate  Admission status and Level of care: Med-Surg:          Status is: Inpatient   Remains inpatient appropriate because:Inpatient level of care appropriate due to severity of illness   Dispo: The patient is from: Home              Anticipated d/c is to:  to be determined              Patient currently is not medically stable to d/c.              Difficult to place patient No   Planned to colon surgery in am  TOTAL TIME TAKING CARE OF THIS PATIENT: 25 minutes.  >50% time spent on counselling and coordination of care  Note: This dictation was prepared with Dragon dictation along with smaller phrase technology. Any transcriptional errors that result from this process are unintentional.  Fritzi Mandes M.D    [facility]   CC: Primary care physician; [name], MD Patient ID: Imogene Burn, female   DOB: Feb 20, 1932, 87 y.o.   MRN:  [id number]

## 2021-04-07 ENCOUNTER — Encounter: Payer: Self-pay | Admitting: Surgery

## 2021-04-07 DIAGNOSIS — I482 Chronic atrial fibrillation, unspecified: Secondary | ICD-10-CM | POA: Diagnosis not present

## 2021-04-07 DIAGNOSIS — F418 Other specified anxiety disorders: Secondary | ICD-10-CM | POA: Diagnosis not present

## 2021-04-07 DIAGNOSIS — I10 Essential (primary) hypertension: Secondary | ICD-10-CM | POA: Diagnosis not present

## 2021-04-07 DIAGNOSIS — K56609 Unspecified intestinal obstruction, unspecified as to partial versus complete obstruction: Secondary | ICD-10-CM | POA: Diagnosis not present

## 2021-04-07 LAB — BASIC METABOLIC PANEL
Anion gap: 10 (ref 5–15)
BUN: 7 mg/dL — ABNORMAL LOW (ref 8–23)
CO2: 28 mmol/L (ref 22–32)
Calcium: 8 mg/dL — ABNORMAL LOW (ref 8.9–10.3)
Chloride: 102 mmol/L (ref 98–111)
Creatinine, Ser: 0.57 mg/dL (ref 0.44–1.00)
GFR, Estimated: >60 mL/min (ref >60)
Glucose, Bld: 106 mg/dL — ABNORMAL HIGH (ref 70–99)
Potassium: 2.6 mmol/L — CL (ref 3.5–5.1)
Sodium: 140 mmol/L (ref 135–145)

## 2021-04-07 LAB — CBC
HCT: 28 % — ABNORMAL LOW (ref 36.0–46.0)
Hemoglobin: 8.7 g/dL — ABNORMAL LOW (ref 12.0–15.0)
MCH: 25.6 pg — ABNORMAL LOW (ref 26.0–34.0)
MCHC: 31.1 g/dL (ref 30.0–36.0)
MCV: 82.4 fL (ref 80.0–100.0)
Platelets: 291 10*3/uL (ref 150–400)
RBC: 3.4 MIL/uL — ABNORMAL LOW (ref 3.87–5.11)
RDW: 14.2 % (ref 11.5–15.5)
WBC: 11.6 10*3/uL — ABNORMAL HIGH (ref 4.0–10.5)
nRBC: 0 % (ref 0.0–0.2)

## 2021-04-07 LAB — POTASSIUM: Potassium: 2.8 mmol/L — ABNORMAL LOW (ref 3.5–5.1)

## 2021-04-07 LAB — GLUCOSE, CAPILLARY: Glucose-Capillary: 102 mg/dL — ABNORMAL HIGH (ref 70–99)

## 2021-04-07 MED ORDER — POTASSIUM CHLORIDE 20 MEQ PO PACK
40.0000 meq | PACK | Freq: Once | ORAL | Status: AC
  Administered 2021-04-07: 40 meq via ORAL
  Filled 2021-04-07: qty 2

## 2021-04-07 MED ORDER — POTASSIUM CHLORIDE 10 MEQ/100ML IV SOLN
10.0000 meq | INTRAVENOUS | Status: AC
Start: 2021-04-07 — End: 2021-04-07
  Administered 2021-04-07 (×2): 10 meq via INTRAVENOUS
  Filled 2021-04-07 (×2): qty 100

## 2021-04-07 MED ORDER — HYDROMORPHONE HCL 1 MG/ML IJ SOLN
0.5000 mg | INTRAMUSCULAR | Status: DC | PRN
  Administered 2021-04-07 – 2021-04-19 (×33): 1 mg via INTRAVENOUS
  Filled 2021-04-07 (×37): qty 1

## 2021-04-07 MED ORDER — POTASSIUM CHLORIDE 10 MEQ/100ML IV SOLN: 10.0000 meq | INTRAVENOUS | Status: DC

## 2021-04-07 MED ORDER — POTASSIUM CHLORIDE 10 MEQ/100ML IV SOLN
10.0000 meq | INTRAVENOUS | Status: DC
Start: 2021-04-07 — End: 2021-04-07
  Administered 2021-04-07 (×2): 10 meq via INTRAVENOUS
  Filled 2021-04-07 (×3): qty 100

## 2021-04-07 MED ORDER — ACETAMINOPHEN 10 MG/ML IV SOLN
1000.0000 mg | Freq: Four times a day (QID) | INTRAVENOUS | Status: AC
  Administered 2021-04-07: 1000 mg via INTRAVENOUS
  Filled 2021-04-07 (×4): qty 100

## 2021-04-07 MED ORDER — ALUM & MAG HYDROXIDE-SIMETH 200-200-20 MG/5ML PO SUSP
30.0000 mL | Freq: Four times a day (QID) | ORAL | Status: DC | PRN
  Administered 2021-04-07 – 2021-04-20 (×2): 30 mL via ORAL
  Filled 2021-04-07 (×2): qty 30

## 2021-04-07 MED ORDER — POTASSIUM CHLORIDE 10 MEQ/100ML IV SOLN
10.0000 meq | INTRAVENOUS | Status: DC
  Administered 2021-04-07 (×3): 10 meq via INTRAVENOUS
  Filled 2021-04-07 (×2): qty 100

## 2021-04-07 NOTE — Consult Note (Signed)
[place] Nurse ostomy follow up Patient receiving care in [facility] 203. Stoma type/location: RUQ ileostomy Stomal assessment/size: deferred Peristomal assessment: deferred Treatment options for stomal/peristomal skin: barrier ring if needed Output: approximately 25 ml of serosanginous in existing operative pouch Ostomy pouching: 2pc. 1 and 1/4 inch flat Education provided: I explained to the patient I will return mid-day when her daughter, [name], is likely to be here, and provide a teaching session for both of them. Enrolled patient in [place] [employer] Discharge program: No   Val Riles, RN, MSN, [facility], CNS-BC, pager [phone]

## 2021-04-07 NOTE — Progress Notes (Addendum)
[place] Hospital Day(s): 6.   Post op day(s): 1 Day Post-Op.   Interval History:  Patient seen and examined No acute events or new complaints overnight.  Patient reports she is having abdominal soreness, worse with positional changes No fever, chills, nausea, emesis She does have a slight leukocytosis this morning to 11.6K; likely reactive from surgery Hgb is stable at 8.7 Renal function remains normal; sCr - 0.57; UO - 1.8L Hypokalemia to 2.6 o/w no significant electrolyte derangements NGT in place; no output She is NPO this morning  Vital signs in last 24 hours: [min-max] current  Temp:  [97.1 F (36.2 C)-98.7 F (37.1 C)] 97.5 F (36.4 C) (09/13 0425) Pulse Rate:  [57-113] 57 (09/13 0425) Resp:  [13-24] 16 (09/13 0425) BP: (107-174)/(51-82) 107/71 (09/13 0425) SpO2:  [93 %-100 %] 99 % (09/13 0425) Weight:  [67 kg-71.4 kg] 71.4 kg (09/13 0407)     Height: '5\' 3"'$  (160 cm) Weight: 71.4 kg BMI (Calculated): 27.89   Intake/Output last 2 shifts:  09/12 0701 - 09/13 0700 In: 2298.6 [I.V.:2198.6; IV Piggyback:100] Out: 1800 [Urine:1800]   Physical Exam:  Constitutional: alert, cooperative and no distress  HEENT: NGT in place; no output Respiratory: breathing non-labored at rest  Cardiovascular: regular rate and sinus rhythm  Gastrointestinal: Soft, incisional soreness, non-distended, no rebound/guarding. Ileostomy in the right abdomen, slight dusky/congested, bowel sweat in bag  Integumentary: Laparoscopic and Pfannenstiel incisions are CDI with dermabond, no erythema or drainage   Labs:  CBC Latest Ref Rng & Units 04/07/2021 04/06/2021 04/05/2021  WBC 4.0 - 10.5 K/uL 11.6(H) 5.5 6.2  Hemoglobin 12.0 - 15.0 g/dL 8.7(L) 9.2(L) 9.8(L)  Hematocrit 36.0 - 46.0 % 28.0(L) 29.6(L) 30.3(L)  Platelets 150 - 400 K/uL 291 314 329   CMP Latest Ref Rng & Units 04/07/2021 04/02/2021 04/01/2021  Glucose 70 - 99 mg/dL 106(H) 100(H) 129(H)  BUN 8 - 23  mg/dL 7(L) 8 9  Creatinine 0.44 - 1.00 mg/dL 0.57 0.56 0.62  Sodium 135 - 145 mmol/L 140 137 138  Potassium 3.5 - 5.1 mmol/L 2.6(LL) 3.5 2.7(LL)  Chloride 98 - 111 mmol/L 102 104 102  CO2 22 - 32 mmol/L '28 24 25  '$ Calcium 8.9 - 10.3 mg/dL 8.0(L) 8.2(L) 9.1  Total Protein 6.5 - 8.1 g/dL - - 7.3  Total Bilirubin 0.3 - 1.2 mg/dL - - 0.8  Alkaline Phos 38 - 126 U/L - - 70  AST 15 - 41 U/L - - 18  ALT 0 - 44 U/L - - 8     Imaging studies: No new pertinent imaging studies   Assessment/Plan:  87 y.o. female 1 Day Post-Op s/p robotic assisted laparoscopic subtotal colectomy with end ileostomy for obstructing transverse colon mass.   - Okay to initiate CLD as tolerated today - Continue foley catheter today at patient preference; will remove tomorrow  - Replete K+; monitor - Monitor abdominal examination; on-going ileostomy function - Engage WOC RN for ileostomy teaching   - Pain control prn; antiemetics prn    - Early mobilization encouraged as tolerated; engage PT if needed   - Hold anticoagulation for now; usually okay to restart 48 hours post-op  - Further management per primary service; we will follow     All of the above findings and recommendations were discussed with the patient, and the medical team, and all of patient's questions were answered to her expressed satisfaction.  -- Edison Simon, PA-C [facility] 04/07/2021, 7:20 AM  [phone] M-F: 7am - 4pm

## 2021-04-07 NOTE — Consult Note (Signed)
[place] Nurse ostomy follow up Patient receiving care in [facility] 203. Daughter, [name], and son, Marguerite Olea present for ostomy teaching session. Stoma type/location: RUQ ileostomy Stomal assessment/size: 1 1/4 inches, round, budded, edematous, sutures intact. Peristomal assessment: intact Treatment options for stomal/peristomal skin: barrier ring Output: 50 ml of serosanginous in existing pouch Ostomy pouching: 2pc. 2 and 1/4 inch two piece Education provided:  Enrolled patient in [employer] Discharge program: Yes, today.  Today the patient and her son and daughter participated in a pouch emptying/removal/application process.  Each of them were hands on involved in many of the steps of the processes involved including removal, cutting the new skin barrier, application of the new pouching system.  We discussed at length when crusting with stoma powder and skin barrier wipes would be used and that the barrier ring must be used each time a new system is placed.  Also when to empty.    I plan to see the patient and her family again later this week.  Val Riles, RN, MSN, CWOCN, CNS-BC, pager [phone]

## 2021-04-07 NOTE — Care Management (Addendum)
Discussed with patient on the phone. Fentanyl is not helping her. She has allergies to morphine with swelling of lip and mouth. She is okay to try Dilaudid and continued monitoring over here in the hospital. Will place orders for Dilaudid 0.5-1 mg every three hours as needed for moderate and severe pain.

## 2021-04-07 NOTE — Progress Notes (Signed)
[place] for Electrolyte Monitoring and Replacement   Recent Labs: Potassium (mmol/L)  Date Value  04/07/2021 2.6 (LL)  11/16/2012 3.3 (L)   Magnesium (mg/dL)  Date Value  04/01/2021 2.1   Calcium (mg/dL)  Date Value  04/07/2021 8.0 (L)   Calcium, Total (mg/dL)  Date Value  11/16/2012 8.8   Albumin (g/dL)  Date Value  04/01/2021 3.5  08/17/2011 3.7   Sodium (mmol/L)  Date Value  04/07/2021 140  11/16/2012 139     Assessment: 89YOF with PMH of HTN, stroke, TIA, GERD, hypothyroidism, depression with anxiety, hard of hearing, hiatal hernia, atrial fibrillation on warfarin, breast cancer, bladder cancer, anemia who presented with pain and constipation.  Goal of Therapy:  K 4 Others: WNL  Plan:  K 2.6 today. Kcl 10 mEq replaced for a total of 5 bags today. F/u K level 9/13 at 1800.   Wynelle Cleveland, PharmD Pharmacy Resident  04/07/2021 5:14 PM

## 2021-04-07 NOTE — Progress Notes (Signed)
[place] for Electrolyte Monitoring and Replacement   Recent Labs: Potassium (mmol/L)  Date Value  04/07/2021 2.8 (L)  11/16/2012 3.3 (L)   Magnesium (mg/dL)  Date Value  04/01/2021 2.1   Calcium (mg/dL)  Date Value  04/07/2021 8.0 (L)   Calcium, Total (mg/dL)  Date Value  11/16/2012 8.8   Albumin (g/dL)  Date Value  04/01/2021 3.5  08/17/2011 3.7   Sodium (mmol/L)  Date Value  04/07/2021 140  11/16/2012 139     Assessment: 89YOF with PMH of HTN, stroke, TIA, GERD, hypothyroidism, depression with anxiety, hard of hearing, hiatal hernia, atrial fibrillation on warfarin, breast cancer, bladder cancer, anemia who presented with pain and constipation. 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  Goal of Therapy:  K 4 Others: WNL  Plan:  --K 2.6 > 2.8 s/p KCl 10 mEq IV x 5 runs --KCl 40 mEq PO + 10 mEq IV x 2 (for a total of 60 mEq) --Recheck potassium with morning labs along with magnesium to ensure adequate replacement   Tawnya Crook, PharmD, BCPS Clinical Pharmacist 04/07/2021 7:07 PM

## 2021-04-07 NOTE — Anesthesia Postprocedure Evaluation (Signed)
Anesthesia Post Note  Patient: [place] Spizzirri Fang  Procedure(s) Performed: XI ROBOT ASSISTED LAPAROSCOPIC PARTIAL COLECTOMY (Abdomen) INSERTION OF FOLEY CATHETER  Patient location during evaluation: PACU Anesthesia Type: General Level of consciousness: awake and alert Pain management: pain level controlled Vital Signs Assessment: post-procedure vital signs reviewed and stable Respiratory status: spontaneous breathing, nonlabored ventilation, respiratory function stable and patient connected to nasal cannula oxygen Cardiovascular status: blood pressure returned to baseline and stable Postop Assessment: no apparent nausea or vomiting Anesthetic complications: no   No notable events documented.   Last Vitals:  Vitals:   04/07/21 0403 04/07/21 0425  BP: (!) 141/76 107/71  Pulse: 65 (!) 57  Resp: 16 16  Temp: 36.4 C (!) 36.4 C  SpO2: 96% 99%    Last Pain:  Vitals:   04/07/21 0435  TempSrc:   PainSc: 6                  Margaree Mackintosh

## 2021-04-07 NOTE — Progress Notes (Signed)
[facility] NAME: Gabriela [name]    MR#:  [id number]  DATE OF BIRTH:  [birth year]  SUBJECTIVE:  patient came in with abdominal pain and speech loss for few months. Workup in the showed large distal transverse colon mass.   POD#1 subtotal colectomy with Ileostomy REVIEW OF SYSTEMS:   Review of Systems  Constitutional:  Negative for chills, fever and weight loss.  HENT:  Negative for ear discharge, ear pain and nosebleeds.   Eyes:  Negative for blurred vision, pain and discharge.  Respiratory:  Negative for sputum production, shortness of breath, wheezing and stridor.   Cardiovascular:  Negative for chest pain, palpitations, orthopnea and PND.  Gastrointestinal:  Positive for abdominal pain. Negative for diarrhea, nausea and vomiting.  Genitourinary:  Negative for frequency and urgency.  Musculoskeletal:  Negative for back pain and joint pain.  Neurological:  Negative for sensory change, speech change, focal weakness and weakness.  Psychiatric/Behavioral:  Negative for depression and hallucinations. The patient is not nervous/anxious.   Tolerating Diet:CLD Tolerating PT: ambulatory  DRUG ALLERGIES:   Allergies  Allergen Reactions  . Celebrex [Celecoxib] Anaphylaxis  . Morphine And Related Swelling    Lips and mouth  . Povidone-Iodine Other (See Comments)    Other reaction(s): ITCHING Other reaction(s): ITCHING   . Sulfa Antibiotics     VITALS:  Blood pressure 130/63, pulse 63, temperature 98.6 F (37 C), resp. rate 20, height '5\' 3"'$  (1.6 m), weight 71.4 kg, SpO2 97 %.  PHYSICAL EXAMINATION:   Physical Exam  GENERAL:  87 y.o.-year-old patient lying in the bed with no acute distress.  LUNGS: Normal breath sounds bilaterally, no wheezing, rales, rhonchi. No use of accessory muscles of respiration.  CARDIOVASCULAR: S1, S2 normal. No murmurs, rubs, or gallops.  ABDOMEN: Soft, nontender, + distended. Bowel sounds present. No  organomegaly or mass. Ileostomy+ EXTREMITIES: No cyanosis, clubbing or edema b/l.    NEUROLOGIC: non focal PSYCHIATRIC:  patient is alert and oriented x 3.  SKIN: No obvious rash, lesion, or ulcer.   LABORATORY PANEL:  CBC Recent Labs  Lab 04/07/21 0534  WBC 11.6*  HGB 8.7*  HCT 28.0*  PLT 291     Chemistries  Recent Labs  Lab 04/01/21 0857 04/02/21 0728 04/07/21 0534  NA 138   < > 140  K 2.7*   < > 2.6*  CL 102   < > 102  CO2 25   < > 28  GLUCOSE 129*   < > 106*  BUN 9   < > 7*  CREATININE 0.62   < > 0.57  CALCIUM 9.1   < > 8.0*  MG 2.1  --   --   AST 18  --   --   ALT 8  --   --   ALKPHOS 70  --   --   BILITOT 0.8  --   --    < > = values in this interval not displayed.    Cardiac Enzymes No results for input(s): TROPONINI in the last 168 hours. RADIOLOGY:  No results found. ASSESSMENT AND PLAN:  Gabriela [name] is a 87 y.o. female with medical history significant of hypertension, stroke, TIA, GERD, hypothyroidism, depression with anxiety, hard of hearing, hiatal hernia, atrial fibrillation on Coumadin, breast cancer, bladder cancer, anemia, who presents with abdominal pain and constipation.   Abdominal pain/Large bowel obstruction (partial) due to Colon mass worrisome for malignancy -- CT scan findings  are concerning for thickening of the distal transverse colon with proximal dilation. Cannot r/o obstructing malignancy.  --Dr. Alice Reichert of GI is consulted--> recommend Laxative regimen and not recommend colonoscopy.  --Surgery consult with Dr. Christian Mate --> for colectomy and ?colostomy on 9/9.  -clear diet now, and NPO at midnight on Thursday  -prn Zofran for nausea and fentanyl for pain -IV fluid: 75 cc/h for normal CD --9/9-- since patient was having several loose bowel movements surgery was held in case patient would better benefit from colonoscopy how were patient was evaluated by Dr. Alice Reichert and he recommends surgery. Colonoscopy is relative contraindicated in  this case. -- 9/10--Barium enema--showed narrow transition in distal transverse colon. Surgery planned for Monday --9/11-- check INR. If <2.0 will start heparin gtt pre-operatively. D/w dr Lysle Pearl --9/12--surgery at 3 pm.  --9/13--POD#1 Subtotal colectomy with illeostomy. No heparin gtt for 48 hours. Will d/w surgery tomorrow if heparin +warfarin be started   Hypertension -IV hydralazine as needed -Amlodipine, Cardizem   Hypothyroidism -Synthroid   Stroke [name]): Patient is on Coumadin for A. fib -Hold Coumadin --INR 2.5--hold IV heparin bridge for now--will likely need it post-op if INR is less than two --d/w pt's dter, and surgery --9/9-- INR 2.5 --9/10--INR 2.2 --9/11--INR 1.6--heparin bridge started --9/12-- heparin gtt to be held at 10 am today --9/13--NO heparin today   Hypokalemia: Potassium 2.6. Pharmacy to replete   Depression with anxiety -Continue home medications   Atrial fibrillation, chronic (HCC) -Hold Coumadin -Cardizem --cardiology consult for pre-op clearance with dr Clayborn Bigness noted   Normocytic anemia: Hemoglobin stable, 10.3 today, 0.8 on 12/09/2019 -Follow-up with CBC   DVT ppx: SCD Code Status: DNR   Family Communication: Daughter [name] on the phone Disposition Plan:  Anticipate discharge back to previous environment Consults called:  Dr. Alice Reichert of GI and Dr. Christian Mate  Admission status and Level of care: Med-Surg:          Status is: Inpatient   Remains inpatient appropriate because:Inpatient level of care appropriate due to severity of illness   Dispo: The patient is from: Home              Anticipated d/c is to:  to be determined              Patient currently is not medically stable to d/c.              Difficult to place patient No   Planned to colon surgery in am  TOTAL TIME TAKING CARE OF THIS PATIENT: 25 minutes.  >50% time spent on counselling and coordination of care  Note: This dictation was prepared with Dragon dictation along  with smaller phrase technology. Any transcriptional errors that result from this process are unintentional.  Fritzi Mandes M.D    [facility]   CC: Primary care physician; [name], MD Patient ID: Gabriela [name], female   DOB: 01-12-1932, 87 y.o.   MRN: [id number]

## 2021-04-07 NOTE — Progress Notes (Signed)
[facility]    SUBJECTIVE: Doing reasonably well postop denies palpitation tachycardia denies significant pain.   Vitals:   04/07/21 0403 04/07/21 0407 04/07/21 0425 04/07/21 0738  BP: (!) 141/76  107/71 130/63  Pulse: 65  (!) 57 63  Resp: '16  16 20  '$ Temp: 97.6 F (36.4 C)  (!) 97.5 F (36.4 C) 98.6 F (37 C)  TempSrc:   Oral   SpO2: 96%  99% 97%  Weight:  71.4 kg    Height:         Intake/Output Summary (Last 24 hours) at 04/07/2021 1253 Last data filed at 04/07/2021 0500 Gross per 24 hour  Intake 2298.59 ml  Output 1200 ml  Net 1098.59 ml      PHYSICAL EXAM Patient seen and examined by nurse practitioner General: Well developed, well nourished, in no acute distress HEENT:  Normocephalic and atramatic Neck:  No JVD.  Lungs: Clear bilaterally to auscultation and percussion. Heart: HRRR . Normal S1 and S2 without gallops or murmurs.  Abdomen: Bowel sounds are positive, abdomen soft and non-tender  Msk:  Back normal, normal gait. Normal strength and tone for age. Extremities: No clubbing, cyanosis or edema.   Neuro: Alert and oriented X 3. Psych:  Good affect, responds appropriately   LABS: Basic Metabolic Panel: Recent Labs    04/07/21 0534  NA 140  K 2.6*  CL 102  CO2 28  GLUCOSE 106*  BUN 7*  CREATININE 0.57  CALCIUM 8.0*   Liver Function Tests: No results for input(s): AST, ALT, ALKPHOS, BILITOT, PROT, ALBUMIN in the last 72 hours. No results for input(s): LIPASE, AMYLASE in the last 72 hours. CBC: Recent Labs    04/06/21 0426 04/07/21 0534  WBC 5.5 11.6*  HGB 9.2* 8.7*  HCT 29.6* 28.0*  MCV 82.0 82.4  PLT 314 291   Cardiac Enzymes: No results for input(s): CKTOTAL, CKMB, CKMBINDEX, TROPONINI in the last 72 hours. BNP: Invalid input(s): POCBNP D-Dimer: No results for input(s): DDIMER in the last 72 hours. Hemoglobin A1C: No results for input(s): HGBA1C in the last 72 hours. Fasting Lipid Panel: No results for input(s): CHOL, HDL,  LDLCALC, TRIG, CHOLHDL, LDLDIRECT in the last 72 hours. Thyroid Function Tests: No results for input(s): TSH, T4TOTAL, T3FREE, THYROIDAB in the last 72 hours.  Invalid input(s): FREET3 Anemia Panel: No results for input(s): VITAMINB12, FOLATE, FERRITIN, TIBC, IRON, RETICCTPCT in the last 72 hours.  No results found.   Echo prostatic aortic valve mechanical  TELEMETRY: Atrial fibrillation rate of 75-80:  ASSESSMENT AND PLAN:  Principal Problem:   Abdominal pain Active Problems:   Hypertension   Hypothyroidism   Stroke (HCC)   Hypokalemia   Depression with anxiety   Atrial fibrillation, chronic (HCC)   Normocytic anemia    Plan Patient doing reasonably well. Will resume heparin and Coumadin when cleared by surgeon to do so Will bridge heparin with Coumadin for 48 -72 hours  Continue hypertension management and control Severe hypokalemia we will try to correct electrolytes Atrial fibrillation continue rate control Patient appears to be improving well Recommend conservative cardiac input at this point   Yolonda Kida, MD, 04/07/2021 12:53 PM

## 2021-04-08 ENCOUNTER — Encounter: Payer: Self-pay | Admitting: Internal Medicine

## 2021-04-08 ENCOUNTER — Inpatient Hospital Stay: Payer: Medicare PPO

## 2021-04-08 ENCOUNTER — Other Ambulatory Visit: Payer: Self-pay | Admitting: Surgery

## 2021-04-08 DIAGNOSIS — I482 Chronic atrial fibrillation, unspecified: Secondary | ICD-10-CM | POA: Diagnosis not present

## 2021-04-08 DIAGNOSIS — K56609 Unspecified intestinal obstruction, unspecified as to partial versus complete obstruction: Secondary | ICD-10-CM | POA: Diagnosis not present

## 2021-04-08 DIAGNOSIS — F418 Other specified anxiety disorders: Secondary | ICD-10-CM | POA: Diagnosis not present

## 2021-04-08 DIAGNOSIS — Z433 Encounter for attention to colostomy: Secondary | ICD-10-CM

## 2021-04-08 DIAGNOSIS — E876 Hypokalemia: Secondary | ICD-10-CM | POA: Diagnosis not present

## 2021-04-08 HISTORY — DX: Unspecified intestinal obstruction, unspecified as to partial versus complete obstruction: K56.609

## 2021-04-08 LAB — BASIC METABOLIC PANEL
Anion gap: 8 (ref 5–15)
BUN: 14 mg/dL (ref 8–23)
CO2: 27 mmol/L (ref 22–32)
Calcium: 8.2 mg/dL — ABNORMAL LOW (ref 8.9–10.3)
Chloride: 103 mmol/L (ref 98–111)
Creatinine, Ser: 0.68 mg/dL (ref 0.44–1.00)
GFR, Estimated: >60 mL/min (ref >60)
Glucose, Bld: 90 mg/dL (ref 70–99)
Potassium: 3.3 mmol/L — ABNORMAL LOW (ref 3.5–5.1)
Sodium: 138 mmol/L (ref 135–145)

## 2021-04-08 LAB — CBC
HCT: 29.4 % — ABNORMAL LOW (ref 36.0–46.0)
Hemoglobin: 9.1 g/dL — ABNORMAL LOW (ref 12.0–15.0)
MCH: 25.6 pg — ABNORMAL LOW (ref 26.0–34.0)
MCHC: 31 g/dL (ref 30.0–36.0)
MCV: 82.8 fL (ref 80.0–100.0)
Platelets: 294 10*3/uL (ref 150–400)
RBC: 3.55 MIL/uL — ABNORMAL LOW (ref 3.87–5.11)
RDW: 14.5 % (ref 11.5–15.5)
WBC: 23 10*3/uL — ABNORMAL HIGH (ref 4.0–10.5)
nRBC: 0 % (ref 0.0–0.2)

## 2021-04-08 LAB — HEPARIN LEVEL (UNFRACTIONATED): Heparin Unfractionated: <0.1 IU/mL — ABNORMAL LOW (ref 0.30–0.70)

## 2021-04-08 LAB — PROTIME-INR
INR: 1.4 — ABNORMAL HIGH (ref 0.8–1.2)
Prothrombin Time: 17.5 seconds — ABNORMAL HIGH (ref 11.4–15.2)

## 2021-04-08 LAB — MAGNESIUM: Magnesium: 2 mg/dL (ref 1.7–2.4)

## 2021-04-08 LAB — GLUCOSE, CAPILLARY: Glucose-Capillary: 88 mg/dL (ref 70–99)

## 2021-04-08 MED ORDER — POTASSIUM CHLORIDE 10 MEQ/100ML IV SOLN
10.0000 meq | INTRAVENOUS | Status: AC
Start: 2021-04-08 — End: 2021-04-08
  Administered 2021-04-08 (×4): 10 meq via INTRAVENOUS
  Filled 2021-04-08 (×4): qty 100

## 2021-04-08 MED ORDER — HEPARIN (PORCINE) 25000 UT/250ML-% IV SOLN
1500.0000 [IU]/h | INTRAVENOUS | Status: DC
  Administered 2021-04-08: 1000 [IU]/h via INTRAVENOUS
  Administered 2021-04-09: 1500 [IU]/h via INTRAVENOUS
  Administered 2021-04-09: 1200 [IU]/h via INTRAVENOUS
  Filled 2021-04-08 (×3): qty 250

## 2021-04-08 MED ORDER — HEPARIN BOLUS VIA INFUSION
2000.0000 [IU] | Freq: Once | INTRAVENOUS | Status: AC
  Administered 2021-04-09: 2000 [IU] via INTRAVENOUS
  Filled 2021-04-08: qty 2000

## 2021-04-08 MED ORDER — BOOST / RESOURCE BREEZE PO LIQD CUSTOM
1.0000 | Freq: Three times a day (TID) | ORAL | Status: DC
  Administered 2021-04-08 – 2021-04-09 (×3): 1 via ORAL

## 2021-04-08 MED ORDER — POTASSIUM CHLORIDE 20 MEQ PO PACK
40.0000 meq | PACK | Freq: Once | ORAL | Status: AC
  Administered 2021-04-08: 40 meq via ORAL
  Filled 2021-04-08: qty 2

## 2021-04-08 MED ORDER — WARFARIN - PHARMACIST DOSING INPATIENT: Freq: Every day | Status: DC

## 2021-04-08 MED ORDER — WARFARIN SODIUM 5 MG PO TABS
5.0000 mg | ORAL_TABLET | Freq: Once | ORAL | Status: AC
  Administered 2021-04-08: 5 mg via ORAL
  Filled 2021-04-08: qty 1

## 2021-04-08 NOTE — Consult Note (Signed)
[place] for heparin infusion bridge with warfarin Indication: atrial fibrillation  Allergies  Allergen Reactions   Celebrex [Celecoxib] Anaphylaxis   Morphine And Related Swelling    Lips and mouth   Povidone-Iodine Other (See Comments)    Other reaction(s): ITCHING Other reaction(s): ITCHING    Sulfa Antibiotics     Patient Measurements: Height: '5\' 3"'$  (160 cm) Weight: 72.5 kg (159 lb 13.3 oz) IBW/kg (Calculated) : 52.4 Heparin Dosing Weight: 65.9 kg  Vital Signs: Temp: 98.9 F (37.2 C) (09/14 1943) Temp Source: Oral (09/14 1943) BP: 145/59 (09/14 1943) Pulse Rate: 104 (09/14 1943)  Labs: Recent Labs    04/06/21 0426 04/07/21 0534 04/08/21 0325 04/08/21 1155 04/08/21 2250  HGB 9.2* 8.7* 9.1*  --   --   HCT 29.6* 28.0* 29.4*  --   --   PLT 314 291 294  --   --   LABPROT 19.0*  --   --  17.5*  --   INR 1.6*  --   --  1.4*  --   HEPARINUNFRC 0.42  --   --   --  <0.10*  CREATININE  --  0.57 0.68  --   --      Estimated Creatinine Clearance: 47.2 mL/min (by C-G formula based on SCr of 0.68 mg/dL).   Medical History: Past Medical History:  Diagnosis Date   Acid reflux    Anxiety    Arrhythmia    Bladder cancer ([place])    Breast cancer ([place])    Cancer ([place])    breast,bladder   Depression    Dysrhythmia    afib   GERD (gastroesophageal reflux disease)    History of hiatal hernia    HOH (hard of hearing)    aids   Hypertension    Hyperthyroidism    Hypothyroidism    Stroke ([facility])    2010   TIA (transient ischemic attack)     Medications:  Scheduled:   amLODipine  5 mg Oral Daily   bisacodyl  10 mg Rectal Once   Chlorhexidine Gluconate Cloth  6 each Topical Daily   feeding supplement  1 Container Oral TID BM   [START ON 04/09/2021] heparin  2,000 Units Intravenous Once   irbesartan  300 mg Oral Daily   levothyroxine  112 mcg Oral QAC breakfast   multivitamin with minerals  1 tablet Oral Daily    pantoprazole  40 mg Oral Daily   polyethylene glycol  17 g Oral Daily   sertraline  50 mg Oral QHS   Warfarin - Pharmacist Dosing Inpatient   Does not apply q1600    Assessment: 87 y.o. female with medical history significant of hypertension, stroke, TIA, GERD, hypothyroidism, depression with anxiety, hard of hearing, hiatal hernia, atrial fibrillation on Coumadin, breast cancer, bladder cancer, anemia, who presents with abdominal pain and constipation. Pharmacy has been consulted for heparin bridge and warfarin. Baseline CBC was within normal limits.   PTA warfarin regimen: 3 mg on Sat/Sun/Mon/Tue; 4 mg on Wed/Thur/Fri - last dose was 3 mg on 9/6  Goal of Therapy:  INR 2-3 Heparin level 0.3-0.7 units/ml Monitor platelets by anticoagulation protocol: Yes  Warfarin: Date INR Comment  9/14 1.4 Subtherapeutic   Heparin: Date Time HL Rate/Comment  9/14 2250 <0.10 Subtherapeutic 1000 > 1200 units/hr   Plan:  Heparin Heparin subtherapeutic  Heparin bolus 2000 units x 1 Increase heparin infusion to 1200 units/hr Check HL in 8 hours following rate change  Monitor CBC per protocol Stop heparin once INR at goal  Warfarin INR subtherapeutic as pt has not had a dose since 9/6. Will order 5 mg warfarin tonight Daily INR checks Monitor CBC per protocol   Dorothe Pea, PharmD, BCPS Clinical Pharmacist   04/08/2021 11:38 PM

## 2021-04-08 NOTE — Progress Notes (Addendum)
Progress Note    Gabriela [name]  [id number] DOB: 04-Nov-1931  DOA: 04/01/2021 PCP: [name], MD      Brief Narrative:    Medical records reviewed and are as summarized below:  Gabriela [name] is a 87 y.o. female with medical history significant for hypertension, TIA, stroke, CAD, hypothyroidism, depression, anxiety, hearing impairment, hiatal hernia, atrial fibrillation on Coumadin, breast cancer, bladder cancer, chronic anemia.  She presented to the hospital with abdominal pain and constipation.  She she was found to have large bowel obstruction secondary to colonic mass concerning for malignancy.  She was treated with IV fluids and analgesics.  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       Assessment/Plan:   Principal Problem:   Large bowel obstruction (HCC) Active Problems:   Abdominal pain   Hypertension   Hypothyroidism   Stroke ([place])   Hypokalemia   Depression with anxiety   Atrial fibrillation, chronic (HCC)   Normocytic anemia   Nutrition Problem: Inadequate oral intake Etiology: acute illness  Signs/Symptoms: other (comment) (pt on clear liquid diet)   Body mass index is 28.31 kg/m.   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  Analgesics as needed for pain.  Extensive soft tissue emphysema and pneumoperitoneum in the setting of recent laparoscopic partial colectomy: Follow-up with general surgeon.  Leukocytosis: This is likely reactive from surgery.  Monitor CBC.  Possible pleural fluid noted on abdominal x-ray.  Obtain chest x-ray for further evaluation.  She is tolerating room air.  Atrial fibrillation and history of stroke: Case discussed with Dr. Christian Mate, general surgeon and Aldona Bar (pharmacist).  He said it is okay to start IV heparin infusion and Coumadin.  Monitor heparin level and INR  per protocol.  CHA2DS2-VASc score is 6.    Hypokalemia: Replete potassium and monitor levels.  Other comorbidities include depression, anxiety, chronic anemia, hypothyroidism, hypertension     Diet Order             Diet clear liquid Room service appropriate? Yes; Fluid consistency: Thin  Diet effective now                      Consultants: General surgeon  Procedures: Robotic assisted subtotal colectomy with end ileostomy on 04/06/2021    Medications:    amLODipine  5 mg Oral Daily   bisacodyl  10 mg Rectal Once   Chlorhexidine Gluconate Cloth  6 each Topical Daily   feeding supplement  1 Container Oral TID BM   irbesartan  300 mg Oral Daily   levothyroxine  112 mcg Oral QAC breakfast   multivitamin with minerals  1 tablet Oral Daily   pantoprazole  40 mg Oral Daily   polyethylene glycol  17 g Oral Daily   sertraline  50 mg Oral QHS   Continuous Infusions:  heparin     potassium chloride 10 mEq (04/08/21 1216)     Anti-infectives (From admission, onward)    Start     Dose/Rate Route Frequency Ordered Stop   04/06/21 1320  sodium chloride 0.9 % with cefoTEtan (CEFOTAN) ADS Med       Note to Pharmacy: Maryagnes Amos   : cabinet override      04/06/21 1320 04/06/21 1457   04/06/21 0830  cefoTEtan (CEFOTAN) 2 g in sodium chloride 0.9 % 100 mL IVPB  2 g 200 mL/hr over 30 Minutes Intravenous On call to O.R. 04/06/21 0740 04/06/21 1452              Family Communication/Anticipated D/C date and plan/Code Status   DVT prophylaxis: SCDs Start: 04/01/21 1207     Code Status: DNR  Family Communication: None Disposition Plan:    Status is: Inpatient  Remains inpatient appropriate because:IV treatments appropriate due to intensity of illness or inability to take PO and Inpatient level of care appropriate due to severity of illness  Dispo: The patient is from: Home              Anticipated d/c is to: Home              Patient  currently is not medically stable to d/c.   Difficult to place patient No           Subjective:   Interval events noted.  She complains of abdominal pain.  No vomiting.  No shortness of breath or chest pain.  Objective:    Vitals:   04/07/21 1928 04/08/21 0500 04/08/21 0505 04/08/21 0736  BP: (!) 122/52  116/61 (!) 110/52  Pulse: 100  (!) 105 (!) 101  Resp: '20  16 18  '$ Temp: 99.5 F (37.5 C)  99.9 F (37.7 C) 98.3 F (36.8 C)  TempSrc: Oral  Oral Oral  SpO2: 93%  95% (!) 89%  Weight:  72.5 kg    Height:       No data found.   Intake/Output Summary (Last 24 hours) at 04/08/2021 1240 Last data filed at 04/08/2021 1012 Gross per 24 hour  Intake 1379.29 ml  Output 400 ml  Net 979.29 ml   Filed Weights   04/06/21 1259 04/07/21 0407 04/08/21 0500  Weight: 67 kg 71.4 kg 72.5 kg    Exam:  GEN: NAD SKIN: Warm and dry EYES: EOMI ENT: MMM CV: RRR PULM: CTA B ABD: soft, ND, NT, +BS, +ileostomy with dark green fluid in the ostomy bag CNS: AAO x 3, non focal EXT: No edema or tenderness        Data Reviewed:   I have personally reviewed following labs and imaging studies:  Labs: Labs show the following:   Basic Metabolic Panel: Recent Labs  Lab 04/02/21 0728 04/07/21 0534 04/07/21 1806 04/08/21 0325  NA 137 140  --  138  K 3.5 2.6* 2.8* 3.3*  CL 104 102  --  103  CO2 24 28  --  27  GLUCOSE 100* 106*  --  90  BUN 8 7*  --  14  CREATININE 0.56 0.57  --  0.68  CALCIUM 8.2* 8.0*  --  8.2*  MG  --   --   --  2.0   GFR Estimated Creatinine Clearance: 47.2 mL/min (by C-G formula based on SCr of 0.68 mg/dL). Liver Function Tests: No results for input(s): AST, ALT, ALKPHOS, BILITOT, PROT, ALBUMIN in the last 168 hours. No results for input(s): LIPASE, AMYLASE in the last 168 hours. No results for input(s): AMMONIA in the last 168 hours. Coagulation profile Recent Labs  Lab 04/03/21 0755 04/04/21 0406 04/05/21 1128 04/06/21 0426 04/08/21 1155   INR 2.5* 2.2* 1.6* 1.6* 1.4*    CBC: Recent Labs  Lab 04/02/21 0728 04/05/21 1255 04/06/21 0426 04/07/21 0534 04/08/21 0325  WBC 6.0 6.2 5.5 11.6* 23.0*  HGB 9.2* 9.8* 9.2* 8.7* 9.1*  HCT 28.8* 30.3* 29.6* 28.0* 29.4*  MCV 82.3 82.6 82.0  82.4 82.8  PLT 291 329 314 291 294   Cardiac Enzymes: No results for input(s): CKTOTAL, CKMB, CKMBINDEX, TROPONINI in the last 168 hours. BNP (last 3 results) No results for input(s): PROBNP in the last 8760 hours. CBG: Recent Labs  Lab 04/02/21 0818 04/03/21 0752 04/04/21 0809 04/07/21 0737 04/08/21 0808  GLUCAP 103* 96 87 102* 88   D-Dimer: No results for input(s): DDIMER in the last 72 hours. Hgb A1c: No results for input(s): HGBA1C in the last 72 hours. Lipid Profile: No results for input(s): CHOL, HDL, LDLCALC, TRIG, CHOLHDL, LDLDIRECT in the last 72 hours. Thyroid function studies: No results for input(s): TSH, T4TOTAL, T3FREE, THYROIDAB in the last 72 hours.  Invalid input(s): FREET3 Anemia work up: No results for input(s): VITAMINB12, FOLATE, FERRITIN, TIBC, IRON, RETICCTPCT in the last 72 hours. Sepsis Labs: Recent Labs  Lab 04/05/21 1255 04/06/21 0426 04/07/21 0534 04/08/21 0325  WBC 6.2 5.5 11.6* 23.0*    Microbiology Recent Results (from the past 240 hour(s))  Resp Panel by RT-PCR (Flu A&B, Covid) Nasopharyngeal Swab     Status: None   Collection Time: 04/01/21  2:04 PM   Specimen: Nasopharyngeal Swab; Nasopharyngeal(NP) swabs in vial transport medium  Result Value Ref Range Status   SARS Coronavirus 2 by RT PCR NEGATIVE NEGATIVE Final    Comment: (NOTE) SARS-CoV-2 target nucleic acids are NOT DETECTED.  The SARS-CoV-2 RNA is generally detectable in upper respiratory specimens during the acute phase of infection. The lowest concentration of SARS-CoV-2 viral copies this assay can detect is 138 copies/mL. A negative result does not preclude SARS-Cov-2 infection and should not be used as the sole basis for  treatment or other patient management decisions. A negative result may occur with  improper specimen collection/handling, submission of specimen other than nasopharyngeal swab, presence of viral mutation(s) within the areas targeted by this assay, and inadequate number of viral copies(<138 copies/mL). A negative result must be combined with clinical observations, patient history, and epidemiological information. The expected result is Negative.  Fact Sheet for Patients:  EntrepreneurPulse.com.au  Fact Sheet for Healthcare Providers:  IncredibleEmployment.be  This test is no t yet approved or cleared by the [place] FDA and  has been authorized for detection and/or diagnosis of SARS-CoV-2 by FDA under an Emergency Use Authorization (EUA). This EUA will remain  in effect (meaning this test can be used) for the duration of the COVID-19 declaration under Section 564(b)(1) of the Act, 21 U.S.C.section 360bbb-3(b)(1), unless the authorization is terminated  or revoked sooner.       Influenza A by PCR NEGATIVE NEGATIVE Final   Influenza B by PCR NEGATIVE NEGATIVE Final    Comment: (NOTE) The Xpert Xpress SARS-CoV-2/FLU/RSV plus assay is intended as an aid in the diagnosis of influenza from Nasopharyngeal swab specimens and should not be used as a sole basis for treatment. Nasal washings and aspirates are unacceptable for Xpert Xpress SARS-CoV-2/FLU/RSV testing.  Fact Sheet for Patients: EntrepreneurPulse.com.au  Fact Sheet for Healthcare Providers: IncredibleEmployment.be  This test is not yet approved or cleared by the [place] FDA and has been authorized for detection and/or diagnosis of SARS-CoV-2 by FDA under an Emergency Use Authorization (EUA). This EUA will remain in effect (meaning this test can be used) for the duration of the COVID-19 declaration under Section 564(b)(1) of the Act, 21  U.S.C. section 360bbb-3(b)(1), unless the authorization is terminated or revoked.  Performed at [facility], [address], [place], [place] [address]  MRSA Next Gen by PCR, Nasal     Status: None   Collection Time: 04/04/21  5:56 AM   Specimen: Nasal Mucosa; Nasal Swab  Result Value Ref Range Status   MRSA by PCR Next Gen NOT DETECTED NOT DETECTED Final    Comment: (NOTE) The GeneXpert MRSA Assay (FDA approved for NASAL specimens only), is one component of a comprehensive MRSA colonization surveillance program. It is not intended to diagnose MRSA infection nor to guide or monitor treatment for MRSA infections. Test performance is not FDA approved in patients less than 31 years old. Performed at [facility], [address] [place], [place] [address]     Procedures and diagnostic studies:  DG Abd Portable 1V  Result Date: 04/08/2021 CLINICAL DATA:  Abdominal pain and ileus EXAM: PORTABLE ABDOMEN - 1 VIEW COMPARISON:  Barium enema from 5 days ago FINDINGS: Extensive soft tissue emphysema throughout the abdominal wall. Rigler sign seen in the ventral abdomen. Suspect pleural fluid at the bases with lucent central lower chest, presumed extension of gas. Mild retained barium within diverticula of the distal sigmoid. A few distended small bowel loops without generalized distention for ileus. These results were called by telephone at the time of interpretation on 04/08/2021 at 10:17 am to provider [facility] , who verbally acknowledged these results. IMPRESSION: 1. Extensive soft tissue emphysema and pneumoperitoneum in the setting of recent laparoscopic partial colectomy. 2. Air leak continues over the lower chest with possible pleural fluid, chest radiography is suggested. Electronically Signed   By: Jorje Guild M.D.   On: 04/08/2021 10:17               LOS: 7 days   Bonita Brindisi  [facility]   Pager on [email or web address]. If 7PM-7AM, please  contact night-coverage at [email or web address]     04/08/2021, 12:40 PM

## 2021-04-08 NOTE — NC FL2 (Signed)
[place] LEVEL OF CARE SCREENING TOOL     IDENTIFICATION  Patient Name: Gabriela [name] Birthdate: [birth year] Sex: female Admission Date (Current Location): 04/01/2021  [facility] and [place] Number:  Engineering geologist and Address:  [facility], [address], [place], [place] [address]      Provider Number: [id number]  Attending Physician Name and Address:  Jennye Boroughs, MD  Relative Name and Phone Number:       Current Level of Care: Hospital Recommended Level of Care: [place] Prior Approval Number:    Date Approved/Denied:   PASRR Number: [id number]  Discharge Plan: SNF    Current Diagnoses: Patient Active Problem List   Diagnosis Date Noted   Large bowel obstruction ([place]) 04/08/2021   Abdominal pain 04/01/2021   Hypertension    Hypothyroidism    Stroke ([place])    Hypokalemia    Depression with anxiety    Atrial fibrillation, chronic (HCC)    Normocytic anemia    Sacroiliitis ([place]) 07/12/2016   Pain in right hip 07/12/2016    Orientation RESPIRATION BLADDER Height & Weight     Self, Time, Situation, Place  Normal Continent Weight: 159 lb 13.3 oz (72.5 kg) Height:  '5\' 3"'$  (160 cm)  BEHAVIORAL SYMPTOMS/MOOD NEUROLOGICAL BOWEL NUTRITION STATUS   (None)  (History of stroke) Ileostomy, Continent Diet (Full liquid. Follow for discharge recommendations.)  AMBULATORY STATUS COMMUNICATION OF NEEDS Skin   Limited Assist Verbally Surgical wounds (Incision on abdomen (dermabond).)                       Personal Care Assistance Level of Assistance  Bathing, Feeding, Dressing Bathing Assistance: Limited assistance Feeding assistance: Limited assistance Dressing Assistance: Limited assistance     Functional Limitations Info  Sight, Hearing, Speech Sight Info: Adequate Hearing Info: Adequate Speech Info: Adequate    SPECIAL CARE FACTORS FREQUENCY  PT (By licensed PT), OT (By licensed OT)     PT  Frequency: 5 x week OT Frequency: 5 x week            Contractures Contractures Info: Not present    Additional Factors Info  Code Status, Allergies, Psychotropic Code Status Info: DNR Allergies Info: Celebrex (Celecoxib), Morphine and related, Povidone-iodine, Sulfa antibiotics Psychotropic Info: Anxiety, depression         Current Medications (04/08/2021):  This is the current hospital active medication list Current Facility-Administered Medications  Medication Dose Route Frequency Provider Last Rate Last Admin   acetaminophen (TYLENOL) tablet 650 mg  650 mg Oral Q6H PRN Ronny Bacon, MD   650 mg at 04/07/21 0818   albuterol (PROVENTIL) (2.5 MG/3ML) 0.083% nebulizer solution 2.5 mg  2.5 mg Nebulization Q4H PRN Ronny Bacon, MD       ALPRAZolam Duanne Moron) tablet 0.125 mg  0.125 mg Oral QHS PRN Ronny Bacon, MD   0.125 mg at 04/04/21 2232   alum & mag hydroxide-simeth (MAALOX/MYLANTA) 200-200-20 MG/5ML suspension 30 mL  30 mL Oral Q6H PRN Fritzi Mandes, MD   30 mL at 04/07/21 1557   amLODipine (NORVASC) tablet 5 mg  5 mg Oral Daily Ronny Bacon, MD   5 mg at 04/08/21 0843   bisacodyl (DULCOLAX) suppository 10 mg  10 mg Rectal Once Ronny Bacon, MD       Chlorhexidine Gluconate Cloth 2 % PADS 6 each  6 each Topical Daily Ronny Bacon, MD   6 each at 04/08/21 0907   feeding supplement (  BOOST / RESOURCE BREEZE) liquid 1 Container  1 Container Oral TID BM Jennye Boroughs, MD       heparin ADULT infusion 100 units/mL (25000 units/229m)  1,000 Units/hr Intravenous Continuous Rauer, SForde Dandy RPH       HYDROmorphone (DILAUDID) injection 0.5-1 mg  0.5-1 mg Intravenous Q3H PRN PFritzi Mandes MD   1 mg at 04/08/21 0957   irbesartan (AVAPRO) tablet 300 mg  300 mg Oral Daily RRonny Bacon MD   300 mg at 04/08/21 [id number]  levothyroxine (SYNTHROID) tablet 112 mcg  112 mcg Oral QAC breakfast RRonny Bacon MD   112 mcg at 04/08/21 0505   meclizine (ANTIVERT) tablet 25 mg  25  mg Oral TID PRN RRonny Bacon MD       multivitamin with minerals tablet 1 tablet  1 tablet Oral Daily RRonny Bacon MD   1 tablet at 04/08/21 0833   ondansetron (ZOFRAN) injection 4 mg  4 mg Intravenous Q8H PRN RRonny Bacon MD   4 mg at 04/02/21 1333   pantoprazole (PROTONIX) EC tablet 40 mg  40 mg Oral Daily RRonny Bacon MD   40 mg at 04/08/21 0834   polyethylene glycol (MIRALAX / GLYCOLAX) packet 17 g  17 g Oral Daily TOlean ReeK, MD   17 g at 04/08/21 0945   potassium chloride 10 mEq in 100 mL IVPB  10 mEq Intravenous Q1 Hr x 4 Rauer, SForde Dandy RPH 100 mL/hr at 04/08/21 1216 10 mEq at 04/08/21 1216   sertraline (ZOLOFT) tablet 50 mg  50 mg Oral QHS RRonny Bacon MD   50 mg at 04/07/21 1959     Discharge Medications: Please see discharge summary for a list of discharge medications.  Relevant Imaging Results:  Relevant Lab Results:   Additional Information SS#: [id number] Has had two COVID vaccines and one booster. Patient interested in potential ALF placement after rehab.  SCandie Chroman LCSW

## 2021-04-08 NOTE — Progress Notes (Signed)
Physical Therapy Evaluation Patient Details Name: Gabriela [name] MRN: [id number] DOB: [birth year] Today's Date: 04/08/2021  History of Present Illness  Gabriela [name] is a 87 y.o. female with medical history significant of hypertension, stroke, TIA, GERD, hypothyroidism, depression with anxiety, hard of hearing, hiatal hernia, atrial fibrillation on Coumadin, breast cancer, bladder cancer, anemia, who presents with abdominal pain and constipation. 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   Clinical Impression  Pt is a pleasant 87 year old female who presents to PT evaluation post-op day #2 laparoscopic colectomy with end ileostomy. Pt reports being independent at baseline with all mobility and ADLs. Pt currently requiring modA for bed mobility, transfers, and ambulation due to posterior lean in sitting and standing. Pt demonstrating impaired balance, strength, pain, and decreased activity tolerance. PT recommending SNF placement for patient to be able to return to prior level of function. Pt will benefit from further PT services to improve activity tolerance, reduce risk of falls, and improve independence.   Recommendations for follow up therapy are one component of a multi-disciplinary discharge planning process, led by the attending physician.  Recommendations may be updated based on patient status, additional functional criteria and insurance authorization.  Follow Up Recommendations SNF    Equipment Recommendations  None recommended by PT    Recommendations for Other Services       Precautions / Restrictions Precautions Precautions: Fall Restrictions Weight Bearing Restrictions: No      Mobility  Bed Mobility Overal bed mobility: Needs Assistance Bed Mobility: Supine to Sit     Supine to sit: Mod assist     General bed mobility comments: Assistance with B LEs off EOB and assistance with pulling  to sit with bedrails    Transfers Overall transfer level: Needs assistance Equipment used: Rolling walker (2 wheeled) Transfers: Sit to/from Stand Sit to Stand: Mod assist         General transfer comment: ModA for buttock clearance and verbal cues for forward trunk lean. Pt demonstrating posterior lean in standing and cued to weight shift forward with some success  Ambulation/Gait Ambulation/Gait assistance: Min assist Gait Distance (Feet): 2 Feet Assistive device: Rolling walker (2 wheeled) Gait Pattern/deviations: Step-to pattern     General Gait Details: Side stepping towards chair with minA for posterior lean, maxA for positioning of RW, and verbal cue for foot placement for improved safety with transfer.  Stairs            Wheelchair Mobility    Modified Rankin (Stroke Patients Only)       Balance Overall balance assessment: Needs assistance Sitting-balance support: Bilateral upper extremity supported Sitting balance-Leahy Scale: Fair Sitting balance - Comments: Required usage of bed rails to maintain sitting position. With MMT, pt demonstrating posterior lean requiring verbal cues for improved posture   Standing balance support: Bilateral upper extremity supported Standing balance-Leahy Scale: Fair Standing balance comment: B UE on RW. Pt demonstrating posterior lean with initial standing and able to partially correct with anterior WS.         Pertinent Vitals/Pain Pain Assessment: 0-10 Pain Score: 0-No pain (At rest prior to mobility) Pain Location: Abdominal Region Pain Descriptors / Indicators: Grimacing;Guarding Pain Intervention(s): Limited activity within patient's tolerance;Monitored during session;Premedicated before session;Repositioned    Home Living Family/patient expects to be discharged to:: Private residence Living Arrangements: Alone Available Help at Discharge: Family;Available PRN/intermittently Type of Home: House Home Access:  Ramped entrance  Home Layout: One level Home Equipment: Walker - 2 wheels;Cane - single point Additional Comments: Pt reports possible plan to d/c with daughter's home (2-3 steps to enter with rails).    Prior Function Level of Independence: Independent         Comments: Pt reports independence with all ADLs and IADLs and did not ambulate with any AD prior to admission     Hand Dominance        Extremity/Trunk Assessment        Lower Extremity Assessment Lower Extremity Assessment: RLE deficits/detail;LLE deficits/detail RLE Deficits / Details: Grossly 4+/5 MMT with exception to hip flexion 3/5 MMT RLE Sensation: WNL LLE Deficits / Details: Grossly 4+/5 MMT with exception to hip flexion 3/5 MMT LLE Sensation: WNL       Communication   Communication: HOH  Cognition Arousal/Alertness: Awake/alert Behavior During Therapy: WFL for tasks assessed/performed Overall Cognitive Status: Within Functional Limits for tasks assessed        General Comments: A &O x 4      General Comments      Exercises     Assessment/Plan    PT Assessment Patient needs continued PT services  PT Problem List Decreased strength;Decreased range of motion;Decreased activity tolerance;Decreased balance;Decreased mobility;Decreased knowledge of use of DME;Decreased safety awareness;Pain       PT Treatment Interventions DME instruction;Gait training;Functional mobility training;Therapeutic activities;Stair training;Therapeutic exercise;Balance training;Neuromuscular re-education;Patient/family education;Modalities    PT Goals (Current goals can be found in the Care Plan section)  Acute Rehab PT Goals Patient Stated Goal: to return home with daughter PT Goal Formulation: With patient Time For Goal Achievement: 04/22/21 Potential to Achieve Goals: Good    Frequency Min 2X/week   Barriers to discharge        Co-evaluation               AM-PAC PT "6 Clicks" Mobility   Outcome Measure Help needed turning from your back to your side while in a flat bed without using bedrails?: A Lot Help needed moving from lying on your back to sitting on the side of a flat bed without using bedrails?: A Lot Help needed moving to and from a bed to a chair (including a wheelchair)?: A Little Help needed standing up from a chair using your arms (e.g., wheelchair or bedside chair)?: A Little Help needed to walk in hospital room?: A Lot Help needed climbing 3-5 steps with a railing? : Total 6 Click Score: 13    End of Session Equipment Utilized During Treatment: Gait belt Activity Tolerance: Patient tolerated treatment well Patient left: in chair;with call bell/phone within reach;with chair alarm set Nurse Communication: Mobility status PT Visit Diagnosis: Unsteadiness on feet (R26.81);Repeated falls (R29.6);Muscle weakness (generalized) (M62.81)    Time: [id number] PT Time Calculation (min) (ACUTE ONLY): 27 min   Charges:   PT Evaluation $PT Eval Low Complexity: 1 Low PT Treatments $Therapeutic Activity: 8-22 mins        Andrey Campanile, SPT   Andrey Campanile 04/08/2021, 2:43 PM

## 2021-04-08 NOTE — Consult Note (Signed)
[place] for heparin infusion bridge with warfarin Indication: atrial fibrillation  Allergies  Allergen Reactions   Celebrex [Celecoxib] Anaphylaxis   Morphine And Related Swelling    Lips and mouth   Povidone-Iodine Other (See Comments)    Other reaction(s): ITCHING Other reaction(s): ITCHING    Sulfa Antibiotics     Patient Measurements: Height: '5\' 3"'$  (160 cm) Weight: 72.5 kg (159 lb 13.3 oz) IBW/kg (Calculated) : 52.4 Heparin Dosing Weight: 65.9 kg  Vital Signs: Temp: 98.3 F (36.8 C) (09/14 0736) Temp Source: Oral (09/14 0736) BP: 110/52 (09/14 0736) Pulse Rate: 101 (09/14 0736)  Labs: Recent Labs    04/05/21 1255 04/05/21 2052 04/06/21 0426 04/07/21 0534 04/08/21 0325  HGB 9.8*  --  9.2* 8.7* 9.1*  HCT 30.3*  --  29.6* 28.0* 29.4*  PLT 329  --  314 291 294  APTT 36  --   --   --   --   LABPROT  --   --  19.0*  --   --   INR  --   --  1.6*  --   --   HEPARINUNFRC  --  <0.10* 0.42  --   --   CREATININE  --   --   --  0.57 0.68     Estimated Creatinine Clearance: 47.2 mL/min (by C-G formula based on SCr of 0.68 mg/dL).   Medical History: Past Medical History:  Diagnosis Date   Acid reflux    Anxiety    Arrhythmia    Bladder cancer (HCC)    Breast cancer ([place])    Cancer (HCC)    breast,bladder   Depression    Dysrhythmia    afib   GERD (gastroesophageal reflux disease)    History of hiatal hernia    HOH (hard of hearing)    aids   Hypertension    Hyperthyroidism    Hypothyroidism    Stroke ([place])    2010   TIA (transient ischemic attack)     Medications:  Scheduled:   amLODipine  5 mg Oral Daily   bisacodyl  10 mg Rectal Once   Chlorhexidine Gluconate Cloth  6 each Topical Daily   irbesartan  300 mg Oral Daily   levothyroxine  112 mcg Oral QAC breakfast   multivitamin with minerals  1 tablet Oral Daily   pantoprazole  40 mg Oral Daily   polyethylene glycol  17 g Oral Daily   sertraline  50 mg  Oral QHS    Assessment: 87 y.o. female with medical history significant of hypertension, stroke, TIA, GERD, hypothyroidism, depression with anxiety, hard of hearing, hiatal hernia, atrial fibrillation on Coumadin, breast cancer, bladder cancer, anemia, who presents with abdominal pain and constipation. Pharmacy has been consulted for heparin bridge and warfarin. Baseline CBC was within normal limits.   PTA warfarin regimen: 3 mg on Sat/Sun/Mon/Tue; 4 mg on Wed/Thur/Fri - last dose was 3 mg on 9/6  Goal of Therapy:  INR 2-3 Heparin level 0.3-0.7 units/ml Monitor platelets by anticoagulation protocol: Yes  Date INR Comment  9/14 1.4 Subtherapeutic    Plan:  Heparin Per discussion with surgery no heparin bolus, will start heparin drip at 1000 units/hr Follow heparin protocol thereafter Check HL in 8 hours  Monitor CBC per protocol Stop heparin once INR at goal  Warfarin INR subtherapeutic as pt has not had a dose since 9/6. Will order 5 mg warfarin tonight Daily INR checks Monitor CBC per protocol  Sherilyn Banker, PharmD 04/08/2021 11:48 AM

## 2021-04-08 NOTE — Progress Notes (Signed)
Mobility Specialist - Progress Note   04/08/21 1500  Mobility  Activity Refused mobility  Mobility performed by Mobility specialist    Pt lying in bed upon arrival, politely declined requesting to rest at this time. Will attempt session another date/time.    Kathee Delton Mobility Specialist 04/08/21, 3:34 PM

## 2021-04-08 NOTE — Progress Notes (Signed)
[facility] Hospital Day(s): 7.   Post op day(s): 2 Days Post-Op.   Interval History:  Patient seen and examined No acute events or new complaints overnight.  Patient continues to have issues with pain control throughout the day and overnight, incisional, using scheduled 1 mg Dilaudid No fever, chills, nausea, emesis She does have a significant jump in her leukocytosis this morning to 23.0K Hgb is stable at 9.1 Renal function remains normal; sCr - 0.68; UO - 400 Hypokalemia is improving; up to 3.3 She is on CLD; tolerating well  Vital signs in last 24 hours: [min-max] current  Temp:  [98.6 F (37 C)-99.9 F (37.7 C)] 99.9 F (37.7 C) (09/14 0505) Pulse Rate:  [63-105] 105 (09/14 0505) Resp:  [16-20] 16 (09/14 0505) BP: (116-130)/(52-63) 116/61 (09/14 0505) SpO2:  [93 %-97 %] 95 % (09/14 0505) Weight:  [72.5 kg] 72.5 kg (09/14 0500)     Height: '5\' 3"'$  (160 cm) Weight: 72.5 kg BMI (Calculated): 28.32   Intake/Output last 2 shifts:  09/13 0701 - 09/14 0700 In: 599.3 [P.O.:180; IV Piggyback:419.3] Out: 400 [Urine:400]   Physical Exam:  Constitutional: alert, cooperative and no distress  Respiratory: breathing non-labored at rest  Cardiovascular: regular rate and sinus rhythm  Gastrointestinal: Soft, she is somewhat diffusely tender, more distended and tympanic, no rebound/guarding, she does not appear peritonitic. Ileostomy in the right abdomen, dusky/congested, no gas, small amount of liquid stool in bag Integumentary: Laparoscopic incisions are CDI with dermabond, no erythema or drainage   Labs:  CBC Latest Ref Rng & Units 04/08/2021 04/07/2021 04/06/2021  WBC 4.0 - 10.5 K/uL 23.0(H) 11.6(H) 5.5  Hemoglobin 12.0 - 15.0 g/dL 9.1(L) 8.7(L) 9.2(L)  Hematocrit 36.0 - 46.0 % 29.4(L) 28.0(L) 29.6(L)  Platelets 150 - 400 K/uL 294 291 314   CMP Latest Ref Rng & Units 04/08/2021 04/07/2021 04/07/2021  Glucose 70 - 99 mg/dL 90 - 106(H)  BUN 8 -  23 mg/dL 14 - 7(L)  Creatinine 0.44 - 1.00 mg/dL 0.68 - 0.57  Sodium 135 - 145 mmol/L 138 - 140  Potassium 3.5 - 5.1 mmol/L 3.3(L) 2.8(L) 2.6(LL)  Chloride 98 - 111 mmol/L 103 - 102  CO2 22 - 32 mmol/L 27 - 28  Calcium 8.9 - 10.3 mg/dL 8.2(L) - 8.0(L)  Total Protein 6.5 - 8.1 g/dL - - -  Total Bilirubin 0.3 - 1.2 mg/dL - - -  Alkaline Phos 38 - 126 U/L - - -  AST 15 - 41 U/L - - -  ALT 0 - 44 U/L - - -     Imaging studies: No new pertinent imaging studies   Assessment/Plan: 87 y.o. 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   - I will get KUB today; ? Developing ileus given distension and tympany   - Monitor leukocytosis; worsening today; if continues to worsen tomorrow, may need to initiate Abx and consider further imaging (ex: CT)  - Continue CLD; hold on advancement just yet  - Discontinue foley catheter; okay to use pure wick - Replete K+; monitor; improving - Monitor abdominal examination; on-going ileostomy function - Appreciate WOC RN for ileostomy teaching    - Early mobilization encouraged as tolerated; engage PT today             - Should be okay to resume Heparin gtt this afternoon              -  Further management per primary service; we will follow      All of the above findings and recommendations were discussed with the patient, and the medical team, and all of patient's questions were answered to her expressed satisfaction.   -- Edison Simon, PA-C [facility] 04/08/2021, 7:18 AM [phone] M-F: 7am - 4pm

## 2021-04-08 NOTE — Progress Notes (Signed)
[place] for Electrolyte Monitoring and Replacement   Recent Labs: Potassium (mmol/L)  Date Value  04/08/2021 3.3 (L)  11/16/2012 3.3 (L)   Magnesium (mg/dL)  Date Value  04/08/2021 2.0   Calcium (mg/dL)  Date Value  04/08/2021 8.2 (L)   Calcium, Total (mg/dL)  Date Value  11/16/2012 8.8   Albumin (g/dL)  Date Value  04/01/2021 3.5  08/17/2011 3.7   Sodium (mmol/L)  Date Value  04/08/2021 138  11/16/2012 139   Corr Ca: 8.6 mg/dL  Assessment: 89YOF with PMH of HTN, stroke, TIA, GERD, hypothyroidism, depression with anxiety, hard of hearing, hiatal hernia, atrial fibrillation on warfarin, breast cancer, bladder cancer, anemia who presented with pain and constipation. 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 Pharmacy has been consulted for electrolyte replacement.   Goal of Therapy:  K 4 All other electrolytes WNL  Plan:  --K 3.3, will order 10 mEq Kcl IV x 4 runs and 40 mEq Kcl PO x 1 dose --Recheck electrolytes with AM labs   Sherilyn Banker, PharmD Clinical Pharmacist 04/08/2021 7:26 AM

## 2021-04-08 NOTE — TOC Initial Note (Signed)
Transition of Care [facility]) - Initial/Assessment Note    Patient Details  Name: Gabriela [name] MRN: [id number] Date of Birth: Jan 01, 1932  Transition of [facility]) CM/SW Contact:    Candie Chroman, LCSW Phone Number: 04/08/2021, 1:11 PM  Clinical Narrative:    CSW met with patient. Son at bedside. CSW introduced role and explained that PT recommendations would be discussed. Patient and son agreeable to SNF placement. Son lives in [place] and daughter lives in [place] so expanded search. They asked about [facility] and [facility] but explained [facility] only takes [employer] if the patient lives in their community and [facility] no longer accepts outside referrals. No further concerns. CSW encouraged patient and her husband to contact CSW as needed. CSW will continue to follow patient and her son for support and facilitate discharge to SNF once medically stable.              Expected Discharge Plan: Skilled Nursing Facility Barriers to Discharge: Continued Medical Work up   Patient Goals and CMS Choice   CMS Medicare.gov Compare Post Acute Care list provided to:: Patient (Son)    Expected Discharge Plan and Services Expected Discharge Plan: [facility] Acute Care Choice: [facility] Living arrangements for the past 2 months: Single Family Home                                      Prior Living Arrangements/Services Living arrangements for the past 2 months: Single Family Home Lives with:: Self Patient language and need for interpreter reviewed:: Yes Do you feel safe going back to the place where you live?: Yes      Need for Family Participation in Patient Care: Yes (Comment) Care giver support system in place?: Yes (comment)   Criminal Activity/Legal Involvement Pertinent to Current Situation/Hospitalization: No - Comment as needed  Activities of Daily Living Home Assistive Devices/Equipment: None ADL Screening (condition at time  of admission) Patient's cognitive ability adequate to safely complete daily activities?: Yes Is the patient deaf or have difficulty hearing?: Yes Does the patient have difficulty seeing, even when wearing glasses/contacts?: No Does the patient have difficulty concentrating, remembering, or making decisions?: No Patient able to express need for assistance with ADLs?: Yes Does the patient have difficulty dressing or bathing?: No Independently performs ADLs?: Yes (appropriate for developmental age) Does the patient have difficulty walking or climbing stairs?: Yes Weakness of Legs: Both Weakness of Arms/Hands: None  Permission Sought/Granted Permission sought to share information with : Facility Sport and exercise psychologist, Family Supports Permission granted to share information with : Yes, Verbal Permission Granted  Share Information with NAME: Trella Thurmond  Permission granted to share info w AGENCY: SNF's  Permission granted to share info w Relationship: Son  Permission granted to share info w Contact Information: [phone]  Emotional Assessment Appearance:: Appears stated age Attitude/Demeanor/Rapport: Engaged, Gracious Affect (typically observed): Accepting, Appropriate, Calm, Pleasant Orientation: : Oriented to Self, Oriented to Place, Oriented to  Time, Oriented to Situation Alcohol / Substance Use: Not Applicable Psych Involvement: No (comment)  Admission diagnosis:  Colon obstruction (HCC) [K56.609] Colonic obstruction (HCC) [K56.609] Generalized abdominal pain [R10.84] Abdominal pain [R10.9] Patient Active Problem List   Diagnosis Date Noted   Large bowel obstruction ([place]) 04/08/2021   Abdominal pain 04/01/2021   Hypertension    Hypothyroidism    Stroke (HCC)    Hypokalemia  Depression with anxiety    Atrial fibrillation, chronic (HCC)    Normocytic anemia    Sacroiliitis ([place]) 07/12/2016   Pain in right hip 07/12/2016   PCP:  [name], MD Pharmacy:    [place], [place] [address] Phone: [phone] Fax: [phone]     Social Determinants of Health (SDOH) Interventions    Readmission Risk Interventions No flowsheet data found.

## 2021-04-08 NOTE — Evaluation (Addendum)
Occupational Therapy Evaluation Patient Details Name: Gabriela [name] MRN: [name] DOB: 25-Jun-1932 Today's Date: 04/08/2021   History of Present Illness Pt. is a 87 y.o. female with medical history significant of hypertension, stroke, TIA, GERD, hypothyroidism, depression with anxiety, hard of hearing, hiatal hernia, atrial fibrillation on Coumadin, breast cancer, bladder cancer, anemia, who presents with abdominal pain and constipation. 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   Clinical Impression   Pt. presents with 9/10 abdominal pain, weakness, limited activity tolerance, and limited functional mobility which limits her ability to complete basic ADL and IADL functioning.  Pt. Resides at home alone. Pt. was independent with ADLs, and IADL functioning: including meal preparation, driving, homemaking, and medication management tasks. Pt. education was provided about A/E use for LE ADLs. Pt. Will benefit from OT services for ADL training, continued A/E training, and pt. Education about  work simplification strategies, home modification, and DME. Pt. would benefit from SNF level of care upon discharge, with follow-up OT services.      Recommendations for follow up therapy are one component of a multi-disciplinary discharge planning process, led by the attending physician.  Recommendations may be updated based on patient status, additional functional criteria and insurance authorization.   Follow Up Recommendations  SNF    Equipment Recommendations  3 in 1 bedside commode    Recommendations for Other Services       Precautions / Restrictions Precautions Precautions: Fall Restrictions Weight Bearing Restrictions: No      Mobility Bed Mobility     Transfers    General transfer comment: Deferred due to just returning to bed, and 9/10 pain.    Balance                                            ADL either performed or assessed with clinical judgement   ADL Overall ADL's : Needs assistance/impaired Eating/Feeding: Independent   Grooming: Independent;Bed level   Upper Body Bathing: Minimal assistance   Lower Body Bathing: Maximal assistance   Upper Body Dressing : Minimal assistance   Lower Body Dressing: Maximal assistance                       Vision Baseline Vision/History: 1 Wears glasses Patient Visual Report: No change from baseline       Perception     Praxis      Pertinent Vitals/Pain Pain Assessment: 0-10 Pain Score: 9  Pain Location: Abdominal Region Pain Descriptors / Indicators: Grimacing;Guarding Pain Intervention(s): Limited activity within patient's tolerance     Hand Dominance Right   Extremity/Trunk Assessment Upper Extremity Assessment Upper Extremity Assessment: Generalized weakness (Pt. with recent right shoulder FX s/p 3 months.)           Communication Communication Communication: HOH   Cognition Arousal/Alertness: Awake/alert Behavior During Therapy: WFL for tasks assessed/performed Overall Cognitive Status: Within Functional Limits for tasks assessed                                 General Comments: A &O x 4   General Comments       Exercises     Shoulder Instructions      Home Living Family/patient expects to be discharged to:: Private residence Living  Arrangements: Alone Available Help at Discharge: Family;Available PRN/intermittently Type of Home: House Home Access: Ramped entrance     Home Layout: One level         Bathroom Toilet: Standard     Home Equipment: Walker - 2 wheels;Cane - single point   Additional Comments: Pt reports possible plan to d/c with daughter's home (2-3 steps to enter with rails).      Prior Functioning/Environment Level of Independence: Independent        Comments: Pt reports independence with all ADLs and IADLs, driving, shopping,         OT Problem List: Decreased strength;Decreased activity tolerance;Impaired UE functional use;Obesity      OT Treatment/Interventions: Self-care/ADL training;Therapeutic exercise;DME and/or AE instruction;Patient/family education    OT Goals(Current goals can be found in the care plan section) Acute Rehab OT Goals Patient Stated Goal: to return home OT Goal Formulation: With patient Time For Goal Achievement: 04/08/21 Potential to Achieve Goals: Good  OT Frequency: Min 2X/week   Barriers to D/C:            Co-evaluation              AM-PAC OT "6 Clicks" Daily Activity     Outcome Measure Help from another person eating meals?: None Help from another person taking care of personal grooming?: None Help from another person toileting, which includes using toliet, bedpan, or urinal?: A Lot Help from another person bathing (including washing, rinsing, drying)?: A Lot Help from another person to put on and taking off regular upper body clothing?: A Little Help from another person to put on and taking off regular lower body clothing?: A Lot 6 Click Score: 17   End of Session Equipment Utilized During Treatment: Gait belt  Activity Tolerance: Patient tolerated treatment well Patient left: in bed  OT Visit Diagnosis: Unsteadiness on feet (R26.81);Muscle weakness (generalized) (M62.81)                Time: 1355-1420 OT Time Calculation (min): 25 min Charges:  OT General Charges $OT Visit: 1 Visit OT Evaluation $OT Eval Low Complexity: 1 Low  Harrel Carina, MS, OTR/L   Harrel Carina 04/08/2021, 5:18 PM

## 2021-04-08 NOTE — Plan of Care (Signed)
Pt has received 2 dose of dilaudid this shift. Potassium remained low at 2.8 pt received 2 runs of IV potassium and 14mq Potassium powder po. Awaiting labs results for this am.  Problem: Education: Goal: Knowledge of General Education information will improve Description: Including pain rating scale, medication(s)/side effects and non-pharmacologic comfort measures Outcome: Progressing   Problem: Health Behavior/Discharge Planning: Goal: Ability to manage health-related needs will improve Outcome: Progressing   Problem: Clinical Measurements: Goal: Ability to maintain clinical measurements within normal limits will improve Outcome: Progressing Goal: Will remain free from infection Outcome: Progressing Goal: Diagnostic test results will improve Outcome: Progressing Goal: Respiratory complications will improve Outcome: Progressing Goal: Cardiovascular complication will be avoided Outcome: Progressing   Problem: Activity: Goal: Risk for activity intolerance will decrease Outcome: Progressing   Problem: Nutrition: Goal: Adequate nutrition will be maintained Outcome: Progressing   Problem: Coping: Goal: Level of anxiety will decrease Outcome: Progressing  Problem: Elimination: Goal: Will not experience complications related to bowel motility Outcome: Progressing Goal: Will not experience complications related to urinary retention Outcome: Progressing   Problem: Pain Managment: Goal: General experience of comfort will improve Outcome: Progressing   Problem: Safety: Goal: Ability to remain free from injury will improve Outcome: Progressing   Problem: Skin Integrity: Goal: Risk for impaired skin integrity will decrease Outcome: Progressing

## 2021-04-08 NOTE — Progress Notes (Signed)
Nutrition Follow Up Note   DOCUMENTATION CODES:   Not applicable  INTERVENTION:   Recommend initiation of TPN if unable to advance diet in the next 24 hours  Pt at high refeed risk; recommend monitor potassium, magnesium and phosphorus labs daily until stable  NUTRITION DIAGNOSIS:   Inadequate oral intake related to acute illness as evidenced by other (comment) (pt on clear liquid diet).  GOAL:   Patient will meet greater than or equal to 90% of their needs -not met   MONITOR:   PO intake, Supplement acceptance, Diet advancement, Labs, Weight trends, Skin, I & O's  ASSESSMENT:   87 y.o. female with medical history significant of hypertension, stroke, TIA, GERD, hypothyroidism, depression with anxiety, hard of hearing, hiatal hernia, atrial fibrillation on Coumadin, breast cancer, bladder cancer, Afib and anemia who presents with abdominal pain and constipation and was found to have obstructing colon mass.  Pt s/p subtotal colectomy with end ileostomy 9/12  Pt remains on NPO/liquid diet since admission and is now without adequate nutrition for > 7 days. Pt currently on clear liquid diet and is documented to be eating only sips/bites. Pt's main complaint today is abdominal pain around her incision. KUB today reporting extensive soft tissue emphysema and pneumoperitoneum along with a few distended small bowel loops without generalized distention for ileus. RD will add supplements to help pt meet her estimated needs. Would recommend TPN if unable to advance pt's diet in the next 24 hours. Pt is at high refeed risk. No documented output from ostomy today. Per chart, pt is up ~10lbs from her admit weight; pt +10.3L on her I & O's.   Medications reviewed and include: dulcolax, synthroid, MVI, protonix, miralax, heparin   Labs reviewed: K 3.3(L), Mg 2.0 wnl Wbc- 23.0(H), Hgb 9.1(L), Hct 29.4(L)  Diet Order:   Diet Order             Diet clear liquid Room service appropriate? Yes;  Fluid consistency: Thin  Diet effective now                  EDUCATION NEEDS:   Education needs have been addressed  Skin:  Skin Assessment: Reviewed RN Assessment  Last BM:  9/13- two small pieces via ostomy  Height:   Ht Readings from Last 1 Encounters:  04/06/21 '5\' 3"'  (1.6 m)    Weight:   Wt Readings from Last 1 Encounters:  04/08/21 72.5 kg    Ideal Body Weight:  52.3 kg  BMI:  Body mass index is 28.31 kg/m.  Estimated Nutritional Needs:   Kcal:  1600-1800kcal/day  Protein:  80-90g/day  Fluid:  1.3-1.5L/day  Koleen Distance MS, RD, LDN Please refer to [facility] for RD and/or RD on-call/weekend/after hours pager

## 2021-04-09 ENCOUNTER — Inpatient Hospital Stay: Payer: Medicare PPO

## 2021-04-09 ENCOUNTER — Encounter: Payer: Self-pay | Admitting: Internal Medicine

## 2021-04-09 DIAGNOSIS — K56609 Unspecified intestinal obstruction, unspecified as to partial versus complete obstruction: Secondary | ICD-10-CM | POA: Diagnosis not present

## 2021-04-09 DIAGNOSIS — I482 Chronic atrial fibrillation, unspecified: Secondary | ICD-10-CM | POA: Diagnosis not present

## 2021-04-09 LAB — CBC
HCT: 28.6 % — ABNORMAL LOW (ref 36.0–46.0)
Hemoglobin: 8.9 g/dL — ABNORMAL LOW (ref 12.0–15.0)
MCH: 25.7 pg — ABNORMAL LOW (ref 26.0–34.0)
MCHC: 31.1 g/dL (ref 30.0–36.0)
MCV: 82.7 fL (ref 80.0–100.0)
Platelets: 310 10*3/uL (ref 150–400)
RBC: 3.46 MIL/uL — ABNORMAL LOW (ref 3.87–5.11)
RDW: 14.6 % (ref 11.5–15.5)
WBC: 27.6 10*3/uL — ABNORMAL HIGH (ref 4.0–10.5)
nRBC: 0 % (ref 0.0–0.2)

## 2021-04-09 LAB — BASIC METABOLIC PANEL
Anion gap: 8 (ref 5–15)
BUN: 27 mg/dL — ABNORMAL HIGH (ref 8–23)
CO2: 25 mmol/L (ref 22–32)
Calcium: 8.4 mg/dL — ABNORMAL LOW (ref 8.9–10.3)
Chloride: 102 mmol/L (ref 98–111)
Creatinine, Ser: 0.76 mg/dL (ref 0.44–1.00)
GFR, Estimated: >60 mL/min (ref >60)
Glucose, Bld: 135 mg/dL — ABNORMAL HIGH (ref 70–99)
Potassium: 4.3 mmol/L (ref 3.5–5.1)
Sodium: 135 mmol/L (ref 135–145)

## 2021-04-09 LAB — PROTIME-INR
INR: 1.6 — ABNORMAL HIGH (ref 0.8–1.2)
Prothrombin Time: 19.4 seconds — ABNORMAL HIGH (ref 11.4–15.2)

## 2021-04-09 LAB — GLUCOSE, CAPILLARY: Glucose-Capillary: 134 mg/dL — ABNORMAL HIGH (ref 70–99)

## 2021-04-09 LAB — HEPARIN LEVEL (UNFRACTIONATED)
Heparin Unfractionated: 0.14 IU/mL — ABNORMAL LOW (ref 0.30–0.70)
Heparin Unfractionated: 0.24 IU/mL — ABNORMAL LOW (ref 0.30–0.70)

## 2021-04-09 LAB — LACTIC ACID, PLASMA: Lactic Acid, Venous: 1.3 mmol/L (ref 0.5–1.9)

## 2021-04-09 LAB — PHOSPHORUS: Phosphorus: 2.3 mg/dL — ABNORMAL LOW (ref 2.5–4.6)

## 2021-04-09 LAB — MAGNESIUM: Magnesium: 2.2 mg/dL (ref 1.7–2.4)

## 2021-04-09 MED ORDER — HEPARIN BOLUS VIA INFUSION
2000.0000 [IU] | Freq: Once | INTRAVENOUS | Status: AC
  Administered 2021-04-09: 2000 [IU] via INTRAVENOUS
  Filled 2021-04-09: qty 2000

## 2021-04-09 MED ORDER — IOHEXOL 9 MG/ML PO SOLN
500.0000 mL | ORAL | Status: AC
  Administered 2021-04-09 (×2): 500 mL via ORAL

## 2021-04-09 MED ORDER — BISACODYL 10 MG RE SUPP
10.0000 mg | Freq: Once | RECTAL | Status: AC
  Administered 2021-04-09: 10 mg via RECTAL
  Filled 2021-04-09: qty 1

## 2021-04-09 MED ORDER — LACTATED RINGERS IV SOLN: INTRAVENOUS | Status: DC

## 2021-04-09 MED ORDER — ENSURE ENLIVE PO LIQD
237.0000 mL | Freq: Three times a day (TID) | ORAL | Status: DC
  Administered 2021-04-10 – 2021-04-22 (×24): 237 mL via ORAL

## 2021-04-09 MED ORDER — HEPARIN BOLUS VIA INFUSION
1000.0000 [IU] | Freq: Once | INTRAVENOUS | Status: AC
  Administered 2021-04-09: 1000 [IU] via INTRAVENOUS
  Filled 2021-04-09: qty 1000

## 2021-04-09 MED ORDER — IOHEXOL 350 MG/ML SOLN
75.0000 mL | Freq: Once | INTRAVENOUS | Status: AC | PRN
  Administered 2021-04-09: 75 mL via INTRAVENOUS

## 2021-04-09 MED ORDER — K PHOS MONO-SOD PHOS DI & MONO 155-852-130 MG PO TABS
500.0000 mg | ORAL_TABLET | ORAL | Status: AC
  Administered 2021-04-09 (×2): 500 mg via ORAL
  Filled 2021-04-09 (×2): qty 2

## 2021-04-09 MED ORDER — WARFARIN SODIUM 5 MG PO TABS
5.0000 mg | ORAL_TABLET | Freq: Once | ORAL | Status: AC
  Administered 2021-04-09: 5 mg via ORAL
  Filled 2021-04-09: qty 1

## 2021-04-09 NOTE — Consult Note (Signed)
[place] for heparin infusion bridge with warfarin Indication: atrial fibrillation  Allergies  Allergen Reactions   Celebrex [Celecoxib] Anaphylaxis   Morphine And Related Swelling    Lips and mouth   Povidone-Iodine Other (See Comments)    Other reaction(s): ITCHING Other reaction(s): ITCHING    Sulfa Antibiotics     Patient Measurements: Height: '5\' 3"'$  (160 cm) Weight: 72.1 kg (158 lb 15.2 oz) IBW/kg (Calculated) : 52.4 Heparin Dosing Weight: 65.9 kg  Vital Signs: Temp: 99 F (37.2 C) (09/15 0509) Temp Source: Oral (09/15 0509) BP: 121/59 (09/15 0509) Pulse Rate: 98 (09/15 0509)  Labs: Recent Labs    04/07/21 0534 04/08/21 0325 04/08/21 1155 04/08/21 2250 04/09/21 0458  HGB 8.7* 9.1*  --   --  8.9*  HCT 28.0* 29.4*  --   --  28.6*  PLT 291 294  --   --  310  LABPROT  --   --  17.5*  --  19.4*  INR  --   --  1.4*  --  1.6*  HEPARINUNFRC  --   --   --  <0.10*  --   CREATININE 0.57 0.68  --   --  0.76     Estimated Creatinine Clearance: 47.2 mL/min (by C-G formula based on SCr of 0.76 mg/dL).   Medical History: Past Medical History:  Diagnosis Date   Acid reflux    Anxiety    Arrhythmia    Bladder cancer (HCC)    Breast cancer ([place])    Cancer (HCC)    breast,bladder   Depression    Dysrhythmia    afib   GERD (gastroesophageal reflux disease)    History of hiatal hernia    HOH (hard of hearing)    aids   Hypertension    Hyperthyroidism    Hypothyroidism    Stroke ([place])    2010   TIA (transient ischemic attack)     Medications:  Scheduled:   amLODipine  5 mg Oral Daily   bisacodyl  10 mg Rectal Once   Chlorhexidine Gluconate Cloth  6 each Topical Daily   feeding supplement  1 Container Oral TID BM   irbesartan  300 mg Oral Daily   levothyroxine  112 mcg Oral QAC breakfast   multivitamin with minerals  1 tablet Oral Daily   pantoprazole  40 mg Oral Daily   phosphorus  500 mg Oral Q4H    polyethylene glycol  17 g Oral Daily   sertraline  50 mg Oral QHS   Warfarin - Pharmacist Dosing Inpatient   Does not apply q1600    Assessment: 87 y.o. female with medical history significant of hypertension, stroke, TIA, GERD, hypothyroidism, depression with anxiety, hard of hearing, hiatal hernia, atrial fibrillation on Coumadin, breast cancer, bladder cancer, anemia, who presents with abdominal pain and constipation. Pharmacy has been consulted for heparin bridge and warfarin. Baseline CBC was within normal limits.   PTA warfarin regimen: 3 mg on Sat/Sun/Mon/Tue; 4 mg on Wed/Thur/Fri - last dose was 3 mg on 9/6  Goal of Therapy:  INR 2-3 Heparin level 0.3-0.7 units/ml Monitor platelets by anticoagulation protocol: Yes  Warfarin: Date INR Comment  9/14 1.4 Subtherapeutic  9/15 1.6 Subtherapeutic   Heparin: Date Time HL Rate/Comment  9/14 2250 <0.10 Subtherapeutic 1000 > 1200 units/hr 9/15 0857 0.14 Subtherapeutic 1200 > 1400 units/hr   Plan:  Heparin Heparin subtherapeutic  Heparin bolus 2000 units x 1 Increase heparin infusion to 1400  units/hr Check HL in 8 hours following rate change  Monitor CBC per protocol Stop heparin once INR at goal  Warfarin INR subtherapeutic as pt has not had a dose since 9/6. Will order 5 mg warfarin tonight Daily INR checks Monitor CBC per protocol   Sherilyn Banker, PharmD Clinical Pharmacist   04/09/2021 7:17 AM

## 2021-04-09 NOTE — Progress Notes (Signed)
ANTICOAGULATION CONSULT NOTE - Follow Up Consult  Pharmacy Consult for Heparin bridge with warfarin Indication: atrial fibrillation  Allergies  Allergen Reactions   Celebrex [Celecoxib] Anaphylaxis   Morphine And Related Swelling    Lips and mouth   Povidone-Iodine Other (See Comments)    Other reaction(s): ITCHING Other reaction(s): ITCHING    Sulfa Antibiotics     Patient Measurements: Height: '5\' 3"'$  (160 cm) Weight: 72.1 kg (158 lb 15.2 oz) IBW/kg (Calculated) : 52.4 Heparin Dosing Weight: 65.9kg  Vital Signs: Temp: 98 F (36.7 C) (09/15 0738) Temp Source: Oral (09/15 0738) BP: 128/68 (09/15 0738) Pulse Rate: 100 (09/15 0738)  Labs: Recent Labs    04/07/21 0534 04/08/21 0325 04/08/21 1155 04/08/21 2250 04/09/21 0458 04/09/21 0857 04/09/21 1757  HGB 8.7* 9.1*  --   --  8.9*  --   --   HCT 28.0* 29.4*  --   --  28.6*  --   --   PLT 291 294  --   --  310  --   --   LABPROT  --   --  17.5*  --  19.4*  --   --   INR  --   --  1.4*  --  1.6*  --   --   HEPARINUNFRC  --   --   --  <0.10*  --  0.14* 0.24*  CREATININE 0.57 0.68  --   --  0.76  --   --     Estimated Creatinine Clearance: 47.2 mL/min (by C-G formula based on SCr of 0.76 mg/dL).   Medications:  Scheduled:   amLODipine  5 mg Oral Daily   bisacodyl  10 mg Rectal Once   Chlorhexidine Gluconate Cloth  6 each Topical Daily   feeding supplement  237 mL Oral TID BM   irbesartan  300 mg Oral Daily   levothyroxine  112 mcg Oral QAC breakfast   multivitamin with minerals  1 tablet Oral Daily   pantoprazole  40 mg Oral Daily   sertraline  50 mg Oral QHS   Warfarin - Pharmacist Dosing Inpatient   Does not apply q61    Assessment: 87 y.o. female with medical history significant of hypertension, stroke, TIA, GERD, hypothyroidism, depression with anxiety, hard of hearing, hiatal hernia, atrial fibrillation on Coumadin, breast cancer, bladder cancer, anemia, who presents with abdominal pain and  constipation. Pharmacy has been consulted for heparin bridge and warfarin. Baseline CBC was within normal limits.    PTA warfarin regimen: 3 mg on Sat/Sun/Mon/Tue; 4 mg on Wed/Thur/Fri - last dose was 3 mg on 9/6  Goal of Therapy:  INR 2-3 Heparin level 0.3-0.7 units/ml Monitor platelets by anticoagulation protocol: Yes  Warfarin: Date    INR      Comment  9/14     1.4       Subtherapeutic  9/15     1.6       Subtherapeutic    Heparin: Date    Time    HL       Rate/Comment  9/14     2250    <0.10   Subtherapeutic 1000 > 1200 units/hr 9/15     0857    0.14     Subtherapeutic 1200 > 1400 units/hr 9/15     1816    0.24     Subtherapeutic 1400> 1500 units/hr   Plan:  Heparin bolus 1000 units x 1 Increase heparin infusion to 1500 units/hr Check HL in 8  hours following rate change  Monitor CBC per protocol Stop heparin once INR at goal  Jomar Denz Rodriguez-Guzman PharmD, BCPS 04/09/2021 6:28 PM

## 2021-04-09 NOTE — Progress Notes (Signed)
[facility] Day(s): 8.   Post op day(s): 3 Days Post-Op.   Interval History:  Patient seen and examined No acute events or new complaints overnight.  Patient reports she continues to have diffuse abdominal pain, but this is somewhat improved this morning No fever, chills, cough, CP, SOB, nausea, emesis, or lower extremity swelling or pain She unfortunately continues to have worsening leukocytosis to 27.6K Hgb remains stable at 8.9 Renal function remains normal; sCr - 0.76: UO - 50 + unmeasured  Previous hypokalemia is resolved Lactic acid level is normal at 1.3 She has tolerated advancement to full liquids She is having a small amount of ileostomy function  Vital signs in last 24 hours: [min-max] current  Temp:  [98 F (36.7 C)-99 F (37.2 C)] 98 F (36.7 C) (09/15 0738) Pulse Rate:  [98-104] 100 (09/15 0738) Resp:  [17-19] 19 (09/15 0738) BP: (121-145)/(59-68) 128/68 (09/15 0738) SpO2:  [92 %-94 %] 94 % (09/15 0738) Weight:  [72.1 kg] 72.1 kg (09/15 0500)     Height: '5\' 3"'$  (160 cm) Weight: 72.1 kg BMI (Calculated): 28.16   Intake/Output last 2 shifts:  09/14 0701 - 09/15 0700 In: 2149.5 [P.O.:1737; I.V.:12.5; IV Piggyback:400] Out: 50 [Urine:50]   Physical Exam:  Constitutional: alert, cooperative and no distress  Respiratory: breathing non-labored at rest  Cardiovascular: regular rate and sinus rhythm  Gastrointestinal: Soft, she is somewhat diffusely tender, no rebound/guarding, she does not appear peritonitic. Ileostomy in the right abdomen, dusky/congested, no gas, small amount of liquid stool and bile in bag Integumentary: Laparoscopic incisions are CDI with dermabond, no erythema or drainage   Labs:  CBC Latest Ref Rng & Units 04/09/2021 04/08/2021 04/07/2021  WBC 4.0 - 10.5 K/uL 27.6(H) 23.0(H) 11.6(H)  Hemoglobin 12.0 - 15.0 g/dL 8.9(L) 9.1(L) 8.7(L)  Hematocrit 36.0 - 46.0 % 28.6(L) 29.4(L) 28.0(L)  Platelets 150  - 400 K/uL 310 294 291   CMP Latest Ref Rng & Units 04/09/2021 04/08/2021 04/07/2021  Glucose 70 - 99 mg/dL 135(H) 90 -  BUN 8 - 23 mg/dL 27(H) 14 -  Creatinine 0.44 - 1.00 mg/dL 0.76 0.68 -  Sodium 135 - 145 mmol/L 135 138 -  Potassium 3.5 - 5.1 mmol/L 4.3 3.3(L) 2.8(L)  Chloride 98 - 111 mmol/L 102 103 -  CO2 22 - 32 mmol/L 25 27 -  Calcium 8.9 - 10.3 mg/dL 8.4(L) 8.2(L) -  Total Protein 6.5 - 8.1 g/dL - - -  Total Bilirubin 0.3 - 1.2 mg/dL - - -  Alkaline Phos 38 - 126 U/L - - -  AST 15 - 41 U/L - - -  ALT 0 - 44 U/L - - -     Imaging studies: No new pertinent imaging studies   Assessment/Plan: 87 y.o. female with worsening leukocytosis 3 Days Post-Op s/p robotic assisted laparoscopic subtotal colectomy with end ileostomy for obstructing transverse colon mass.   - Given her continued worsening of leukocytosis, we will obtain CT Abdomen/Pelvis today to re-evaluate her abdomen and ensure no missed intra-abdominal process. Thankfully, she has otherwise been hemodynamically stable and without any other/signs symptoms of sepsis/infection.    - Continue full liquids   - Monitor leukocytosis; worsening today - Monitor abdominal examination; on-going ileostomy function - Appreciate WOC RN for ileostomy teaching               - Early mobilization encouraged as tolerated; PT following              -  Okay to continue Heparin gtt             - Further management per primary service; we will follow     All of the above findings and recommendations were discussed with the patient, patient's family (son and daughter at bedside), and the medical team, and all of patient's and family's questions were answered to their expressed satisfaction.  -- Edison Simon, PA-C [facility] 04/09/2021, 11:40 AM [phone] M-F: 7am - 4pm

## 2021-04-09 NOTE — Progress Notes (Signed)
PHARMACY CONSULT NOTE   Pharmacy Consult for Electrolyte Monitoring and Replacement   Recent Labs: Potassium (mmol/L)  Date Value  04/09/2021 4.3  11/16/2012 3.3 (L)   Magnesium (mg/dL)  Date Value  04/09/2021 2.2   Calcium (mg/dL)  Date Value  04/09/2021 8.4 (L)   Calcium, Total (mg/dL)  Date Value  11/16/2012 8.8   Albumin (g/dL)  Date Value  04/01/2021 3.5  08/17/2011 3.7   Phosphorus (mg/dL)  Date Value  04/09/2021 2.3 (L)   Sodium (mmol/L)  Date Value  04/09/2021 135  11/16/2012 139   Corr Ca: 8.8 mg/dL  Assessment: 89YOF with PMH of HTN, stroke, TIA, GERD, hypothyroidism, depression with anxiety, hard of hearing, hiatal hernia, atrial fibrillation on warfarin, breast cancer, bladder cancer, anemia who presented with pain and constipation. 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 Pharmacy has been consulted for electrolyte replacement.   Goal of Therapy:  K 4 All other electrolytes WNL  Plan:  --Phos 2.3 - will order Kphos 2 tabs x 2 doses --Recheck electrolytes with AM labs   Sherilyn Banker, PharmD Clinical Pharmacist 04/09/2021 7:14 AM

## 2021-04-09 NOTE — Progress Notes (Signed)
Progress Note    Gabriela [name]  [id number] DOB: 06-May-1932  DOA: 04/01/2021 PCP: [name], MD      Brief Narrative:    Medical records reviewed and are as summarized below:  Gabriela [name] is a 87 y.o. female with medical history significant for hypertension, TIA, stroke, CAD, hypothyroidism, depression, anxiety, hearing impairment, hiatal hernia, atrial fibrillation on Coumadin, breast cancer, bladder cancer, chronic anemia.  She presented to the hospital with abdominal pain and constipation.  She she was found to have large bowel obstruction secondary to colonic mass concerning for malignancy.  She was treated with IV fluids and analgesics.  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       Assessment/Plan:   Principal Problem:   Large bowel obstruction (HCC) Active Problems:   Abdominal pain   Hypertension   Hypothyroidism   Stroke ([place])   Hypokalemia   Depression with anxiety   Atrial fibrillation, chronic (HCC)   Normocytic anemia   Nutrition Problem: Inadequate oral intake Etiology: acute illness  Signs/Symptoms: other (comment) (pt on clear liquid diet)   Body mass index is 28.16 kg/m.   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  CT abdomen and pelvis has been ordered for further evaluation because of persistent abdominal pain and significant leukocytosis.  Lactic acid level was normal.  Extensive soft tissue emphysema and pneumoperitoneum in the setting of recent laparoscopic partial colectomy: Chest x-ray showed bilateral subcutaneous emphysema.  Follow-up with general surgeon.  Worsening leukocytosis: Repeat CBC tomorrow. CT abd/pelvis is pending.  Atrial fibrillation and history of stroke: Continue IV heparin infusion and Coumadin, and monitor heparin level and INR per protocol.  CHA2DS2-VASc score  is 6.  Hypokalemia: Improved  Other comorbidities include depression, anxiety, chronic anemia, hypothyroidism, hypertension     Diet Order             Diet full liquid Room service appropriate? Yes; Fluid consistency: Thin  Diet effective now                      Consultants: General surgeon  Procedures: Robotic assisted subtotal colectomy with end ileostomy on 04/06/2021    Medications:    amLODipine  5 mg Oral Daily   bisacodyl  10 mg Rectal Once   Chlorhexidine Gluconate Cloth  6 each Topical Daily   feeding supplement  237 mL Oral TID BM   irbesartan  300 mg Oral Daily   levothyroxine  112 mcg Oral QAC breakfast   multivitamin with minerals  1 tablet Oral Daily   pantoprazole  40 mg Oral Daily   sertraline  50 mg Oral QHS   warfarin  5 mg Oral ONCE-1600   Warfarin - Pharmacist Dosing Inpatient   Does not apply q1600   Continuous Infusions:  heparin 1,400 Units/hr (04/09/21 1015)   lactated ringers 75 mL/hr at 04/09/21 0905     Anti-infectives (From admission, onward)    Start     Dose/Rate Route Frequency Ordered Stop   04/06/21 1320  sodium chloride 0.9 % with cefoTEtan (CEFOTAN) ADS Med       Note to Pharmacy: Maryagnes Amos   : cabinet override      04/06/21 1320 04/06/21 1457   04/06/21 0830  cefoTEtan (CEFOTAN) 2 g in sodium chloride 0.9 % 100 mL IVPB  2 g 200 mL/hr over 30 Minutes Intravenous On call to O.R. 04/06/21 0740 04/06/21 1452              Family Communication/Anticipated D/C date and plan/Code Status   DVT prophylaxis: SCDs Start: 04/01/21 1207 warfarin (COUMADIN) tablet 5 mg     Code Status: DNR  Family Communication: None Disposition Plan:    Status is: Inpatient  Remains inpatient appropriate because:IV treatments appropriate due to intensity of illness or inability to take PO and Inpatient level of care appropriate due to severity of illness  Dispo: The patient is from: Home               Anticipated d/c is to: Home              Patient currently is not medically stable to d/c.   Difficult to place patient No           Subjective:   Interval events noted.  She complains of abdominal pain.  Her nurse, lab technician and Dr. Christian Mate (surgeon) were at the bedside.  Objective:    Vitals:   04/08/21 1943 04/09/21 0500 04/09/21 0509 04/09/21 0738  BP: (!) 145/59  (!) 121/59 128/68  Pulse: (!) 104  98 100  Resp: '17  17 19  '$ Temp: 98.9 F (37.2 C)  99 F (37.2 C) 98 F (36.7 C)  TempSrc: Oral  Oral Oral  SpO2: 93%  92% 94%  Weight:  72.1 kg    Height:       No data found.   Intake/Output Summary (Last 24 hours) at 04/09/2021 1524 Last data filed at 04/09/2021 1500 Gross per 24 hour  Intake 2326.56 ml  Output 200 ml  Net 2126.56 ml   Filed Weights   04/07/21 0407 04/08/21 0500 04/09/21 0500  Weight: 71.4 kg 72.5 kg 72.1 kg    Exam:  GEN: NAD SKIN: Warm and dry EYES: No pallor or icterus ENT: MMM CV: RRR PULM: CTA B ABD: soft, ND, diffuse tenderness, no rebound tenderness or guarding, + ileostomy with dark green fluid in the ostomy bag, +BS CNS: AAO x 3, non focal EXT: No edema or tenderness        Data Reviewed:   I have personally reviewed following labs and imaging studies:  Labs: Labs show the following:   Basic Metabolic Panel: Recent Labs  Lab 04/07/21 0534 04/07/21 1806 04/08/21 0325 04/09/21 0458  NA 140  --  138 135  K 2.6*   < > 3.3* 4.3  CL 102  --  103 102  CO2 28  --  27 25  GLUCOSE 106*  --  90 135*  BUN 7*  --  14 27*  CREATININE 0.57  --  0.68 0.76  CALCIUM 8.0*  --  8.2* 8.4*  MG  --   --  2.0 2.2  PHOS  --   --   --  2.3*   < > = values in this interval not displayed.   GFR Estimated Creatinine Clearance: 47.2 mL/min (by C-G formula based on SCr of 0.76 mg/dL). Liver Function Tests: No results for input(s): AST, ALT, ALKPHOS, BILITOT, PROT, ALBUMIN in the last 168 hours. No results for input(s):  LIPASE, AMYLASE in the last 168 hours. No results for input(s): AMMONIA in the last 168 hours. Coagulation profile Recent Labs  Lab 04/04/21 0406 04/05/21 1128 04/06/21 0426 04/08/21 1155 04/09/21 0458  INR 2.2* 1.6* 1.6* 1.4* 1.6*    CBC: Recent  Labs  Lab 04/05/21 1255 04/06/21 0426 04/07/21 0534 04/08/21 0325 04/09/21 0458  WBC 6.2 5.5 11.6* 23.0* 27.6*  HGB 9.8* 9.2* 8.7* 9.1* 8.9*  HCT 30.3* 29.6* 28.0* 29.4* 28.6*  MCV 82.6 82.0 82.4 82.8 82.7  PLT 329 314 291 294 310   Cardiac Enzymes: No results for input(s): CKTOTAL, CKMB, CKMBINDEX, TROPONINI in the last 168 hours. BNP (last 3 results) No results for input(s): PROBNP in the last 8760 hours. CBG: Recent Labs  Lab 04/03/21 0752 04/04/21 0809 04/07/21 0737 04/08/21 0808 04/09/21 0735  GLUCAP 96 87 102* 88 134*   D-Dimer: No results for input(s): DDIMER in the last 72 hours. Hgb A1c: No results for input(s): HGBA1C in the last 72 hours. Lipid Profile: No results for input(s): CHOL, HDL, LDLCALC, TRIG, CHOLHDL, LDLDIRECT in the last 72 hours. Thyroid function studies: No results for input(s): TSH, T4TOTAL, T3FREE, THYROIDAB in the last 72 hours.  Invalid input(s): FREET3 Anemia work up: No results for input(s): VITAMINB12, FOLATE, FERRITIN, TIBC, IRON, RETICCTPCT in the last 72 hours. Sepsis Labs: Recent Labs  Lab 04/06/21 0426 04/07/21 0534 04/08/21 0325 04/09/21 0458 04/09/21 0902  WBC 5.5 11.6* 23.0* 27.6*  --   LATICACIDVEN  --   --   --   --  1.3    Microbiology Recent Results (from the past 240 hour(s))  Resp Panel by RT-PCR (Flu A&B, Covid) Nasopharyngeal Swab     Status: None   Collection Time: 04/01/21  2:04 PM   Specimen: Nasopharyngeal Swab; Nasopharyngeal(NP) swabs in vial transport medium  Result Value Ref Range Status   SARS Coronavirus 2 by RT PCR NEGATIVE NEGATIVE Final    Comment: (NOTE) SARS-CoV-2 target nucleic acids are NOT DETECTED.  The SARS-CoV-2 RNA is generally  detectable in upper respiratory specimens during the acute phase of infection. The lowest concentration of SARS-CoV-2 viral copies this assay can detect is 138 copies/mL. A negative result does not preclude SARS-Cov-2 infection and should not be used as the sole basis for treatment or other patient management decisions. A negative result may occur with  improper specimen collection/handling, submission of specimen other than nasopharyngeal swab, presence of viral mutation(s) within the areas targeted by this assay, and inadequate number of viral copies(<138 copies/mL). A negative result must be combined with clinical observations, patient history, and epidemiological information. The expected result is Negative.  Fact Sheet for Patients:  EntrepreneurPulse.com.au  Fact Sheet for Healthcare Providers:  IncredibleEmployment.be  This test is no t yet approved or cleared by the [place] FDA and  has been authorized for detection and/or diagnosis of SARS-CoV-2 by FDA under an Emergency Use Authorization (EUA). This EUA will remain  in effect (meaning this test can be used) for the duration of the COVID-19 declaration under Section 564(b)(1) of the Act, 21 U.S.C.section 360bbb-3(b)(1), unless the authorization is terminated  or revoked sooner.       Influenza A by PCR NEGATIVE NEGATIVE Final   Influenza B by PCR NEGATIVE NEGATIVE Final    Comment: (NOTE) The Xpert Xpress SARS-CoV-2/FLU/RSV plus assay is intended as an aid in the diagnosis of influenza from Nasopharyngeal swab specimens and should not be used as a sole basis for treatment. Nasal washings and aspirates are unacceptable for Xpert Xpress SARS-CoV-2/FLU/RSV testing.  Fact Sheet for Patients: EntrepreneurPulse.com.au  Fact Sheet for Healthcare Providers: IncredibleEmployment.be  This test is not yet approved or cleared by the [place] FDA  and has been authorized for detection and/or diagnosis of SARS-CoV-2  by FDA under an Emergency Use Authorization (EUA). This EUA will remain in effect (meaning this test can be used) for the duration of the COVID-19 declaration under Section 564(b)(1) of the Act, 21 U.S.C. section 360bbb-3(b)(1), unless the authorization is terminated or revoked.  Performed at [facility], [address] [place], [place] [address]   MRSA Next Gen by PCR, Nasal     Status: None   Collection Time: 04/04/21  5:56 AM   Specimen: Nasal Mucosa; Nasal Swab  Result Value Ref Range Status   MRSA by PCR Next Gen NOT DETECTED NOT DETECTED Final    Comment: (NOTE) The GeneXpert MRSA Assay (FDA approved for NASAL specimens only), is one component of a comprehensive MRSA colonization surveillance program. It is not intended to diagnose MRSA infection nor to guide or monitor treatment for MRSA infections. Test performance is not FDA approved in patients less than 51 years old. Performed at [facility], [address] [place], [place] [address]     Procedures and diagnostic studies:  DG Chest [facility] 1 View  Result Date: 04/08/2021 CLINICAL DATA:  Pleural effusion. EXAM: PORTABLE CHEST 1 VIEW COMPARISON:  November 13, 2012. FINDINGS: Stable cardiomegaly. No pneumothorax is noted. Bilateral subcutaneous emphysema is noted. Elevated right hemidiaphragm is noted. Mild bibasilar subsegmental atelectasis is noted. Bony thorax is unremarkable. IMPRESSION: Bilateral subcutaneous emphysema is noted. Elevated right hemidiaphragm is noted. Mild bibasilar subsegmental atelectasis. Electronically Signed   By: Marijo Conception M.D.   On: 04/08/2021 15:33   DG Abd Portable 1V  Result Date: 04/08/2021 CLINICAL DATA:  Abdominal pain and ileus EXAM: PORTABLE ABDOMEN - 1 VIEW COMPARISON:  Barium enema from 5 days ago FINDINGS: Extensive soft tissue emphysema throughout the abdominal wall. Rigler sign seen in the  ventral abdomen. Suspect pleural fluid at the bases with lucent central lower chest, presumed extension of gas. Mild retained barium within diverticula of the distal sigmoid. A few distended small bowel loops without generalized distention for ileus. These results were called by telephone at the time of interpretation on 04/08/2021 at 10:17 am to provider [facility] , who verbally acknowledged these results. IMPRESSION: 1. Extensive soft tissue emphysema and pneumoperitoneum in the setting of recent laparoscopic partial colectomy. 2. Air leak continues over the lower chest with possible pleural fluid, chest radiography is suggested. Electronically Signed   By: Jorje Guild M.D.   On: 04/08/2021 10:17               LOS: 8 days   Derrian Poli  [facility]   Pager on [email or web address]. If 7PM-7AM, please contact night-coverage at [email or web address]     04/09/2021, 3:24 PM

## 2021-04-09 NOTE — Consult Note (Signed)
[place] Nurse ostomy follow up Patient receiving care in [facility].  Review of CM/SW note from 9/14 reveals the patient will be discharged to a SNF, not home with daughter as originally explained to me. Stoma type/location: RUQ ileostomy Stomal assessment/size: stoma red, moist, producing Peristomal assessment: deferred, pouch without s/s of washout or leaking Treatment options for stomal/peristomal skin: barrier ring Output: think dark green effluent Ostomy pouching: 2pc. 2 and 1/4 inch flat with barrier ring Education provided: none. No family present. Patient tells me her daughter was diagnosed with food poisoning shortly after the teaching session I had with her and her son on 9/13 and had to be admitted to a hospital. Because she is going to a SNF at discharge, no further teaching is indicated.  SNF staff will provide ostomy care for patient while she is in their care. Enrolled patient in [place] Start Discharge program: Yes on 9/13.  [place] nurse will not follow at this time.  Please re-consult the [place] team if needed.  Val Riles, RN, MSN, CWOCN, CNS-BC, pager [phone]

## 2021-04-09 NOTE — Care Management Important Message (Signed)
Important Message  Patient Details  Name: Gabriela [name] MRN: [id number] Date of Birth: 1931/12/13   Medicare Important Message Given:  Yes     [name] 04/09/2021, 1:21 PM

## 2021-04-09 NOTE — TOC Progression Note (Addendum)
Transition of Care [facility]) - Progression Note    Patient Details  Name: Gabriela [name] MRN: [id number] Date of Birth: [birth year]  Transition of [facility]) CM/SW [place], LCSW Phone Number: 04/09/2021, 12:02 PM  Clinical Narrative:   Provided bed offers to patient and her children. They are interested in [place]. Decision still pending. Left voicemail for admissions coordinator asking her to review referral.  3:12 pm: Spoke to [facility] admissions coordinator. She has asked the nursing staff to review the referral and someone will follow up with decision.  Expected Discharge Plan: [place] Barriers to Discharge: Continued Medical Work up  Expected Discharge Plan and Services Expected Discharge Plan: [place] Choice: [place] arrangements for the past 2 months: Single Family Home                                       Social Determinants of Health (SDOH) Interventions    Readmission Risk Interventions No flowsheet data found.

## 2021-04-10 DIAGNOSIS — K56609 Unspecified intestinal obstruction, unspecified as to partial versus complete obstruction: Secondary | ICD-10-CM | POA: Diagnosis not present

## 2021-04-10 LAB — BASIC METABOLIC PANEL
Anion gap: 8 (ref 5–15)
BUN: 25 mg/dL — ABNORMAL HIGH (ref 8–23)
CO2: 26 mmol/L (ref 22–32)
Calcium: 8 mg/dL — ABNORMAL LOW (ref 8.9–10.3)
Chloride: 99 mmol/L (ref 98–111)
Creatinine, Ser: 0.66 mg/dL (ref 0.44–1.00)
GFR, Estimated: >60 mL/min (ref >60)
Glucose, Bld: 111 mg/dL — ABNORMAL HIGH (ref 70–99)
Potassium: 3.8 mmol/L (ref 3.5–5.1)
Sodium: 133 mmol/L — ABNORMAL LOW (ref 135–145)

## 2021-04-10 LAB — CBC
HCT: 26.8 % — ABNORMAL LOW (ref 36.0–46.0)
Hemoglobin: 8.3 g/dL — ABNORMAL LOW (ref 12.0–15.0)
MCH: 25.6 pg — ABNORMAL LOW (ref 26.0–34.0)
MCHC: 31 g/dL (ref 30.0–36.0)
MCV: 82.7 fL (ref 80.0–100.0)
Platelets: 303 10*3/uL (ref 150–400)
RBC: 3.24 MIL/uL — ABNORMAL LOW (ref 3.87–5.11)
RDW: 14.8 % (ref 11.5–15.5)
WBC: 23.9 10*3/uL — ABNORMAL HIGH (ref 4.0–10.5)
nRBC: 0 % (ref 0.0–0.2)

## 2021-04-10 LAB — MAGNESIUM: Magnesium: 2.3 mg/dL (ref 1.7–2.4)

## 2021-04-10 LAB — PHOSPHORUS: Phosphorus: 4.1 mg/dL (ref 2.5–4.6)

## 2021-04-10 LAB — PROTIME-INR
INR: 2.4 — ABNORMAL HIGH (ref 0.8–1.2)
Prothrombin Time: 25.8 seconds — ABNORMAL HIGH (ref 11.4–15.2)

## 2021-04-10 LAB — HEPARIN LEVEL (UNFRACTIONATED): Heparin Unfractionated: 0.24 IU/mL — ABNORMAL LOW (ref 0.30–0.70)

## 2021-04-10 MED ORDER — WARFARIN SODIUM 2 MG PO TABS
2.0000 mg | ORAL_TABLET | Freq: Once | ORAL | Status: AC
  Administered 2021-04-10: 2 mg via ORAL
  Filled 2021-04-10: qty 1

## 2021-04-10 MED ORDER — POTASSIUM CHLORIDE 20 MEQ PO PACK
40.0000 meq | PACK | Freq: Once | ORAL | Status: AC
  Administered 2021-04-10: 40 meq via ORAL
  Filled 2021-04-10: qty 2

## 2021-04-10 NOTE — Progress Notes (Signed)
PHARMACY CONSULT NOTE   Pharmacy Consult for Electrolyte Monitoring and Replacement   Recent Labs: Potassium (mmol/L)  Date Value  04/10/2021 3.8  11/16/2012 3.3 (L)   Magnesium (mg/dL)  Date Value  04/10/2021 2.3   Calcium (mg/dL)  Date Value  04/10/2021 8.0 (L)   Calcium, Total (mg/dL)  Date Value  11/16/2012 8.8   Albumin (g/dL)  Date Value  04/01/2021 3.5  08/17/2011 3.7   Phosphorus (mg/dL)  Date Value  04/10/2021 4.1   Sodium (mmol/L)  Date Value  04/10/2021 133 (L)  11/16/2012 139   Corr Ca: 8.8 mg/dL  Assessment: 89YOF with PMH of HTN, stroke, TIA, GERD, hypothyroidism, depression with anxiety, hard of hearing, hiatal hernia, atrial fibrillation on warfarin, breast cancer, bladder cancer, anemia who presented with pain and constipation. 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 Pharmacy has been consulted for electrolyte replacement.   LR @ 47m/hr  Goal of Therapy:  K 4 All other electrolytes WNL  Plan:  --Na 133 - will monitor for now --K 3.8 - will give 40 mEq PO Kcl x1 dose --Recheck electrolytes with AM labs   SSherilyn Banker PharmD Clinical Pharmacist 04/10/2021 7:13 AM

## 2021-04-10 NOTE — Progress Notes (Signed)
ANTICOAGULATION CONSULT NOTE - Follow Up Consult  Pharmacy Consult for Heparin bridge with warfarin Indication: atrial fibrillation  Allergies  Allergen Reactions   Celebrex [Celecoxib] Anaphylaxis   Morphine And Related Swelling    Lips and mouth   Povidone-Iodine Other (See Comments)    Other reaction(s): ITCHING Other reaction(s): ITCHING    Sulfa Antibiotics     Patient Measurements: Height: '5\' 3"'$  (160 cm) Weight: 72.1 kg (158 lb 15.2 oz) IBW/kg (Calculated) : 52.4 Heparin Dosing Weight: 65.9kg  Vital Signs: Temp: 98.8 F (37.1 C) (09/16 0410) Temp Source: Oral (09/16 0410) BP: 153/69 (09/16 0410) Pulse Rate: 100 (09/16 0410)  Labs: Recent Labs    04/08/21 0325 04/08/21 1155 04/08/21 2250 04/09/21 0458 04/09/21 0857 04/09/21 1757 04/10/21 0325  HGB 9.1*  --   --  8.9*  --   --  8.3*  HCT 29.4*  --   --  28.6*  --   --  26.8*  PLT 294  --   --  310  --   --  303  LABPROT  --  17.5*  --  19.4*  --   --  25.8*  INR  --  1.4*  --  1.6*  --   --  2.4*  HEPARINUNFRC  --   --    < >  --  0.14* 0.24* 0.24*  CREATININE 0.68  --   --  0.76  --   --  0.66   < > = values in this interval not displayed.     Estimated Creatinine Clearance: 47.2 mL/min (by C-G formula based on SCr of 0.66 mg/dL).   Medications:  Scheduled:   amLODipine  5 mg Oral Daily   Chlorhexidine Gluconate Cloth  6 each Topical Daily   feeding supplement  237 mL Oral TID BM   irbesartan  300 mg Oral Daily   levothyroxine  112 mcg Oral QAC breakfast   multivitamin with minerals  1 tablet Oral Daily   pantoprazole  40 mg Oral Daily   potassium chloride  40 mEq Oral Once   sertraline  50 mg Oral QHS   Warfarin - Pharmacist Dosing Inpatient   Does not apply q50    Assessment: 87 y.o. female with medical history significant of hypertension, stroke, TIA, GERD, hypothyroidism, depression with anxiety, hard of hearing, hiatal hernia, atrial fibrillation on Coumadin, breast cancer, bladder  cancer, anemia, who presents with abdominal pain and constipation. Pharmacy has been consulted for heparin bridge and warfarin. Baseline CBC was within normal limits.    PTA warfarin regimen: 3 mg on Sat/Sun/Mon/Tue; 4 mg on Wed/Thur/Fri - last dose was 3 mg on 9/6  Goal of Therapy:  INR 2-3 Monitor platelets by anticoagulation protocol: Yes  Warfarin: Date    INR      Comment  9/14     1.4       Subtherapeutic  9/15     1.6       Subtherapeutic  9/16  2.4  Therapeutic - heparin discontinued (9/16)   Heparin: Date    Time    HL       Rate/Comment  9/14     2250    <0.10   Subtherapeutic 1000 > 1200 units/hr 9/15     0857    0.14     Subtherapeutic 1200 > 1400 units/hr 9/15     1816    0.24     Subtherapeutic 1400> 1500 units/hr 9/16  0325  0.24  INR =- 2.4 therapeutic -- will discontinue heparin at this time    Plan:  Heparin subtherapeutic but warfarin therapeutic.  Discontinue heparin at this time INR therapeutic with a 0.8 increase after 2 doses of 5 mg; have not seen full effects of these doses. Will give reduced dose of 2 mg tonight  Monitor CBC per protocol   Sherilyn Banker, PharmD Clinical Pharmacist   04/10/2021 7:16 AM

## 2021-04-10 NOTE — Plan of Care (Signed)
  Problem: Clinical Measurements: Goal: Ability to maintain clinical measurements within normal limits will improve Outcome: Progressing Goal: Will remain free from infection Outcome: Progressing Goal: Diagnostic test results will improve Outcome: Progressing Goal: Respiratory complications will improve Outcome: Progressing Goal: Cardiovascular complication will be avoided Outcome: Progressing   Problem: Pain Managment: Goal: General experience of comfort will improve Outcome: Progressing   Pt is involved in and agrees with the plan of care. V/S stable. Dilaudid IV given for pain on her abdomen. Ileostomy in place; draining liquid brownish output.

## 2021-04-10 NOTE — Progress Notes (Signed)
Progress Note    Gabriela [name]  [id number] DOB: 11-30-1931  DOA: 04/01/2021 PCP: [name], MD      Brief Narrative:    Medical records reviewed and are as summarized below:  Gabriela [name] is a 87 y.o. female with medical history significant for hypertension, TIA, stroke, CAD, hypothyroidism, depression, anxiety, hearing impairment, hiatal hernia, atrial fibrillation on Coumadin, breast cancer, bladder cancer, chronic anemia.  She presented to the hospital with abdominal pain and constipation.  She she was found to have large bowel obstruction secondary to colonic mass concerning for malignancy.  She was treated with IV fluids and analgesics.  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       Assessment/Plan:   Principal Problem:   Large bowel obstruction (HCC) Active Problems:   Abdominal pain   Hypertension   Hypothyroidism   Stroke ([place])   Hypokalemia   Depression with anxiety   Atrial fibrillation, chronic (HCC)   Normocytic anemia   Nutrition Problem: Inadequate oral intake Etiology: acute illness  Signs/Symptoms: other (comment) (pt on clear liquid diet)   Body mass index is 28.16 kg/m.   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  CT abdomen and pelvis from 04/09/2021 reviewed.   Extensive soft tissue emphysema and pneumoperitoneum in the setting of recent laparoscopic partial colectomy:  She has been started on full liquid diet.  Follow-up with general surgeon for further recommendations.  Leukocytosis: WBC is trending down.  Continue to monitor.    Atrial fibrillation and history of stroke: INR is therapeutic.  IV heparin infusion has been discontinued.  Continue Coumadin and monitor INR per protocol.  CHA2DS2-VASc score is 6.  Hypokalemia: Improved  Hyponatremia: Continue IV fluids and monitor  sodium level  Other comorbidities include depression, anxiety, chronic anemia, hypothyroidism, hypertension     Diet Order             Diet full liquid Room service appropriate? Yes; Fluid consistency: Thin  Diet effective now                      Consultants: General surgeon  Procedures: Robotic assisted subtotal colectomy with end ileostomy on 04/06/2021    Medications:    amLODipine  5 mg Oral Daily   Chlorhexidine Gluconate Cloth  6 each Topical Daily   feeding supplement  237 mL Oral TID BM   irbesartan  300 mg Oral Daily   levothyroxine  112 mcg Oral QAC breakfast   multivitamin with minerals  1 tablet Oral Daily   pantoprazole  40 mg Oral Daily   sertraline  50 mg Oral QHS   warfarin  2 mg Oral ONCE-1600   Warfarin - Pharmacist Dosing Inpatient   Does not apply q1600   Continuous Infusions:  lactated ringers 75 mL/hr at 04/10/21 1204     Anti-infectives (From admission, onward)    Start     Dose/Rate Route Frequency Ordered Stop   04/06/21 1320  sodium chloride 0.9 % with cefoTEtan (CEFOTAN) ADS Med       Note to Pharmacy: Maryagnes Amos   : cabinet override      04/06/21 1320 04/06/21 1457   04/06/21 0830  cefoTEtan (CEFOTAN) 2 g in sodium chloride 0.9 % 100 mL IVPB        2 g 200 mL/hr over 30 Minutes Intravenous On call  to O.R. 04/06/21 0740 04/06/21 1452              Family Communication/Anticipated D/C date and plan/Code Status   DVT prophylaxis: SCDs Start: 04/01/21 1207 warfarin (COUMADIN) tablet 2 mg     Code Status: DNR  Family Communication: None Disposition Plan:    Status is: Inpatient  Remains inpatient appropriate because:IV treatments appropriate due to intensity of illness or inability to take PO and Inpatient level of care appropriate due to severity of illness  Dispo: The patient is from: Home              Anticipated d/c is to: Home              Patient currently is not medically stable to d/c.    Difficult to place patient No           Subjective:   Interval events noted.  Abdominal pain is better today.  Objective:    Vitals:   04/09/21 0738 04/09/21 2033 04/10/21 0410 04/10/21 0755  BP: 128/68 (!) 131/54 (!) 153/69 136/63  Pulse: 100 98 100 99  Resp: '19 18 18 16  '$ Temp: 98 F (36.7 C) 99.1 F (37.3 C) 98.8 F (37.1 C) 98.7 F (37.1 C)  TempSrc: Oral  Oral Oral  SpO2: 94% 93% 94% 93%  Weight:      Height:       No data found.   Intake/Output Summary (Last 24 hours) at 04/10/2021 1519 Last data filed at 04/10/2021 1500 Gross per 24 hour  Intake 2145.18 ml  Output 1150 ml  Net 995.18 ml   Filed Weights   04/07/21 0407 04/08/21 0500 04/09/21 0500  Weight: 71.4 kg 72.5 kg 72.1 kg    Exam:  GEN: NAD SKIN: Warm and dry EYES: No pallor or icterus ENT: MMM CV: RRR PULM: CTA B ABD: soft, ND, diffuse tenderness but no rebound tenderness or guarding, +BS, + ileostomy with dark green fluid in ostomy bag. CNS: AAO x 3, non focal EXT: No edema or tenderness         Data Reviewed:   I have personally reviewed following labs and imaging studies:  Labs: Labs show the following:   Basic Metabolic Panel: Recent Labs  Lab 04/07/21 0534 04/07/21 1806 04/08/21 0325 04/09/21 0458 04/10/21 0325  NA 140  --  138 135 133*  K 2.6*   < > 3.3* 4.3 3.8  CL 102  --  103 102 99  CO2 28  --  '27 25 26  '$ GLUCOSE 106*  --  90 135* 111*  BUN 7*  --  14 27* 25*  CREATININE 0.57  --  0.68 0.76 0.66  CALCIUM 8.0*  --  8.2* 8.4* 8.0*  MG  --   --  2.0 2.2 2.3  PHOS  --   --   --  2.3* 4.1   < > = values in this interval not displayed.   GFR Estimated Creatinine Clearance: 47.2 mL/min (by C-G formula based on SCr of 0.66 mg/dL). Liver Function Tests: No results for input(s): AST, ALT, ALKPHOS, BILITOT, PROT, ALBUMIN in the last 168 hours. No results for input(s): LIPASE, AMYLASE in the last 168 hours. No results for input(s): AMMONIA in the last 168  hours. Coagulation profile Recent Labs  Lab 04/05/21 1128 04/06/21 0426 04/08/21 1155 04/09/21 0458 04/10/21 0325  INR 1.6* 1.6* 1.4* 1.6* 2.4*    CBC: Recent Labs  Lab 04/06/21 0426 04/07/21 0534 04/08/21 0325  04/09/21 0458 04/10/21 0325  WBC 5.5 11.6* 23.0* 27.6* 23.9*  HGB 9.2* 8.7* 9.1* 8.9* 8.3*  HCT 29.6* 28.0* 29.4* 28.6* 26.8*  MCV 82.0 82.4 82.8 82.7 82.7  PLT 314 291 294 310 303   Cardiac Enzymes: No results for input(s): CKTOTAL, CKMB, CKMBINDEX, TROPONINI in the last 168 hours. BNP (last 3 results) No results for input(s): PROBNP in the last 8760 hours. CBG: Recent Labs  Lab 04/04/21 0809 04/07/21 0737 04/08/21 0808 04/09/21 0735  GLUCAP 87 102* 88 134*   D-Dimer: No results for input(s): DDIMER in the last 72 hours. Hgb A1c: No results for input(s): HGBA1C in the last 72 hours. Lipid Profile: No results for input(s): CHOL, HDL, LDLCALC, TRIG, CHOLHDL, LDLDIRECT in the last 72 hours. Thyroid function studies: No results for input(s): TSH, T4TOTAL, T3FREE, THYROIDAB in the last 72 hours.  Invalid input(s): FREET3 Anemia work up: No results for input(s): VITAMINB12, FOLATE, FERRITIN, TIBC, IRON, RETICCTPCT in the last 72 hours. Sepsis Labs: Recent Labs  Lab 04/07/21 0534 04/08/21 0325 04/09/21 0458 04/09/21 0902 04/10/21 0325  WBC 11.6* 23.0* 27.6*  --  23.9*  LATICACIDVEN  --   --   --  1.3  --     Microbiology Recent Results (from the past 240 hour(s))  Resp Panel by RT-PCR (Flu A&B, Covid) Nasopharyngeal Swab     Status: None   Collection Time: 04/01/21  2:04 PM   Specimen: Nasopharyngeal Swab; Nasopharyngeal(NP) swabs in vial transport medium  Result Value Ref Range Status   SARS Coronavirus 2 by RT PCR NEGATIVE NEGATIVE Final    Comment: (NOTE) SARS-CoV-2 target nucleic acids are NOT DETECTED.  The SARS-CoV-2 RNA is generally detectable in upper respiratory specimens during the acute phase of infection. The lowest concentration  of SARS-CoV-2 viral copies this assay can detect is 138 copies/mL. A negative result does not preclude SARS-Cov-2 infection and should not be used as the sole basis for treatment or other patient management decisions. A negative result may occur with  improper specimen collection/handling, submission of specimen other than nasopharyngeal swab, presence of viral mutation(s) within the areas targeted by this assay, and inadequate number of viral copies(<138 copies/mL). A negative result must be combined with clinical observations, patient history, and epidemiological information. The expected result is Negative.  Fact Sheet for Patients:  EntrepreneurPulse.com.au  Fact Sheet for Healthcare Providers:  IncredibleEmployment.be  This test is no t yet approved or cleared by the [place] FDA and  has been authorized for detection and/or diagnosis of SARS-CoV-2 by FDA under an Emergency Use Authorization (EUA). This EUA will remain  in effect (meaning this test can be used) for the duration of the COVID-19 declaration under Section 564(b)(1) of the Act, 21 U.S.C.section 360bbb-3(b)(1), unless the authorization is terminated  or revoked sooner.       Influenza A by PCR NEGATIVE NEGATIVE Final   Influenza B by PCR NEGATIVE NEGATIVE Final    Comment: (NOTE) The Xpert Xpress SARS-CoV-2/FLU/RSV plus assay is intended as an aid in the diagnosis of influenza from Nasopharyngeal swab specimens and should not be used as a sole basis for treatment. Nasal washings and aspirates are unacceptable for Xpert Xpress SARS-CoV-2/FLU/RSV testing.  Fact Sheet for Patients: EntrepreneurPulse.com.au  Fact Sheet for Healthcare Providers: IncredibleEmployment.be  This test is not yet approved or cleared by the [place] FDA and has been authorized for detection and/or diagnosis of SARS-CoV-2 by FDA under an Emergency Use  Authorization (EUA). This EUA will remain  in effect (meaning this test can be used) for the duration of the COVID-19 declaration under Section 564(b)(1) of the Act, 21 U.S.C. section 360bbb-3(b)(1), unless the authorization is terminated or revoked.  Performed at [facility], [address] [place], [place] [address]   MRSA Next Gen by PCR, Nasal     Status: None   Collection Time: 04/04/21  5:56 AM   Specimen: Nasal Mucosa; Nasal Swab  Result Value Ref Range Status   MRSA by PCR Next Gen NOT DETECTED NOT DETECTED Final    Comment: (NOTE) The GeneXpert MRSA Assay (FDA approved for NASAL specimens only), is one component of a comprehensive MRSA colonization surveillance program. It is not intended to diagnose MRSA infection nor to guide or monitor treatment for MRSA infections. Test performance is not FDA approved in patients less than 106 years old. Performed at [facility], [address] [place], [place] [address]     Procedures and diagnostic studies:  CT ABDOMEN PELVIS W CONTRAST  Result Date: 04/09/2021 CLINICAL DATA:  3 days postop from subtotal colectomy with ileostomy for obstructing colon carcinoma. Abdominal pain. Suspected abscess. EXAM: CT ABDOMEN AND PELVIS WITH CONTRAST TECHNIQUE: Multidetector CT imaging of the abdomen and pelvis was performed using the standard protocol following bolus administration of intravenous contrast. CONTRAST:  80m OMNIPAQUE IOHEXOL 350 MG/ML SOLN COMPARISON:  04/01/2021 FINDINGS: Lower Chest: Increased compressive atelectasis seen in the right lung base. New ill-defined nodular opacity in the posterior left lower lobe since prior study 1 week ago, consistent with infectious or inflammatory etiology. A lobulated soft tissue mass is also seen in the medial left breast which measures 3.3 x 2.4 cm. Stable cardiomegaly and small pericardial effusion. Hepatobiliary: No hepatic masses identified. Prior cholecystectomy. No  evidence of biliary obstruction. Pancreas:  No mass or inflammatory changes. Spleen: Within normal limits in size and appearance. Adrenals/Urinary Tract: No masses identified. Bilateral renal cysts remains stable. No evidence of ureteral calculi or hydronephrosis. Unremarkable unopacified urinary bladder. Stomach/Bowel: Patient has undergone subtotal colectomy with right abdominal ileostomy since prior exam. Small amount of free intraperitoneal air is seen which is attributed to recent surgery. Severe diverticulosis is again seen involving the residual portion of the sigmoid colon. There is increased diffuse haustral fold thickening of the sigmoid colon, although there is no evidence of acute diverticulitis. This is nonspecific, and differential diagnosis includes infectious and ischemic colitis. Diffuse moderate to severe small bowel dilatation is seen to the site of the right abdominal ileostomy. No evidence of pneumatosis, focal inflammatory process, or abscess. Mild ascites is noted. Vascular/Lymphatic: No pathologically enlarged lymph nodes. No acute vascular findings. Aortic atherosclerotic calcification noted. Reproductive: Prior hysterectomy noted. Adnexal regions are unremarkable in appearance. Other: Severe subcutaneous emphysema is seen throughout the chest and abdominal wall soft tissues, which is much greater than typically seen postoperatively. Musculoskeletal:  No suspicious bone lesions identified. IMPRESSION: Severe subcutaneous emphysema throughout the chest and abdominal wall soft tissues, much greater than typically seen postoperatively. Gas-forming infection/necrotizing fasciitis cannot be excluded; clinical correlation is recommended. Status post subtotal colectomy and right abdominal ileostomy, with small amount of free air, which is likely postoperative in nature. Diffuse moderate to severe small bowel dilatation to the site of the right abdominal ileostomy, likely due to severe ileus  although obstruction at the ostomy site cannot be excluded. No evidence of pneumatosis. Increased haustral fold thickening involving the residual sigmoid colon, which shows severe diverticulosis. This appearance is not typical for diverticulitis, and differential  diagnosis includes infectious and ischemic colitis. Mild ascites. No evidence of abscess. 3.3 cm lobulated soft tissue mass in medial left breast. Malignancy cannot be excluded. Recommend dedicated breast imaging evaluation for further characterization. These results were called by telephone at the time of interpretation on 04/09/2021 at 3:23 pm to provider Dr. Ronny Bacon , who verbally acknowledged these results. Electronically Signed   By: Marlaine Hind M.D.   On: 04/09/2021 15:23               LOS: 9 days   Marycatherine Maniscalco  [facility]   Pager on [email or web address]. If 7PM-7AM, please contact night-coverage at [email or web address]     04/10/2021, 3:19 PM

## 2021-04-10 NOTE — Progress Notes (Addendum)
[place] Hospital Day(s): 9.   Post op day(s): 4 Days Post-Op.   Interval History:  Patient seen and examined No acute events or new complaints overnight.  Patient reports she continues to get intermittent "waves" of intense pain that then cease spontaneously or with pain medications No nausea, emesis Leukocytosis is improved this morning; down to 23.9K; no fevers Hgb remains stable at 8.3 Renal function remains normal; sCr - 0.66; UO - unmeasured No electrolyte derangements She was returned to NPO yesterday She has had return of ileostomy function; 700 ccs + unmeasured in last 24 hours  CT yesterday was concerning for possible ileus vs obstruction at level of ileostomy, red rubber placed which seems to have improved this  Vital signs in last 24 hours: [min-max] current  Temp:  [98 F (36.7 C)-99.1 F (37.3 C)] 98.8 F (37.1 C) (09/16 0410) Pulse Rate:  [98-100] 100 (09/16 0410) Resp:  [18-19] 18 (09/16 0410) BP: (128-153)/(54-69) 153/69 (09/16 0410) SpO2:  [93 %-94 %] 94 % (09/16 0410)     Height: '5\' 3"'$  (160 cm) Weight: 72.1 kg BMI (Calculated): 28.16   Intake/Output last 2 shifts:  09/15 0701 - 09/16 0700 In: 2248.5 [P.O.:480; I.V.:1768.5] Out: 700 [Stool:700]   Physical Exam:  Constitutional: alert, cooperative and no distress  Respiratory: breathing non-labored at rest  Cardiovascular: regular rate and sinus rhythm  Gastrointestinal: Soft, she is somewhat diffusely tender, no rebound/guarding, she does not appear peritonitic.  There is palpable crepitus laterally and over lower ribs. Ileostomy in the right abdomen, dusky/congested, no gas, small amount of liquid stool and bile in bag, newly placed red rubber Integumentary: Laparoscopic incisions are CDI with dermabond, no erythema or drainage   Labs:  CBC Latest Ref Rng & Units 04/10/2021 04/09/2021 04/08/2021  WBC 4.0 - 10.5 K/uL 23.9(H) 27.6(H) 23.0(H)  Hemoglobin 12.0 -  15.0 g/dL 8.3(L) 8.9(L) 9.1(L)  Hematocrit 36.0 - 46.0 % 26.8(L) 28.6(L) 29.4(L)  Platelets 150 - 400 K/uL 303 310 294   CMP Latest Ref Rng & Units 04/10/2021 04/09/2021 04/08/2021  Glucose 70 - 99 mg/dL 111(H) 135(H) 90  BUN 8 - 23 mg/dL 25(H) 27(H) 14  Creatinine 0.44 - 1.00 mg/dL 0.66 0.76 0.68  Sodium 135 - 145 mmol/L 133(L) 135 138  Potassium 3.5 - 5.1 mmol/L 3.8 4.3 3.3(L)  Chloride 98 - 111 mmol/L 99 102 103  CO2 22 - 32 mmol/L '26 25 27  '$ Calcium 8.9 - 10.3 mg/dL 8.0(L) 8.4(L) 8.2(L)  Total Protein 6.5 - 8.1 g/dL - - -  Total Bilirubin 0.3 - 1.2 mg/dL - - -  Alkaline Phos 38 - 126 U/L - - -  AST 15 - 41 U/L - - -  ALT 0 - 44 U/L - - -     Imaging studies:   CT Abdomen/Pelvis (04/09/2021) personally reviewed and radiologist report reviewed below:  IMPRESSION: Severe subcutaneous emphysema throughout the chest and abdominal wall soft tissues, much greater than typically seen postoperatively. Gas-forming infection/necrotizing fasciitis cannot be excluded; clinical correlation is recommended.   Status post subtotal colectomy and right abdominal ileostomy, with small amount of free air, which is likely postoperative in nature.   Diffuse moderate to severe small bowel dilatation to the site of the right abdominal ileostomy, likely due to severe ileus although obstruction at the ostomy site cannot be excluded. No evidence of pneumatosis.   Increased haustral fold thickening involving the residual sigmoid colon, which shows severe diverticulosis. This appearance is not  typical for diverticulitis, and differential diagnosis includes infectious and ischemic colitis.   Mild ascites. No evidence of abscess.   3.3 cm lobulated soft tissue mass in medial left breast. Malignancy cannot be excluded. Recommend dedicated breast imaging evaluation for further characterization.   Assessment/Plan:  87 y.o. female now with ileostomy output 4 Days Post-Op s/p robotic assisted  laparoscopic subtotal colectomy with end ileostomy for obstructing transverse colon mass.   - Continue NPO for now; I think it is reasonable to see how she tolerates water + Boost breeze sips. If we are unable to significantly advance diet in next 24-48 hours, we may need to consider parenteral nutrition  - Monitor leukocytosis; improving - Monitor abdominal examination; on-going ileostomy function - Appreciate WOC RN for ileostomy teaching               - Early mobilization encouraged as tolerated; PT following              - Okay to continue Heparin gtt             - Further management per primary service; we will follow   All of the above findings and recommendations were discussed with the patient, and the medical team, and all of patient's questions were answered to her expressed satisfaction.  -- Gabriela Simon, PA-C [facility] 04/10/2021, 7:29 AM [phone] M-F: 7am - 4pm

## 2021-04-10 NOTE — Progress Notes (Signed)
ANTICOAGULATION CONSULT NOTE - Follow Up Consult  Pharmacy Consult for Heparin bridge with warfarin Indication: atrial fibrillation  Allergies  Allergen Reactions   Celebrex [Celecoxib] Anaphylaxis   Morphine And Related Swelling    Lips and mouth   Povidone-Iodine Other (See Comments)    Other reaction(s): ITCHING Other reaction(s): ITCHING    Sulfa Antibiotics     Patient Measurements: Height: '5\' 3"'$  (160 cm) Weight: 72.1 kg (158 lb 15.2 oz) IBW/kg (Calculated) : 52.4 Heparin Dosing Weight: 65.9kg  Vital Signs: Temp: 99.1 F (37.3 C) (09/15 2033) BP: 131/54 (09/15 2033) Pulse Rate: 98 (09/15 2033)  Labs: Recent Labs    04/07/21 0534 04/08/21 0325 04/08/21 1155 04/08/21 2250 04/09/21 0458 04/09/21 0857 04/09/21 1757 04/10/21 0325  HGB 8.7* 9.1*  --   --  8.9*  --   --  8.3*  HCT 28.0* 29.4*  --   --  28.6*  --   --  26.8*  PLT 291 294  --   --  310  --   --  303  LABPROT  --   --  17.5*  --  19.4*  --   --  25.8*  INR  --   --  1.4*  --  1.6*  --   --  2.4*  HEPARINUNFRC  --   --   --    < >  --  0.14* 0.24* 0.24*  CREATININE 0.57 0.68  --   --  0.76  --   --   --    < > = values in this interval not displayed.     Estimated Creatinine Clearance: 47.2 mL/min (by C-G formula based on SCr of 0.76 mg/dL).   Medications:  Scheduled:   amLODipine  5 mg Oral Daily   Chlorhexidine Gluconate Cloth  6 each Topical Daily   feeding supplement  237 mL Oral TID BM   irbesartan  300 mg Oral Daily   levothyroxine  112 mcg Oral QAC breakfast   multivitamin with minerals  1 tablet Oral Daily   pantoprazole  40 mg Oral Daily   sertraline  50 mg Oral QHS   Warfarin - Pharmacist Dosing Inpatient   Does not apply q40    Assessment: 87 y.o. female with medical history significant of hypertension, stroke, TIA, GERD, hypothyroidism, depression with anxiety, hard of hearing, hiatal hernia, atrial fibrillation on Coumadin, breast cancer, bladder cancer, anemia, who  presents with abdominal pain and constipation. Pharmacy has been consulted for heparin bridge and warfarin. Baseline CBC was within normal limits.    PTA warfarin regimen: 3 mg on Sat/Sun/Mon/Tue; 4 mg on Wed/Thur/Fri - last dose was 3 mg on 9/6  Goal of Therapy:  INR 2-3 Heparin level 0.3-0.7 units/ml Monitor platelets by anticoagulation protocol: Yes  Warfarin: Date    INR      Comment  9/14     1.4       Subtherapeutic  9/15     1.6       Subtherapeutic    Heparin: Date    Time    HL       Rate/Comment  9/14     2250    <0.10   Subtherapeutic 1000 > 1200 units/hr 9/15     0857    0.14     Subtherapeutic 1200 > 1400 units/hr 9/15     1816    0.24     Subtherapeutic 1400> 1500 units/hr 9/16 0325 0.24 INR =- 2.4 therapeutic --  will discontinue heparin at this time    Plan:  Heparin subtherapeutic but warfarin therapeutic.  Discontinue heparin at this time Continue warfarin therapy per pharmacy  Monitor CBC per protocol   Dorothe Pea, PharmD, BCPS Clinical Pharmacist   04/10/2021 4:04 AM

## 2021-04-10 NOTE — Progress Notes (Signed)
Mobility Specialist - Progress Note   04/10/21 0900  Mobility  Activity Refused mobility  Mobility performed by Mobility specialist    Pt sleeping on arrival, awakened by voice. Politely declined mobility, no reason specified. Will attempt session another date/time.    Kathee Delton Mobility Specialist 04/10/21, 9:56 AM

## 2021-04-10 NOTE — TOC Progression Note (Signed)
Transition of Care [facility]) - Progression Note    Patient Details  Name: Gabriela [name] MRN: [id number] Date of Birth: 1931-12-14  Transition of Care [facility]) CM/SW Contact  Shelbie Hutching, RN Phone Number: 04/10/2021, 1:59 PM  Clinical Narrative:    RNCM reached out again today to [facility] and the report they will have nursing look at it to see if they can offer a bed. Family reports that [facility] is still their first choice but 2nd choice is [facility] and 3rd choice [facility] in [place].     Expected Discharge Plan: [place] Barriers to Discharge: Continued Medical Work up  Expected Discharge Plan and Services Expected Discharge Plan: [place] Choice: [place] arrangements for the past 2 months: Single Family Home                                       Social Determinants of Health (SDOH) Interventions    Readmission Risk Interventions No flowsheet data found.

## 2021-04-10 NOTE — Progress Notes (Signed)
PT Cancellation Note  Patient Details Name: Gabriela [name] MRN: [id number] DOB: 1931/10/05   Cancelled Treatment:    Reason Eval/Treat Not Completed: Other (comment). Pt received supine in bed, lights out, family in room. Pt states she is not feeling well and just got comfortable in bed. She politely declined PT stating she would like to rest. PT will follow up at a later date.   Patrina Levering PT, DPT 04/10/21 4:23 PM [phone]

## 2021-04-11 DIAGNOSIS — K56609 Unspecified intestinal obstruction, unspecified as to partial versus complete obstruction: Secondary | ICD-10-CM | POA: Diagnosis not present

## 2021-04-11 LAB — CBC WITH DIFFERENTIAL/PLATELET
Abs Immature Granulocytes: 0.19 10*3/uL — ABNORMAL HIGH (ref 0.00–0.07)
Basophils Absolute: 0.1 10*3/uL (ref 0.0–0.1)
Basophils Relative: 0 %
Eosinophils Absolute: 0.1 10*3/uL (ref 0.0–0.5)
Eosinophils Relative: 0 %
HCT: 26.1 % — ABNORMAL LOW (ref 36.0–46.0)
Hemoglobin: 8.3 g/dL — ABNORMAL LOW (ref 12.0–15.0)
Immature Granulocytes: 1 %
Lymphocytes Relative: 3 %
Lymphs Abs: 0.7 10*3/uL (ref 0.7–4.0)
MCH: 26.4 pg (ref 26.0–34.0)
MCHC: 31.8 g/dL (ref 30.0–36.0)
MCV: 83.1 fL (ref 80.0–100.0)
Monocytes Absolute: 1.8 10*3/uL — ABNORMAL HIGH (ref 0.1–1.0)
Monocytes Relative: 8 %
Neutro Abs: 20.3 10*3/uL — ABNORMAL HIGH (ref 1.7–7.7)
Neutrophils Relative %: 88 %
Platelets: 314 10*3/uL (ref 150–400)
RBC: 3.14 MIL/uL — ABNORMAL LOW (ref 3.87–5.11)
RDW: 14.7 % (ref 11.5–15.5)
WBC: 23.1 10*3/uL — ABNORMAL HIGH (ref 4.0–10.5)
nRBC: 0 % (ref 0.0–0.2)

## 2021-04-11 LAB — BASIC METABOLIC PANEL
Anion gap: 6 (ref 5–15)
BUN: 18 mg/dL (ref 8–23)
CO2: 26 mmol/L (ref 22–32)
Calcium: 8.1 mg/dL — ABNORMAL LOW (ref 8.9–10.3)
Chloride: 103 mmol/L (ref 98–111)
Creatinine, Ser: 0.61 mg/dL (ref 0.44–1.00)
GFR, Estimated: >60 mL/min (ref >60)
Glucose, Bld: 124 mg/dL — ABNORMAL HIGH (ref 70–99)
Potassium: 3.9 mmol/L (ref 3.5–5.1)
Sodium: 135 mmol/L (ref 135–145)

## 2021-04-11 LAB — PHOSPHORUS: Phosphorus: 3.6 mg/dL (ref 2.5–4.6)

## 2021-04-11 LAB — GLUCOSE, CAPILLARY: Glucose-Capillary: 125 mg/dL — ABNORMAL HIGH (ref 70–99)

## 2021-04-11 LAB — PROTIME-INR
INR: 2.9 — ABNORMAL HIGH (ref 0.8–1.2)
Prothrombin Time: 30.7 seconds — ABNORMAL HIGH (ref 11.4–15.2)

## 2021-04-11 LAB — MAGNESIUM: Magnesium: 2.2 mg/dL (ref 1.7–2.4)

## 2021-04-11 MED ORDER — POTASSIUM CHLORIDE 20 MEQ PO PACK
40.0000 meq | PACK | Freq: Once | ORAL | Status: AC
  Administered 2021-04-11: 40 meq via ORAL
  Filled 2021-04-11: qty 2

## 2021-04-11 NOTE — Progress Notes (Signed)
ANTICOAGULATION CONSULT NOTE - Follow Up Consult  Pharmacy Consult for warfarin Indication: atrial fibrillation  Allergies  Allergen Reactions   Celebrex [Celecoxib] Anaphylaxis   Morphine And Related Swelling    Lips and mouth   Povidone-Iodine Other (See Comments)    Other reaction(s): ITCHING Other reaction(s): ITCHING    Sulfa Antibiotics     Patient Measurements: Height: '5\' 3"'$  (160 cm) Weight: 71.7 kg (158 lb 1.1 oz) IBW/kg (Calculated) : 52.4 Heparin Dosing Weight: 65.9kg  Vital Signs: Temp: 98.8 F (37.1 C) (09/17 0754) Temp Source: Oral (09/17 0754) BP: 133/63 (09/17 0754) Pulse Rate: 88 (09/17 0754)  Labs: Recent Labs    04/09/21 0458 04/09/21 0857 04/09/21 1757 04/10/21 0325 04/11/21 0435  HGB 8.9*  --   --  8.3* 8.3*  HCT 28.6*  --   --  26.8* 26.1*  PLT 310  --   --  303 314  LABPROT 19.4*  --   --  25.8* 30.7*  INR 1.6*  --   --  2.4* 2.9*  HEPARINUNFRC  --  0.14* 0.24* 0.24*  --   CREATININE 0.76  --   --  0.66 0.61     Estimated Creatinine Clearance: 47 mL/min (by C-G formula based on SCr of 0.61 mg/dL).   Medications:  Scheduled:   amLODipine  5 mg Oral Daily   Chlorhexidine Gluconate Cloth  6 each Topical Daily   feeding supplement  237 mL Oral TID BM   irbesartan  300 mg Oral Daily   levothyroxine  112 mcg Oral QAC breakfast   multivitamin with minerals  1 tablet Oral Daily   pantoprazole  40 mg Oral Daily   potassium chloride  40 mEq Oral Once   sertraline  50 mg Oral QHS   Warfarin - Pharmacist Dosing Inpatient   Does not apply q55    Assessment: 87 y.o. female with medical history significant of hypertension, stroke, TIA, GERD, hypothyroidism, depression with anxiety, hard of hearing, hiatal hernia, atrial fibrillation on Coumadin, breast cancer, bladder cancer, anemia, who presents with abdominal pain and constipation. Pharmacy has been consulted for heparin bridge and warfarin. Baseline CBC was within normal limits.    PTA  warfarin regimen: 3 mg on Sat/Sun/Mon/Tue; 4 mg on Wed/Thur/Fri - last dose was 3 mg on 9/6  Goal of Therapy:  INR 2-3 Monitor platelets by anticoagulation protocol: Yes  Warfarin: Date    INR      Comment  9/14     1.4       Subtherapeutic - gave 5 mg 9/15     1.6       Subtherapeutic - gave 5 mg 9/16  2.4  Therapeutic - gave 2 mg 9/17   2.9  Therapeutic - HOLD      Plan:  INR is therapeutic, but in the upper limit of normal. Will hold warfarin tonight. Daily INR ordered. CBC at least every 3 days.    Oswald Hillock, PharmD Clinical Pharmacist   04/11/2021 9:10 AM

## 2021-04-11 NOTE — Progress Notes (Signed)
PHARMACY CONSULT NOTE   Pharmacy Consult for Electrolyte Monitoring and Replacement   Recent Labs: Potassium (mmol/L)  Date Value  04/11/2021 3.9  11/16/2012 3.3 (L)   Magnesium (mg/dL)  Date Value  04/11/2021 2.2   Calcium (mg/dL)  Date Value  04/11/2021 8.1 (L)   Calcium, Total (mg/dL)  Date Value  11/16/2012 8.8   Albumin (g/dL)  Date Value  04/01/2021 3.5  08/17/2011 3.7   Phosphorus (mg/dL)  Date Value  04/11/2021 3.6   Sodium (mmol/L)  Date Value  04/11/2021 135  11/16/2012 139   Corr Ca: 8.8 mg/dL  Assessment: Gabriela [name] with PMH of HTN, stroke, TIA, GERD, hypothyroidism, depression with anxiety, hard of hearing, hiatal hernia, atrial fibrillation on warfarin, breast cancer, bladder cancer, anemia who presented with pain and constipation. 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 Pharmacy has been consulted for electrolyte replacement.   LR @ 44m/hr  Goal of Therapy:  K 4 All other electrolytes WNL  Plan:  Will give Kcl 40 mEq x 1.  --K 3.8 - will give 40 mEq PO Kcl x1 dose --Recheck electrolytes with AM labs   KOswald Hillock PharmD Clinical Pharmacist 04/11/2021 9:08 AM

## 2021-04-11 NOTE — Progress Notes (Signed)
Mobility Specialist - Progress Note   04/11/21 1300  Mobility  Activity Refused mobility  Mobility performed by Mobility specialist    Pt sleeping on arrival, request to rest at this time. Will attempt another date.    Kathee Delton Mobility Specialist 04/11/21, 1:48 PM

## 2021-04-11 NOTE — Progress Notes (Signed)
[place] [facility] Hospital Day(s): 10.   Post op day(s): 5 Days Post-Op.   Interval History:  Patient seen and examined No acute events or new complaints overnight.  No nausea, emesis Leukocytosis is stable this morning; at 23.1K; no fevers Hgb remains stable at 8.3 Renal function remains normal; sCr -essentially unchanged; UO - unmeasured No electrolyte derangements She continues to have significant anorexia, tolerating sips of clears/full liquids. She has had return of ileostomy function; 900 ccs + some leakage in last 24 hours   Vital signs in last 24 hours: [min-max] current  Temp:  [98.1 F (36.7 C)-99.5 F (37.5 C)] 98.8 F (37.1 C) (09/17 0754) Pulse Rate:  [88-109] 88 (09/17 0754) Resp:  [18-21] 20 (09/17 0754) BP: (125-149)/(48-74) 133/63 (09/17 0754) SpO2:  [91 %-94 %] 94 % (09/17 0754) Weight:  [71.7 kg] 71.7 kg (09/17 0500)     Height: '5\' 3"'$  (160 cm) Weight: 71.7 kg BMI (Calculated): 28.01   Intake/Output last 2 shifts:  09/16 0701 - 09/17 0700 In: 1093.7 [P.O.:240; I.V.:853.7] Out: 1120 [Urine:200; Stool:920]   Physical Exam:  Constitutional: alert, cooperative and no distress  Respiratory: breathing non-labored at rest  Cardiovascular: regular rate and sinus rhythm  Gastrointestinal: Soft, she remains voluntarily diffusely tender, no rebound/guarding, she is not peritonitic.  Ileostomy in the right abdomen, edematous, no gas, small amount of liquid bile in bag, red Robinson catheter seems quite mobile, and seems to continue to assist with stenting.  Abdomen still distended, possibly exacerbated by degree of intraperitoneal fluid. Integumentary: Laparoscopic incisions are CDI with dermabond, no erythema or drainage   Labs:  CBC Latest Ref Rng & Units 04/11/2021 04/10/2021 04/09/2021  WBC 4.0 - 10.5 K/uL 23.1(H) 23.9(H) 27.6(H)  Hemoglobin 12.0 - 15.0 g/dL 8.3(L) 8.3(L) 8.9(L)  Hematocrit 36.0 - 46.0 % 26.1(L) 26.8(L) 28.6(L)   Platelets 150 - 400 K/uL 314 303 310   CMP Latest Ref Rng & Units 04/11/2021 04/10/2021 04/09/2021  Glucose 70 - 99 mg/dL 124(H) 111(H) 135(H)  BUN 8 - 23 mg/dL 18 25(H) 27(H)  Creatinine 0.44 - 1.00 mg/dL 0.61 0.66 0.76  Sodium 135 - 145 mmol/L 135 133(L) 135  Potassium 3.5 - 5.1 mmol/L 3.9 3.8 4.3  Chloride 98 - 111 mmol/L 103 99 102  CO2 22 - 32 mmol/L '26 26 25  '$ Calcium 8.9 - 10.3 mg/dL 8.1(L) 8.0(L) 8.4(L)  Total Protein 6.5 - 8.1 g/dL - - -  Total Bilirubin 0.3 - 1.2 mg/dL - - -  Alkaline Phos 38 - 126 U/L - - -  AST 15 - 41 U/L - - -  ALT 0 - 44 U/L - - -     Imaging studies:   CT Abdomen/Pelvis (04/09/2021) personally reviewed and radiologist report reviewed below:  IMPRESSION: Severe subcutaneous emphysema throughout the chest and abdominal wall soft tissues, much greater than typically seen postoperatively. Gas-forming infection/necrotizing fasciitis cannot be excluded; clinical correlation is recommended.   Status post subtotal colectomy and right abdominal ileostomy, with small amount of free air, which is likely postoperative in nature.   Diffuse moderate to severe small bowel dilatation to the site of the right abdominal ileostomy, likely due to severe ileus although obstruction at the ostomy site cannot be excluded. No evidence of pneumatosis.   Increased haustral fold thickening involving the residual sigmoid colon, which shows severe diverticulosis. This appearance is not typical for diverticulitis, and differential diagnosis includes infectious and ischemic colitis.   Mild ascites. No evidence of  abscess.   3.3 cm lobulated soft tissue mass in medial left breast. Malignancy cannot be excluded. Recommend dedicated breast imaging evaluation for further characterization.   Assessment/Plan:  87 y.o. female now with ileostomy output 5 Days Post-Op s/p robotic assisted laparoscopic subtotal colectomy with end ileostomy for obstructing transverse colon  mass.   -We will liberalize diet, I believe having options might encourage her appetite; If we are unable to significantly advance diet in next 24-48 hours, we may need to consider parenteral nutrition  - Monitor leukocytosis; uncertain etiology at this point. - Monitor abdominal examination; on-going ileostomy function             - Early mobilization encouraged, she has politely deferred PT's assistance and needs to cooperate more with her mobilization; PT following              - Further management per primary service; we will follow   -She appears to be well-hydrated again, and it may be wise to wean IV fluids/convert to [place].  All of the above findings and recommendations were discussed with the patient, and the medical team, and all of patient's questions were answered to her expressed satisfaction.  -- Ronny Bacon, M.D., [facility]  04/11/2021 ; 11:03 AM

## 2021-04-11 NOTE — Progress Notes (Addendum)
Progress Note    Gabriela [name]  [id number] DOB: November 16, 1931  DOA: 04/01/2021 PCP: [name], MD      Brief Narrative:    Medical records reviewed and are as summarized below:  Gabriela [name] is a 87 y.o. female with medical history significant for hypertension, TIA, stroke, CAD, hypothyroidism, depression, anxiety, hearing impairment, hiatal hernia, atrial fibrillation on Coumadin, breast cancer, bladder cancer, chronic anemia.  She presented to the hospital with abdominal pain and constipation.  She she was found to have large bowel obstruction secondary to colonic mass concerning for malignancy.  She was treated with IV fluids and analgesics.  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       Assessment/Plan:   Principal Problem:   Large bowel obstruction (HCC) Active Problems:   Abdominal pain   Hypertension   Hypothyroidism   Stroke ([place])   Hypokalemia   Depression with anxiety   Atrial fibrillation, chronic (HCC)   Normocytic anemia   Nutrition Problem: Inadequate oral intake Etiology: acute illness  Signs/Symptoms: other (comment) (pt on clear liquid diet)   Body mass index is 28 kg/m.   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  CT abdomen and pelvis from 04/09/2021 reviewed.   Extensive soft tissue emphysema and pneumoperitoneum in the setting of recent laparoscopic partial colectomy:  She is still on full liquid diet and IV fluids.  Follow-up with general surgeon for further recommendations.  Leukocytosis: WBC is slowly trending down.  Continue to monitor.    Atrial fibrillation and history of stroke: INR is therapeutic.  Continue Coumadin and monitor INR per protocol.  CHA2DS2-VASc score is 6.  Hypokalemia and hyponatremia: Improved  Other comorbidities include depression, anxiety, chronic anemia,  hypothyroidism, hypertension     Diet Order             Diet full liquid Room service appropriate? Yes; Fluid consistency: Thin  Diet effective now                      Consultants: General surgeon  Procedures: Robotic assisted subtotal colectomy with end ileostomy on 04/06/2021    Medications:    amLODipine  5 mg Oral Daily   Chlorhexidine Gluconate Cloth  6 each Topical Daily   feeding supplement  237 mL Oral TID BM   irbesartan  300 mg Oral Daily   levothyroxine  112 mcg Oral QAC breakfast   multivitamin with minerals  1 tablet Oral Daily   pantoprazole  40 mg Oral Daily   sertraline  50 mg Oral QHS   Warfarin - Pharmacist Dosing Inpatient   Does not apply q1600   Continuous Infusions:  lactated ringers 75 mL/hr at 04/11/21 0141     Anti-infectives (From admission, onward)    Start     Dose/Rate Route Frequency Ordered Stop   04/06/21 1320  sodium chloride 0.9 % with cefoTEtan (CEFOTAN) ADS Med       Note to Pharmacy: Maryagnes Amos   : cabinet override      04/06/21 1320 04/06/21 1457   04/06/21 0830  cefoTEtan (CEFOTAN) 2 g in sodium chloride 0.9 % 100 mL IVPB        2 g 200 mL/hr over 30 Minutes Intravenous On call to O.R. 04/06/21 0740 04/06/21 1452  Family Communication/Anticipated D/C date and plan/Code Status   DVT prophylaxis: SCDs Start: 04/01/21 1207     Code Status: DNR  Family Communication: Darrell, son, at the bedside Disposition Plan:    Status is: Inpatient  Remains inpatient appropriate because:IV treatments appropriate due to intensity of illness or inability to take PO and Inpatient level of care appropriate due to severity of illness  Dispo: The patient is from: Home              Anticipated d/c is to: SNF              Patient currently is not medically stable to d/c.   Difficult to place patient No           Subjective:   No new complaints.  Abdominal pain is better.  She is  tolerating her diet.  Her son was at the bedside.  Objective:    Vitals:   04/10/21 2001 04/11/21 0400 04/11/21 0500 04/11/21 0754  BP: (!) 149/74 (!) 125/51  133/63  Pulse: (!) 109 95  88  Resp: 18 (!) 21  20  Temp: 99.1 F (37.3 C) 99.5 F (37.5 C)  98.8 F (37.1 C)  TempSrc: Oral Oral  Oral  SpO2: 91% 93%  94%  Weight:   71.7 kg   Height:       No data found.   Intake/Output Summary (Last 24 hours) at 04/11/2021 1300 Last data filed at 04/11/2021 1039 Gross per 24 hour  Intake 1093.7 ml  Output 520 ml  Net 573.7 ml   Filed Weights   04/08/21 0500 04/09/21 0500 04/11/21 0500  Weight: 72.5 kg 72.1 kg 71.7 kg    Exam:  GEN: NAD SKIN: Warm and dry EYES: EOMI ENT: MMM CV: RRR PULM: CTA B ABD: soft, ND, abdomen is less tender, +BS, ileostomy with dark green fluid in ostomy bag CNS: AAO x 3, non focal EXT: No edema or tenderness        Data Reviewed:   I have personally reviewed following labs and imaging studies:  Labs: Labs show the following:   Basic Metabolic Panel: Recent Labs  Lab 04/07/21 0534 04/07/21 1806 04/08/21 0325 04/09/21 0458 04/10/21 0325 04/11/21 0435  NA 140  --  138 135 133* 135  K 2.6*   < > 3.3* 4.3 3.8 3.9  CL 102  --  103 102 99 103  CO2 28  --  '27 25 26 26  '$ GLUCOSE 106*  --  90 135* 111* 124*  BUN 7*  --  14 27* 25* 18  CREATININE 0.57  --  0.68 0.76 0.66 0.61  CALCIUM 8.0*  --  8.2* 8.4* 8.0* 8.1*  MG  --   --  2.0 2.2 2.3 2.2  PHOS  --   --   --  2.3* 4.1 3.6   < > = values in this interval not displayed.   GFR Estimated Creatinine Clearance: 47 mL/min (by C-G formula based on SCr of 0.61 mg/dL). Liver Function Tests: No results for input(s): AST, ALT, ALKPHOS, BILITOT, PROT, ALBUMIN in the last 168 hours. No results for input(s): LIPASE, AMYLASE in the last 168 hours. No results for input(s): AMMONIA in the last 168 hours. Coagulation profile Recent Labs  Lab 04/06/21 0426 04/08/21 1155 04/09/21 0458  04/10/21 0325 04/11/21 0435  INR 1.6* 1.4* 1.6* 2.4* 2.9*    CBC: Recent Labs  Lab 04/07/21 0534 04/08/21 0325 04/09/21 0458 04/10/21 0325 04/11/21 0435  WBC 11.6* 23.0* 27.6* 23.9* 23.1*  NEUTROABS  --   --   --   --  20.3*  HGB 8.7* 9.1* 8.9* 8.3* 8.3*  HCT 28.0* 29.4* 28.6* 26.8* 26.1*  MCV 82.4 82.8 82.7 82.7 83.1  PLT 291 294 310 303 314   Cardiac Enzymes: No results for input(s): CKTOTAL, CKMB, CKMBINDEX, TROPONINI in the last 168 hours. BNP (last 3 results) No results for input(s): PROBNP in the last 8760 hours. CBG: Recent Labs  Lab 04/07/21 0737 04/08/21 0808 04/09/21 0735 04/11/21 0757  GLUCAP 102* 88 134* 125*   D-Dimer: No results for input(s): DDIMER in the last 72 hours. Hgb A1c: No results for input(s): HGBA1C in the last 72 hours. Lipid Profile: No results for input(s): CHOL, HDL, LDLCALC, TRIG, CHOLHDL, LDLDIRECT in the last 72 hours. Thyroid function studies: No results for input(s): TSH, T4TOTAL, T3FREE, THYROIDAB in the last 72 hours.  Invalid input(s): FREET3 Anemia work up: No results for input(s): VITAMINB12, FOLATE, FERRITIN, TIBC, IRON, RETICCTPCT in the last 72 hours. Sepsis Labs: Recent Labs  Lab 04/08/21 0325 04/09/21 0458 04/09/21 0902 04/10/21 0325 04/11/21 0435  WBC 23.0* 27.6*  --  23.9* 23.1*  LATICACIDVEN  --   --  1.3  --   --     Microbiology Recent Results (from the past 240 hour(s))  Resp Panel by RT-PCR (Flu A&B, Covid) Nasopharyngeal Swab     Status: None   Collection Time: 04/01/21  2:04 PM   Specimen: Nasopharyngeal Swab; Nasopharyngeal(NP) swabs in vial transport medium  Result Value Ref Range Status   SARS Coronavirus 2 by RT PCR NEGATIVE NEGATIVE Final    Comment: (NOTE) SARS-CoV-2 target nucleic acids are NOT DETECTED.  The SARS-CoV-2 RNA is generally detectable in upper respiratory specimens during the acute phase of infection. The lowest concentration of SARS-CoV-2 viral copies this assay can detect  is 138 copies/mL. A negative result does not preclude SARS-Cov-2 infection and should not be used as the sole basis for treatment or other patient management decisions. A negative result may occur with  improper specimen collection/handling, submission of specimen other than nasopharyngeal swab, presence of viral mutation(s) within the areas targeted by this assay, and inadequate number of viral copies(<138 copies/mL). A negative result must be combined with clinical observations, patient history, and epidemiological information. The expected result is Negative.  Fact Sheet for Patients:  EntrepreneurPulse.com.au  Fact Sheet for Healthcare Providers:  IncredibleEmployment.be  This test is no t yet approved or cleared by the [place] FDA and  has been authorized for detection and/or diagnosis of SARS-CoV-2 by FDA under an Emergency Use Authorization (EUA). This EUA will remain  in effect (meaning this test can be used) for the duration of the COVID-19 declaration under Section 564(b)(1) of the Act, 21 U.S.C.section 360bbb-3(b)(1), unless the authorization is terminated  or revoked sooner.       Influenza A by PCR NEGATIVE NEGATIVE Final   Influenza B by PCR NEGATIVE NEGATIVE Final    Comment: (NOTE) The Xpert Xpress SARS-CoV-2/FLU/RSV plus assay is intended as an aid in the diagnosis of influenza from Nasopharyngeal swab specimens and should not be used as a sole basis for treatment. Nasal washings and aspirates are unacceptable for Xpert Xpress SARS-CoV-2/FLU/RSV testing.  Fact Sheet for Patients: EntrepreneurPulse.com.au  Fact Sheet for Healthcare Providers: IncredibleEmployment.be  This test is not yet approved or cleared by the [place] FDA and has been authorized for detection and/or diagnosis of SARS-CoV-2 by FDA under  an Emergency Use Authorization (EUA). This EUA will remain in effect  (meaning this test can be used) for the duration of the COVID-19 declaration under Section 564(b)(1) of the Act, 21 U.S.C. section 360bbb-3(b)(1), unless the authorization is terminated or revoked.  Performed at [facility], [address] [place], [place] [address]   MRSA Next Gen by PCR, Nasal     Status: None   Collection Time: 04/04/21  5:56 AM   Specimen: Nasal Mucosa; Nasal Swab  Result Value Ref Range Status   MRSA by PCR Next Gen NOT DETECTED NOT DETECTED Final    Comment: (NOTE) The GeneXpert MRSA Assay (FDA approved for NASAL specimens only), is one component of a comprehensive MRSA colonization surveillance program. It is not intended to diagnose MRSA infection nor to guide or monitor treatment for MRSA infections. Test performance is not FDA approved in patients less than 44 years old. Performed at [facility], [address] [place], [place] [address]     Procedures and diagnostic studies:  No results found.             LOS: 10 days   Mccayla Shimada  [facility]   Pager on [email or web address]. If 7PM-7AM, please contact night-coverage at [email or web address]     04/11/2021, 1:00 PM

## 2021-04-11 NOTE — TOC Progression Note (Signed)
Transition of Care [facility]) - Progression Note    Patient Details  Name: Gabriela [name] MRN: [name] Date of Birth: 05-22-1932  Transition of Care [facility]) CM/SW [place], LCSW Phone Number: 04/11/2021, 8:40 AM  Clinical Narrative:   CSW reached out to [place] with [facility] to see if they have accepted patient. Waiting for response.     Expected Discharge Plan: [place] Barriers to Discharge: Continued Medical Work up  Expected Discharge Plan and Services Expected Discharge Plan: [place] Choice: [place] arrangements for the past 2 months: Single Family Home                                       Social Determinants of Health (SDOH) Interventions    Readmission Risk Interventions No flowsheet data found.

## 2021-04-12 ENCOUNTER — Encounter: Payer: Self-pay | Admitting: Surgery

## 2021-04-12 DIAGNOSIS — K56609 Unspecified intestinal obstruction, unspecified as to partial versus complete obstruction: Secondary | ICD-10-CM | POA: Diagnosis not present

## 2021-04-12 LAB — CBC WITH DIFFERENTIAL/PLATELET
Abs Immature Granulocytes: 0.59 10*3/uL — ABNORMAL HIGH (ref 0.00–0.07)
Basophils Absolute: 0.1 10*3/uL (ref 0.0–0.1)
Basophils Relative: 0 %
Eosinophils Absolute: 0.1 10*3/uL (ref 0.0–0.5)
Eosinophils Relative: 1 %
HCT: 26.7 % — ABNORMAL LOW (ref 36.0–46.0)
Hemoglobin: 8.4 g/dL — ABNORMAL LOW (ref 12.0–15.0)
Immature Granulocytes: 2 %
Lymphocytes Relative: 3 %
Lymphs Abs: 0.8 10*3/uL (ref 0.7–4.0)
MCH: 25.8 pg — ABNORMAL LOW (ref 26.0–34.0)
MCHC: 31.5 g/dL (ref 30.0–36.0)
MCV: 81.9 fL (ref 80.0–100.0)
Monocytes Absolute: 1.7 10*3/uL — ABNORMAL HIGH (ref 0.1–1.0)
Monocytes Relative: 7 %
Neutro Abs: 21 10*3/uL — ABNORMAL HIGH (ref 1.7–7.7)
Neutrophils Relative %: 87 %
Platelets: 355 10*3/uL (ref 150–400)
RBC: 3.26 MIL/uL — ABNORMAL LOW (ref 3.87–5.11)
RDW: 14.8 % (ref 11.5–15.5)
WBC: 24.3 10*3/uL — ABNORMAL HIGH (ref 4.0–10.5)
nRBC: 0 % (ref 0.0–0.2)

## 2021-04-12 LAB — BASIC METABOLIC PANEL
Anion gap: 9 (ref 5–15)
BUN: 16 mg/dL (ref 8–23)
CO2: 26 mmol/L (ref 22–32)
Calcium: 8.4 mg/dL — ABNORMAL LOW (ref 8.9–10.3)
Chloride: 101 mmol/L (ref 98–111)
Creatinine, Ser: 0.61 mg/dL (ref 0.44–1.00)
GFR, Estimated: >60 mL/min (ref >60)
Glucose, Bld: 114 mg/dL — ABNORMAL HIGH (ref 70–99)
Potassium: 4.1 mmol/L (ref 3.5–5.1)
Sodium: 136 mmol/L (ref 135–145)

## 2021-04-12 LAB — MAGNESIUM: Magnesium: 2.2 mg/dL (ref 1.7–2.4)

## 2021-04-12 LAB — PROTIME-INR
INR: 3.3 — ABNORMAL HIGH (ref 0.8–1.2)
Prothrombin Time: 33.2 seconds — ABNORMAL HIGH (ref 11.4–15.2)

## 2021-04-12 LAB — GLUCOSE, CAPILLARY: Glucose-Capillary: 120 mg/dL — ABNORMAL HIGH (ref 70–99)

## 2021-04-12 MED ORDER — WARFARIN SODIUM 1 MG PO TABS
1.0000 mg | ORAL_TABLET | Freq: Once | ORAL | Status: AC
  Administered 2021-04-12: 1 mg via ORAL
  Filled 2021-04-12: qty 1

## 2021-04-12 NOTE — Progress Notes (Signed)
PHARMACY CONSULT NOTE   Pharmacy Consult for Electrolyte Monitoring and Replacement   Recent Labs: Potassium (mmol/L)  Date Value  04/12/2021 4.1  11/16/2012 3.3 (L)   Magnesium (mg/dL)  Date Value  04/12/2021 2.2   Calcium (mg/dL)  Date Value  04/12/2021 8.4 (L)   Calcium, Total (mg/dL)  Date Value  11/16/2012 8.8   Albumin (g/dL)  Date Value  04/01/2021 3.5  08/17/2011 3.7   Phosphorus (mg/dL)  Date Value  04/11/2021 3.6   Sodium (mmol/L)  Date Value  04/12/2021 136  11/16/2012 139   Corr Ca: 8.8 mg/dL  Assessment: 89YOF with PMH of HTN, stroke, TIA, GERD, hypothyroidism, depression with anxiety, hard of hearing, hiatal hernia, atrial fibrillation on warfarin, breast cancer, bladder cancer, anemia who presented with pain and constipation. 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 Pharmacy has been consulted for electrolyte replacement.   LR @ 34m/hr - stopped.   Goal of Therapy:  K 4 All other electrolytes WNL  Plan:  --No replacement needed at this time.  --Recheck electrolytes with AM labs   KOswald Hillock PharmD Clinical Pharmacist 04/12/2021 10:10 AM

## 2021-04-12 NOTE — Plan of Care (Signed)
  Problem: Clinical Measurements: Goal: Ability to maintain clinical measurements within normal limits will improve Outcome: Progressing Goal: Will remain free from infection Outcome: Progressing Goal: Diagnostic test results will improve Outcome: Progressing Goal: Respiratory complications will improve Outcome: Progressing Goal: Cardiovascular complication will be avoided Outcome: Progressing   Problem: Pain Managment: Goal: General experience of comfort will improve Outcome: Progressing   Pt is involved in and agrees with the plan of care. V/S stable. Complained of surgical pain on her abdomen; Dilaudid IV given with relief. 254ms taken of from her ileostomy.

## 2021-04-12 NOTE — Progress Notes (Signed)
Progress Note    Gabriela [name]  [id number] DOB: Apr 20, 1932  DOA: 04/01/2021 PCP: [name], MD      Brief Narrative:    Medical records reviewed and are as summarized below:  Gabriela [name] is a 87 y.o. female with medical history significant for hypertension, TIA, stroke, CAD, hypothyroidism, depression, anxiety, hearing impairment, hiatal hernia, atrial fibrillation on Coumadin, breast cancer, bladder cancer, chronic anemia.  She presented to the hospital with abdominal pain and constipation.  She she was found to have large bowel obstruction secondary to colonic mass concerning for malignancy.  She was treated with IV fluids and analgesics.  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       Assessment/Plan:   Principal Problem:   Large bowel obstruction (HCC) Active Problems:   Abdominal pain   Hypertension   Hypothyroidism   Stroke ([place])   Hypokalemia   Depression with anxiety   Atrial fibrillation, chronic (HCC)   Normocytic anemia   Nutrition Problem: Inadequate oral intake Etiology: acute illness  Signs/Symptoms: other (comment) (pt on clear liquid diet)   Body mass index is 28.31 kg/m.   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    Extensive soft tissue emphysema and pneumoperitoneum in the setting of recent laparoscopic partial colectomy She has been started on a regular diet and she is tolerating this thus far.  Persistent leukocytosis: Etiology unclear.  Continue to monitor CBC.  Atrial fibrillation and history of stroke: INR is 3.3.  Pharmacy is managing warfarin and INR per protocol.  CHA2DS2-VASc score is 6.  Hypokalemia and hyponatremia: Improved  Other comorbidities include depression, anxiety, chronic anemia, hypothyroidism, hypertension     Diet Order             Diet regular Room  service appropriate? Yes; Fluid consistency: Thin  Diet effective now                      Consultants: General surgeon  Procedures: Robotic assisted subtotal colectomy with end ileostomy on 04/06/2021    Medications:    amLODipine  5 mg Oral Daily   Chlorhexidine Gluconate Cloth  6 each Topical Daily   feeding supplement  237 mL Oral TID BM   irbesartan  300 mg Oral Daily   levothyroxine  112 mcg Oral QAC breakfast   multivitamin with minerals  1 tablet Oral Daily   pantoprazole  40 mg Oral Daily   sertraline  50 mg Oral QHS   warfarin  1 mg Oral ONCE-1600   Warfarin - Pharmacist Dosing Inpatient   Does not apply q1600   Continuous Infusions:     Anti-infectives (From admission, onward)    Start     Dose/Rate Route Frequency Ordered Stop   04/06/21 1320  sodium chloride 0.9 % with cefoTEtan (CEFOTAN) ADS Med       Note to Pharmacy: Maryagnes Amos   : cabinet override      04/06/21 1320 04/06/21 1457   04/06/21 0830  cefoTEtan (CEFOTAN) 2 g in sodium chloride 0.9 % 100 mL IVPB        2 g 200 mL/hr over 30 Minutes Intravenous On call to O.R. 04/06/21 0740 04/06/21 1452              Family Communication/Anticipated D/C date and plan/Code Status   DVT prophylaxis: SCDs Start: 04/01/21  1207 warfarin (COUMADIN) tablet 1 mg     Code Status: DNR  Family Communication: None Disposition Plan:    Status is: Inpatient  Remains inpatient appropriate because:IV treatments appropriate due to intensity of illness or inability to take PO and Inpatient level of care appropriate due to severity of illness  Dispo: The patient is from: Home              Anticipated d/c is to: SNF              Patient currently is not medically stable to d/c.   Difficult to place patient No           Subjective:   Interval events noted.  She was sitting up eating breakfast.  She feels better today.  Her abdominal pain is better.  Objective:    Vitals:    04/12/21 0432 04/12/21 0440 04/12/21 0757 04/12/21 1028  BP: (!) 146/63  (!) 141/83 (!) 147/76  Pulse: 93  (!) 103 93  Resp: 18  18   Temp: 98.2 F (36.8 C)  98.7 F (37.1 C)   TempSrc: Oral  Oral   SpO2: 91%  93% 96%  Weight:  72.5 kg    Height:       No data found.   Intake/Output Summary (Last 24 hours) at 04/12/2021 1401 Last data filed at 04/12/2021 1350 Gross per 24 hour  Intake 2934.81 ml  Output 650 ml  Net 2284.81 ml   Filed Weights   04/09/21 0500 04/11/21 0500 04/12/21 0440  Weight: 72.1 kg 71.7 kg 72.5 kg    Exam:  GEN: NAD, sitting up in the chair SKIN: Warm and dry EYES: No pallor or icterus ENT: MMM CV: RRR PULM: CTA B ABD: soft, ND, mild mid abdominal tenderness to deep palpation, no rebound tenderness or guarding, +BS, + ileostomy with dark green fluid CNS: AAO x 3, non focal EXT: No edema or tenderness          Data Reviewed:   I have personally reviewed following labs and imaging studies:  Labs: Labs show the following:   Basic Metabolic Panel: Recent Labs  Lab 04/08/21 0325 04/09/21 0458 04/10/21 0325 04/11/21 0435 04/12/21 0434  NA 138 135 133* 135 136  K 3.3* 4.3 3.8 3.9 4.1  CL 103 102 99 103 101  CO2 '27 25 26 26 26  '$ GLUCOSE 90 135* 111* 124* 114*  BUN 14 27* 25* 18 16  CREATININE 0.68 0.76 0.66 0.61 0.61  CALCIUM 8.2* 8.4* 8.0* 8.1* 8.4*  MG 2.0 2.2 2.3 2.2 2.2  PHOS  --  2.3* 4.1 3.6  --    GFR Estimated Creatinine Clearance: 47.2 mL/min (by C-G formula based on SCr of 0.61 mg/dL). Liver Function Tests: No results for input(s): AST, ALT, ALKPHOS, BILITOT, PROT, ALBUMIN in the last 168 hours. No results for input(s): LIPASE, AMYLASE in the last 168 hours. No results for input(s): AMMONIA in the last 168 hours. Coagulation profile Recent Labs  Lab 04/08/21 1155 04/09/21 0458 04/10/21 0325 04/11/21 0435 04/12/21 0434  INR 1.4* 1.6* 2.4* 2.9* 3.3*    CBC: Recent Labs  Lab 04/08/21 0325 04/09/21 0458  04/10/21 0325 04/11/21 0435 04/12/21 0434  WBC 23.0* 27.6* 23.9* 23.1* 24.3*  NEUTROABS  --   --   --  20.3* 21.0*  HGB 9.1* 8.9* 8.3* 8.3* 8.4*  HCT 29.4* 28.6* 26.8* 26.1* 26.7*  MCV 82.8 82.7 82.7 83.1 81.9  PLT 294 310 303 314  355   Cardiac Enzymes: No results for input(s): CKTOTAL, CKMB, CKMBINDEX, TROPONINI in the last 168 hours. BNP (last 3 results) No results for input(s): PROBNP in the last 8760 hours. CBG: Recent Labs  Lab 04/07/21 0737 04/08/21 0808 04/09/21 0735 04/11/21 0757 04/12/21 0758  GLUCAP 102* 88 134* 125* 120*   D-Dimer: No results for input(s): DDIMER in the last 72 hours. Hgb A1c: No results for input(s): HGBA1C in the last 72 hours. Lipid Profile: No results for input(s): CHOL, HDL, LDLCALC, TRIG, CHOLHDL, LDLDIRECT in the last 72 hours. Thyroid function studies: No results for input(s): TSH, T4TOTAL, T3FREE, THYROIDAB in the last 72 hours.  Invalid input(s): FREET3 Anemia work up: No results for input(s): VITAMINB12, FOLATE, FERRITIN, TIBC, IRON, RETICCTPCT in the last 72 hours. Sepsis Labs: Recent Labs  Lab 04/09/21 0458 04/09/21 0902 04/10/21 0325 04/11/21 0435 04/12/21 0434  WBC 27.6*  --  23.9* 23.1* 24.3*  LATICACIDVEN  --  1.3  --   --   --     Microbiology Recent Results (from the past 240 hour(s))  MRSA Next Gen by PCR, Nasal     Status: None   Collection Time: 04/04/21  5:56 AM   Specimen: Nasal Mucosa; Nasal Swab  Result Value Ref Range Status   MRSA by PCR Next Gen NOT DETECTED NOT DETECTED Final    Comment: (NOTE) The GeneXpert MRSA Assay (FDA approved for NASAL specimens only), is one component of a comprehensive MRSA colonization surveillance program. It is not intended to diagnose MRSA infection nor to guide or monitor treatment for MRSA infections. Test performance is not FDA approved in patients less than 77 years old. Performed at [facility], [address] [place], [place] [address]      Procedures and diagnostic studies:  No results found.             LOS: 11 days   [place] Copywriter, advertising on [email or web address]. If 7PM-7AM, please contact night-coverage at [email or web address]     04/12/2021, 2:01 PM

## 2021-04-12 NOTE — TOC Progression Note (Signed)
Transition of Care [facility]) - Progression Note    Patient Details  Name: Gabriela [name] MRN: [place]:[id number] Date of Birth: 07-08-1932  Transition of [facility]) CM/SW [place], LCSW Phone Number: 04/12/2021, 7:55 AM  Clinical Narrative:   Olivia Mackie with [facility] stated that they wanted to see how patient does over the weekend before making a bed offer.    Expected Discharge Plan: [place] Barriers to Discharge: Continued Medical Work up  Expected Discharge Plan and Services Expected Discharge Plan: [place] Choice: [place] arrangements for the past 2 months: Single Family Home                                       Social Determinants of Health (SDOH) Interventions    Readmission Risk Interventions No flowsheet data found.

## 2021-04-12 NOTE — Progress Notes (Signed)
[place] Hospital Day(s): 11.   Post op day(s): 6 Days Post-Op.   Interval History:  Patient seen and examined No acute events or new complaints overnight.  No nausea, emesis. Leukocytosis is about the same 24K; no fevers Hgb remains stable  Renal function remains normal; sCr -essentially unchanged; UO - unmeasured No electrolyte derangements She continues to have anorexia, tolerating limited regular diet when she may have diet without restriction. She has had return of ileostomy function; 400 ccs, very clear bile colored fluid.  Vital signs in last 24 hours: [min-max] current  Temp:  [98.2 F (36.8 C)-98.9 F (37.2 C)] 98.2 F (36.8 C) (09/18 0432) Pulse Rate:  [88-104] 93 (09/18 0432) Resp:  [18-20] 18 (09/18 0432) BP: (133-146)/(60-63) 146/63 (09/18 0432) SpO2:  [91 %-94 %] 91 % (09/18 0432) Weight:  [72.5 kg] 72.5 kg (09/18 0440)     Height: '5\' 3"'$  (160 cm) Weight: 72.5 kg BMI (Calculated): 28.32   Intake/Output last 2 shifts:  09/17 0701 - 09/18 0700 In: 2934.8 [P.O.:420; I.V.:2514.8] Out: 700 [Urine:300; Stool:400]   Physical Exam:  Constitutional: alert, cooperative and no distress  Respiratory: breathing non-labored at rest  Cardiovascular: regular rate and sinus rhythm  Gastrointestinal: Soft, she is not peritonitic.  Ileostomy in the right abdomen, edematous, no gas,  liquid bile colored fluid in bag, red Robinson catheter seems quite mobile, present to assist with stenting.  Abdomen still larger than her baseline, possibly exacerbated by degree of residual ascites/intraperitoneal fluid. Integumentary: Laparoscopic incisions are CDI with dermabond, no erythema or drainage   Labs:  CBC Latest Ref Rng & Units 04/12/2021 04/11/2021 04/10/2021  WBC 4.0 - 10.5 K/uL 24.3(H) 23.1(H) 23.9(H)  Hemoglobin 12.0 - 15.0 g/dL 8.4(L) 8.3(L) 8.3(L)  Hematocrit 36.0 - 46.0 % 26.7(L) 26.1(L) 26.8(L)  Platelets 150 - 400 K/uL 355 314 303    CMP Latest Ref Rng & Units 04/12/2021 04/11/2021 04/10/2021  Glucose 70 - 99 mg/dL 114(H) 124(H) 111(H)  BUN 8 - 23 mg/dL 16 18 25(H)  Creatinine 0.44 - 1.00 mg/dL 0.61 0.61 0.66  Sodium 135 - 145 mmol/L 136 135 133(L)  Potassium 3.5 - 5.1 mmol/L 4.1 3.9 3.8  Chloride 98 - 111 mmol/L 101 103 99  CO2 22 - 32 mmol/L '26 26 26  '$ Calcium 8.9 - 10.3 mg/dL 8.4(L) 8.1(L) 8.0(L)  Total Protein 6.5 - 8.1 g/dL - - -  Total Bilirubin 0.3 - 1.2 mg/dL - - -  Alkaline Phos 38 - 126 U/L - - -  AST 15 - 41 U/L - - -  ALT 0 - 44 U/L - - -     Imaging studies:  Most recent:  CT Abdomen/Pelvis (04/09/2021) personally reviewed and radiologist report reviewed below:  IMPRESSION: Severe subcutaneous emphysema throughout the chest and abdominal wall soft tissues, much greater than typically seen postoperatively. Gas-forming infection/necrotizing fasciitis cannot be excluded; clinical correlation is recommended.   Status post subtotal colectomy and right abdominal ileostomy, with small amount of free air, which is likely postoperative in nature.   Diffuse moderate to severe small bowel dilatation to the site of the right abdominal ileostomy, likely due to severe ileus although obstruction at the ostomy site cannot be excluded. No evidence of pneumatosis.   Increased haustral fold thickening involving the residual sigmoid colon, which shows severe diverticulosis. This appearance is not typical for diverticulitis, and differential diagnosis includes infectious and ischemic colitis.   Mild ascites. No evidence of abscess.   3.3  cm lobulated soft tissue mass in medial left breast. Malignancy cannot be excluded. Recommend dedicated breast imaging evaluation for further characterization.   Assessment/Plan:  87 y.o. female now with ileostomy output 6 Days Post-Op s/p robotic assisted laparoscopic subtotal colectomy with end ileostomy for obstructing transverse colon mass.   -We half liberalized  diet, I believe having options might encourage her appetite; I hope we can cut encourage p.o. intake, and stimulate her appetite perhaps with more activity.  - Monitor leukocytosis; uncertain etiology at this point.   - Monitor abdominal examination; on-going ileostomy function             - Early mobilization encouraged, she has politely deferred PT's assistance and needs to cooperate more with her mobilization; PT following              - Further management per primary service; we will follow   All of the above findings and recommendations were discussed with the patient, and the medical team, and all of patient's questions were answered to her expressed satisfaction.  -- Ronny Bacon, M.D., [facility]  04/12/2021 ; 7:06 AM

## 2021-04-12 NOTE — Progress Notes (Signed)
ANTICOAGULATION CONSULT NOTE - Follow Up Consult  Pharmacy Consult for warfarin Indication: atrial fibrillation  Allergies  Allergen Reactions   Celebrex [Celecoxib] Anaphylaxis   Morphine And Related Swelling    Lips and mouth   Povidone-Iodine Other (See Comments)    Other reaction(s): ITCHING Other reaction(s): ITCHING    Sulfa Antibiotics     Patient Measurements: Height: '5\' 3"'$  (160 cm) Weight: 72.5 kg (159 lb 13.3 oz) IBW/kg (Calculated) : 52.4 Heparin Dosing Weight: 65.9kg  Vital Signs: Temp: 98.7 F (37.1 C) (09/18 0757) Temp Source: Oral (09/18 0757) BP: 141/83 (09/18 0757) Pulse Rate: 103 (09/18 0757)  Labs: Recent Labs    04/09/21 1757 04/10/21 0325 04/10/21 0325 04/11/21 0435 04/12/21 0434  HGB  --  8.3*   < > 8.3* 8.4*  HCT  --  26.8*  --  26.1* 26.7*  PLT  --  303  --  314 355  LABPROT  --  25.8*  --  30.7* 33.2*  INR  --  2.4*  --  2.9* 3.3*  HEPARINUNFRC 0.24* 0.24*  --   --   --   CREATININE  --  0.66  --  0.61 0.61   < > = values in this interval not displayed.     Estimated Creatinine Clearance: 47.2 mL/min (by C-G formula based on SCr of 0.61 mg/dL).   Medications:  Scheduled:   amLODipine  5 mg Oral Daily   Chlorhexidine Gluconate Cloth  6 each Topical Daily   feeding supplement  237 mL Oral TID BM   irbesartan  300 mg Oral Daily   levothyroxine  112 mcg Oral QAC breakfast   multivitamin with minerals  1 tablet Oral Daily   pantoprazole  40 mg Oral Daily   sertraline  50 mg Oral QHS   Warfarin - Pharmacist Dosing Inpatient   Does not apply q25    Assessment: 87 y.o. female with medical history significant of hypertension, stroke, TIA, GERD, hypothyroidism, depression with anxiety, hard of hearing, hiatal hernia, atrial fibrillation on Coumadin, breast cancer, bladder cancer, anemia, who presents with abdominal pain and constipation. Pharmacy has been consulted for heparin bridge and warfarin. Baseline CBC was within normal  limits.    PTA warfarin regimen: 3 mg on Sat/Sun/Mon/Tue; 4 mg on Wed/Thur/Fri - last dose was 3 mg on 9/6  Goal of Therapy:  INR 2-3 Monitor platelets by anticoagulation protocol: Yes  Warfarin: Date    INR      Comment  9/14     1.4       Subtherapeutic - gave 5 mg 9/15     1.6       Subtherapeutic - gave 5 mg 9/16  2.4  Therapeutic - gave 2 mg 9/17   2.9  Therapeutic - HOLD  9/18  3.3  supraTherapeutic - 1 mg      Plan:  INR is supratherapeutic. Will give warfarin 1 mg (decrease from home dose). Predict the INR will trend down since the dose was held yesterday. Will give lower dose today to ensure INR does not go subtherapeutic. Daily INR ordered. CBC at least 3 days.  INR is therapeutic, but in the upper limit of normal. Will hold warfarin tonight. Daily INR ordered. CBC at least every 3 days.    Oswald Hillock, PharmD Clinical Pharmacist   04/12/2021 10:05 AM

## 2021-04-13 ENCOUNTER — Inpatient Hospital Stay: Payer: Medicare PPO

## 2021-04-13 ENCOUNTER — Encounter: Payer: Self-pay | Admitting: Surgery

## 2021-04-13 DIAGNOSIS — K56609 Unspecified intestinal obstruction, unspecified as to partial versus complete obstruction: Secondary | ICD-10-CM | POA: Diagnosis not present

## 2021-04-13 LAB — CBC WITH DIFFERENTIAL/PLATELET
Abs Immature Granulocytes: 1.24 10*3/uL — ABNORMAL HIGH (ref 0.00–0.07)
Basophils Absolute: 0.1 10*3/uL (ref 0.0–0.1)
Basophils Relative: 0 %
Eosinophils Absolute: 0 10*3/uL (ref 0.0–0.5)
Eosinophils Relative: 0 %
HCT: 28.5 % — ABNORMAL LOW (ref 36.0–46.0)
Hemoglobin: 9 g/dL — ABNORMAL LOW (ref 12.0–15.0)
Immature Granulocytes: 5 %
Lymphocytes Relative: 3 %
Lymphs Abs: 0.7 10*3/uL (ref 0.7–4.0)
MCH: 25.9 pg — ABNORMAL LOW (ref 26.0–34.0)
MCHC: 31.6 g/dL (ref 30.0–36.0)
MCV: 81.9 fL (ref 80.0–100.0)
Monocytes Absolute: 1.4 10*3/uL — ABNORMAL HIGH (ref 0.1–1.0)
Monocytes Relative: 5 %
Neutro Abs: 21.9 10*3/uL — ABNORMAL HIGH (ref 1.7–7.7)
Neutrophils Relative %: 87 %
Platelets: 458 10*3/uL — ABNORMAL HIGH (ref 150–400)
RBC: 3.48 MIL/uL — ABNORMAL LOW (ref 3.87–5.11)
RDW: 14.8 % (ref 11.5–15.5)
Smear Review: UNDETERMINED
WBC: 25.3 10*3/uL — ABNORMAL HIGH (ref 4.0–10.5)
nRBC: 0 % (ref 0.0–0.2)

## 2021-04-13 LAB — PROTIME-INR
INR: 3.6 — ABNORMAL HIGH (ref 0.8–1.2)
Prothrombin Time: 36 seconds — ABNORMAL HIGH (ref 11.4–15.2)

## 2021-04-13 LAB — GLUCOSE, CAPILLARY: Glucose-Capillary: 137 mg/dL — ABNORMAL HIGH (ref 70–99)

## 2021-04-13 MED ORDER — DEXTROSE IN LACTATED RINGERS 5 % IV SOLN: INTRAVENOUS | Status: AC

## 2021-04-13 MED ORDER — PANTOPRAZOLE SODIUM 40 MG IV SOLR
40.0000 mg | Freq: Every day | INTRAVENOUS | Status: DC
  Administered 2021-04-13 – 2021-04-22 (×10): 40 mg via INTRAVENOUS
  Filled 2021-04-13 (×10): qty 40

## 2021-04-13 MED ORDER — SODIUM CHLORIDE 0.9 % IV SOLN
6.2500 mg | Freq: Four times a day (QID) | INTRAVENOUS | Status: AC | PRN
  Administered 2021-04-13: 6.25 mg via INTRAVENOUS
  Filled 2021-04-13 (×2): qty 0.25

## 2021-04-13 MED ORDER — SODIUM CHLORIDE 0.9 % IV SOLN
INTRAVENOUS | Status: DC | PRN
  Administered 2021-04-13: 250 mL via INTRAVENOUS
  Administered 2021-04-17: 1000 mL via INTRAVENOUS

## 2021-04-13 MED ORDER — PROMETHAZINE HCL 25 MG/ML IJ SOLN
6.2500 mg | Freq: Four times a day (QID) | INTRAMUSCULAR | Status: AC | PRN
Start: 2021-04-13 — End: 2021-04-13
  Administered 2021-04-13: 6.25 mg via INTRAVENOUS
  Filled 2021-04-13 (×3): qty 0.25

## 2021-04-13 NOTE — Progress Notes (Signed)
Pt had nausea with vomiting last night, zofran and phenergan given, little distended and placed on oxygen 95 % on 2L.

## 2021-04-13 NOTE — Progress Notes (Signed)
Physical Therapy Treatment Patient Details Name: Gabriela [name] MRN: [id number] DOB: 1931-08-08 Today's Date: 04/13/2021   History of Present Illness Pt. is a 87 y.o. female with medical history significant of hypertension, stroke, TIA, GERD, hypothyroidism, depression with anxiety, hard of hearing, hiatal hernia, atrial fibrillation on Coumadin, breast cancer, bladder cancer, anemia, who presents with abdominal pain and constipation. 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    PT Comments    Pt received seated in recliner chair with request to transfer back to bed. Pt continues to have episodes of nausea and vomiting into emesis bag. Pt completed transfer with minA and verbal cues for usage of B UEs to assist throughout. Pt completed seated and supine exercises to tolerance. Pt is limited based on decreased activity tolerance, impaired balance, and increased abdominal discomfort with nausea and vomiting. Pt will continue to benefit from skilled PT services to improve overall functional mobility.      Recommendations for follow up therapy are one component of a multi-disciplinary discharge planning process, led by the attending physician.  Recommendations may be updated based on patient status, additional functional criteria and insurance authorization.  Follow Up Recommendations  SNF     Equipment Recommendations  None recommended by PT    Recommendations for Other Services       Precautions / Restrictions Precautions Precautions: Fall Restrictions Weight Bearing Restrictions: No     Mobility  Bed Mobility Overal bed mobility: Needs Assistance Bed Mobility: Supine to Sit     Supine to sit: Min assist     General bed mobility comments: MIN A to scoot hips towards EOB    Transfers Overall transfer level: Needs assistance Equipment used: Rolling walker (2 wheeled) Transfers: Sit to/from  [facility] Sit to Stand: Min assist Stand pivot transfers: Min assist       General transfer comment: MIN A for stand pivot transfer bed>recliner with RW  Ambulation/Gait Ambulation/Gait assistance: Min assist Gait Distance (Feet): 3 Feet Assistive device: Rolling walker (2 wheeled) Gait Pattern/deviations: Step-to pattern     General Gait Details: MinA for balance and verbal cues for movement of RW prior to sitting back down at EOB.   Stairs             Wheelchair Mobility    Modified Rankin (Stroke Patients Only)       Balance Overall balance assessment: Needs assistance Sitting-balance support: Single extremity supported;Feet supported Sitting balance-Leahy Scale: Fair Sitting balance - Comments: Cues for improved upright posture in static sitting. Pt reaching out to hold onto bed rail to maintain seated posture. Pt able to maintain sitting balance with 1 UE assist with cues. Pt seated in posterior pelvic tilt   Standing balance support: Bilateral upper extremity supported Standing balance-Leahy Scale: Poor Standing balance comment: MIN A for balance during stand pivot transfer with RW                            Cognition Arousal/Alertness: Awake/alert Behavior During Therapy: WFL for tasks assessed/performed Overall Cognitive Status: Within Functional Limits for tasks assessed                                        Exercises Other Exercises Other Exercises: Seated EOB AROM: LAQ, Marching, Heel/Toe raises 2 x 20 each.  Supine AROM: Hip ABD/ADD, Heel slides, SAQ 2 x 15 each. Supine bridging x 5.    General Comments General comments (skin integrity, edema, etc.): Pt with emesis throughout session, however agreeable to sit in recliner to faciliate upright posture and prevent aspiration      Pertinent Vitals/Pain Pain Assessment: No/denies pain    Home Living                      Prior Function             PT Goals (current goals can now be found in the care plan section) Acute Rehab PT Goals Patient Stated Goal: to return home PT Goal Formulation: With patient Time For Goal Achievement: 04/22/21 Potential to Achieve Goals: Good Progress towards PT goals: Progressing toward goals    Frequency    Min 2X/week      PT Plan Current plan remains appropriate    Co-evaluation              AM-PAC PT "6 Clicks" Mobility   Outcome Measure  Help needed turning from your back to your side while in a flat bed without using bedrails?: A Lot Help needed moving from lying on your back to sitting on the side of a flat bed without using bedrails?: A Lot Help needed moving to and from a bed to a chair (including a wheelchair)?: A Little Help needed standing up from a chair using your arms (e.g., wheelchair or bedside chair)?: A Little Help needed to walk in hospital room?: A Lot Help needed climbing 3-5 steps with a railing? : Total 6 Click Score: 13    End of Session Equipment Utilized During Treatment: Gait belt;Oxygen Activity Tolerance: Patient tolerated treatment well;Patient limited by fatigue Patient left: in bed;with call bell/phone within reach;with bed alarm set Nurse Communication: Mobility status PT Visit Diagnosis: Unsteadiness on feet (R26.81);Repeated falls (R29.6);Muscle weakness (generalized) (M62.81)     Time: 5638-9373 PT Time Calculation (min) (ACUTE ONLY): 18 min  Charges:  $Therapeutic Exercise: 8-22 mins                    Andrey Campanile, SPT  Andrey Campanile 04/13/2021, 4:42 PM

## 2021-04-13 NOTE — Progress Notes (Signed)
PHARMACY CONSULT NOTE   Pharmacy Consult for Electrolyte Monitoring and Replacement   Recent Labs: Potassium (mmol/L)  Date Value  04/12/2021 4.1  11/16/2012 3.3 (L)   Magnesium (mg/dL)  Date Value  04/12/2021 2.2   Calcium (mg/dL)  Date Value  04/12/2021 8.4 (L)   Calcium, Total (mg/dL)  Date Value  11/16/2012 8.8   Albumin (g/dL)  Date Value  04/01/2021 3.5  08/17/2011 3.7   Phosphorus (mg/dL)  Date Value  04/11/2021 3.6   Sodium (mmol/L)  Date Value  04/12/2021 136  11/16/2012 139   Corr Ca: 8.8 mg/dL  Assessment: 89YOF with PMH of HTN, stroke, TIA, GERD, hypothyroidism, depression with anxiety, hard of hearing, hiatal hernia, atrial fibrillation on warfarin, breast cancer, bladder cancer, anemia who presented with pain and constipation. 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 Pharmacy has been consulted for electrolyte replacement.    Goal of Therapy:  K 4 All other electrolytes WNL  Plan:  --Electrolytes have stabilized --Labs per team  Tawnya Crook, PharmD, BCPS Clinical Pharmacist 04/13/2021 11:51 AM

## 2021-04-13 NOTE — Plan of Care (Signed)
  Problem: Elimination: Goal: Will not experience complications related to urinary retention Outcome: Progressing   Problem: Nutrition: Goal: Adequate nutrition will be maintained Outcome: Not Progressing

## 2021-04-13 NOTE — Progress Notes (Signed)
Occupational Therapy Treatment Patient Details Name: Gabriela [name] MRN: [id number] DOB: 08/07/1931 Today's Date: 04/13/2021   History of present illness Pt. is a 87 y.o. female with medical history significant of hypertension, stroke, TIA, GERD, hypothyroidism, depression with anxiety, hard of hearing, hiatal hernia, atrial fibrillation on Coumadin, breast cancer, bladder cancer, anemia, who presents with abdominal pain and constipation. 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   OT comments  Pt seen for OT treatment on this date. Upon arrival to room, pt awake sitting upright in bed, reporting nausea and vomiting. Pt with emesis throughout session, however agreeable to sit in recliner to faciliate upright posture and prevent aspiration. Pt currently requires MIN A for bed mobility, MIN A for stand pivot transfer bed>recliner with RW, and MIN A for seated grooming tasks due to current functional impairments (see OT problem list below). Pt declining to participate in further ADLs d/t nausea. Pt left in recliner, with all needs within reach, and family at bedside. Pt is making good progress toward goals and continues to benefit from skilled OT services to maximize return to PLOF and minimize risk of future falls, injury, caregiver burden, and readmission. Will continue to follow POC. Discharge recommendation remains appropriate.     Recommendations for follow up therapy are one component of a multi-disciplinary discharge planning process, led by the attending physician.  Recommendations may be updated based on patient status, additional functional criteria and insurance authorization.    Follow Up Recommendations  SNF    Equipment Recommendations  3 in 1 bedside commode       Precautions / Restrictions Precautions Precautions: Fall Restrictions Weight Bearing Restrictions: No       Mobility Bed  Mobility Overal bed mobility: Needs Assistance Bed Mobility: Supine to Sit     Supine to sit: Min assist     General bed mobility comments: MIN A to scoot hips towards EOB    Transfers Overall transfer level: Needs assistance Equipment used: Rolling walker (2 wheeled) Transfers: Sit to/from [facility] Sit to Stand: Min assist Stand pivot transfers: Min assist       General transfer comment: MIN A for stand pivot transfer bed>recliner with RW    Balance Overall balance assessment: Needs assistance Sitting-balance support: Single extremity supported;Feet supported Sitting balance-Leahy Scale: Fair    Standing balance support: Bilateral upper extremity supported Standing balance-Leahy Scale: Poor Standing balance comment: MIN A for balance during stand pivot transfer with RW                           ADL either performed or assessed with clinical judgement   ADL Overall ADL's : Needs assistance/impaired     Grooming: Wash/dry face;Supervision/safety;Set up;Sitting                               Functional mobility during ADLs: Minimal assistance;Rolling walker (stand pivot transfer to recliner)       Vision Baseline Vision/History: 1 Wears glasses            Cognition Arousal/Alertness: Awake/alert Behavior During Therapy: WFL for tasks assessed/performed Overall Cognitive Status: Within Functional Limits for tasks assessed  General Comments Pt with emesis throughout session, however agreeable to sit in recliner to faciliate upright posture and prevent aspiration    Pertinent Vitals/ Pain       Pain Assessment: No/denies pain         Frequency  Min 2X/week        Progress Toward Goals  OT Goals(current goals can now be found in the care plan section)  Progress towards OT goals: Progressing toward goals  Acute Rehab OT Goals Patient Stated  Goal: to return home OT Goal Formulation: With patient Time For Goal Achievement: 04/22/21  Plan Discharge plan remains appropriate;Frequency remains appropriate       AM-PAC OT "6 Clicks" Daily Activity     Outcome Measure   Help from another person eating meals?: None Help from another person taking care of personal grooming?: None Help from another person toileting, which includes using toliet, bedpan, or urinal?: A Lot Help from another person bathing (including washing, rinsing, drying)?: A Lot Help from another person to put on and taking off regular upper body clothing?: A Little Help from another person to put on and taking off regular lower body clothing?: A Lot 6 Click Score: 17    End of Session Equipment Utilized During Treatment: Gait belt;Rolling walker  OT Visit Diagnosis: Unsteadiness on feet (R26.81);Muscle weakness (generalized) (M62.81)   Activity Tolerance Patient tolerated treatment well   Patient Left in chair;with call bell/phone within reach;with chair alarm set;with family/visitor present   Nurse Communication Mobility status;Other (comment) (pt nauseous & vomitting during session)        Time: 1126-1150 OT Time Calculation (min): 24 min  Charges: OT General Charges $OT Visit: 1 Visit OT Treatments $Self Care/Home Management : 23-37 mins  Fredirick Maudlin, OTR/L [place]

## 2021-04-13 NOTE — Progress Notes (Signed)
   04/13/21 1330  Clinical Encounter Type  Visited With Patient  Visit Type Initial;Spiritual support;Social support  Referral From Chaplain  Consult/Referral To [employer] visited PT while doing rounds. PT had a bag in hand, and stated she had been throwing up all day. The PT recently came out of surgery. PT spoke briefly of her upbringing in [place], and spoke of her love for her children (one lives in [place] and the other in [place]). Chaplain ministered with a peaceful presence, well wishes, and engaging/reflective listening.

## 2021-04-13 NOTE — Progress Notes (Signed)
[facility] for warfarin Indication: atrial fibrillation  Allergies  Allergen Reactions   Celebrex [Celecoxib] Anaphylaxis   Morphine And Related Swelling    Lips and mouth   Povidone-Iodine Other (See Comments)    Other reaction(s): ITCHING Other reaction(s): ITCHING    Sulfa Antibiotics     Patient Measurements: Height: '5\' 3"'$  (160 cm) Weight: 71.9 kg (158 lb 8.2 oz) IBW/kg (Calculated) : 52.4 Heparin Dosing Weight: 65.9kg  Vital Signs: Temp: 98.5 F (36.9 C) (09/19 0726) Temp Source: Oral (09/19 0726) BP: 136/64 (09/19 0726) Pulse Rate: 91 (09/19 0726)  Labs: Recent Labs    04/11/21 0435 04/12/21 0434 04/13/21 0432  HGB 8.3* 8.4* 9.0*  HCT 26.1* 26.7* 28.5*  PLT 314 355 458*  LABPROT 30.7* 33.2* 36.0*  INR 2.9* 3.3* 3.6*  CREATININE 0.61 0.61  --      Estimated Creatinine Clearance: 47.1 mL/min (by C-G formula based on SCr of 0.61 mg/dL).   Assessment: 87 y.o. female with medical history significant of hypertension, stroke, TIA, GERD, hypothyroidism, depression with anxiety, hard of hearing, hiatal hernia, atrial fibrillation on Coumadin, breast cancer, bladder cancer, anemia, who presents with abdominal pain and constipation. Pharmacy has been consulted for heparin bridge and warfarin. Baseline CBC was within normal limits.    PTA warfarin regimen: 3 mg on Sat/Sun/Mon/Tue; 4 mg on Wed/Thur/Fri - last dose was 3 mg on 9/6  Goal of Therapy:  INR 2-3 Monitor platelets by anticoagulation protocol: Yes  Warfarin: Date INR Warfarin Dose  9/14 1.4 5 mg 9/15 1.6 5 mg 9/16 2.4 2 mg 9/17 2.9 HOLD  9/18 3.3 1 mg  9/19 3.6 HOLD       Plan:  INR supratherapeutic, trended up following reduced dose. Will hold warfarin tonight. Follow up INR with morning labs.  Tawnya Crook, PharmD, BCPS Clinical Pharmacist 04/13/2021 11:55 AM

## 2021-04-13 NOTE — Progress Notes (Addendum)
Progress Note    Gabriela [name]  [id number] DOB: 1931-09-28  DOA: 04/01/2021 PCP: [name], MD      Brief Narrative:    Medical records reviewed and are as summarized below:  Gabriela [name] is a 87 y.o. female with medical history significant for hypertension, TIA, stroke, CAD, hypothyroidism, depression, anxiety, hearing impairment, hiatal hernia, atrial fibrillation on Coumadin, breast cancer, bladder cancer, chronic anemia.  She presented to the hospital with abdominal pain and constipation.  She she was found to have large bowel obstruction secondary to colonic mass concerning for malignancy.  She was treated with IV fluids and analgesics.  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       Assessment/Plan:   Principal Problem:   Large bowel obstruction (HCC) Active Problems:   Abdominal pain   Hypertension   Hypothyroidism   Stroke ([place])   Hypokalemia   Depression with anxiety   Atrial fibrillation, chronic (HCC)   Normocytic anemia   Nutrition Problem: Inadequate oral intake Etiology: acute illness  Signs/Symptoms: other (comment) (pt on clear liquid diet)   Body mass index is 28.08 kg/m.   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   She has been made n.p.o. again because of abdominal distention and recent vomiting.  Restart IV fluids.  Abdominal x-ray is pending. Extensive soft tissue emphysema and pneumoperitoneum in the setting of recent laparoscopic partial colectomy  Persistent leukocytosis: Etiology unclear.  Continue to monitor CBC.  Acute hypoxemic respiratory failure: Oxygen saturation dropped to 87% on room air.  Continue 2 L/min oxygen via nasal cannula.  Chest x-ray is pending.  Atrial fibrillation and history of stroke: INR is 3.6.  Pharmacy is managing warfarin and INR per protocol.   CHA2DS2-VASc score is 6.  Hypokalemia and hyponatremia: Improved  Other comorbidities include depression, anxiety, chronic anemia, hypothyroidism, hypertension     Diet Order             Diet NPO time specified Except for: Ice Chips, Sips with Meds, Other (See Comments)  Diet effective now                      Consultants: General surgeon  Procedures: Robotic assisted subtotal colectomy with end ileostomy on 04/06/2021    Medications:    amLODipine  5 mg Oral Daily   feeding supplement  237 mL Oral TID BM   irbesartan  300 mg Oral Daily   levothyroxine  112 mcg Oral QAC breakfast   multivitamin with minerals  1 tablet Oral Daily   pantoprazole  40 mg Oral Daily   sertraline  50 mg Oral QHS   Warfarin - Pharmacist Dosing Inpatient   Does not apply q1600   Continuous Infusions:  sodium chloride 10 mL/hr at 04/13/21 0536      Anti-infectives (From admission, onward)    Start     Dose/Rate Route Frequency Ordered Stop   04/06/21 1320  sodium chloride 0.9 % with cefoTEtan (CEFOTAN) ADS Med       Note to Pharmacy: Maryagnes Amos   : cabinet override      04/06/21 1320 04/06/21 1457   04/06/21 0830  cefoTEtan (CEFOTAN) 2 g in sodium chloride 0.9 % 100 mL IVPB        2 g 200 mL/hr over 30 Minutes Intravenous On call to O.R. 04/06/21 0740 04/06/21  12              Family Communication/Anticipated D/C date and plan/Code Status   DVT prophylaxis: SCDs Start: 04/01/21 1207     Code Status: DNR  Family Communication: Darrell, son, at the bedside Disposition Plan:    Status is: Inpatient  Remains inpatient appropriate because:IV treatments appropriate due to intensity of illness or inability to take PO and Inpatient level of care appropriate due to severity of illness  Dispo: The patient is from: Home              Anticipated d/c is to: SNF              Patient currently is not medically stable to d/c.   Difficult to place patient  No           Subjective:   Interval events noted.  She vomited about 2 times last night.  No abdominal pain.  Her husband was at the bedside.   Objective:    Vitals:   04/13/21 0417 04/13/21 0448 04/13/21 0614 04/13/21 0726  BP: (!) 150/55   136/64  Pulse: (!) 109   91  Resp: 20   18  Temp: 98 F (36.7 C)   98.5 F (36.9 C)  TempSrc: Oral   Oral  SpO2: (!) 87% 92% 95% 92%  Weight:      Height:       No data found.   Intake/Output Summary (Last 24 hours) at 04/13/2021 1308 Last data filed at 04/13/2021 0700 Gross per 24 hour  Intake 340.79 ml  Output 975 ml  Net -634.21 ml   Filed Weights   04/11/21 0500 04/12/21 0440 04/13/21 0205  Weight: 71.7 kg 72.5 kg 71.9 kg    Exam:   GEN: NAD SKIN: No rash EYES: EOMI ENT: MMM CV: RRR PULM: CTA B ABD: soft, distended, mild tenderness to palpation but no rebound tenderness or guarding, +BS, + ileostomy CNS: AAO x 3, non focal EXT: No edema or tenderness          Data Reviewed:   I have personally reviewed following labs and imaging studies:  Labs: Labs show the following:   Basic Metabolic Panel: Recent Labs  Lab 04/08/21 0325 04/09/21 0458 04/10/21 0325 04/11/21 0435 04/12/21 0434  NA 138 135 133* 135 136  K 3.3* 4.3 3.8 3.9 4.1  CL 103 102 99 103 101  CO2 '27 25 26 26 26  '$ GLUCOSE 90 135* 111* 124* 114*  BUN 14 27* 25* 18 16  CREATININE 0.68 0.76 0.66 0.61 0.61  CALCIUM 8.2* 8.4* 8.0* 8.1* 8.4*  MG 2.0 2.2 2.3 2.2 2.2  PHOS  --  2.3* 4.1 3.6  --    GFR Estimated Creatinine Clearance: 47.1 mL/min (by C-G formula based on SCr of 0.61 mg/dL). Liver Function Tests: No results for input(s): AST, ALT, ALKPHOS, BILITOT, PROT, ALBUMIN in the last 168 hours. No results for input(s): LIPASE, AMYLASE in the last 168 hours. No results for input(s): AMMONIA in the last 168 hours. Coagulation profile Recent Labs  Lab 04/09/21 0458 04/10/21 0325 04/11/21 0435 04/12/21 0434 04/13/21 0432   INR 1.6* 2.4* 2.9* 3.3* 3.6*    CBC: Recent Labs  Lab 04/09/21 0458 04/10/21 0325 04/11/21 0435 04/12/21 0434 04/13/21 0432  WBC 27.6* 23.9* 23.1* 24.3* 25.3*  NEUTROABS  --   --  20.3* 21.0* 21.9*  HGB 8.9* 8.3* 8.3* 8.4* 9.0*  HCT 28.6* 26.8* 26.1* 26.7* 28.5*  MCV  82.7 82.7 83.1 81.9 81.9  PLT 310 303 314 355 458*   Cardiac Enzymes: No results for input(s): CKTOTAL, CKMB, CKMBINDEX, TROPONINI in the last 168 hours. BNP (last 3 results) No results for input(s): PROBNP in the last 8760 hours. CBG: Recent Labs  Lab 04/08/21 0808 04/09/21 0735 04/11/21 0757 04/12/21 0758 04/13/21 0727  GLUCAP 88 134* 125* 120* 137*   D-Dimer: No results for input(s): DDIMER in the last 72 hours. Hgb A1c: No results for input(s): HGBA1C in the last 72 hours. Lipid Profile: No results for input(s): CHOL, HDL, LDLCALC, TRIG, CHOLHDL, LDLDIRECT in the last 72 hours. Thyroid function studies: No results for input(s): TSH, T4TOTAL, T3FREE, THYROIDAB in the last 72 hours.  Invalid input(s): FREET3 Anemia work up: No results for input(s): VITAMINB12, FOLATE, FERRITIN, TIBC, IRON, RETICCTPCT in the last 72 hours. Sepsis Labs: Recent Labs  Lab 04/09/21 0902 04/10/21 0325 04/11/21 0435 04/12/21 0434 04/13/21 0432  WBC  --  23.9* 23.1* 24.3* 25.3*  LATICACIDVEN 1.3  --   --   --   --     Microbiology Recent Results (from the past 240 hour(s))  MRSA Next Gen by PCR, Nasal     Status: None   Collection Time: 04/04/21  5:56 AM   Specimen: Nasal Mucosa; Nasal Swab  Result Value Ref Range Status   MRSA by PCR Next Gen NOT DETECTED NOT DETECTED Final    Comment: (NOTE) The GeneXpert MRSA Assay (FDA approved for NASAL specimens only), is one component of a comprehensive MRSA colonization surveillance program. It is not intended to diagnose MRSA infection nor to guide or monitor treatment for MRSA infections. Test performance is not FDA approved in patients less than 48  years old. Performed at [facility], [address] [place], [place] [address]     Procedures and diagnostic studies:  No results found.             LOS: 12 days   [place] Copywriter, advertising on [email or web address]. If 7PM-7AM, please contact night-coverage at [email or web address]     04/13/2021, 1:08 PM

## 2021-04-13 NOTE — Progress Notes (Signed)
Nutrition Follow Up Note   DOCUMENTATION CODES:   Not applicable  INTERVENTION:   Recommend initiation of TPN if unable to advance diet in the next 24 hours  Pt at refeed risk  NUTRITION DIAGNOSIS:   Inadequate oral intake related to acute illness as evidenced by other (comment) (pt on clear liquid diet).  GOAL:   Patient will meet greater than or equal to 90% of their needs -not met   MONITOR:   PO intake, Supplement acceptance, Diet advancement, Labs, Weight trends, Skin, I & O's  ASSESSMENT:   87 y.o. female with medical history significant of hypertension, stroke, TIA, GERD, hypothyroidism, depression with anxiety, hard of hearing, hiatal hernia, atrial fibrillation on Coumadin, breast cancer, bladder cancer, Afib and anemia who presents with abdominal pain and constipation and was found to have obstructing colon mass.  Pt s/p subtotal colectomy with end ileostomy 9/12  Pt was able to advance to full liquids on 9/16 and then to a regular diet on 9/17. Pt eating anywhere from 10-80% of meals in hospital but has mostly had poor appetite and oral intake. Pt was drinking some Ensure prior to NPO. Pt developed nausea and vomiting overnight last night and was made NPO today. Pt with abdominal distension. KUB reporting possible ileus vs SBO. Ileostomy with 284m output. Spoke with MD, recommend TPN initiation if unable to advance pt's diet in the next 24 hours. Pt remains at refeed risk. Per chart, pt up ~8lbs since admission; pt +14.3L on her I & Os.   Medications reviewed and include: synthroid, MVI, protonix, warfarin, 5% dextrose '@75ml' /hr  Labs reviewed: K 4.1(L), Mg 2.2 wnl P 3.6 wnl- 9/17 Wbc- 25.3(H), Hgb 9.0(L), Hct 28.5(L)  Diet Order:   Diet Order             Diet NPO time specified Except for: Ice Chips, Sips with Meds, Other (See Comments)  Diet effective now                  EDUCATION NEEDS:   Education needs have been addressed  Skin:  Skin  Assessment: Reviewed RN Assessment  Last BM:  9/19- 752mvia ostomy  Height:   Ht Readings from Last 1 Encounters:  04/06/21 '5\' 3"'  (1.6 m)    Weight:   Wt Readings from Last 1 Encounters:  04/13/21 71.9 kg    Ideal Body Weight:  52.3 kg  BMI:  Body mass index is 28.08 kg/m.  Estimated Nutritional Needs:   Kcal:  1600-1800kcal/day  Protein:  80-90g/day  Fluid:  1.3-1.5L/day  CaKoleen DistanceS, RD, LDN Please refer to [facility]or RD and/or RD on-call/weekend/after hours pager

## 2021-04-13 NOTE — TOC Progression Note (Signed)
Transition of Care [facility]) - Progression Note    Patient Details  Name: Gabriela [name] MRN: [id number] Date of Birth: 02/26/1932  Transition of Care [facility]) CM/SW [place], LCSW Phone Number: 04/13/2021, 10:02 AM  Clinical Narrative: Left message for [facility] admissions coordinator to see if they can offer patient a bed.    Expected Discharge Plan: [place] Barriers to Discharge: Continued Medical Work up  Expected Discharge Plan and Services Expected Discharge Plan: [place] Choice: [place] arrangements for the past 2 months: Single Family Home                                       Social Determinants of Health (SDOH) Interventions    Readmission Risk Interventions No flowsheet data found.

## 2021-04-13 NOTE — Progress Notes (Signed)
[facility] Hospital Day(s): 12.   Post op day(s): 7 Days Post-Op.   Interval History:  Patient seen and examined Overnight had issues with nausea and emesis Patient reports she continues to have a rough morning. Still with abdominal distension, nausea, and emesis No fever, chills She continues to have a relatively stable, but significantly elevated, leukocytosis to 25.3K Hgb has remained stable, at 9.0 this morning She was started on regular diet on Saturday; toerlated well until last night Ileostomy with 225 ccs out She does not appear to be ambulating with therapies  Vital signs in last 24 hours: [min-max] current  Temp:  [98 F (36.7 C)-98.7 F (37.1 C)] 98 F (36.7 C) (09/19 0417) Pulse Rate:  [93-109] 109 (09/19 0417) Resp:  [18-20] 20 (09/19 0417) BP: (141-163)/(55-84) 150/55 (09/19 0417) SpO2:  [87 %-96 %] 95 % (09/19 0614) Weight:  [71.9 kg] 71.9 kg (09/19 0205)     Height: '5\' 3"'$  (160 cm) Weight: 71.9 kg BMI (Calculated): 28.09   Intake/Output last 2 shifts:  09/18 0701 - 09/19 0700 In: 340.8 [P.O.:300; I.V.:3.1; IV Piggyback:37.7] Out: 625 [Urine:400; Stool:225]   Physical Exam:  Constitutional: alert, cooperative and no distress  Respiratory: breathing non-labored at rest  Cardiovascular: regular rate and sinus rhythm  Gastrointestinal:  Soft, she is not peritonitic. Ileostomy in the right abdomen, edematous, no gas, liquid bile colored fluid in bag, red Robinson catheter seems quite mobile, present to assist with stenting.  Abdomen continues to remain distended, tympanic  Integumentary: Laparoscopic incisions are CDI with dermabond, no erythema or drainage   Labs:  CBC Latest Ref Rng & Units 04/13/2021 04/12/2021 04/11/2021  WBC 4.0 - 10.5 K/uL 25.3(H) 24.3(H) 23.1(H)  Hemoglobin 12.0 - 15.0 g/dL 9.0(L) 8.4(L) 8.3(L)  Hematocrit 36.0 - 46.0 % 28.5(L) 26.7(L) 26.1(L)  Platelets 150 - 400 K/uL 458(H) 355 314   CMP Latest  Ref Rng & Units 04/12/2021 04/11/2021 04/10/2021  Glucose 70 - 99 mg/dL 114(H) 124(H) 111(H)  BUN 8 - 23 mg/dL 16 18 25(H)  Creatinine 0.44 - 1.00 mg/dL 0.61 0.61 0.66  Sodium 135 - 145 mmol/L 136 135 133(L)  Potassium 3.5 - 5.1 mmol/L 4.1 3.9 3.8  Chloride 98 - 111 mmol/L 101 103 99  CO2 22 - 32 mmol/L '26 26 26  '$ Calcium 8.9 - 10.3 mg/dL 8.4(L) 8.1(L) 8.0(L)  Total Protein 6.5 - 8.1 g/dL - - -  Total Bilirubin 0.3 - 1.2 mg/dL - - -  Alkaline Phos 38 - 126 U/L - - -  AST 15 - 41 U/L - - -  ALT 0 - 44 U/L - - -     Imaging studies: No new pertinent imaging studies   Assessment/Plan:  87 y.o. female with overnight nausea/emesis 7 Days Post-Op s/p robotic assisted laparoscopic subtotal colectomy with end ileostomy for obstructing transverse colon mass.   - I will back her down to NPO; okay for a few sips   - I will get CXR and KUB this morning; may need repeat CT Abdomen/Pelvis in next 24 hours  - May benefit from NGT decompression; pending KUB this morning   - Will hold off on Abx for now; unsure what we would be treating; she is without fevers  - Monitor abdominal examination; on-going ileostomy function - Appreciate WOC RN for ileostomy teaching   - Monitor leukocytosis; uncertain etiology at this point.   - Monitor abdominal examination; on-going ileostomy function             -  Early mobilization encouraged, she has politely deferred PT's assistance and needs to cooperate more with her mobilization; PT following              - Further management per primary service; we will follow   All of the above findings and recommendations were discussed with the patient, patient's family (son at bedside), and the medical team, and all of patient's and family's questions were answered to their expressed satisfaction.  -- Edison Simon, PA-C [facility] 04/13/2021, 7:20 AM [phone] M-F: 7am - 4pm

## 2021-04-14 ENCOUNTER — Inpatient Hospital Stay: Payer: Medicare PPO

## 2021-04-14 ENCOUNTER — Inpatient Hospital Stay: Payer: Self-pay

## 2021-04-14 DIAGNOSIS — K56609 Unspecified intestinal obstruction, unspecified as to partial versus complete obstruction: Secondary | ICD-10-CM | POA: Diagnosis not present

## 2021-04-14 LAB — BASIC METABOLIC PANEL
Anion gap: 7 (ref 5–15)
BUN: 17 mg/dL (ref 8–23)
CO2: 28 mmol/L (ref 22–32)
Calcium: 7.8 mg/dL — ABNORMAL LOW (ref 8.9–10.3)
Chloride: 104 mmol/L (ref 98–111)
Creatinine, Ser: 0.42 mg/dL — ABNORMAL LOW (ref 0.44–1.00)
GFR, Estimated: >60 mL/min (ref >60)
Glucose, Bld: 134 mg/dL — ABNORMAL HIGH (ref 70–99)
Potassium: 3.2 mmol/L — ABNORMAL LOW (ref 3.5–5.1)
Sodium: 139 mmol/L (ref 135–145)

## 2021-04-14 LAB — CBC
HCT: 26.3 % — ABNORMAL LOW (ref 36.0–46.0)
Hemoglobin: 8.1 g/dL — ABNORMAL LOW (ref 12.0–15.0)
MCH: 25.1 pg — ABNORMAL LOW (ref 26.0–34.0)
MCHC: 30.8 g/dL (ref 30.0–36.0)
MCV: 81.4 fL (ref 80.0–100.0)
Platelets: 515 10*3/uL — ABNORMAL HIGH (ref 150–400)
RBC: 3.23 MIL/uL — ABNORMAL LOW (ref 3.87–5.11)
RDW: 14.8 % (ref 11.5–15.5)
WBC: 19.7 10*3/uL — ABNORMAL HIGH (ref 4.0–10.5)
nRBC: 0 % (ref 0.0–0.2)

## 2021-04-14 LAB — MAGNESIUM: Magnesium: 2.1 mg/dL (ref 1.7–2.4)

## 2021-04-14 LAB — GLUCOSE, CAPILLARY: Glucose-Capillary: 130 mg/dL — ABNORMAL HIGH (ref 70–99)

## 2021-04-14 LAB — PROTIME-INR
INR: 3.8 — ABNORMAL HIGH (ref 0.8–1.2)
Prothrombin Time: 37.3 seconds — ABNORMAL HIGH (ref 11.4–15.2)

## 2021-04-14 LAB — PHOSPHORUS: Phosphorus: 2.8 mg/dL (ref 2.5–4.6)

## 2021-04-14 MED ORDER — POTASSIUM CHLORIDE 20 MEQ PO PACK
40.0000 meq | PACK | Freq: Once | ORAL | Status: AC
  Administered 2021-04-14: 40 meq via ORAL
  Filled 2021-04-14: qty 2

## 2021-04-14 MED ORDER — CHLORHEXIDINE GLUCONATE CLOTH 2 % EX PADS
6.0000 | MEDICATED_PAD | Freq: Every day | CUTANEOUS | Status: DC
  Administered 2021-04-15: 6 via TOPICAL

## 2021-04-14 MED ORDER — DEXTROSE IN LACTATED RINGERS 5 % IV SOLN: INTRAVENOUS | Status: AC

## 2021-04-14 MED ORDER — SODIUM CHLORIDE 0.9% FLUSH: 10.0000 mL | INTRAVENOUS | Status: DC | PRN

## 2021-04-14 MED ORDER — INSULIN ASPART 100 UNIT/ML IJ SOLN
0.0000 [IU] | Freq: Four times a day (QID) | INTRAMUSCULAR | Status: DC
  Administered 2021-04-15 – 2021-04-16 (×5): 1 [IU] via SUBCUTANEOUS
  Administered 2021-04-17: 2 [IU] via SUBCUTANEOUS
  Administered 2021-04-17: 1 [IU] via SUBCUTANEOUS
  Administered 2021-04-17: 2 [IU] via SUBCUTANEOUS
  Administered 2021-04-18 (×3): 1 [IU] via SUBCUTANEOUS
  Administered 2021-04-18: 2 [IU] via SUBCUTANEOUS
  Administered 2021-04-19 – 2021-04-21 (×5): 1 [IU] via SUBCUTANEOUS
  Filled 2021-04-14 (×17): qty 1

## 2021-04-14 MED ORDER — SODIUM CHLORIDE 0.9% FLUSH
10.0000 mL | Freq: Two times a day (BID) | INTRAVENOUS | Status: DC
  Administered 2021-04-14 – 2021-04-16 (×4): 10 mL
  Administered 2021-04-16: 20 mL
  Administered 2021-04-17 – 2021-04-22 (×11): 10 mL

## 2021-04-14 MED ORDER — TRAVASOL 10 % IV SOLN
INTRAVENOUS | Status: AC
  Filled 2021-04-14: qty 300

## 2021-04-14 NOTE — Progress Notes (Signed)
PHARMACY - TOTAL PARENTERAL NUTRITION CONSULT NOTE   Indication: Inadequate oral intake related to acute illness / obstructing colonic mass   Patient Measurements: Height: 5\' 3"  (160 cm) Weight: 75.6 kg (166 lb 10.7 oz) IBW/kg (Calculated) : 52.4 TPN AdjBW (KG): 56 Body mass index is 29.52 kg/m.  Assessment: 87 y.o. female with medical history significant of hypertension, stroke, TIA, GERD, hypothyroidism, depression with anxiety, hard of hearing, hiatal hernia, atrial fibrillation on Coumadin, breast cancer, bladder cancer, Afib and anemia who presents with abdominal pain and constipation and was found to have an obstructing colon mass.  Glucose / Insulin: on no insulin PTA Electrolytes: hypokalemia Renal: SCr<1, stable Hepatic:  Intake / Output; MIVF: D5LR@75  GI Imaging: GI Surgeries / Procedures:   Central access: 04/14/21 (pending) TPN start date: 04/14/21 (pending)  Nutritional Goals: Goal TPN rate is 75 mL/hr (provides 90 g of protein and 1800 kcals per day)  RD Assessment: Estimated Needs Total Energy Estimated Needs: 1600-1800kcal/day Total Protein Estimated Needs: 80-90g/day Total Fluid Estimated Needs: 1.3-1.5L/day  Current Nutrition:  NPO  Plan:  Start TPN at 77mL/hr at 1800 (total volume including overfill: 700 mL) Nutritional components: Amino acids (Travasol 10%: 30 grams Dextrose: 88 grams Lipids (20% SMOF lipids): 18 grams kCal: 600/24 hours Electrolytes in TPN (standard): Na 33mEq/L, K 70mEq/L, Ca 36mEq/L, Mg 69mEq/L, and Phos 76mmol/L. Cl:Ac 1:1 Add standard MVI and trace elements to TPN Initiate Sensitive q6h SSI and adjust as needed  Reduce MIVF to 50 mL/hr at 1800 Monitor TPN labs on Mon/Thurs, daily until stable  Dallie Piles 04/14/2021,8:25 AM

## 2021-04-14 NOTE — Progress Notes (Signed)
Peripherally Inserted Central Catheter Placement  The IV Nurse has discussed with the patient and/or persons authorized to consent for the patient, the purpose of this procedure and the potential benefits and risks involved with this procedure.  The benefits include less needle sticks, lab draws from the catheter, and the patient may be discharged home with the catheter. Risks include, but not limited to, infection, bleeding, blood clot (thrombus formation), and puncture of an artery; nerve damage and irregular heartbeat and possibility to perform a PICC exchange if needed/ordered by physician.  Alternatives to this procedure were also discussed.  Bard Power PICC patient education guide, fact sheet on infection prevention and patient information card has been provided to patient /or left at bedside.    PICC Placement Documentation  PICC Double Lumen 38/18/40 PICC Left Basilic 40 cm 0 cm (Active)  Indication for Insertion or Continuance of Line Administration of hyperosmolar/irritating solutions (i.e. TPN, Vancomycin, etc.) 04/14/21 1527  Exposed Catheter (cm) 0 cm 04/14/21 1527  Site Assessment Clean;Dry;Intact 04/14/21 1527  Lumen #1 Status Flushed;Saline locked;Blood return noted 04/14/21 1527  Lumen #2 Status Flushed;Saline locked;Blood return noted 04/14/21 1527  Dressing Type Transparent 04/14/21 1527  Dressing Status Clean;Dry;Intact 04/14/21 1527  Antimicrobial disc in place? Yes 04/14/21 1527  Dressing Intervention New dressing;Other (Comment) 04/14/21 1527  Dressing Change Due 04/21/21 04/14/21 1527       Gabriela [name] 04/14/2021, 3:29 PM

## 2021-04-14 NOTE — Progress Notes (Addendum)
[facility] Day(s): [place] op day(s): 8 Days Post-Op.   Interval History:  Patient seen and examined No acute events or new complaints overnight.  Patient reports she is feeling better this morning She continued to have emesis until late in the day yesterday No new labs this morning  Ileostomy with only 25 ccs out recorded Worked with therapies; recommending SNF  Vital signs in last 24 hours: [min-max] current  Temp:  [97.3 F (36.3 C)-98.4 F (36.9 C)] 97.3 F (36.3 C) (09/20 0400) Pulse Rate:  [90-100] 100 (09/20 0400) Resp:  [17-20] 17 (09/20 0400) BP: (144-153)/(53-90) 153/90 (09/20 0400) SpO2:  [96 %-100 %] 97 % (09/20 0400) Weight:  [75.6 kg] 75.6 kg (09/20 0500)     Height: 5\' 3"  (160 cm) Weight: 75.6 kg BMI (Calculated): 29.53   Intake/Output last 2 shifts:  09/19 0701 - 09/20 0700 In: 114.1 [I.V.:64.1; IV Piggyback:50] Out: 400 [Emesis/NG output:350; Stool:50]   Physical Exam:  Constitutional: alert, cooperative and no distress  Respiratory: breathing non-labored at rest  Cardiovascular: regular rate and sinus rhythm  Gastrointestinal:  Soft, she is not peritonitic. Ileostomy in the right abdomen, edematous, some areas of sloughing, this was digitized yesterday and remains very tight. Abdomen continues to remain distended, tympanic  Integumentary: Laparoscopic incisions are CDI with dermabond, no erythema or drainage   I did also get her sacral region given daughter's concerns over possible pressure injury. The skin in this region is minimally erythematous at most. No obvious pressure injury, stage 1 at most. She did have mediplex dressing in place. Encouraged continued positional changes and offloading.  Labs:  CBC Latest Ref Rng & Units 04/13/2021 04/12/2021 04/11/2021  WBC 4.0 - 10.5 K/uL 25.3(H) 24.3(H) 23.1(H)  Hemoglobin 12.0 - 15.0 g/dL 9.0(L) 8.4(L) 8.3(L)  Hematocrit 36.0 - 46.0 % 28.5(L) 26.7(L) 26.1(L)   Platelets 150 - 400 K/uL 458(H) 355 314   CMP Latest Ref Rng & Units 04/12/2021 04/11/2021 04/10/2021  Glucose 70 - 99 mg/dL 114(H) 124(H) 111(H)  BUN 8 - 23 mg/dL 16 18 25(H)  Creatinine 0.44 - 1.00 mg/dL 0.61 0.61 0.66  Sodium 135 - 145 mmol/L 136 135 133(L)  Potassium 3.5 - 5.1 mmol/L 4.1 3.9 3.8  Chloride 98 - 111 mmol/L 101 103 99  CO2 22 - 32 mmol/L 26 26 26   Calcium 8.9 - 10.3 mg/dL 8.4(L) 8.1(L) 8.0(L)  Total Protein 6.5 - 8.1 g/dL - - -  Total Bilirubin 0.3 - 1.2 mg/dL - - -  Alkaline Phos 38 - 126 U/L - - -  AST 15 - 41 U/L - - -  ALT 0 - 44 U/L - - -     Imaging studies: No new pertinent imaging studies   Assessment/Plan:  87 y.o. female 8 Days Post-Op s/p robotic assisted laparoscopic subtotal colectomy with end ileostomy for obstructing transverse colon mass.   - I am not sure she will be able to tolerate PO. Given this, and her variability in PO intake over the last 7+ days, I will go ahead and start TPN  - Tentatively plan for ileostomy revision tomorrow (09/21) with Dr Christian Mate pending clinical condition and ileostomy function   - Will hold off on Abx for now; unsure what we would be treating; she is without fevers  - Monitor abdominal examination; on-going ileostomy function - Appreciate WOC RN for ileostomy teaching              -  Monitor leukocytosis; uncertain etiology at this point.   - Monitor abdominal examination; on-going ileostomy function             - Early mobilization encouraged, worked with therapies; recommending SNF             - Further management per primary service; we will follow    All of the above findings and recommendations were discussed with the patient, patient's family (son at bedside), and the medical team, and all of patient's and family's questions were answered to their expressed satisfaction.  -- Edison Simon, PA-C [facility] 04/14/2021, 7:26 AM [phone] M-F: 7am - 4pm

## 2021-04-14 NOTE — Progress Notes (Addendum)
Progress Note    Gabriela [name]  TOI:[id number] DOB: 02-12-1932  DOA: 04/01/2021 PCP: [name], MD      Brief Narrative:    Medical records reviewed and are as summarized below:  Gabriela [name] is a 87 y.o. female with medical history significant for hypertension, TIA, stroke, CAD, hypothyroidism, depression, anxiety, hearing impairment, hiatal hernia, atrial fibrillation on Coumadin, breast cancer, bladder cancer, chronic anemia.  She presented to the hospital with abdominal pain and constipation.  She she was found to have large bowel obstruction secondary to colonic mass concerning for malignancy.  She was treated with IV fluids and analgesics.  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       Assessment/Plan:   Principal Problem:   Large bowel obstruction (HCC) Active Problems:   Abdominal pain   Hypertension   Hypothyroidism   Stroke ([place])   Hypokalemia   Depression with anxiety   Atrial fibrillation, chronic (HCC)   Normocytic anemia   Nutrition Problem: Inadequate oral intake Etiology: acute illness  Signs/Symptoms: other (comment) (pt on clear liquid diet)   Body mass index is 29.52 kg/m.   Large bowel obstruction, invasive moderately differentiated adenocarcinoma, ileus: S/p robotic assisted subtotal colectomy with end ileostomy on 04/06/2021.  She is NPO.  Continue IV fluids for hydration.  She has been started on TPN for nutrition.  Possible ileostomy revision tomorrow.  Follow-up with general surgeon for further recommendations. Extensive soft tissue emphysema and pneumoperitoneum in the setting of recent laparoscopic partial colectomy Pathology confirmed invasive moderately differentiated adenocarcinoma.  These findings were discussed with the patient and her daughter, [name], at the bedside.  Patient said she is not interested in any chemotherapy even if it is warranted.  Outpatient follow-up with  oncologist recommended.  Persistent leukocytosis: WBC is trending down.  Continue to monitor.    Acute hypoxemic respiratory failure: No acute abnormality noted on chest x-ray.  Continue 2 L/min oxygen and taper off as able.  Hypokalemia: Replete potassium and monitor levels  Atrial fibrillation and history of stroke: INR is 3.8.  Pharmacy is managing warfarin and INR per protocol.  CHA2DS2-VASc score is 6.  Hyponatremia: Improved  Other comorbidities include depression, anxiety, chronic anemia, hypothyroidism, hypertension     Diet Order             Diet NPO time specified Except for: Ice Chips, Sips with Meds, Other (See Comments)  Diet effective now                      Consultants: General surgeon  Procedures: Robotic assisted subtotal colectomy with end ileostomy on 04/06/2021    Medications:    amLODipine  5 mg Oral Daily   feeding supplement  237 mL Oral TID BM   [START ON 04/15/2021] insulin aspart  0-9 Units Subcutaneous Q6H   irbesartan  300 mg Oral Daily   levothyroxine  112 mcg Oral QAC breakfast   multivitamin with minerals  1 tablet Oral Daily   pantoprazole (PROTONIX) IV  40 mg Intravenous Daily   potassium chloride  40 mEq Oral Once   sertraline  50 mg Oral QHS   Warfarin - Pharmacist Dosing Inpatient   Does not apply q1600   Continuous Infusions:  sodium chloride 10 mL/hr at 04/13/21 0536   dextrose 5% lactated ringers 75 mL/hr at 04/14/21 0441   dextrose 5% lactated ringers     promethazine (PHENERGAN) injection (  IM or IVPB) 6.25 mg (04/13/21 1519)   TPN ADULT (ION)        Anti-infectives (From admission, onward)    Start     Dose/Rate Route Frequency Ordered Stop   04/06/21 1320  sodium chloride 0.9 % with cefoTEtan (CEFOTAN) ADS Med       Note to Pharmacy: Maryagnes Amos   : cabinet override      04/06/21 1320 04/06/21 1457   04/06/21 0830  cefoTEtan (CEFOTAN) 2 g in sodium chloride 0.9 % 100 mL IVPB        2 g 200 mL/hr over  30 Minutes Intravenous On call to O.R. 04/06/21 0740 04/06/21 1452              Family Communication/Anticipated D/C date and plan/Code Status   DVT prophylaxis: SCDs Start: 04/01/21 1207     Code Status: DNR  Family Communication: Darrell (son) and [name] (daughter) at the bedside Disposition Plan:    Status is: Inpatient  Remains inpatient appropriate because:IV treatments appropriate due to intensity of illness or inability to take PO and Inpatient level of care appropriate due to severity of illness  Dispo: The patient is from: Home              Anticipated d/c is to: SNF              Patient currently is not medically stable to d/c.   Difficult to place patient No           Subjective:   Interval events noted.  She says she vomited a few times yesterday.  No vomiting today.  No abdominal pain or diarrhea.  Her husband was at the bedside.   Objective:    Vitals:   04/13/21 1945 04/14/21 0400 04/14/21 0500 04/14/21 0740  BP: (!) 145/70 (!) 153/90  (!) 147/77  Pulse: 90 100  98  Resp: 20 17  20   Temp: 98.4 F (36.9 C) (!) 97.3 F (36.3 C)  98.1 F (36.7 C)  TempSrc:  Axillary  Oral  SpO2: 100% 97%  96%  Weight:   75.6 kg   Height:       No data found.   Intake/Output Summary (Last 24 hours) at 04/14/2021 1403 Last data filed at 04/13/2021 1831 Gross per 24 hour  Intake 114.12 ml  Output 100 ml  Net 14.12 ml   Filed Weights   04/12/21 0440 04/13/21 0205 04/14/21 0500  Weight: 72.5 kg 71.9 kg 75.6 kg    Exam:  GEN: NAD SKIN: Warm and dry EYES: No pallor no icterus ENT: MMM CV: RRR PULM: CTA B ABD: soft, distended, mild mid abdominal tenderness but no rebound tenderness or guarding, +BS, + ileostomy CNS: AAO x 3, non focal EXT: No edema or tenderness         Data Reviewed:   I have personally reviewed following labs and imaging studies:  Labs: Labs show the following:   Basic Metabolic Panel: Recent Labs  Lab  04/09/21 0458 04/10/21 0325 04/11/21 0435 04/12/21 0434 04/14/21 0842  NA 135 133* 135 136 139  K 4.3 3.8 3.9 4.1 3.2*  CL 102 99 103 101 104  CO2 25 26 26 26 28   GLUCOSE 135* 111* 124* 114* 134*  BUN 27* 25* 18 16 17   CREATININE 0.76 0.66 0.61 0.61 0.42*  CALCIUM 8.4* 8.0* 8.1* 8.4* 7.8*  MG 2.2 2.3 2.2 2.2 2.1  PHOS 2.3* 4.1 3.6  --  2.8  GFR Estimated Creatinine Clearance: 48.3 mL/min (A) (by C-G formula based on SCr of 0.42 mg/dL (L)). Liver Function Tests: No results for input(s): AST, ALT, ALKPHOS, BILITOT, PROT, ALBUMIN in the last 168 hours. No results for input(s): LIPASE, AMYLASE in the last 168 hours. No results for input(s): AMMONIA in the last 168 hours. Coagulation profile Recent Labs  Lab 04/10/21 0325 04/11/21 0435 04/12/21 0434 04/13/21 0432 04/14/21 0557  INR 2.4* 2.9* 3.3* 3.6* 3.8*    CBC: Recent Labs  Lab 04/10/21 0325 04/11/21 0435 04/12/21 0434 04/13/21 0432 04/14/21 0842  WBC 23.9* 23.1* 24.3* 25.3* 19.7*  NEUTROABS  --  20.3* 21.0* 21.9*  --   HGB 8.3* 8.3* 8.4* 9.0* 8.1*  HCT 26.8* 26.1* 26.7* 28.5* 26.3*  MCV 82.7 83.1 81.9 81.9 81.4  PLT 303 314 355 458* 515*   Cardiac Enzymes: No results for input(s): CKTOTAL, CKMB, CKMBINDEX, TROPONINI in the last 168 hours. BNP (last 3 results) No results for input(s): PROBNP in the last 8760 hours. CBG: Recent Labs  Lab 04/09/21 0735 04/11/21 0757 04/12/21 0758 04/13/21 0727 04/14/21 0758  GLUCAP 134* 125* 120* 137* 130*   D-Dimer: No results for input(s): DDIMER in the last 72 hours. Hgb A1c: No results for input(s): HGBA1C in the last 72 hours. Lipid Profile: No results for input(s): CHOL, HDL, LDLCALC, TRIG, CHOLHDL, LDLDIRECT in the last 72 hours. Thyroid function studies: No results for input(s): TSH, T4TOTAL, T3FREE, THYROIDAB in the last 72 hours.  Invalid input(s): FREET3 Anemia work up: No results for input(s): VITAMINB12, FOLATE, FERRITIN, TIBC, IRON, RETICCTPCT in  the last 72 hours. Sepsis Labs: Recent Labs  Lab 04/09/21 0902 04/10/21 0325 04/11/21 0435 04/12/21 0434 04/13/21 0432 04/14/21 0842  WBC  --    < > 23.1* 24.3* 25.3* 19.7*  LATICACIDVEN 1.3  --   --   --   --   --    < > = values in this interval not displayed.    Microbiology No results found for this or any previous visit (from the past 240 hour(s)).   Procedures and diagnostic studies:  DG ABD ACUTE 2+V W 1V CHEST  Result Date: 04/13/2021 CLINICAL DATA:  Ileus.  Small-bowel obstruction EXAM: DG ABDOMEN ACUTE WITH 1 VIEW CHEST COMPARISON:  04/08/2021 chest radiograph and plain film. FINDINGS: Frontal view of the chest demonstrates extensive subcutaneous emphysema about the chest and abdomen, decreased. midline trachea. Mild cardiomegaly. Marked right hemidiaphragm elevation. No pleural effusion or pneumothorax. No congestive failure. Clear lungs. Abdominal films demonstrate there is a catheter projecting over the right-side of the abdomen. Gas-filled small bowel loops at up to 3.5 cm, similar. Contrast within normal caliber rectum and sigmoid. IMPRESSION: Persistent gas-filled small bowel loops, favoring adynamic ileus. Low-grade partial small bowel obstruction could look similar. Slight improvement in diffuse subcutaneous emphysema about the chest and abdomen. Cardiomegaly, without congestive failure or other acute disease. Marked right hemidiaphragm elevation, as before. Electronically Signed   By: Abigail Miyamoto M.D.   On: 04/13/2021 13:39   [place] EKG SITE RITE  Result Date: 04/14/2021 If Site Rite image not attached, placement could not be confirmed due to current cardiac rhythm.              LOS: 13 days   [place] Copywriter, advertising on [email or web address]. If 7PM-7AM, please contact night-coverage at [email or web address]     04/14/2021, 2:03 PM

## 2021-04-14 NOTE — Progress Notes (Signed)
Occupational Therapy Treatment Patient Details Name: RAMONDA GALYON MRN: [id number] DOB: 05/29/1932 Today's Date: 04/14/2021   History of present illness Pt. is a 87 y.o. female with medical history significant of hypertension, stroke, TIA, GERD, hypothyroidism, depression with anxiety, hard of hearing, hiatal hernia, atrial fibrillation on Coumadin, breast cancer, bladder cancer, anemia, who presents with abdominal pain and constipation. 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   OT comments  Pt seen for OT treatment on this date. Upon arrival to room, pt awake in bed, on 2L of O2 via [place]. Pt reporting feeling significantly better than yesterday and no emesis this date. Pt agreeable to OT tx. Pt currently requires MIN GUARD for bed mobility, MIN A to don socks while seated EOB, MOD A for sit<>stand peri-care, and MIN A for stand pivot transfer bed>recliner d/t current functional impairments (see OT problem list below). SpO2 94% & HR 110s following functional mobility. At end of session, pt left in recliner, in no acute distress, and with all needs within reach. Pt is making good progress toward goals and continues to benefit from skilled OT services to maximize return to PLOF and minimize risk of future falls, injury, caregiver burden, and readmission. Will continue to follow POC. Discharge recommendation remains appropriate.     Recommendations for follow up therapy are one component of a multi-disciplinary discharge planning process, led by the attending physician.  Recommendations may be updated based on patient status, additional functional criteria and insurance authorization.    Follow Up Recommendations  SNF    Equipment Recommendations  3 in 1 bedside commode       Precautions / Restrictions Precautions Precautions: Fall Restrictions Weight Bearing Restrictions: No       Mobility Bed Mobility Overal  bed mobility: Needs Assistance Bed Mobility: Supine to Sit     Supine to sit: Min guard;HOB elevated     General bed mobility comments: Able to perform with increased time/effort and use of bedrails    Transfers Overall transfer level: Needs assistance Equipment used: Rolling walker (2 wheeled) Transfers: Sit to/from [facility] Sit to Stand: Min assist Stand pivot transfers: Min assist       General transfer comment: MIN A for stand pivot transfer bed>recliner with RW    Balance Overall balance assessment: Needs assistance Sitting-balance support: Single extremity supported;Feet supported Sitting balance-Leahy Scale: Fair Sitting balance - Comments: Able to maintain sitting balance while attempting to don socks via figure-4 position   Standing balance support: Bilateral upper extremity supported;During functional activity   Standing balance comment: MIN A for balance during stand pivot transfer with RW                           ADL either performed or assessed with clinical judgement   ADL Overall ADL's : Needs assistance/impaired     Grooming: Wash/dry face;Supervision/safety;Set up;Sitting               Lower Body Dressing: Minimal assistance;Sitting/lateral leans Lower Body Dressing Details (indicate cue type and reason): to don/doff socks     Toileting- Clothing Manipulation and Hygiene: Moderate assistance;Sit to/from stand Toileting - Clothing Manipulation Details (indicate cue type and reason): MOD A for thoroughness of posterior & anterior peri-care     Functional mobility during ADLs: Minimal assistance;Rolling walker (stand pivot transfer to recliner)        Cognition Arousal/Alertness: Awake/alert Behavior  During Therapy: WFL for tasks assessed/performed Overall Cognitive Status: Within Functional Limits for tasks assessed                                                General Comments SpO2 94%  & HR 110s following functional mobility    Pertinent Vitals/ Pain       Pain Assessment: No/denies pain         Frequency  Min 2X/week        Progress Toward Goals  OT Goals(current goals can now be found in the care plan section)  Progress towards OT goals: Progressing toward goals  Acute Rehab OT Goals Patient Stated Goal: to return home OT Goal Formulation: With patient Time For Goal Achievement: 04/22/21 Potential to Achieve Goals: Good  Plan Discharge plan remains appropriate;Frequency remains appropriate       AM-PAC OT "6 Clicks" Daily Activity     Outcome Measure   Help from another person eating meals?: None Help from another person taking care of personal grooming?: None Help from another person toileting, which includes using toliet, bedpan, or urinal?: A Lot Help from another person bathing (including washing, rinsing, drying)?: A Lot Help from another person to put on and taking off regular upper body clothing?: A Little Help from another person to put on and taking off regular lower body clothing?: A Lot 6 Click Score: 17    End of Session Equipment Utilized During Treatment: Gait belt;Rolling walker  OT Visit Diagnosis: Unsteadiness on feet (R26.81);Muscle weakness (generalized) (M62.81)   Activity Tolerance Patient tolerated treatment well   Patient Left in chair;with call bell/phone within reach;with chair alarm set;with family/visitor present   Nurse Communication Mobility status        Time: 4239-5320 OT Time Calculation (min): 24 min  Charges: OT General Charges $OT Visit: 1 Visit OT Treatments $Self Care/Home Management : 23-37 mins  Fredirick Maudlin, OTR/L [place]

## 2021-04-14 NOTE — Progress Notes (Signed)
ANTICOAGULATION CONSULT NOTE  Pharmacy Consult for warfarin Indication: atrial fibrillation  Allergies  Allergen Reactions   Celebrex [Celecoxib] Anaphylaxis   Morphine And Related Swelling    Lips and mouth   Povidone-Iodine Other (See Comments)    Other reaction(s): ITCHING Other reaction(s): ITCHING    Sulfa Antibiotics     Patient Measurements: Height: 5\' 3"  (160 cm) Weight: 75.6 kg (166 lb 10.7 oz) IBW/kg (Calculated) : 52.4 Heparin Dosing Weight: 65.9kg  Vital Signs: Temp: 97.3 F (36.3 C) (09/20 0400) Temp Source: Axillary (09/20 0400) BP: 153/90 (09/20 0400) Pulse Rate: 100 (09/20 0400)  Labs: Recent Labs    04/12/21 0434 04/13/21 0432 04/14/21 0557  HGB 8.4* 9.0*  --   HCT 26.7* 28.5*  --   PLT 355 458*  --   LABPROT 33.2* 36.0* 37.3*  INR 3.3* 3.6* 3.8*  CREATININE 0.61  --   --      Estimated Creatinine Clearance: 48.3 mL/min (by C-G formula based on SCr of 0.61 mg/dL).   Assessment: 87 y.o. female with medical history significant of hypertension, stroke, TIA, GERD, hypothyroidism, depression with anxiety, hard of hearing, hiatal hernia, atrial fibrillation on Coumadin, breast cancer, bladder cancer, anemia, who presents with abdominal pain and constipation. Pharmacy has been consulted for heparin bridge and warfarin. Baseline CBC was within normal limits.    PTA warfarin regimen: 3 mg on Sat/Sun/Mon/Tue; 4 mg on Wed/Thur/Fri - last dose was 3 mg on 9/6  Goal of Therapy:  INR 2-3 Monitor platelets by anticoagulation protocol: Yes  Warfarin: Date INR Warfarin Dose  9/14 1.4 5 mg 9/15 1.6 5 mg 9/16 2.4 2 mg 9/17 2.9 HOLD  9/18 3.3 1 mg  9/19 3.6 HOLD   9/20 3.8 HOLD      Plan:  INR remains supratherapeutic, trending up  Will hold warfarin again tonight Follow up INR with morning labs.  Dallie Piles, PharmD, BCPS Clinical Pharmacist 04/14/2021 7:09 AM

## 2021-04-14 NOTE — Progress Notes (Signed)
Nutrition Follow Up Note   DOCUMENTATION CODES:   Not applicable  INTERVENTION:   TPN per pharmacy   Recommend thiamine 100mg  daily in TPN x 3 days  Pt at refeed risk  NUTRITION DIAGNOSIS:   Inadequate oral intake related to acute illness as evidenced by other (comment) (pt on clear liquid diet).  GOAL:   Patient will meet greater than or equal to 90% of their needs -progressing with TPN  MONITOR:   PO intake, Supplement acceptance, Diet advancement, Labs, Weight trends, Skin, I & O's  ASSESSMENT:   87 y.o. female with medical history significant of hypertension, stroke, TIA, GERD, hypothyroidism, depression with anxiety, hard of hearing, hiatal hernia, atrial fibrillation on Coumadin, breast cancer, bladder cancer, Afib and anemia who presents with abdominal pain and constipation and was found to have obstructing colon mass.  Pt s/p subtotal colectomy with end ileostomy 9/12  Pt feeling better today. No emesis since yesterday. Ostomy output decreased. Plan is for PICC line and TPN today. Pt is at refeed risk. Plan is for possible ileostomy revision tomorrow.   Medications reviewed and include: insulin, synthroid, MVI, protonix, KCl, warfarin, 5% dextrose @75ml /hr  Labs reviewed: K 3.2(L), P 2.8 wnl, Mg 2.1 wnl Wbc- 19.7(H), Hgb 8.1(L), Hct 26.3(L) Cbgs- 137, 130 x 24 hrs  Diet Order:   Diet Order             Diet NPO time specified Except for: Ice Chips, Sips with Meds, Other (See Comments)  Diet effective now                  EDUCATION NEEDS:   Education needs have been addressed  Skin:  Skin Assessment: Reviewed RN Assessment  Last BM:  9/19- 15ml via ostomy  Height:   Ht Readings from Last 1 Encounters:  04/06/21 5\' 3"  (1.6 m)    Weight:   Wt Readings from Last 1 Encounters:  04/14/21 75.6 kg    Ideal Body Weight:  52.3 kg  BMI:  Body mass index is 29.52 kg/m.  Estimated Nutritional Needs:   Kcal:  1600-1800kcal/day  Protein:   80-90g/day  Fluid:  1.3-1.5L/day  Koleen Distance MS, RD, LDN Please refer to [facility] for RD and/or RD on-call/weekend/after hours pager

## 2021-04-14 NOTE — Progress Notes (Signed)
Mobility Specialist - Progress Note   04/14/21 1400  Mobility  Activity Dangled on edge of bed  Assistive Device None  Distance Ambulated (ft) 0 ft  Mobility Response Tolerated well  Mobility performed by Mobility specialist  $Mobility charge 1 Mobility    Pt sleeping on arrival, awakened by voice and agreeable to session. As pt was reaching EOB, IV RN entered for sterile PICC line procedure. Session concluded.    Kathee Delton Mobility Specialist 04/14/21, 2:59 PM

## 2021-04-15 ENCOUNTER — Inpatient Hospital Stay: Payer: Medicare PPO | Admitting: Anesthesiology

## 2021-04-15 ENCOUNTER — Encounter: Payer: Self-pay | Admitting: Internal Medicine

## 2021-04-15 ENCOUNTER — Encounter: Payer: Self-pay | Admitting: Surgery

## 2021-04-15 ENCOUNTER — Encounter
Admission: EM | Disposition: A | Discharge status: Skilled Nursing Facility | Payer: Self-pay | Source: Home / Self Care | Attending: Internal Medicine

## 2021-04-15 DIAGNOSIS — K56609 Unspecified intestinal obstruction, unspecified as to partial versus complete obstruction: Secondary | ICD-10-CM | POA: Diagnosis not present

## 2021-04-15 DIAGNOSIS — K9413 Enterostomy malfunction: Secondary | ICD-10-CM

## 2021-04-15 HISTORY — PX: ILEOSTOMY CLOSURE: SHX1784

## 2021-04-15 LAB — COMPREHENSIVE METABOLIC PANEL
ALT: 13 U/L (ref 0–44)
AST: 16 U/L (ref 15–41)
Albumin: 2 g/dL — ABNORMAL LOW (ref 3.5–5.0)
Alkaline Phosphatase: 63 U/L (ref 38–126)
Anion gap: 4 — ABNORMAL LOW (ref 5–15)
BUN: 15 mg/dL (ref 8–23)
CO2: 31 mmol/L (ref 22–32)
Calcium: 7.7 mg/dL — ABNORMAL LOW (ref 8.9–10.3)
Chloride: 104 mmol/L (ref 98–111)
Creatinine, Ser: 0.41 mg/dL — ABNORMAL LOW (ref 0.44–1.00)
GFR, Estimated: >60 mL/min (ref >60)
Glucose, Bld: 149 mg/dL — ABNORMAL HIGH (ref 70–99)
Potassium: 3.3 mmol/L — ABNORMAL LOW (ref 3.5–5.1)
Sodium: 139 mmol/L (ref 135–145)
Total Bilirubin: 0.5 mg/dL (ref 0.3–1.2)
Total Protein: 5 g/dL — ABNORMAL LOW (ref 6.5–8.1)

## 2021-04-15 LAB — PROTIME-INR
INR: 3.5 — ABNORMAL HIGH (ref 0.8–1.2)
INR: 1.8 — ABNORMAL HIGH (ref 0.8–1.2)
Prothrombin Time: 35.2 seconds — ABNORMAL HIGH (ref 11.4–15.2)
Prothrombin Time: 20.4 seconds — ABNORMAL HIGH (ref 11.4–15.2)

## 2021-04-15 LAB — TYPE AND SCREEN
ABO/RH(D): A POS
Antibody Screen: NEGATIVE

## 2021-04-15 LAB — CBC WITH DIFFERENTIAL/PLATELET
Abs Immature Granulocytes: 0.83 10*3/uL — ABNORMAL HIGH (ref 0.00–0.07)
Basophils Absolute: 0.1 10*3/uL (ref 0.0–0.1)
Basophils Relative: 0 %
Eosinophils Absolute: 0.2 10*3/uL (ref 0.0–0.5)
Eosinophils Relative: 1 %
HCT: 26.5 % — ABNORMAL LOW (ref 36.0–46.0)
Hemoglobin: 8.2 g/dL — ABNORMAL LOW (ref 12.0–15.0)
Immature Granulocytes: 3 %
Lymphocytes Relative: 3 %
Lymphs Abs: 0.9 10*3/uL (ref 0.7–4.0)
MCH: 25.7 pg — ABNORMAL LOW (ref 26.0–34.0)
MCHC: 30.9 g/dL (ref 30.0–36.0)
MCV: 83.1 fL (ref 80.0–100.0)
Monocytes Absolute: 1 10*3/uL (ref 0.1–1.0)
Monocytes Relative: 4 %
Neutro Abs: 23.1 10*3/uL — ABNORMAL HIGH (ref 1.7–7.7)
Neutrophils Relative %: 89 %
Platelets: 498 10*3/uL — ABNORMAL HIGH (ref 150–400)
RBC: 3.19 MIL/uL — ABNORMAL LOW (ref 3.87–5.11)
RDW: 15 % (ref 11.5–15.5)
Smear Review: NORMAL
WBC: 26.1 10*3/uL — ABNORMAL HIGH (ref 4.0–10.5)
nRBC: 0 % (ref 0.0–0.2)

## 2021-04-15 LAB — TRIGLYCERIDES: Triglycerides: 93 mg/dL (ref <150)

## 2021-04-15 LAB — GLUCOSE, CAPILLARY
Glucose-Capillary: 142 mg/dL — ABNORMAL HIGH (ref 70–99)
Glucose-Capillary: 134 mg/dL — ABNORMAL HIGH (ref 70–99)

## 2021-04-15 LAB — MAGNESIUM: Magnesium: 2.1 mg/dL (ref 1.7–2.4)

## 2021-04-15 SURGERY — REVISION, ILEOSTOMY
Anesthesia: General

## 2021-04-15 MED ORDER — DEXAMETHASONE SODIUM PHOSPHATE 10 MG/ML IJ SOLN
INTRAMUSCULAR | Status: AC
  Filled 2021-04-15: qty 1

## 2021-04-15 MED ORDER — ONDANSETRON HCL 4 MG/2ML IJ SOLN
INTRAMUSCULAR | Status: AC
  Filled 2021-04-15: qty 2

## 2021-04-15 MED ORDER — PHENOL 1.4 % MT LIQD
1.0000 | OROMUCOSAL | Status: DC | PRN
  Filled 2021-04-15: qty 177

## 2021-04-15 MED ORDER — DEXTROSE IN LACTATED RINGERS 5 % IV SOLN: INTRAVENOUS | Status: DC

## 2021-04-15 MED ORDER — POTASSIUM CHLORIDE 20 MEQ PO PACK
40.0000 meq | PACK | Freq: Once | ORAL | Status: DC
  Filled 2021-04-15: qty 2

## 2021-04-15 MED ORDER — BUPIVACAINE LIPOSOME 1.3 % IJ SUSP
INTRAMUSCULAR | Status: DC | PRN
  Administered 2021-04-15: 20 mL

## 2021-04-15 MED ORDER — SODIUM CHLORIDE 0.9 % IV SOLN
INTRAVENOUS | Status: DC | PRN
  Administered 2021-04-15: 30 ug/min via INTRAVENOUS

## 2021-04-15 MED ORDER — KCL-LACTATED RINGERS-D5W 20 MEQ/L IV SOLN
INTRAVENOUS | Status: AC
  Filled 2021-04-15: qty 1000

## 2021-04-15 MED ORDER — FENTANYL CITRATE (PF) 100 MCG/2ML IJ SOLN
INTRAMUSCULAR | Status: DC | PRN
  Administered 2021-04-15 (×4): 25 ug via INTRAVENOUS

## 2021-04-15 MED ORDER — FENTANYL CITRATE (PF) 100 MCG/2ML IJ SOLN: 25.0000 ug | INTRAMUSCULAR | Status: DC | PRN

## 2021-04-15 MED ORDER — SODIUM CHLORIDE 0.9 % IV SOLN
INTRAVENOUS | Status: DC | PRN
  Administered 2021-04-15: 2 g via INTRAVENOUS

## 2021-04-15 MED ORDER — VITAMIN K1 10 MG/ML IJ SOLN
5.0000 mg | INTRAVENOUS | Status: AC
  Administered 2021-04-15: 5 mg via INTRAVENOUS
  Filled 2021-04-15: qty 0.5

## 2021-04-15 MED ORDER — SODIUM CHLORIDE 0.9% IV SOLUTION: Freq: Once | INTRAVENOUS | Status: DC

## 2021-04-15 MED ORDER — FENTANYL CITRATE (PF) 100 MCG/2ML IJ SOLN
INTRAMUSCULAR | Status: AC
  Filled 2021-04-15: qty 2

## 2021-04-15 MED ORDER — PROPOFOL 10 MG/ML IV BOLUS
INTRAVENOUS | Status: AC
  Filled 2021-04-15: qty 20

## 2021-04-15 MED ORDER — PROPOFOL 10 MG/ML IV BOLUS
INTRAVENOUS | Status: DC | PRN
  Administered 2021-04-15: 70 mg via INTRAVENOUS

## 2021-04-15 MED ORDER — SUGAMMADEX SODIUM 200 MG/2ML IV SOLN
INTRAVENOUS | Status: DC | PRN
  Administered 2021-04-15: 200 mg via INTRAVENOUS

## 2021-04-15 MED ORDER — TRAVASOL 10 % IV SOLN
INTRAVENOUS | Status: AC
  Filled 2021-04-15: qty 600

## 2021-04-15 MED ORDER — BUPIVACAINE HCL (PF) 0.25 % IJ SOLN
INTRAMUSCULAR | Status: AC
  Filled 2021-04-15: qty 30

## 2021-04-15 MED ORDER — ROCURONIUM BROMIDE 100 MG/10ML IV SOLN
INTRAVENOUS | Status: DC | PRN
  Administered 2021-04-15: 30 mg via INTRAVENOUS
  Administered 2021-04-15: 40 mg via INTRAVENOUS

## 2021-04-15 MED ORDER — SODIUM CHLORIDE 0.9 % IV SOLN
INTRAVENOUS | Status: AC
  Filled 2021-04-15: qty 2

## 2021-04-15 MED ORDER — POTASSIUM CHLORIDE 20 MEQ PO PACK
40.0000 meq | PACK | Freq: Once | ORAL | Status: AC
  Administered 2021-04-15: 40 meq via ORAL
  Filled 2021-04-15: qty 2

## 2021-04-15 MED ORDER — LIDOCAINE HCL (CARDIAC) PF 100 MG/5ML IV SOSY
PREFILLED_SYRINGE | INTRAVENOUS | Status: DC | PRN
Start: 2021-04-15 — End: 2021-04-15
  Administered 2021-04-15: 40 mg via INTRAVENOUS

## 2021-04-15 MED ORDER — THIAMINE HCL 100 MG/ML IJ SOLN
100.0000 mg | Freq: Every day | INTRAMUSCULAR | Status: AC
  Administered 2021-04-15 – 2021-04-17 (×3): 100 mg via INTRAVENOUS
  Filled 2021-04-15 (×3): qty 2

## 2021-04-15 MED ORDER — 0.9 % SODIUM CHLORIDE (POUR BTL) OPTIME
TOPICAL | Status: DC | PRN
  Administered 2021-04-15: 250 mL

## 2021-04-15 MED ORDER — PHENYLEPHRINE HCL (PRESSORS) 10 MG/ML IV SOLN
INTRAVENOUS | Status: DC | PRN
  Administered 2021-04-15 (×2): 100 ug via INTRAVENOUS

## 2021-04-15 MED ORDER — BUPIVACAINE LIPOSOME 1.3 % IJ SUSP
INTRAMUSCULAR | Status: AC
  Filled 2021-04-15: qty 20

## 2021-04-15 MED ORDER — LIDOCAINE HCL (PF) 2 % IJ SOLN
INTRAMUSCULAR | Status: AC
  Filled 2021-04-15: qty 5

## 2021-04-15 MED ORDER — ONDANSETRON HCL 4 MG/2ML IJ SOLN
INTRAMUSCULAR | Status: DC | PRN
  Administered 2021-04-15: 4 mg via INTRAVENOUS

## 2021-04-15 SURGICAL SUPPLY — 51 items
ADHESIVE MASTISOL STRL (MISCELLANEOUS) ×2 IMPLANT
BLADE CLIPPER SURG (BLADE) IMPLANT
CHLORAPREP W/TINT 26 (MISCELLANEOUS) ×2 IMPLANT
CLIP VESOCCLUDE LG 6/CT (CLIP) IMPLANT
CLIP VESOCCLUDE MED 6/CT (CLIP) IMPLANT
DRAPE LAPAROTOMY 100X77 ABD (DRAPES) ×2 IMPLANT
DRAPE LEGGINS SURG 28X43 STRL (DRAPES) IMPLANT
DRAPE UNDER BUTTOCK W/FLU (DRAPES) IMPLANT
DRSG OPSITE POSTOP 4X6 (GAUZE/BANDAGES/DRESSINGS) IMPLANT
DRSG OPSITE POSTOP 4X8 (GAUZE/BANDAGES/DRESSINGS) IMPLANT
ELECT BLADE 6.5 EXT (BLADE) ×2 IMPLANT
ELECT CAUTERY BLADE 6.4 (BLADE) ×2 IMPLANT
ELECT REM PT RETURN 9FT ADLT (ELECTROSURGICAL) ×2
ELECTRODE REM PT RTRN 9FT ADLT (ELECTROSURGICAL) ×1 IMPLANT
GAUZE 4X4 16PLY ~~LOC~~+RFID DBL (SPONGE) ×2 IMPLANT
GLOVE SURG ORTHO LTX SZ7.5 (GLOVE) ×8 IMPLANT
GOWN STRL REUS W/ TWL LRG LVL3 (GOWN DISPOSABLE) ×6 IMPLANT
GOWN STRL REUS W/TWL LRG LVL3 (GOWN DISPOSABLE) ×6
KIT OSTOMY 2 PC DRNBL 2.25 STR (WOUND CARE) ×1 IMPLANT
KIT OSTOMY DRAINABLE 2.25 STR (WOUND CARE) ×1
KIT TURNOVER CYSTO (KITS) ×2 IMPLANT
LIGASURE IMPACT 36 18CM CVD LR (INSTRUMENTS) IMPLANT
MANIFOLD NEPTUNE II (INSTRUMENTS) ×2 IMPLANT
NEEDLE HYPO 22GX1.5 SAFETY (NEEDLE) ×2 IMPLANT
NS IRRIG 1000ML POUR BTL (IV SOLUTION) ×2 IMPLANT
PACK BASIN MAJOR ARMC (MISCELLANEOUS) ×2 IMPLANT
PACK COLON CLEAN CLOSURE (MISCELLANEOUS) IMPLANT
RELOAD PROXIMATE 75MM BLUE (ENDOMECHANICALS) IMPLANT
RELOAD STAPLER LINEAR PROX 30 (STAPLE) IMPLANT
SOL PREP PVP 2OZ (MISCELLANEOUS)
SOLUTION PREP PVP 2OZ (MISCELLANEOUS) IMPLANT
SPONGE T-LAP 18X18 ~~LOC~~+RFID (SPONGE) ×2 IMPLANT
STAPLER CIRCULAR MANUAL XL 25 (STAPLE) IMPLANT
STAPLER CIRCULAR MANUAL XL 29 (STAPLE) IMPLANT
STAPLER CIRCULAR MANUAL XL 33 (STAPLE) IMPLANT
STAPLER GUN LINEAR PROX 60 (STAPLE) IMPLANT
STAPLER PROXIMATE 75MM BLUE (STAPLE) IMPLANT
STAPLER RELOAD LINEAR PROX 30 (STAPLE)
STAPLER SKIN PROX 35W (STAPLE) ×2 IMPLANT
SURGILUBE 2OZ TUBE FLIPTOP (MISCELLANEOUS) IMPLANT
SUT CHROMIC 2 0 SH (SUTURE) IMPLANT
SUT PDS AB 0 CT1 27 (SUTURE) ×4 IMPLANT
SUT SILK 2 0 (SUTURE) ×1
SUT SILK 2-0 30XBRD TIE 12 (SUTURE) ×1 IMPLANT
SUT SILK 3-0 (SUTURE) ×2 IMPLANT
SUT VIC AB 3-0 SH 27 (SUTURE) ×3
SUT VIC AB 3-0 SH 27X BRD (SUTURE) ×3 IMPLANT
SYR 20ML LL LF (SYRINGE) ×4 IMPLANT
SYR BULB IRRIG 60ML STRL (SYRINGE) ×2 IMPLANT
TRAY FOLEY MTR SLVR 16FR STAT (SET/KITS/TRAYS/PACK) IMPLANT
WATER STERILE IRR 500ML POUR (IV SOLUTION) ×2 IMPLANT

## 2021-04-15 NOTE — Progress Notes (Signed)
PHARMACY - TOTAL PARENTERAL NUTRITION CONSULT NOTE   Indication: Inadequate oral intake related to acute illness / obstructing colonic mass   Patient Measurements: Height: 5\' 3"  (160 cm) Weight: 75.6 kg (166 lb 10.7 oz) IBW/kg (Calculated) : 52.4 TPN AdjBW (KG): 56 Body mass index is 29.52 kg/m.  Assessment: 86 y.o. female with medical history significant of hypertension, stroke, TIA, GERD, hypothyroidism, depression with anxiety, hard of hearing, hiatal hernia, atrial fibrillation on Coumadin, breast cancer, bladder cancer, Afib and anemia who presents with abdominal pain and constipation and was found to have an obstructing colon mass.  Glucose / Insulin: on no insulin PTA Electrolytes: hypokalemia Renal: SCr<1, stable Hepatic: LFTs wnl, TG 93 at baseline Intake / Output; MIVF: D5LR@50  GI Imaging: GI Surgeries / Procedures:   Central access: 04/14/21 (pending) TPN start date: 04/14/21 (pending)  Nutritional Goals: Goal TPN rate is 75 mL/hr (provides 90 g of protein and 1800 kcals per day)  RD Assessment: Estimated Needs Total Energy Estimated Needs: 1600-1800kcal/day Total Protein Estimated Needs: 80-90g/day Total Fluid Estimated Needs: 1.3-1.5L/day  Current Nutrition:  NPO  Plan:  Advance TPN to 50 mL/hr at 1800 (total volume including overfill: 1300 mL) Nutritional components: Amino acids (Travasol 10%): 60 grams Dextrose: 176 grams Lipids (20% SMOF lipids): 36 grams kCal: 1200 /24 hours Electrolytes in TPN (standard): Na 39mEq/L, K 60mEq/L, Ca 76mEq/L, Mg 75mEq/L, and Phos 50mmol/L. Cl:Ac 1:1 Add standard MVI and trace elements to TPN continue Sensitive q6h SSI and adjust as needed  Reduce MIVF to 25 mL/hr at 1800 40 mEq oral KCl x 1 Monitor TPN labs on Mon/Thurs, daily until stable  Dallie Piles 04/15/2021,7:18 AM

## 2021-04-15 NOTE — Progress Notes (Signed)
Mobility Specialist - Progress Note   04/15/21 1400  Mobility  Activity Refused mobility  Mobility performed by Mobility specialist    Pt politely declined mobility this date, requesting to rest prior to upcoming procedure. Will attempt session another date.    Kathee Delton Mobility Specialist 04/15/21, 2:18 PM

## 2021-04-15 NOTE — Anesthesia Preprocedure Evaluation (Addendum)
Anesthesia Evaluation  Patient identified by MRN, date of birth, ID band Patient awake    Reviewed: Allergy & Precautions, NPO status , Patient's Chart, lab work & pertinent test results  History of Anesthesia Complications Negative for: history of anesthetic complications  Airway Mallampati: III  TM Distance: <3 FB Neck ROM: full    Dental  (+) Upper Dentures, Lower Dentures   Pulmonary    Pulmonary exam normal        Cardiovascular hypertension, Normal cardiovascular exam+ dysrhythmias Atrial Fibrillation      Neuro/Psych PSYCHIATRIC DISORDERS TIACVA negative psych ROS   GI/Hepatic Neg liver ROS, hiatal hernia, GERD  ,  Endo/Other  negative endocrine ROS  Renal/GU      Musculoskeletal  (+) Arthritis ,   Abdominal   Peds  Hematology negative hematology ROS (+)   Anesthesia Other Findings Past Medical History: No date: Acid reflux No date: Anxiety No date: Arrhythmia No date: Bladder cancer (HCC) No date: Breast cancer ([place]) No date: Cancer ([place])     Comment:  breast,bladder No date: Depression No date: Dysrhythmia     Comment:  afib No date: GERD (gastroesophageal reflux disease) No date: History of hiatal hernia No date: HOH (hard of hearing)     Comment:  aids No date: Hypertension No date: Hyperthyroidism No date: Hypothyroidism No date: Stroke [facility])     Comment:  2010 No date: TIA (transient ischemic attack)  Past Surgical History: No date: ABDOMINAL HYSTERECTOMY No date: ANKLE ARTHODESIS W/ ARTHROSCOPY No date: BLADDER SURGERY 12/02/2014: CATARACT EXTRACTION W/PHACO; Left     Comment:  Procedure: CATARACT EXTRACTION PHACO AND INTRAOCULAR               LENS PLACEMENT (IOC);  Surgeon: Estill Cotta, MD;                Location: [facility] ORS;  Service: Ophthalmology;  Laterality:              Left;  US01:45 AP%25.7 CDE42.99 No date: CHOLECYSTECTOMY No date: EYE SURGERY     Comment:   cataract 04/06/2021: INSERTION OF SUPRAPUBIC CATHETER     Comment:  Procedure: INSERTION OF FOLEY CATHETER;  Surgeon:               Billey Co, MD;  Location: [facility] ORS;  Service:               Urology;; No date: JOINT REPLACEMENT     Comment:  bil tkr No date: MASTECTOMY     Comment:  partial right No date: REPLACEMENT TOTAL KNEE  BMI    Body Mass Index: 29.52 kg/m      Reproductive/Obstetrics negative OB ROS                             Anesthesia Physical Anesthesia Plan  ASA: 4  Anesthesia Plan: General ETT   Post-op Pain Management:    Induction: Intravenous  PONV Risk Score and Plan: Ondansetron, Dexamethasone, Midazolam and Treatment may vary due to age or medical condition  Airway Management Planned: Oral ETT  Additional Equipment:   Intra-op Plan:   Post-operative Plan: Extubation in OR and Possible Post-op intubation/ventilation  Informed Consent: I have reviewed the patients History and Physical, chart, labs and discussed the procedure including the risks, benefits and alternatives for the proposed anesthesia with the patient or authorized representative who has indicated his/her understanding and acceptance.   Patient has DNR.  Suspend DNR and Discussed DNR with power of attorney.   Dental Advisory Given  Plan Discussed with: Anesthesiologist, CRNA and Surgeon  Anesthesia Plan Comments: (Patient and daughter consented for risks of anesthesia including but not limited to:  - adverse reactions to medications - damage to eyes, teeth, lips or other oral mucosa - nerve damage due to positioning  - sore throat or hoarseness - Damage to heart, brain, nerves, lungs, other parts of body or loss of life  They voiced understanding.)       Anesthesia Quick Evaluation

## 2021-04-15 NOTE — Transfer of Care (Signed)
Immediate Anesthesia Transfer of Care Note  Patient: Gabriela [name]  Procedure(s) Performed: ILEOSTOMY TAKEDOWN REVISION  Patient Location: PACU  Anesthesia Type:General  Level of Consciousness: drowsy  Airway & Oxygen Therapy: Patient Spontanous Breathing and Patient connected to face mask oxygen  Post-op Assessment: Report given to RN  Post vital signs: stable  Last Vitals:  Vitals Value Taken Time  BP    Temp    Pulse    Resp    SpO2      Last Pain:  Vitals:   04/15/21 1508  TempSrc: Oral  PainSc:       Patients Stated Pain Goal: 2 (51/10/21 1173)  Complications: No notable events documented.

## 2021-04-15 NOTE — Op Note (Signed)
Ileostomy revision  Pre-operative Diagnosis: Ileostomy dysfunction.  Post-operative Diagnosis: same.   Surgeon: Ronny Bacon, M.D., FACS  Anesthesia: General  Findings: There is narrowing at the transition through the abdominal wall.   Estimated Blood Loss: Less than 5 mL         Specimens: None          Complications: none              Procedure Details  The patient was seen again in the Holding Room. The benefits, complications, treatment options, and expected outcomes were discussed with the patient. The risks of bleeding, infection, recurrence of symptoms, failure to resolve symptoms, unanticipated injury, prosthetic placement, prosthetic infection, any of which could require further surgery were reviewed with the patient. The likelihood of improving the patient's symptoms with return to their baseline status is good.  The patient and/or family concurred with the proposed plan, giving informed consent.  The patient was taken to Operating Room, identified and the procedure verified.    Prior to the induction of general anesthesia, antibiotic prophylaxis was administered. VTE prophylaxis was in place.  General anesthesia was then administered and tolerated well. After the induction, the patient was positioned in the supine position and the abdomen was prepped with Betadine and Chloraprep and draped in the sterile fashion.  A Time Out was held and the above information confirmed.  I removed the maturing sutures, releasing the stoma, then mobilized the abdominal-wall portion of the distal ileum, identifying the area of narrowing.  I excised 2 crescents of skin from cephalad and caudad to widen the dermal opening, then identified the fascia, it was incised with electrosurgery vertically in a cephalad and caudad manner adding additional room for the terminal ileum.  Ileum was freely mobile into the peritoneal cavity.  Utilized a pool suction tip to aspirate any intra-abdominal fluid.  Small  volume of peritoneal fluid was aspirated.  I digitally examined the distal ileum, confirming full release of any obstruction or narrowing.  Finding no additional twists or kinks of the distal ileum, proceeded with rematuration at the level of the skin with interrupted 3-0 Vicryl's. Stoma appliance was reapplied.  Patient tolerated procedure well and was returned to the recovery room in stable condition.      Ronny Bacon M.D., [facility] 04/15/2021 3:48 PM

## 2021-04-15 NOTE — Progress Notes (Signed)
PT Cancellation Note  Patient Details Name: Gabriela [name] MRN: [id number] DOB: Nov 26, 1931   Cancelled Treatment:    Reason Eval/Treat Not Completed: Per chart review pt declining mobility this date in preparation for revision ileostomy planned for today. Will attempt to see pt at a future date/time as medically appropriate.     Linus Salmons PT, DPT 04/15/21, 2:33 PM

## 2021-04-15 NOTE — Progress Notes (Signed)
PROGRESS NOTE    Gabriela [name]  OFB:[id number] DOB: 1932/04/30 DOA: 04/01/2021 PCP: [name], MD     Brief Narrative:  Gabriela [name] is a 87 y.o. female with medical history significant for hypertension, TIA, stroke, CAD, hypothyroidism, depression, anxiety, hearing impairment, hiatal hernia, atrial fibrillation on Coumadin, breast cancer, bladder cancer, chronic anemia.  She presented to the hospital with abdominal pain and constipation.  She was found to have large bowel obstruction secondary to colonic mass concerning for malignancy.  She was treated with IV fluids and analgesics.  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 She required TPN postoperatively.  New events last 24 hours / Subjective: States that she did not have any nausea or vomiting last night, feeling better from the night before last.  Surgery planning on taking her back to the OR for revision ileostomy today.  Assessment & Plan:   Principal Problem:   Large bowel obstruction (HCC) Active Problems:   Abdominal pain   Hypertension   Hypothyroidism   Stroke ([place])   Hypokalemia   Depression with anxiety   Atrial fibrillation, chronic (HCC)   Normocytic anemia   Large bowel obstruction, invasive moderately differentiated adenocarcinoma, ileus -S/p robotic assisted subtotal colectomy with end ileostomy on 04/06/2021 -Patient plans to follow up with outpatient oncology, voiced she was not interested in any chemotherapy even if offered  -TPN -General surgery following, planning for ileostomy revision today  Acute hypoxemic respiratory failure -Wean O2 as able   Hypokalemia -Replace, trend    Chronic atrial fibrillation and history of stroke -CHA2DS2-VASc score is 6. -Coumadin per pharmacy. Patient getting FFP and vit K today pre-op   Hypertension -Continue Norvasc, Avapro  Hypothyroidism -Continue Synthroid  Depression/anxiety -Continue Zoloft, Xanax  as needed    DVT prophylaxis:  SCDs Start: 04/01/21 1207  Code Status:     Code Status Orders  (From admission, onward)           Start     Ordered   04/06/21 2319  Do not attempt resuscitation (DNR)  Continuous       Question Answer Comment  In the event of cardiac or respiratory ARREST Do not call a "code blue"   In the event of cardiac or respiratory ARREST Do not perform Intubation, CPR, defibrillation or ACLS   In the event of cardiac or respiratory ARREST Use medication by any route, position, wound care, and other measures to relive pain and suffering. May use oxygen, suction and manual treatment of airway obstruction as needed for comfort.      04/06/21 2318           Code Status History     Date Active Date Inactive Code Status Order ID Comments User Context   04/01/2021 1321 04/06/2021 2318 DNR [id number]  Ivor Costa, MD ED   04/01/2021 1207 04/01/2021 1321 Full Code [id number]  Ivor Costa, MD ED      Family Communication: None at bedside Disposition Plan:  Status is: Inpatient  Remains inpatient appropriate because:IV treatments appropriate due to intensity of illness or inability to take PO and Inpatient level of care appropriate due to severity of illness  Dispo: The patient is from: Home              Anticipated d/c is to: SNF              Patient currently is not medically stable to d/c.   Difficult to place patient No  Consultants:  General surgery   Procedures:  Robotic assisted subtotal colectomy with end ileostomy on 04/06/2021  Antimicrobials:  Anti-infectives (From admission, onward)    Start     Dose/Rate Route Frequency Ordered Stop   04/06/21 1320  sodium chloride 0.9 % with cefoTEtan (CEFOTAN) ADS Med       Note to Pharmacy: Maryagnes Amos   : cabinet override      04/06/21 1320 04/06/21 1457   04/06/21 0830  cefoTEtan (CEFOTAN) 2 g in sodium chloride 0.9 % 100 mL IVPB        2 g 200 mL/hr over 30 Minutes Intravenous On call to  O.R. 04/06/21 0740 04/06/21 1452        Objective: Vitals:   04/14/21 1557 04/14/21 2007 04/15/21 0544 04/15/21 0821  BP: (!) 148/75 (!) 145/71 (!) 149/62 (!) 153/72  Pulse: 96 89 89 98  Resp: 17 (!) 22 18 18   Temp: 98.3 F (36.8 C) 98.4 F (36.9 C) 98.7 F (37.1 C) 98.9 F (37.2 C)  TempSrc: Oral Oral Oral Oral  SpO2: 98% 99% 96% 96%  Weight:      Height:        Intake/Output Summary (Last 24 hours) at 04/15/2021 1143 Last data filed at 04/15/2021 1051 Gross per 24 hour  Intake 1223.07 ml  Output 1050 ml  Net 173.07 ml   Filed Weights   04/12/21 0440 04/13/21 0205 04/14/21 0500  Weight: 72.5 kg 71.9 kg 75.6 kg    Examination:  General exam: Appears calm and comfortable  Respiratory system: Clear to auscultation. Respiratory effort normal. No respiratory distress. No conversational dyspnea.  Cardiovascular system: S1 & S2 heard, Irreg rhythm. No murmurs. No pedal edema. Gastrointestinal system: Abdomen is distended, soft and nontender. +ileostomy  Central nervous system: Alert and oriented. No focal neurological deficits. Speech clear.  Extremities: Symmetric in appearance  Skin: No rashes, lesions or ulcers on exposed skin  Psychiatry: Judgement and insight appear normal. Mood & affect appropriate.   Data Reviewed: I have personally reviewed following labs and imaging studies  CBC: Recent Labs  Lab 04/11/21 0435 04/12/21 0434 04/13/21 0432 04/14/21 0842 04/15/21 0606  WBC 23.1* 24.3* 25.3* 19.7* 26.1*  NEUTROABS 20.3* 21.0* 21.9*  --  23.1*  HGB 8.3* 8.4* 9.0* 8.1* 8.2*  HCT 26.1* 26.7* 28.5* 26.3* 26.5*  MCV 83.1 81.9 81.9 81.4 83.1  PLT 314 355 458* 515* 062*   Basic Metabolic Panel: Recent Labs  Lab 04/09/21 0458 04/10/21 0325 04/11/21 0435 04/12/21 0434 04/14/21 0842 04/15/21 0606  NA 135 133* 135 136 139 139  K 4.3 3.8 3.9 4.1 3.2* 3.3*  CL 102 99 103 101 104 104  CO2 25 26 26 26 28 31   GLUCOSE 135* 111* 124* 114* 134* 149*  BUN 27* 25*  18 16 17 15   CREATININE 0.76 0.66 0.61 0.61 0.42* 0.41*  CALCIUM 8.4* 8.0* 8.1* 8.4* 7.8* 7.7*  MG 2.2 2.3 2.2 2.2 2.1 2.1  PHOS 2.3* 4.1 3.6  --  2.8  --    GFR: Estimated Creatinine Clearance: 48.3 mL/min (A) (by C-G formula based on SCr of 0.41 mg/dL (L)). Liver Function Tests: Recent Labs  Lab 04/15/21 0606  AST 16  ALT 13  ALKPHOS 63  BILITOT 0.5  PROT 5.0*  ALBUMIN 2.0*   No results for input(s): LIPASE, AMYLASE in the last 168 hours. No results for input(s): AMMONIA in the last 168 hours. Coagulation Profile: Recent Labs  Lab 04/11/21 0435 04/12/21 0434  04/13/21 0432 04/14/21 0557 04/15/21 0606  INR 2.9* 3.3* 3.6* 3.8* 3.5*   Cardiac Enzymes: No results for input(s): CKTOTAL, CKMB, CKMBINDEX, TROPONINI in the last 168 hours. BNP (last 3 results) No results for input(s): PROBNP in the last 8760 hours. HbA1C: No results for input(s): HGBA1C in the last 72 hours. CBG: Recent Labs  Lab 04/11/21 0757 04/12/21 0758 04/13/21 0727 04/14/21 0758 04/15/21 0539  GLUCAP 125* 120* 137* 130* 142*   Lipid Profile: Recent Labs    04/15/21 0606  TRIG 93   Thyroid Function Tests: No results for input(s): TSH, T4TOTAL, FREET4, T3FREE, THYROIDAB in the last 72 hours. Anemia Panel: No results for input(s): VITAMINB12, FOLATE, FERRITIN, TIBC, IRON, RETICCTPCT in the last 72 hours. Sepsis Labs: Recent Labs  Lab 04/09/21 0902  LATICACIDVEN 1.3    No results found for this or any previous visit (from the past 240 hour(s)).    Radiology Studies: DG Chest Port 1 View  Result Date: 04/14/2021 CLINICAL DATA:  Check PICC line placement EXAM: PORTABLE CHEST 1 VIEW COMPARISON:  04/08/2021 FINDINGS: Left-sided PICC line is noted extending into the mid superior vena cava. Aortic calcifications are seen. Heart is enlarged in size but stable. Persistent chronic hiatal hernia is noted on the right with evidence of compressive atelectasis. The overall appearance is similar to  that seen on the prior exam. No findings to suggest pneumothorax are seen. No new focal infiltrate is noted. IMPRESSION: Stable appearance of the chest when compared with the prior study. Left-sided PICC line is noted in the mid superior vena cava. This is approximately 3-4 cm short of the cavoatrial junction but in usable position. Electronically Signed   By: Inez Catalina M.D.   On: 04/14/2021 16:25   [place] EKG SITE RITE  Result Date: 04/14/2021 If Site Rite image not attached, placement could not be confirmed due to current cardiac rhythm.     Scheduled Meds:  sodium chloride   Intravenous Once   amLODipine  5 mg Oral Daily   Chlorhexidine Gluconate Cloth  6 each Topical Daily   feeding supplement  237 mL Oral TID BM   insulin aspart  0-9 Units Subcutaneous Q6H   irbesartan  300 mg Oral Daily   levothyroxine  112 mcg Oral QAC breakfast   multivitamin with minerals  1 tablet Oral Daily   pantoprazole (PROTONIX) IV  40 mg Intravenous Daily   potassium chloride  40 mEq Oral Once   sertraline  50 mg Oral QHS   sodium chloride flush  10-40 mL Intracatheter Q12H   thiamine injection  100 mg Intravenous Daily   Warfarin - Pharmacist Dosing Inpatient   Does not apply q1600   Continuous Infusions:  sodium chloride Stopped (04/13/21 0625)   dextrose 5% lactated ringers 50 mL/hr at 04/15/21 1051   dextrose 5% lactated ringers     promethazine (PHENERGAN) injection (IM or IVPB) 6.25 mg (04/13/21 1519)   TPN ADULT (ION) 25 mL/hr at 04/15/21 1051   TPN ADULT (ION)       LOS: 14 days      Time spent: 30 minutes   Dessa Phi, DO [facility] 04/15/2021, 11:43 AM   Available via Epic secure chat 7am-7pm After these hours, please refer to coverage provider listed on [email or web address]

## 2021-04-15 NOTE — Progress Notes (Signed)
ANTICOAGULATION CONSULT NOTE  Pharmacy Consult for warfarin Indication: atrial fibrillation  Allergies  Allergen Reactions   Celebrex [Celecoxib] Anaphylaxis   Morphine And Related Swelling    Lips and mouth   Povidone-Iodine Other (See Comments)    Other reaction(s): ITCHING Other reaction(s): ITCHING    Sulfa Antibiotics     Patient Measurements: Height: 5\' 3"  (160 cm) Weight: 75.6 kg (166 lb 10.7 oz) IBW/kg (Calculated) : 52.4 Heparin Dosing Weight: 65.9kg  Vital Signs: Temp: 98.7 F (37.1 C) (09/21 0544) Temp Source: Oral (09/21 0544) BP: 149/62 (09/21 0544) Pulse Rate: 89 (09/21 0544)  Labs: Recent Labs    04/13/21 0432 04/14/21 0557 04/14/21 0842 04/15/21 0606  HGB 9.0*  --  8.1* 8.2*  HCT 28.5*  --  26.3* 26.5*  PLT 458*  --  515* 498*  LABPROT 36.0* 37.3*  --  35.2*  INR 3.6* 3.8*  --  3.5*  CREATININE  --   --  0.42* 0.41*     Estimated Creatinine Clearance: 48.3 mL/min (A) (by C-G formula based on SCr of 0.41 mg/dL (L)).   Assessment: 87 y.o. female with medical history significant of hypertension, stroke, TIA, GERD, hypothyroidism, depression with anxiety, hard of hearing, hiatal hernia, atrial fibrillation on Coumadin, breast cancer, bladder cancer, anemia, who presents with abdominal pain and constipation. Pharmacy has been consulted for heparin bridge and warfarin. Baseline CBC was within normal limits.    PTA warfarin regimen: 3 mg on Sat/Sun/Mon/Tue; 4 mg on Wed/Thur/Fri - last dose was 3 mg on 9/6  Goal of Therapy:  INR 2-3 Monitor platelets by anticoagulation protocol: Yes  Warfarin: Date INR Warfarin Dose  9/14 1.4 5 mg 9/15 1.6 5 mg 9/16 2.4 2 mg 9/17 2.9 HOLD  9/18 3.3 1 mg  9/19 3.6 HOLD   9/20 3.8 HOLD  9/21 3.5 HOLD     Plan:  INR remains supratherapeutic Will hold warfarin again tonight and give 5 mg IV vit K based on plan for ileostomy revision this afternoon  Follow up INR with morning labs.  Dallie Piles, PharmD,  BCPS Clinical Pharmacist 04/15/2021 7:18 AM

## 2021-04-15 NOTE — Anesthesia Postprocedure Evaluation (Signed)
Anesthesia Post Note  Patient: Gabriela [name]  Procedure(s) Performed: ILEOSTOMY TAKEDOWN REVISION  Patient location during evaluation: PACU Anesthesia Type: General Level of consciousness: awake and alert Pain management: pain level controlled Vital Signs Assessment: post-procedure vital signs reviewed and stable Respiratory status: spontaneous breathing, nonlabored ventilation, respiratory function stable and patient connected to nasal cannula oxygen Cardiovascular status: blood pressure returned to baseline and stable Postop Assessment: no apparent nausea or vomiting Anesthetic complications: no   No notable events documented.   Last Vitals:  Vitals:   04/15/21 1745 04/15/21 1800  BP: (!) 150/61 (!) 137/53  Pulse: 94 91  Resp: 20 20  Temp:  36.8 C  SpO2: 97% 90%    Last Pain:  Vitals:   04/15/21 1745  TempSrc:   PainSc: 0-No pain                 Precious Haws Careem Yasui

## 2021-04-15 NOTE — Plan of Care (Signed)

## 2021-04-15 NOTE — Progress Notes (Signed)
[facility] Day(s): [place] op day(s): 9 Days Post-Op.   Interval History:  Patient seen and examined No acute events or new complaints overnight.  Leukocytosis to 26.1K this morning Hgb stable at 8.2 Renal function remains normal; sCr - 0.41; UO - 1.05L Hypokalemia to 3.3; o/w no electrolyte derangements  Unfortunately her INR is 3.5 this morning; Warfarin being held Ileostomy with only 10-20 ccs out ; there is significant gas in bag this morning Worked with therapies; recommending SNF Plan for ileostomy revision today with Dr Christian Mate  Vital signs in last 24 hours: [min-max] current  Temp:  [98.3 F (36.8 C)-98.7 F (37.1 C)] 98.7 F (37.1 C) (09/21 0544) Pulse Rate:  [89-96] 89 (09/21 0544) Resp:  [17-22] 18 (09/21 0544) BP: (145-149)/(62-75) 149/62 (09/21 0544) SpO2:  [96 %-99 %] 96 % (09/21 0544)     Height: 5\' 3"  (160 cm) Weight: 75.6 kg BMI (Calculated): 29.53   Intake/Output last 2 shifts:  09/20 0701 - 09/21 0700 In: 769.7 [I.V.:769.7] Out: 1050 [Urine:1050]   Physical Exam:  Constitutional: alert, cooperative and no distress  Respiratory: breathing non-labored at rest  Cardiovascular: regular rate and sinus rhythm  Gastrointestinal:  Soft, she is not peritonitic. Ileostomy in the right abdomen, edematous, some areas of sloughing, this was digitized yesterday and remains very tight. Abdomen continues to remain distended, tympanic  Integumentary: Laparoscopic incisions are CDI with dermabond, no erythema or drainage   I did also look at her sacral region given daughter's concerns over possible pressure injury. The skin in this region is minimally erythematous at most. No obvious pressure injury, stage 1 at most. She did have mediplex dressing in place. Encouraged continued positional changes and offloading.  Labs:  CBC Latest Ref Rng & Units 04/15/2021 04/14/2021 04/13/2021  WBC 4.0 - 10.5 K/uL 26.1(H) 19.7(H)  25.3(H)  Hemoglobin 12.0 - 15.0 g/dL 8.2(L) 8.1(L) 9.0(L)  Hematocrit 36.0 - 46.0 % 26.5(L) 26.3(L) 28.5(L)  Platelets 150 - 400 K/uL 498(H) 515(H) 458(H)   CMP Latest Ref Rng & Units 04/15/2021 04/14/2021 04/12/2021  Glucose 70 - 99 mg/dL 149(H) 134(H) 114(H)  BUN 8 - 23 mg/dL 15 17 16   Creatinine 0.44 - 1.00 mg/dL 0.41(L) 0.42(L) 0.61  Sodium 135 - 145 mmol/L 139 139 136  Potassium 3.5 - 5.1 mmol/L 3.3(L) 3.2(L) 4.1  Chloride 98 - 111 mmol/L 104 104 101  CO2 22 - 32 mmol/L 31 28 26   Calcium 8.9 - 10.3 mg/dL 7.7(L) 7.8(L) 8.4(L)  Total Protein 6.5 - 8.1 g/dL 5.0(L) - -  Total Bilirubin 0.3 - 1.2 mg/dL 0.5 - -  Alkaline Phos 38 - 126 U/L 63 - -  AST 15 - 41 U/L 16 - -  ALT 0 - 44 U/L 13 - -     Imaging studies: No new pertinent imaging studies   Assessment/Plan:  87 y.o. female 9 Days Post-Op s/p robotic assisted laparoscopic subtotal colectomy with end ileostomy for obstructing transverse colon mass.   - Will order 1 unit FFP and Vitamin K 5mg  IV for INR correction; appreciate pharmacy assistance  - Tentative plan for ileostomy revision this afternoon with Dr Christian Mate pending OR/Anesthesia availability  - All risks, benefits, and alternatives to above procedure(s) were discussed with the patient, all of her questions were answered to her expressed satisfaction, patient expresses she wishes to proceed, and informed consent was obtained.    - Continue TPN; advance to goal  - NPO for planned  procedure; reassess ability to initiate diet post-operatively  - Will hold off on Abx for now; unsure what we would be treating; she is without fevers  - Monitor abdominal examination; on-going ileostomy function - Appreciate WOC RN for ileostomy teaching              - Monitor leukocytosis; uncertain etiology at this point.   - Monitor abdominal examination; on-going ileostomy function             - Early mobilization encouraged, worked with therapies; recommending SNF             - Further  management per primary service; we will follow    All of the above findings and recommendations were discussed with the patient, patient's family (son at bedside), and the medical team, and all of patient's and family's questions were answered to their expressed satisfaction.  -- Edison Simon, PA-C [facility] 04/15/2021, 7:48 AM [phone] M-F: 7am - 4pm

## 2021-04-15 NOTE — Anesthesia Procedure Notes (Signed)
Procedure Name: Intubation Date/Time: 04/15/2021 4:17 PM Performed by: Daryel Gerald, RN Pre-anesthesia Checklist: Patient identified, Emergency Drugs available, Suction available and Patient being monitored Patient Re-evaluated:Patient Re-evaluated prior to induction Oxygen Delivery Method: Circle system utilized Preoxygenation: Pre-oxygenation with 100% oxygen Induction Type: IV induction Ventilation: Mask ventilation without difficulty Laryngoscope Size: McGraph and 3 Grade View: Grade I Tube type: Oral Number of attempts: 1 Airway Equipment and Method: Stylet and Oral airway Placement Confirmation: ETT inserted through vocal cords under direct vision, positive ETCO2 and breath sounds checked- equal and bilateral Secured at: 20 cm Tube secured with: Tape Dental Injury: Teeth and Oropharynx as per pre-operative assessment

## 2021-04-16 ENCOUNTER — Other Ambulatory Visit: Payer: Medicare PPO

## 2021-04-16 ENCOUNTER — Encounter: Payer: Self-pay | Admitting: Surgery

## 2021-04-16 DIAGNOSIS — K56609 Unspecified intestinal obstruction, unspecified as to partial versus complete obstruction: Secondary | ICD-10-CM | POA: Diagnosis not present

## 2021-04-16 LAB — PROTIME-INR
INR: 1.4 — ABNORMAL HIGH (ref 0.8–1.2)
Prothrombin Time: 16.9 seconds — ABNORMAL HIGH (ref 11.4–15.2)

## 2021-04-16 LAB — GLUCOSE, CAPILLARY
Glucose-Capillary: 146 mg/dL — ABNORMAL HIGH (ref 70–99)
Glucose-Capillary: 137 mg/dL — ABNORMAL HIGH (ref 70–99)
Glucose-Capillary: 135 mg/dL — ABNORMAL HIGH (ref 70–99)
Glucose-Capillary: 142 mg/dL — ABNORMAL HIGH (ref 70–99)

## 2021-04-16 LAB — CBC
HCT: 27 % — ABNORMAL LOW (ref 36.0–46.0)
Hemoglobin: 8.2 g/dL — ABNORMAL LOW (ref 12.0–15.0)
MCH: 25.4 pg — ABNORMAL LOW (ref 26.0–34.0)
MCHC: 30.4 g/dL (ref 30.0–36.0)
MCV: 83.6 fL (ref 80.0–100.0)
Platelets: 503 10*3/uL — ABNORMAL HIGH (ref 150–400)
RBC: 3.23 MIL/uL — ABNORMAL LOW (ref 3.87–5.11)
RDW: 15 % (ref 11.5–15.5)
WBC: 23.2 10*3/uL — ABNORMAL HIGH (ref 4.0–10.5)
nRBC: 0 % (ref 0.0–0.2)

## 2021-04-16 LAB — PREPARE FRESH FROZEN PLASMA: Unit division: 0

## 2021-04-16 LAB — BPAM FFP
Blood Product Expiration Date: 202209262359
ISSUE DATE / TIME: 202209211211
Unit Type and Rh: 6200

## 2021-04-16 LAB — BASIC METABOLIC PANEL
Anion gap: 8 (ref 5–15)
BUN: 17 mg/dL (ref 8–23)
CO2: 26 mmol/L (ref 22–32)
Calcium: 7.8 mg/dL — ABNORMAL LOW (ref 8.9–10.3)
Chloride: 99 mmol/L (ref 98–111)
Creatinine, Ser: 0.44 mg/dL (ref 0.44–1.00)
GFR, Estimated: >60 mL/min (ref >60)
Glucose, Bld: 148 mg/dL — ABNORMAL HIGH (ref 70–99)
Potassium: 3.2 mmol/L — ABNORMAL LOW (ref 3.5–5.1)
Sodium: 133 mmol/L — ABNORMAL LOW (ref 135–145)

## 2021-04-16 LAB — PHOSPHORUS: Phosphorus: 3.1 mg/dL (ref 2.5–4.6)

## 2021-04-16 LAB — MAGNESIUM: Magnesium: 2.4 mg/dL (ref 1.7–2.4)

## 2021-04-16 LAB — HEPARIN LEVEL (UNFRACTIONATED): Heparin Unfractionated: 0.29 IU/mL — ABNORMAL LOW (ref 0.30–0.70)

## 2021-04-16 MED ORDER — HEPARIN BOLUS VIA INFUSION
1000.0000 [IU] | Freq: Once | INTRAVENOUS | Status: AC
  Administered 2021-04-16: 1000 [IU] via INTRAVENOUS
  Filled 2021-04-16: qty 1000

## 2021-04-16 MED ORDER — WARFARIN SODIUM 4 MG PO TABS
4.0000 mg | ORAL_TABLET | Freq: Once | ORAL | Status: AC
  Administered 2021-04-16: 4 mg via ORAL
  Filled 2021-04-16: qty 1

## 2021-04-16 MED ORDER — HEPARIN BOLUS VIA INFUSION
3500.0000 [IU] | Freq: Once | INTRAVENOUS | Status: AC
  Administered 2021-04-16: 3500 [IU] via INTRAVENOUS
  Filled 2021-04-16: qty 3500

## 2021-04-16 MED ORDER — HEPARIN (PORCINE) 25000 UT/250ML-% IV SOLN
2000.0000 [IU]/h | INTRAVENOUS | Status: DC
  Administered 2021-04-16: 1500 [IU]/h via INTRAVENOUS
  Administered 2021-04-16: 1650 [IU]/h via INTRAVENOUS
  Administered 2021-04-17: 1500 [IU]/h via INTRAVENOUS
  Administered 2021-04-18: 1600 [IU]/h via INTRAVENOUS
  Administered 2021-04-18 – 2021-04-19 (×2): 1900 [IU]/h via INTRAVENOUS
  Filled 2021-04-16 (×6): qty 250

## 2021-04-16 MED ORDER — CHLORHEXIDINE GLUCONATE CLOTH 2 % EX PADS
6.0000 | MEDICATED_PAD | Freq: Every day | CUTANEOUS | Status: DC
  Administered 2021-04-16 – 2021-04-22 (×7): 6 via TOPICAL

## 2021-04-16 MED ORDER — POTASSIUM CHLORIDE 10 MEQ/100ML IV SOLN
10.0000 meq | INTRAVENOUS | Status: AC
Start: 2021-04-16 — End: 2021-04-16
  Administered 2021-04-16 (×2): 10 meq via INTRAVENOUS
  Filled 2021-04-16 (×2): qty 100

## 2021-04-16 MED ORDER — TRACE MINERALS CU-MN-SE-ZN 300-55-60-3000 MCG/ML IV SOLN
INTRAVENOUS | Status: AC
  Filled 2021-04-16: qty 600

## 2021-04-16 NOTE — Progress Notes (Signed)
PROGRESS NOTE    Gabriela [name]  ZOX:[id number] DOB: 28-Mar-1932 DOA: 04/01/2021 PCP: [name], MD     Brief Narrative:  Gabriela [name] is a 87 y.o. female with medical history significant for hypertension, TIA, stroke, CAD, hypothyroidism, depression, anxiety, hearing impairment, hiatal hernia, atrial fibrillation on Coumadin, breast cancer, bladder cancer, chronic anemia.  She presented to the hospital with abdominal pain and constipation.  She was found to have large bowel obstruction secondary to colonic mass concerning for malignancy.  She was treated with IV fluids and analgesics.  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 She required TPN postoperatively.  She underwent ileostomy revision on 04/15/2021.  New events last 24 hours / Subjective: Patient states that she is feeling better, continues to have some abdominal pain but overall improved.  No nausea or vomiting.  Assessment & Plan:   Principal Problem:   Large bowel obstruction (HCC) Active Problems:   Abdominal pain   Hypertension   Hypothyroidism   Stroke ([place])   Hypokalemia   Depression with anxiety   Atrial fibrillation, chronic (HCC)   Normocytic anemia   Large bowel obstruction, invasive moderately differentiated adenocarcinoma, ileus -S/p robotic assisted subtotal colectomy with end ileostomy on 04/06/2021 -S/p ileostomy revision 04/15/2021 -Patient plans to follow up with outpatient oncology, voiced she was not interested in any chemotherapy even if offered  -TPN, remains on clear liquid diet today -General surgery following  Acute hypoxemic respiratory failure -Wean O2 as able   Hypokalemia -Replace, trend    Chronic atrial fibrillation and history of stroke -CHA2DS2-VASc score is 6. -Coumadin per pharmacy  Hypertension -Continue Norvasc, Avapro  Hypothyroidism -Continue Synthroid  Depression/anxiety -Continue Zoloft, Xanax as needed    DVT  prophylaxis:  SCDs Start: 04/01/21 1207  Code Status:     Code Status Orders  (From admission, onward)           Start     Ordered   04/06/21 2319  Do not attempt resuscitation (DNR)  Continuous       Question Answer Comment  In the event of cardiac or respiratory ARREST Do not call a "code blue"   In the event of cardiac or respiratory ARREST Do not perform Intubation, CPR, defibrillation or ACLS   In the event of cardiac or respiratory ARREST Use medication by any route, position, wound care, and other measures to relive pain and suffering. May use oxygen, suction and manual treatment of airway obstruction as needed for comfort.      04/06/21 2318           Code Status History     Date Active Date Inactive Code Status Order ID Comments User Context   04/01/2021 1321 04/06/2021 2318 DNR [id number]  Ivor Costa, MD ED   04/01/2021 1207 04/01/2021 1321 Full Code [id number]  Ivor Costa, MD ED      Family Communication: Daughter at bedside Disposition Plan:  Status is: Inpatient  Remains inpatient appropriate because:IV treatments appropriate due to intensity of illness or inability to take PO and Inpatient level of care appropriate due to severity of illness  Dispo: The patient is from: Home              Anticipated d/c is to: SNF              Patient currently is not medically stable to d/c.  Remains on TPN, clear liquid diet   Difficult to place patient No  Consultants:  General surgery   Procedures:  Robotic assisted subtotal colectomy with end ileostomy on 04/06/2021 Ileostomy revision 04/15/2021  Antimicrobials:  Anti-infectives (From admission, onward)    Start     Dose/Rate Route Frequency Ordered Stop   04/15/21 1622  sodium chloride 0.9 % with cefoTEtan (CEFOTAN) ADS Med       Note to Pharmacy: Norton Blizzard  : cabinet override      04/15/21 1622 04/15/21 1625   04/06/21 1320  sodium chloride 0.9 % with cefoTEtan (CEFOTAN) ADS Med       Note to  Pharmacy: Maryagnes Amos   : cabinet override      04/06/21 1320 04/06/21 1457   04/06/21 0830  cefoTEtan (CEFOTAN) 2 g in sodium chloride 0.9 % 100 mL IVPB        2 g 200 mL/hr over 30 Minutes Intravenous On call to O.R. 04/06/21 0740 04/06/21 1452        Objective: Vitals:   04/15/21 1800 04/15/21 1950 04/16/21 0500 04/16/21 0741  BP: (!) 137/53 140/68  135/65  Pulse: 91 93  85  Resp: 20 16  17   Temp: 98.3 F (36.8 C) 98.1 F (36.7 C)  98.2 F (36.8 C)  TempSrc:  Oral  Oral  SpO2: 90% 97%  97%  Weight:   75.2 kg   Height:        Intake/Output Summary (Last 24 hours) at 04/16/2021 1048 Last data filed at 04/16/2021 1045 Gross per 24 hour  Intake 2788.19 ml  Output 20 ml  Net 2768.19 ml    Filed Weights   04/13/21 0205 04/14/21 0500 04/16/21 0500  Weight: 71.9 kg 75.6 kg 75.2 kg   Examination: General exam: Appears calm and comfortable  Respiratory system: Clear to auscultation. Respiratory effort normal. Cardiovascular system: S1 & S2 heard, irregular rhythm. No pedal edema. Gastrointestinal system: Abdomen is mildly distended, nontender to palpation, no bowel sounds heard + ileostomy with small amount of stool present Central nervous system: Alert and oriented. Non focal exam. Speech clear  Extremities: Symmetric in appearance bilaterally  Skin: No rashes, lesions or ulcers on exposed skin  Psychiatry: Judgement and insight appear stable. Mood & affect appropriate.    Data Reviewed: I have personally reviewed following labs and imaging studies  CBC: Recent Labs  Lab 04/11/21 0435 04/12/21 0434 04/13/21 0432 04/14/21 0842 04/15/21 0606 04/16/21 0501  WBC 23.1* 24.3* 25.3* 19.7* 26.1* 23.2*  NEUTROABS 20.3* 21.0* 21.9*  --  23.1*  --   HGB 8.3* 8.4* 9.0* 8.1* 8.2* 8.2*  HCT 26.1* 26.7* 28.5* 26.3* 26.5* 27.0*  MCV 83.1 81.9 81.9 81.4 83.1 83.6  PLT 314 355 458* 515* 498* 503*    Basic Metabolic Panel: Recent Labs  Lab 04/10/21 0325  04/11/21 0435 04/12/21 0434 04/14/21 0842 04/15/21 0606 04/16/21 0501  NA 133* 135 136 139 139 133*  K 3.8 3.9 4.1 3.2* 3.3* 3.2*  CL 99 103 101 104 104 99  CO2 26 26 26 28 31 26   GLUCOSE 111* 124* 114* 134* 149* 148*  BUN 25* 18 16 17 15 17   CREATININE 0.66 0.61 0.61 0.42* 0.41* 0.44  CALCIUM 8.0* 8.1* 8.4* 7.8* 7.7* 7.8*  MG 2.3 2.2 2.2 2.1 2.1 2.4  PHOS 4.1 3.6  --  2.8  --  3.1    GFR: Estimated Creatinine Clearance: 48.1 mL/min (by C-G formula based on SCr of 0.44 mg/dL). Liver Function Tests: Recent Labs  Lab 04/15/21 0606  AST 16  ALT 13  ALKPHOS 63  BILITOT 0.5  PROT 5.0*  ALBUMIN 2.0*    No results for input(s): LIPASE, AMYLASE in the last 168 hours. No results for input(s): AMMONIA in the last 168 hours. Coagulation Profile: Recent Labs  Lab 04/13/21 0432 04/14/21 0557 04/15/21 0606 04/15/21 1417 04/16/21 0501  INR 3.6* 3.8* 3.5* 1.8* 1.4*    Cardiac Enzymes: No results for input(s): CKTOTAL, CKMB, CKMBINDEX, TROPONINI in the last 168 hours. BNP (last 3 results) No results for input(s): PROBNP in the last 8760 hours. HbA1C: No results for input(s): HGBA1C in the last 72 hours. CBG: Recent Labs  Lab 04/14/21 0758 04/15/21 0539 04/15/21 2152 04/16/21 0532 04/16/21 0832  GLUCAP 130* 142* 134* 146* 137*    Lipid Profile: Recent Labs    04/15/21 0606  TRIG 93    Thyroid Function Tests: No results for input(s): TSH, T4TOTAL, FREET4, T3FREE, THYROIDAB in the last 72 hours. Anemia Panel: No results for input(s): VITAMINB12, FOLATE, FERRITIN, TIBC, IRON, RETICCTPCT in the last 72 hours. Sepsis Labs: No results for input(s): PROCALCITON, LATICACIDVEN in the last 168 hours.   No results found for this or any previous visit (from the past 240 hour(s)).    Radiology Studies: DG Chest Port 1 View  Result Date: 04/14/2021 CLINICAL DATA:  Check PICC line placement EXAM: PORTABLE CHEST 1 VIEW COMPARISON:  04/08/2021 FINDINGS: Left-sided PICC  line is noted extending into the mid superior vena cava. Aortic calcifications are seen. Heart is enlarged in size but stable. Persistent chronic hiatal hernia is noted on the right with evidence of compressive atelectasis. The overall appearance is similar to that seen on the prior exam. No findings to suggest pneumothorax are seen. No new focal infiltrate is noted. IMPRESSION: Stable appearance of the chest when compared with the prior study. Left-sided PICC line is noted in the mid superior vena cava. This is approximately 3-4 cm short of the cavoatrial junction but in usable position. Electronically Signed   By: Inez Catalina M.D.   On: 04/14/2021 16:25      Scheduled Meds:  sodium chloride   Intravenous Once   amLODipine  5 mg Oral Daily   Chlorhexidine Gluconate Cloth  6 each Topical Daily   feeding supplement  237 mL Oral TID BM   insulin aspart  0-9 Units Subcutaneous Q6H   irbesartan  300 mg Oral Daily   levothyroxine  112 mcg Oral QAC breakfast   multivitamin with minerals  1 tablet Oral Daily   pantoprazole (PROTONIX) IV  40 mg Intravenous Daily   sertraline  50 mg Oral QHS   sodium chloride flush  10-40 mL Intracatheter Q12H   thiamine injection  100 mg Intravenous Daily   Warfarin - Pharmacist Dosing Inpatient   Does not apply q1600   Continuous Infusions:  sodium chloride 0 mL/hr at 04/13/21 0625   dextrose 5% lactated ringers with KCl 20 mEq/L 25 mL/hr at 04/16/21 1017   heparin 1,500 Units/hr (04/16/21 1017)   potassium chloride 10 mEq (04/16/21 1047)   TPN ADULT (ION) 50 mL/hr at 04/16/21 1017   TPN ADULT (ION)       LOS: 15 days      Time spent: 30 minutes   Dessa Phi, DO [facility] 04/16/2021, 10:48 AM   Available via Epic secure chat 7am-7pm After these hours, please refer to coverage provider listed on [email or web address]

## 2021-04-16 NOTE — Progress Notes (Signed)
[facility] for warfarin with heparin bridge Indication: atrial fibrillation  Patient Measurements: Height: 5\' 3"  (160 cm) Weight: 75.2 kg (165 lb 12.6 oz) IBW/kg (Calculated) : 52.4  Vital Signs: Temp: 98.1 F (36.7 C) (09/21 1950) Temp Source: Oral (09/21 1950) BP: 140/68 (09/21 1950) Pulse Rate: 93 (09/21 1950)  Labs: Recent Labs    04/14/21 0842 04/15/21 0606 04/15/21 1417 04/16/21 0501  HGB 8.1* 8.2*  --  8.2*  HCT 26.3* 26.5*  --  27.0*  PLT 515* 498*  --  503*  LABPROT  --  35.2* 20.4* 16.9*  INR  --  3.5* 1.8* 1.4*  CREATININE 0.42* 0.41*  --  0.44     Estimated Creatinine Clearance: 48.1 mL/min (by C-G formula based on SCr of 0.44 mg/dL).   Assessment: 87 y.o. female with medical history significant of hypertension, stroke, TIA, GERD, hypothyroidism, depression with anxiety, hard of hearing, hiatal hernia, atrial fibrillation on Coumadin, breast cancer, bladder cancer, anemia, who presents with abdominal pain and constipation. Pharmacy has been consulted for heparin bridge and warfarin.    PTA warfarin regimen: 3 mg on Sat/Sun/Mon/Tue; 4 mg on Wed/Thur/Fri   Goal of Therapy:  INR 2-3 Heparin level 0.3 - 0.7 Monitor platelets by anticoagulation protocol: Yes  Date   INR   warfarin Dose  9/14    1.4   5 mg 9/15    1.6   5 mg 9/16    2.4   2 mg 9/17    2.9   HOLD  9/18    3.3   1 mg  9/19    3.6   HOLD   9/20    3.8   HOLD  9/21    3.5   HOLD / 5 mg IV vit K 9/22    1.4   4 mg     Plan:   warfarin INR remains supratherapeutic Restart warfarin at 4 mg today Follow up INR with morning labs.  heparin Give 3500 units bolus x 1 Start heparin infusion at 1500 units/hr (this rate was determined using previous heparin levels during this admission) Check anti-Xa level in 8 hours and daily while on heparin Continue to monitor H&H and platelets  Dallie Piles, PharmD, BCPS Clinical Pharmacist 04/16/2021 7:14  AM

## 2021-04-16 NOTE — Progress Notes (Signed)
[facility] for warfarin with heparin bridge Indication: atrial fibrillation  Patient Measurements: Height: 5\' 3"  (160 cm) Weight: 75.2 kg (165 lb 12.6 oz) IBW/kg (Calculated) : 52.4  Heparin Dosing Weight: 68.4 kg   Vital Signs: Temp: 98.2 F (36.8 C) (09/22 0741) Temp Source: Oral (09/22 0741) BP: 135/65 (09/22 0741) Pulse Rate: 85 (09/22 0741)  Labs: Recent Labs    04/14/21 0842 04/15/21 0606 04/15/21 1417 04/16/21 0501 04/16/21 1711  HGB 8.1* 8.2*  --  8.2*  --   HCT 26.3* 26.5*  --  27.0*  --   PLT 515* 498*  --  503*  --   LABPROT  --  35.2* 20.4* 16.9*  --   INR  --  3.5* 1.8* 1.4*  --   HEPARINUNFRC  --   --   --   --  0.29*  CREATININE 0.42* 0.41*  --  0.44  --      Estimated Creatinine Clearance: 48.1 mL/min (by C-G formula based on SCr of 0.44 mg/dL).   Assessment: 87 y.o. female with medical history significant of hypertension, stroke, TIA, GERD, hypothyroidism, depression with anxiety, hard of hearing, hiatal hernia, atrial fibrillation on Coumadin, breast cancer, bladder cancer, anemia, who presents with abdominal pain and constipation. Pharmacy has been consulted for heparin bridge and warfarin.    PTA warfarin regimen: 3 mg on Sat/Sun/Mon/Tue; 4 mg on Wed/Thur/Fri   Date Time HL Rate/Comment  9/22 1711 0.29 Subtherapeutic, 1500 > 1650 units/hr  Goal of Therapy:  INR 2-3 Heparin level 0.3 - 0.7 Monitor platelets by anticoagulation protocol: Yes  Date   INR   warfarin Dose  9/14    1.4   5 mg 9/15    1.6   5 mg 9/16    2.4   2 mg 9/17    2.9   HOLD  9/18    3.3   1 mg  9/19    3.6   HOLD   9/20    3.8   HOLD  9/21    3.5   HOLD / 5 mg IV vit K 9/22    1.4   4 mg     Plan:   warfarin INR remains supratherapeutic Restart warfarin at 4 mg today Follow up INR with morning labs.  Heparin Heparin slightly subtherapeutic Give 1000 units bolus x 1 Increase heparin infusion to 1650 units/hr Check  anti-Xa level 8 hours following rate change and daily while on heparin Continue to monitor H&H and platelets  Dorothe Pea, PharmD, BCPS Clinical Pharmacist 04/16/2021 5:43 PM

## 2021-04-16 NOTE — Progress Notes (Signed)
Tumor Board Documentation  JINI HORIUCHI was presented by Dr Luther Bradley at our Tumor Board on 04/16/2021, which included representatives from medical oncology, surgical, pharmacy, pulmonology, radiology, nutrition, pathology, radiation oncology, navigation, research, internal medicine, palliative care.  [place] currently presents as an external consult, for [place], for new positive pathology with history of the following treatments: surgical intervention(s).  Additionally, we reviewed previous medical and familial history, history of present illness, and recent lab results along with all available histopathologic and imaging studies. The tumor board considered available treatment options and made the following recommendations:   Refer to Medical Oncology post hospital discharge  The following procedures/referrals were also placed: No orders of the defined types were placed in this encounter.   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  Tumor board is a meeting of clinicians from various specialty areas who evaluate and discuss patients for whom a multidisciplinary approach is being considered. Final determinations in the plan of care are those of the provider(s). The responsibility for follow up of recommendations given during tumor board is that of the provider.   Today's extended care, comprehensive team conference, [place] was not present for the discussion and was not examined.   Multidisciplinary Tumor Board is a multidisciplinary case peer review process.  Decisions discussed in the Multidisciplinary Tumor Board reflect the opinions of the specialists present at the conference without having examined the patient.  Ultimately, treatment and diagnostic decisions rest with the primary provider(s) and the  patient.

## 2021-04-16 NOTE — Progress Notes (Signed)
[facility] Day(s): 15.   Post op day(s): 1 Day Post-Op.   Interval History:  Patient seen and examined No acute events or new complaints overnight.  Patient reports she is feeling better this morning No fever, chills, nausea, emesis  She continues to have a leukocytosis to 23.2K (improved from yesterday) Renal function remains normal; sCr - 0.44; UO - unmeasured Hypokalemia to 3.2; o/w no electrolyte derangements   INR is 1.4 this morning; corrected pre-operatively Ileostomy output with stool in bag Worked with therapies; recommending SNF  Vital signs in last 24 hours: [min-max] current  Temp:  [97.6 F (36.4 C)-98.9 F (37.2 C)] 98.1 F (36.7 C) (09/21 1950) Pulse Rate:  [87-99] 93 (09/21 1950) Resp:  [14-20] 16 (09/21 1950) BP: (130-154)/(53-75) 140/68 (09/21 1950) SpO2:  [90 %-99 %] 97 % (09/21 1950) Weight:  [75.2 kg] 75.2 kg (09/22 0500)     Height: 5\' 3"  (160 cm) Weight: 75.2 kg BMI (Calculated): 29.38   Intake/Output last 2 shifts:  09/21 0701 - 09/22 0700 In: 1076.3 [I.V.:724.3; Blood:322.9; IV Piggyback:29.1] Out: 20 [Blood:20]   Physical Exam:  Constitutional: alert, cooperative and no distress  Respiratory: breathing non-labored at rest  Cardiovascular: regular rate and sinus rhythm  Gastrointestinal:  Soft, she is not peritonitic. Ileostomy in the right abdomen, pink, there is liquid stool in bag, no gas Integumentary: Laparoscopic incisions are CDI with dermabond, no erythema or drainage   Labs:  CBC Latest Ref Rng & Units 04/16/2021 04/15/2021 04/14/2021  WBC 4.0 - 10.5 K/uL 23.2(H) 26.1(H) 19.7(H)  Hemoglobin 12.0 - 15.0 g/dL 8.2(L) 8.2(L) 8.1(L)  Hematocrit 36.0 - 46.0 % 27.0(L) 26.5(L) 26.3(L)  Platelets 150 - 400 K/uL 503(H) 498(H) 515(H)   CMP Latest Ref Rng & Units 04/16/2021 04/15/2021 04/14/2021  Glucose 70 - 99 mg/dL 148(H) 149(H) 134(H)  BUN 8 - 23 mg/dL 17 15 17   Creatinine 0.44 - 1.00 mg/dL 0.44  0.41(L) 0.42(L)  Sodium 135 - 145 mmol/L 133(L) 139 139  Potassium 3.5 - 5.1 mmol/L 3.2(L) 3.3(L) 3.2(L)  Chloride 98 - 111 mmol/L 99 104 104  CO2 22 - 32 mmol/L 26 31 28   Calcium 8.9 - 10.3 mg/dL 7.8(L) 7.7(L) 7.8(L)  Total Protein 6.5 - 8.1 g/dL - 5.0(L) -  Total Bilirubin 0.3 - 1.2 mg/dL - 0.5 -  Alkaline Phos 38 - 126 U/L - 63 -  AST 15 - 41 U/L - 16 -  ALT 0 - 44 U/L - 13 -     Imaging studies: No new pertinent imaging studies   Assessment/Plan:  87 y.o. 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   - Continue CLD; await further bowel function before advancing  - Continue TPN at goal rate  - Will hold off on Abx for now; unsure what we would be treating; she is without fevers  - Monitor abdominal examination; on-going ileostomy function - Appreciate WOC RN for ileostomy teaching              - Monitor leukocytosis; uncertain etiology at this point.   - Monitor abdominal examination; on-going ileostomy function             - Early mobilization encouraged, worked with therapies; recommending SNF             - Further management per primary service; we will follow  All of  the above findings and recommendations were discussed with the patient, patient's family (daughter at bedside), and the medical team, and all of patient's and family's questions were answered to their expressed satisfaction.  -- Edison Simon, PA-C [facility] 04/16/2021, 7:29 AM [phone] M-F: 7am - 4pm

## 2021-04-16 NOTE — Progress Notes (Signed)
OT Cancellation Note  Patient Details Name: Gabriela [name] MRN: [id number] DOB: Dec 11, 1931   Cancelled Treatment:    Reason Eval/Treat Not Completed: Fatigue/lethargy limiting ability to participate. Upon attempt, pt/family requesting OT come back later today. Pt just had bath and linens change and just received pain medication. Will re-attempt later this morning/afternoon.  Ardeth Perfect., MPH, MS, OTR/L ascom [phone] 04/16/21, 9:22 AM

## 2021-04-16 NOTE — Progress Notes (Signed)
PHARMACY - TOTAL PARENTERAL NUTRITION CONSULT NOTE   Indication: Inadequate oral intake related to acute illness / obstructing colonic mass   Patient Measurements: Height: 5\' 3"  (160 cm) Weight: 75.2 kg (165 lb 12.6 oz) IBW/kg (Calculated) : 52.4 TPN AdjBW (KG): 56 Body mass index is 29.37 kg/m.  Assessment: 87 y.o. female with medical history significant of hypertension, stroke, TIA, GERD, hypothyroidism, depression with anxiety, hard of hearing, hiatal hernia, atrial fibrillation on Coumadin, breast cancer, bladder cancer, Afib and anemia who presents with abdominal pain and constipation and was found to have an obstructing colon mass.  Glucose / Insulin: on no insulin PTA --BG 134 - 148 (2 units SSI required) Electrolytes: hypokalemia Renal: SCr<1, stable Hepatic: LFTs wnl, TG 93 at baseline Intake / Output; MIVF: stopping MIVF today GI Imaging: No new pertinent imaging studies GI Surgeries / Procedures: 1 Day Post-Op s/p ileostomy revision and 10  days s/p robotic assisted laparoscopic subtotal colectomy with end ileostomy  Central access: 04/14/21  TPN start date: 04/14/21   Nutritional Goals: Goal TPN rate is 75 mL/hr (provides 90 g of protein and 1800 kcals per day)  RD Assessment: Estimated Needs Total Energy Estimated Needs: 1600-1800kcal/day Total Protein Estimated Needs: 80-90g/day Total Fluid Estimated Needs: 1.3-1.5L/day  Current Nutrition:  NPO  Plan:  Advance TPN to 75 mL/hr at 1800 (total volume including overfill: 1900 mL) Nutritional components: Amino acids (Travasol 15%): 90 grams Dextrose: 264.6 grams Lipids (20% SMOF lipids): 54 grams kCal: 1800 /24 hours Electrolytes in TPN (standard): Na 72mEq/L, K 59mEq/L, Ca 59mEq/L, Mg 24mEq/L, and Phos 52mmol/L. Cl:Ac 1:1 Add standard MVI and trace elements to TPN continue Sensitive q6h SSI and adjust as needed  Stop MIVF at 1800 IV KCl 10 mEq x 2 Monitor TPN labs on Mon/Thurs, daily until stable  Dallie Piles 04/16/2021,7:10 AM

## 2021-04-17 DIAGNOSIS — K56609 Unspecified intestinal obstruction, unspecified as to partial versus complete obstruction: Secondary | ICD-10-CM | POA: Diagnosis not present

## 2021-04-17 LAB — CBC
HCT: 25.9 % — ABNORMAL LOW (ref 36.0–46.0)
Hemoglobin: 8 g/dL — ABNORMAL LOW (ref 12.0–15.0)
MCH: 26.2 pg (ref 26.0–34.0)
MCHC: 30.9 g/dL (ref 30.0–36.0)
MCV: 84.9 fL (ref 80.0–100.0)
Platelets: 440 10*3/uL — ABNORMAL HIGH (ref 150–400)
RBC: 3.05 MIL/uL — ABNORMAL LOW (ref 3.87–5.11)
RDW: 15.1 % (ref 11.5–15.5)
WBC: 22.7 10*3/uL — ABNORMAL HIGH (ref 4.0–10.5)
nRBC: 0 % (ref 0.0–0.2)

## 2021-04-17 LAB — GLUCOSE, CAPILLARY
Glucose-Capillary: 118 mg/dL — ABNORMAL HIGH (ref 70–99)
Glucose-Capillary: 144 mg/dL — ABNORMAL HIGH (ref 70–99)
Glucose-Capillary: 154 mg/dL — ABNORMAL HIGH (ref 70–99)
Glucose-Capillary: 157 mg/dL — ABNORMAL HIGH (ref 70–99)
Glucose-Capillary: 165 mg/dL — ABNORMAL HIGH (ref 70–99)

## 2021-04-17 LAB — HEPARIN LEVEL (UNFRACTIONATED)
Heparin Unfractionated: 1.02 IU/mL — ABNORMAL HIGH (ref 0.30–0.70)
Heparin Unfractionated: 0.29 IU/mL — ABNORMAL LOW (ref 0.30–0.70)
Heparin Unfractionated: 0.44 IU/mL (ref 0.30–0.70)

## 2021-04-17 LAB — PROTIME-INR
INR: 1.4 — ABNORMAL HIGH (ref 0.8–1.2)
Prothrombin Time: 17 seconds — ABNORMAL HIGH (ref 11.4–15.2)

## 2021-04-17 LAB — RENAL FUNCTION PANEL
Albumin: 1.7 g/dL — ABNORMAL LOW (ref 3.5–5.0)
Anion gap: 6 (ref 5–15)
BUN: 19 mg/dL (ref 8–23)
CO2: 27 mmol/L (ref 22–32)
Calcium: 7.6 mg/dL — ABNORMAL LOW (ref 8.9–10.3)
Chloride: 102 mmol/L (ref 98–111)
Creatinine, Ser: 0.41 mg/dL — ABNORMAL LOW (ref 0.44–1.00)
GFR, Estimated: >60 mL/min (ref >60)
Glucose, Bld: 157 mg/dL — ABNORMAL HIGH (ref 70–99)
Phosphorus: 3 mg/dL (ref 2.5–4.6)
Potassium: 3.4 mmol/L — ABNORMAL LOW (ref 3.5–5.1)
Sodium: 135 mmol/L (ref 135–145)

## 2021-04-17 LAB — MAGNESIUM: Magnesium: 2.3 mg/dL (ref 1.7–2.4)

## 2021-04-17 MED ORDER — POTASSIUM CHLORIDE CRYS ER 20 MEQ PO TBCR
40.0000 meq | EXTENDED_RELEASE_TABLET | Freq: Once | ORAL | Status: AC
  Administered 2021-04-17: 40 meq via ORAL
  Filled 2021-04-17: qty 2

## 2021-04-17 MED ORDER — NYSTATIN 100000 UNIT/ML MT SUSP
5.0000 mL | Freq: Four times a day (QID) | OROMUCOSAL | Status: DC
  Administered 2021-04-17 – 2021-04-22 (×20): 500000 [IU] via ORAL
  Filled 2021-04-17 (×19): qty 5

## 2021-04-17 MED ORDER — ENSURE ENLIVE PO LIQD
237.0000 mL | Freq: Two times a day (BID) | ORAL | Status: DC
  Administered 2021-04-17: 237 mL via ORAL

## 2021-04-17 MED ORDER — TRACE MINERALS CU-MN-SE-ZN 300-55-60-3000 MCG/ML IV SOLN
INTRAVENOUS | Status: AC
  Filled 2021-04-17: qty 600

## 2021-04-17 MED ORDER — WARFARIN SODIUM 4 MG PO TABS
4.0000 mg | ORAL_TABLET | Freq: Once | ORAL | Status: AC
  Administered 2021-04-17: 4 mg via ORAL
  Filled 2021-04-17: qty 1

## 2021-04-17 NOTE — Progress Notes (Signed)
[place] for warfarin with heparin bridge Indication: atrial fibrillation  Patient Measurements: Height: 5\' 3"  (160 cm) Weight: 75.2 kg (165 lb 12.6 oz) IBW/kg (Calculated) : 52.4  Heparin Dosing Weight: 68.4 kg   Vital Signs: Temp: 98.5 F (36.9 C) (09/22 1934) Temp Source: Oral (09/22 1934) BP: 146/61 (09/22 1934) Pulse Rate: 84 (09/22 1934)  Labs: Recent Labs    04/15/21 0606 04/15/21 1417 04/16/21 0501 04/16/21 1711 04/17/21 0255  HGB 8.2*  --  8.2*  --  8.0*  HCT 26.5*  --  27.0*  --  25.9*  PLT 498*  --  503*  --  440*  LABPROT 35.2* 20.4* 16.9*  --  17.0*  INR 3.5* 1.8* 1.4*  --  1.4*  HEPARINUNFRC  --   --   --  0.29* 1.02*  CREATININE 0.41*  --  0.44  --  0.41*     Estimated Creatinine Clearance: 48.1 mL/min (A) (by C-G formula based on SCr of 0.41 mg/dL (L)).   Assessment: 87 y.o. female with medical history significant of hypertension, stroke, TIA, GERD, hypothyroidism, depression with anxiety, hard of hearing, hiatal hernia, atrial fibrillation on Coumadin, breast cancer, bladder cancer, anemia, who presents with abdominal pain and constipation. Pharmacy has been consulted for heparin bridge and warfarin.    PTA warfarin regimen: 3 mg on Sat/Sun/Mon/Tue; 4 mg on Wed/Thur/Fri   Date Time HL Rate/Comment  9/22 1711 0.29 Subtherapeutic, 1500 > 1650 units/hr 9/23 0255 1.02 Supratherapeutic  Goal of Therapy:  INR 2-3 Heparin level 0.3 - 0.7 Monitor platelets by anticoagulation protocol: Yes  Date   INR   warfarin Dose  9/14    1.4   5 mg 9/15    1.6   5 mg 9/16    2.4   2 mg 9/17    2.9   HOLD  9/18    3.3   1 mg  9/19    3.6   HOLD   9/20    3.8   HOLD  9/21    3.5   HOLD / 5 mg IV vit K 9/22    1.4   4 mg     Plan:   warfarin INR remains supratherapeutic Restart warfarin at 4 mg today Follow up INR with morning labs.  Heparin Heparin supratherapeutic Decrease heparin infusion back to 1500  units/hr Recheck HL 8 hours following rate change and daily while on heparin Continue to monitor H&H and platelets  Renda Rolls, PharmD, [facility] 04/17/2021 3:53 AM

## 2021-04-17 NOTE — Progress Notes (Signed)
Gabriela [name] for warfarin with heparin bridge Indication: atrial fibrillation  Patient Measurements: Height: 5\' 3"  (160 cm) Weight: 75.2 kg (165 lb 12.6 oz) IBW/kg (Calculated) : 52.4  Heparin Dosing Weight: 68.4 kg   Vital Signs: Temp: 98.6 F (37 C) (09/23 1937) Temp Source: Oral (09/23 1937) BP: 141/58 (09/23 1937) Pulse Rate: 95 (09/23 1937)  Labs: Recent Labs    04/15/21 0606 04/15/21 1417 04/16/21 0501 04/16/21 1711 04/17/21 0255 04/17/21 1255 04/17/21 2238  HGB 8.2*  --  8.2*  --  8.0*  --   --   HCT 26.5*  --  27.0*  --  25.9*  --   --   PLT 498*  --  503*  --  440*  --   --   LABPROT 35.2* 20.4* 16.9*  --  17.0*  --   --   INR 3.5* 1.8* 1.4*  --  1.4*  --   --   HEPARINUNFRC  --   --   --    < > 1.02* 0.29* 0.44  CREATININE 0.41*  --  0.44  --  0.41*  --   --    < > = values in this interval not displayed.     Estimated Creatinine Clearance: 48.1 mL/min (A) (by C-G formula based on SCr of 0.41 mg/dL (L)).   Assessment: 87 y.o. female with medical history significant of hypertension, stroke, TIA, GERD, hypothyroidism, depression with anxiety, hard of hearing, hiatal hernia, atrial fibrillation on Coumadin, breast cancer, bladder cancer, anemia, who presents with abdominal pain and constipation. Pharmacy has been consulted for heparin bridge and warfarin.    PTA warfarin regimen: 3 mg on Sat/Sun/Mon/Tue; 4 mg on Wed/Thur/Fri   Heparin level tripled following protocol guided increase, but returned to subtherapeutic when given the original dose (1500 units/hr). H&H are noted to be trending down, continue to follow.  Date Time HL Rate/Comment  9/22 1711 0.29 Subtherapeutic, 1500 > 1650 units/hr 9/23 0255 1.02 Supratherapeutic, 1650 > 1500 units/hr 9/23 1255 0.29 Subtherapeutic 9/23 2238 0.44 Therapeutic x 1  Goal of Therapy:  INR 2-3 Heparin level 0.3 - 0.7 Monitor platelets by anticoagulation protocol: Yes     Date   INR   Warfarin Dose  9/14    1.4   5 mg 9/15    1.6   5 mg 9/16    2.4   2 mg 9/17    2.9   HOLD  9/18    3.3   1 mg  9/19    3.6   HOLD   9/20    3.8   HOLD  9/21    3.5   HOLD / 5 mg IV vit K 9/22    1.4   4 mg 9/23    1.4   4 mg     Plan:   Heparin Continue heparin infusion at 1600 units/hr Recheck HL with AM labs to confirm Continue to monitor H&H and platelets  Warfarin INR remains subtherapeutic Continue Warfarin 4 mg today Plan to stop Heparin after 2 consecutive therapeutic INRs Follow up INR with morning labs.  Renda Rolls, PharmD, [facility] 04/17/2021 11:47 PM

## 2021-04-17 NOTE — Progress Notes (Signed)
[place] for warfarin with heparin bridge Indication: atrial fibrillation  Patient Measurements: Height: 5\' 3"  (160 cm) Weight: 75.2 kg (165 lb 12.6 oz) IBW/kg (Calculated) : 52.4  Heparin Dosing Weight: 68.4 kg   Vital Signs: Temp: 97.4 F (36.3 C) (09/23 0735) Temp Source: Oral (09/23 0735) BP: 151/63 (09/23 0735) Pulse Rate: 88 (09/23 0735)  Labs: Recent Labs    04/15/21 0606 04/15/21 1417 04/16/21 0501 04/16/21 1711 04/17/21 0255  HGB 8.2*  --  8.2*  --  8.0*  HCT 26.5*  --  27.0*  --  25.9*  PLT 498*  --  503*  --  440*  LABPROT 35.2* 20.4* 16.9*  --  17.0*  INR 3.5* 1.8* 1.4*  --  1.4*  HEPARINUNFRC  --   --   --  0.29* 1.02*  CREATININE 0.41*  --  0.44  --  0.41*     Estimated Creatinine Clearance: 48.1 mL/min (A) (by C-G formula based on SCr of 0.41 mg/dL (L)).   Assessment: 87 y.o. female with medical history significant of hypertension, stroke, TIA, GERD, hypothyroidism, depression with anxiety, hard of hearing, hiatal hernia, atrial fibrillation on Coumadin, breast cancer, bladder cancer, anemia, who presents with abdominal pain and constipation. Pharmacy has been consulted for heparin bridge and warfarin.    PTA warfarin regimen: 3 mg on Sat/Sun/Mon/Tue; 4 mg on Wed/Thur/Fri   Heparin level tripled following protocol guided increase, but returned to subtherapeutic when given the original dose (1500 units/hr). H&H are noted to be trending down, continue to follow.  Date Time HL Rate/Comment  9/22 1711 0.29 Subtherapeutic, 1500 > 1650 units/hr 9/23 0255 1.02 Supratherapeutic, 1650 > 1500 units/hr 9/23 1255 0.29 Subtherapeutic  Goal of Therapy:  INR 2-3 Heparin level 0.3 - 0.7 Monitor platelets by anticoagulation protocol: Yes    Date   INR   Warfarin Dose  9/14    1.4   5 mg 9/15    1.6   5 mg 9/16    2.4   2 mg 9/17    2.9   HOLD  9/18    3.3   1 mg  9/19    3.6   HOLD    9/20    3.8   HOLD  9/21    3.5   HOLD / 5 mg IV vit K 9/22    1.4   4 mg 9/23    1.4   4 mg     Plan:   Heparin Heparin level subtherapeutic Increase heparin infusion to 1600 units/hr Recheck HL 8 hours following rate change and daily while on heparin Continue to monitor H&H and platelets  Warfarin INR remains subtherapeutic Continue Warfarin 4 mg today Plan to stop Heparin after 2 consecutive therapeutic INRs Follow up INR with morning labs.  Raven Student 04/17/2021 11:41 AM

## 2021-04-17 NOTE — Evaluation (Signed)
Physical Therapy Re-Evaluation Patient Details Name: Gabriela [name] MRN: [id number] DOB: 02-02-1932 Today's Date: 04/17/2021  History of Present Illness  Pt. is a 87 y.o. female with medical history significant of hypertension, stroke, TIA, GERD, hypothyroidism, depression with anxiety, hard of hearing, hiatal hernia, atrial fibrillation on Coumadin, breast cancer, bladder cancer, anemia, who presents with abdominal pain and constipation. 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   Clinical Impression  Pt was pleasant and motivated to participate during the session and put forth good effort throughout. Pt required physical assistance and cuing during log roll training but was able to come to standing from an elevated EOB and take several steps to the chair with no LOB or physical assist. Pt reported 7/10 abdominal pain at onset of session with no increased pain with activity and with no other adverse symptoms.  Pt's SpO2 remained 92-93% throughout the session on room air, nsg notified.  Pt will benefit from PT services in a SNF setting upon discharge to safely address deficits listed in patient problem list for decreased caregiver assistance and eventual return to PLOF.         Recommendations for follow up therapy are one component of a multi-disciplinary discharge planning process, led by the attending physician.  Recommendations may be updated based on patient status, additional functional criteria and insurance authorization.  Follow Up Recommendations SNF;Supervision/Assistance - 24 hour    Equipment Recommendations  None recommended by PT    Recommendations for Other Services       Precautions / Restrictions Precautions Precautions: Fall Precaution Comments: colostomy bag Restrictions Weight Bearing Restrictions: No      Mobility  Bed Mobility Overal bed  mobility: Needs Assistance Bed Mobility: Rolling;Sidelying to Sit Rolling: Min assist Sidelying to sit: Min assist;Mod assist Supine to sit: Mod assist;HOB elevated Sit to supine: Mod assist   General bed mobility comments: Log roll training with min A for rolling, Mod A for BLEs off of the EOB, and then Min A for trunk to upright position    Transfers Overall transfer level: Needs assistance Equipment used: Rolling walker (2 wheeled) Transfers: Sit to/from Stand Sit to Stand: Min guard;From elevated surface         General transfer comment: Min verbal cues for sequencing most notably for hand placement  Ambulation/Gait Ambulation/Gait assistance: Min guard Gait Distance (Feet): 3 Feet Assistive device: Rolling walker (2 wheeled) Gait Pattern/deviations: Step-through pattern;Decreased step length - right;Decreased step length - left Gait velocity: decreased   General Gait Details: Slow cadence with short B step length but steady without LOB  Stairs            Wheelchair Mobility    Modified Rankin (Stroke Patients Only)       Balance Overall balance assessment: Needs assistance Sitting-balance support: Bilateral upper extremity supported;Feet supported Sitting balance-Leahy Scale: Good Sitting balance - Comments: without UE support tends to lean posteriorly Postural control: Posterior lean Standing balance support: Bilateral upper extremity supported;During functional activity Standing balance-Leahy Scale: Fair Standing balance comment: Min to mod lean on the RW for support                             Pertinent Vitals/Pain Pain Assessment: 0-10 Pain Score: 7  Faces Pain Scale: Hurts even more Pain Location: Abdomem Pain Descriptors / Indicators: Sore Pain Intervention(s): Premedicated before  session;Monitored during session;Repositioned    Home Living Family/patient expects to be discharged to:: Private residence Living Arrangements:  Alone Available Help at Discharge: Family;Available PRN/intermittently Type of Home: House Home Access: Ramped entrance     Home Layout: One level Home Equipment: Walker - 2 wheels;Cane - single point Additional Comments: Pt reports possible plan to d/c with daughter's home (2-3 steps to enter with rails).    Prior Function Level of Independence: Independent         Comments: Pt reports independence with all ADLs and IADLs and did not ambulate with any AD prior to admission     Hand Dominance   Dominant Hand: Right    Extremity/Trunk Assessment   Upper Extremity Assessment Upper Extremity Assessment: Generalized weakness    Lower Extremity Assessment Lower Extremity Assessment: Generalized weakness       Communication   Communication: HOH  Cognition Arousal/Alertness: Awake/alert Behavior During Therapy: WFL for tasks assessed/performed Overall Cognitive Status: Within Functional Limits for tasks assessed                                        General Comments      Exercises Total Joint Exercises Ankle Circles/Pumps: AROM;Strengthening;Both;10 reps Quad Sets: Strengthening;Both;10 reps Heel Slides: AAROM;Strengthening;Both;5 reps Hip ABduction/ADduction: AAROM;Both;5 reps;AROM Straight Leg Raises: AROM;AAROM;Both;5 reps Long Arc Quad: AROM;Strengthening;Both;10 reps Bridges: Strengthening;Both;5 reps (Unable to clear surface of bed) Other Exercises Other Exercises: Log roll training with multiple rolls left/right and sidelying to/from sit Other Exercises:   Assessment/Plan    PT Assessment Patient needs continued PT services  PT Problem List Decreased strength;Decreased activity tolerance;Decreased knowledge of use of DME;Decreased balance;Decreased mobility       PT Treatment Interventions DME instruction;Gait training;Functional mobility training;Therapeutic activities;Stair training;Therapeutic exercise;Balance  training;Patient/family education    PT Goals (Current goals can be found in the Care Plan section)  Acute Rehab PT Goals Patient Stated Goal: To get stronger PT Goal Formulation: With patient Time For Goal Achievement: 04/30/21 Potential to Achieve Goals: Good    Frequency Min 2X/week   Barriers to discharge Inaccessible home environment;Decreased caregiver support      Co-evaluation               AM-PAC PT "6 Clicks" Mobility  Outcome Measure Help needed turning from your back to your side while in a flat bed without using bedrails?: A Little Help needed moving from lying on your back to sitting on the side of a flat bed without using bedrails?: A Lot Help needed moving to and from a bed to a chair (including a wheelchair)?: A Lot Help needed standing up from a chair using your arms (e.g., wheelchair or bedside chair)?: A Little Help needed to walk in hospital room?: A Lot Help needed climbing 3-5 steps with a railing? : Total 6 Click Score: 13    End of Session Equipment Utilized During Treatment: Gait belt Activity Tolerance: Patient tolerated treatment well Patient left: in chair;with call bell/phone within reach;with chair alarm set;with SCD's reapplied Nurse Communication: Mobility status;Other (comment) (Pt found and left on room air with SpO2 92-93% throughout the session) PT Visit Diagnosis: Unsteadiness on feet (R26.81);Muscle weakness (generalized) (M62.81);History of falling (Z91.81);Difficulty in walking, not elsewhere classified (R26.2);Pain Pain - part of body:  (abdomen)    Time: 8502-7741 PT Time Calculation (min) (ACUTE ONLY): 32 min   Charges:   PT Evaluation $PT  Re-evaluation: 1 Re-eval PT Treatments $Therapeutic Activity: 8-22 mins        D. Royetta Asal PT, DPT 04/17/21, 4:13 PM

## 2021-04-17 NOTE — Plan of Care (Signed)

## 2021-04-17 NOTE — Progress Notes (Signed)
PROGRESS NOTE    Gabriela [name]  QQI:[id number] DOB: 02/24/1932 DOA: 04/01/2021 PCP: [name], MD     Brief Narrative:  Gabriela [name] is a 87 y.o. female with medical history significant for hypertension, TIA, stroke, CAD, hypothyroidism, depression, anxiety, hearing impairment, hiatal hernia, atrial fibrillation on Coumadin, breast cancer, bladder cancer, chronic anemia.  She presented to the hospital with abdominal pain and constipation.  She was found to have large bowel obstruction secondary to colonic mass concerning for malignancy.  She was treated with IV fluids and analgesics.  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 She required TPN postoperatively.  She underwent ileostomy revision on 04/15/2021.  New events last 24 hours / Subjective: Patient was advanced to full liquid diet today.  Still has no appetite.  Feeling about the same without any new complaints today.  Assessment & Plan:   Principal Problem:   Large bowel obstruction (HCC) Active Problems:   Abdominal pain   Hypertension   Hypothyroidism   Stroke ([place])   Hypokalemia   Depression with anxiety   Atrial fibrillation, chronic (HCC)   Normocytic anemia   Large bowel obstruction, invasive moderately differentiated adenocarcinoma, ileus -S/p robotic assisted subtotal colectomy with end ileostomy on 04/06/2021 -S/p ileostomy revision 04/15/2021 -Patient plans to follow up with outpatient oncology, voiced she was not interested in any chemotherapy even if offered  -TPN, advanced to full liquid diet today -General surgery following  Acute hypoxemic respiratory failure -Wean O2 as able   Hypokalemia -Replace, trend    Chronic atrial fibrillation and history of stroke -CHA2DS2-VASc score is 6. -Coumadin per pharmacy, IV heparin to bridge  Hypertension -Continue Norvasc, Avapro  Hypothyroidism -Continue Synthroid  Depression/anxiety -Continue Zoloft,  Xanax as needed  Thrush -Nystatin x 7 days    DVT prophylaxis:  SCDs Start: 04/01/21 1207  Code Status:     Code Status Orders  (From admission, onward)           Start     Ordered   04/06/21 2319  Do not attempt resuscitation (DNR)  Continuous       Question Answer Comment  In the event of cardiac or respiratory ARREST Do not call a "code blue"   In the event of cardiac or respiratory ARREST Do not perform Intubation, CPR, defibrillation or ACLS   In the event of cardiac or respiratory ARREST Use medication by any route, position, wound care, and other measures to relive pain and suffering. May use oxygen, suction and manual treatment of airway obstruction as needed for comfort.      04/06/21 2318           Code Status History     Date Active Date Inactive Code Status Order ID Comments User Context   04/01/2021 1321 04/06/2021 2318 DNR [id number]  Ivor Costa, MD ED   04/01/2021 1207 04/01/2021 1321 Full Code [id number]  Ivor Costa, MD ED      Family Communication: Son at bedside Disposition Plan:  Status is: Inpatient  Remains inpatient appropriate because:IV treatments appropriate due to intensity of illness or inability to take PO and Inpatient level of care appropriate due to severity of illness  Dispo: The patient is from: Home              Anticipated d/c is to: SNF              Patient currently is not medically stable to d/c.  Remains on TPN,  full liquid diet   Difficult to place patient No      Consultants:  General surgery   Procedures:  Robotic assisted subtotal colectomy with end ileostomy on 04/06/2021 Ileostomy revision 04/15/2021  Antimicrobials:  Anti-infectives (From admission, onward)    Start     Dose/Rate Route Frequency Ordered Stop   04/15/21 1622  sodium chloride 0.9 % with cefoTEtan (CEFOTAN) ADS Med       Note to Pharmacy: Norton Blizzard  : cabinet override      04/15/21 1622 04/15/21 1625   04/06/21 1320  sodium chloride 0.9 %  with cefoTEtan (CEFOTAN) ADS Med       Note to Pharmacy: Maryagnes Amos   : cabinet override      04/06/21 1320 04/06/21 1457   04/06/21 0830  cefoTEtan (CEFOTAN) 2 g in sodium chloride 0.9 % 100 mL IVPB        2 g 200 mL/hr over 30 Minutes Intravenous On call to O.R. 04/06/21 0740 04/06/21 1452        Objective: Vitals:   04/16/21 1934 04/17/21 0434 04/17/21 0434 04/17/21 0735  BP: (!) 146/61 136/63 136/63 (!) 151/63  Pulse: 84 84 87 88  Resp: 16 16 16    Temp: 98.5 F (36.9 C) 98.4 F (36.9 C) 98.4 F (36.9 C) (!) 97.4 F (36.3 C)  TempSrc: Oral Oral Oral Oral  SpO2: 98% 98% 98% 96%  Weight:      Height:        Intake/Output Summary (Last 24 hours) at 04/17/2021 1013 Last data filed at 04/17/2021 0646 Gross per 24 hour  Intake 4282.93 ml  Output 925 ml  Net 3357.93 ml    Filed Weights   04/13/21 0205 04/14/21 0500 04/16/21 0500  Weight: 71.9 kg 75.6 kg 75.2 kg   Examination: General exam: Appears calm and comfortable  Respiratory system: Clear to auscultation. Respiratory effort normal. Cardiovascular system: S1 & S2 heard, irregular rhythm. No pedal edema. Gastrointestinal system: Abdomen is mildly distended, soft, nontender to palpation + ileostomy present some liquid in bag Central nervous system: Alert and oriented. Non focal exam. Speech clear  Extremities: Symmetric in appearance bilaterally  Skin: No rashes, lesions or ulcers on exposed skin  Psychiatry: Judgement and insight appear stable. Mood & affect appropriate.    Data Reviewed: I have personally reviewed following labs and imaging studies  CBC: Recent Labs  Lab 04/11/21 0435 04/12/21 0434 04/13/21 0432 04/14/21 0842 04/15/21 0606 04/16/21 0501 04/17/21 0255  WBC 23.1* 24.3* 25.3* 19.7* 26.1* 23.2* 22.7*  NEUTROABS 20.3* 21.0* 21.9*  --  23.1*  --   --   HGB 8.3* 8.4* 9.0* 8.1* 8.2* 8.2* 8.0*  HCT 26.1* 26.7* 28.5* 26.3* 26.5* 27.0* 25.9*  MCV 83.1 81.9 81.9 81.4 83.1 83.6 84.9  PLT  314 355 458* 515* 498* 503* 440*    Basic Metabolic Panel: Recent Labs  Lab 04/11/21 0435 04/12/21 0434 04/14/21 0842 04/15/21 0606 04/16/21 0501 04/17/21 0255  NA 135 136 139 139 133* 135  K 3.9 4.1 3.2* 3.3* 3.2* 3.4*  CL 103 101 104 104 99 102  CO2 26 26 28 31 26 27   GLUCOSE 124* 114* 134* 149* 148* 157*  BUN 18 16 17 15 17 19   CREATININE 0.61 0.61 0.42* 0.41* 0.44 0.41*  CALCIUM 8.1* 8.4* 7.8* 7.7* 7.8* 7.6*  MG 2.2 2.2 2.1 2.1 2.4 2.3  PHOS 3.6  --  2.8  --  3.1 3.0    GFR: Estimated Creatinine  Clearance: 48.1 mL/min (A) (by C-G formula based on SCr of 0.41 mg/dL (L)). Liver Function Tests: Recent Labs  Lab 04/15/21 0606 04/17/21 0255  AST 16  --   ALT 13  --   ALKPHOS 63  --   BILITOT 0.5  --   PROT 5.0*  --   ALBUMIN 2.0* 1.7*    No results for input(s): LIPASE, AMYLASE in the last 168 hours. No results for input(s): AMMONIA in the last 168 hours. Coagulation Profile: Recent Labs  Lab 04/14/21 0557 04/15/21 0606 04/15/21 1417 04/16/21 0501 04/17/21 0255  INR 3.8* 3.5* 1.8* 1.4* 1.4*    Cardiac Enzymes: No results for input(s): CKTOTAL, CKMB, CKMBINDEX, TROPONINI in the last 168 hours. BNP (last 3 results) No results for input(s): PROBNP in the last 8760 hours. HbA1C: No results for input(s): HGBA1C in the last 72 hours. CBG: Recent Labs  Lab 04/16/21 0832 04/16/21 1445 04/16/21 1802 04/17/21 0033 04/17/21 0600  GLUCAP 137* 135* 142* 165* 144*    Lipid Profile: Recent Labs    04/15/21 0606  TRIG 93    Thyroid Function Tests: No results for input(s): TSH, T4TOTAL, FREET4, T3FREE, THYROIDAB in the last 72 hours. Anemia Panel: No results for input(s): VITAMINB12, FOLATE, FERRITIN, TIBC, IRON, RETICCTPCT in the last 72 hours. Sepsis Labs: No results for input(s): PROCALCITON, LATICACIDVEN in the last 168 hours.   No results found for this or any previous visit (from the past 240 hour(s)).    Radiology Studies: No results  found.    Scheduled Meds:  sodium chloride   Intravenous Once   amLODipine  5 mg Oral Daily   Chlorhexidine Gluconate Cloth  6 each Topical Daily   feeding supplement  237 mL Oral TID BM   insulin aspart  0-9 Units Subcutaneous Q6H   irbesartan  300 mg Oral Daily   levothyroxine  112 mcg Oral QAC breakfast   multivitamin with minerals  1 tablet Oral Daily   nystatin  5 mL Oral QID   pantoprazole (PROTONIX) IV  40 mg Intravenous Daily   sertraline  50 mg Oral QHS   sodium chloride flush  10-40 mL Intracatheter Q12H   Warfarin - Pharmacist Dosing Inpatient   Does not apply q1600   Continuous Infusions:  sodium chloride 1,000 mL (04/17/21 0622)   heparin 1,500 Units/hr (04/17/21 0646)   TPN ADULT (ION) 75 mL/hr at 04/17/21 0646     LOS: 16 days      Time spent: 20 minutes   Gabriela Phi, DO [facility] 04/17/2021, 10:13 AM   Available via Epic secure chat 7am-7pm After these hours, please refer to coverage provider listed on [email or web address]

## 2021-04-17 NOTE — Progress Notes (Addendum)
Nutrition Follow Up Note   DOCUMENTATION CODES:   Not applicable  INTERVENTION:   Continue TPN per pharmacy  Ensure Enlive po TID, each supplement provides 350 kcal and 20 grams of protein  NUTRITION DIAGNOSIS:   Inadequate oral intake related to acute illness as evidenced by other (comment) (pt on clear liquid diet).  GOAL:   Patient will meet greater than or equal to 90% of their needs -met with TPN  MONITOR:   PO intake, Supplement acceptance, Diet advancement, Labs, Weight trends, Skin, I & O's, TPN  ASSESSMENT:   87 y.o. female with medical history significant of hypertension, stroke, TIA, GERD, hypothyroidism, depression with anxiety, hard of hearing, hiatal hernia, atrial fibrillation on Coumadin, breast cancer, bladder cancer, Afib and anemia who presents with abdominal pain and constipation and was found to have obstructing colon mass. Pt found to have adenocarcinoma.  Pt s/p subtotal colectomy with end ileostomy 9/12 Pt s/p Ileostomy revision 9/21  Pt tolerating TPN well at goal rate. Pt advanced to full liquid diet today. Ensure ordered. Per chart, pt is up ~15lbs since admit; pt +14.3L on her I & Os. Pt is having ileostomy function. Plan is for possible SNF at discharge.   Medications reviewed and include: insulin, synthroid, MVI, protonix, warfarin, heparin   Labs reviewed: K 3.4(L), creat 0.41(L), P 3.0 wnl, Mg 2.3 wnl Triglycerides 93- 9/21 Wbc- 22.7(H), Hgb 8.0(L), Hct 25.9(L) Cbgs- 165, 144 x 24 hrs  Diet Order:   Diet Order             Diet full liquid Room service appropriate? Yes; Fluid consistency: Thin  Diet effective now                  EDUCATION NEEDS:   Education needs have been addressed  Skin:  Skin Assessment: Reviewed RN Assessment  Last BM:  9/23- 54m via ostomy  Height:   Ht Readings from Last 1 Encounters:  04/06/21 _0  (1.6 m)    Weight:   Wt Readings from Last 1 Encounters:  04/16/21 75.2 kg    Ideal Body  Weight:  52.3 kg  BMI:  Body mass index is 29.37 kg/m.  Estimated Nutritional Needs:   Kcal:  1600-1800kcal/day  Protein:  80-90g/day  Fluid:  1.3-1.5L/day  CKoleen DistanceMS, RD, LDN Please refer to [facility] and/or RD on-call/weekend/after hours pager

## 2021-04-17 NOTE — TOC Progression Note (Signed)
Transition of Care [facility]) - Progression Note    Patient Details  Name: Gabriela [name] MRN: [id number] Date of Birth: 1931/10/11  Transition of Care [employer] [facility]) CM/SW Contact  Beverly Sessions, RN Phone Number: 04/17/2021, 2:16 PM  Clinical Narrative:     Patient still on full rate TPN.  Per MD will not be ready for discharge over the weekend.  When TPN is able to be weaned TOC to reached out SNFS to determine who can still offer a bed.  Will need insurance auth prior to discharge    Expected Discharge Plan: [place] Barriers to Discharge: Continued Medical Work up  Expected Discharge Plan and Services Expected Discharge Plan: [place] Choice: [place] arrangements for the past 2 months: Single Family Home                                       Social Determinants of Health (SDOH) Interventions    Readmission Risk Interventions No flowsheet data found.

## 2021-04-17 NOTE — Progress Notes (Addendum)
[facility] [facility] Hospital Day(s): [place] op day(s): 2 Days Post-Op.   Interval History:  Patient seen and examined No acute events or new complaints overnight.  Patient reports she is feeling okay  No fever, chills, nausea, emesis  She continues to have a leukocytosis to 22.7K (improved from yesterday) Renal function remains normal; sCr - 0.41; UO - unmeasured Hypokalemia to 3.4; o/w no electrolyte derangements   INR is 1.4 this morning; corrected pre-operatively Ileostomy output mostly minimal liquid stool; 75 ccs Worked with therapies; recommending SNF  Vital signs in last 24 hours: [min-max] current  Temp:  [97.4 F (36.3 C)-98.5 F (36.9 C)] 97.4 F (36.3 C) (09/23 0735) Pulse Rate:  [84-90] 88 (09/23 0735) Resp:  [16] 16 (09/23 0434) BP: (136-151)/(61-73) 151/63 (09/23 0735) SpO2:  [96 %-98 %] 96 % (09/23 0735)     Height: 5\' 3"  (160 cm) Weight: 75.2 kg BMI (Calculated): 29.38   Intake/Output last 2 shifts:  09/22 0701 - 09/23 0700 In: 4282.9 [P.O.:1160; I.V.:2922.9; IV Piggyback:200] Out: 925 [Urine:850; Stool:75]   Physical Exam:  Constitutional: alert, cooperative and no distress  Respiratory: breathing non-labored at rest  Cardiovascular: regular rate and sinus rhythm  Gastrointestinal:  Soft, non-tender, mild distension (unchanged), she is not peritonitic. Ileostomy in the right abdomen, pink, there is some liquid stool in bag, no gas. Digital exam, confirms the recurrence of edema to the stoma.  Integumentary: Laparoscopic incisions are CDI with dermabond, no erythema or drainage   Labs:  CBC Latest Ref Rng & Units 04/17/2021 04/16/2021 04/15/2021  WBC 4.0 - 10.5 K/uL 22.7(H) 23.2(H) 26.1(H)  Hemoglobin 12.0 - 15.0 g/dL 8.0(L) 8.2(L) 8.2(L)  Hematocrit 36.0 - 46.0 % 25.9(L) 27.0(L) 26.5(L)  Platelets 150 - 400 K/uL 440(H) 503(H) 498(H)   CMP Latest Ref Rng & Units 04/17/2021 04/16/2021 04/15/2021  Glucose 70 - 99 mg/dL  157(H) 148(H) 149(H)  BUN 8 - 23 mg/dL 19 17 15   Creatinine 0.44 - 1.00 mg/dL 0.41(L) 0.44 0.41(L)  Sodium 135 - 145 mmol/L 135 133(L) 139  Potassium 3.5 - 5.1 mmol/L 3.4(L) 3.2(L) 3.3(L)  Chloride 98 - 111 mmol/L 102 99 104  CO2 22 - 32 mmol/L 27 26 31   Calcium 8.9 - 10.3 mg/dL 7.6(L) 7.8(L) 7.7(L)  Total Protein 6.5 - 8.1 g/dL - - 5.0(L)  Total Bilirubin 0.3 - 1.2 mg/dL - - 0.5  Alkaline Phos 38 - 126 U/L - - 63  AST 15 - 41 U/L - - 16  ALT 0 - 44 U/L - - 13     Imaging studies: No new pertinent imaging studies   Assessment/Plan:  87 y.o. female 2 Days Post-Op s/p ileostomy revision s/p robotic assisted laparoscopic subtotal colectomy with end ileostomy for obstructing transverse colon mass on 09/12.   - Will trial on FLD to help further liberalize her diet; encouraged to take it slow  - Continue TPN at goal rate; would not begin to wean until certain she is tolerating soft/regular diet  - Will hold off on Abx for now; unsure what we would be treating; she is without fevers  - Monitor abdominal examination; on-going ileostomy function, will improve with nutrition.  - Appreciate WOC RN for ileostomy teaching              - Monitor leukocytosis; uncertain etiology at this point.   - Monitor abdominal examination; on-going ileostomy function             -  Early mobilization encouraged, worked with therapies; recommending SNF             - Further management per primary service; we will follow  - Reviewed pathology & tumor board: T2N0 Adenocarcinoma; refer to oncology as outpatient   All of the above findings and recommendations were discussed with the patient, patient's family, and the medical team, and all of patient's and family's questions were answered to their expressed satisfaction.  -- Edison Simon, PA-C [facility] 04/17/2021, 7:47 AM [phone] M-F: 7am - 4pm  Ronny Bacon, M.D., [facility]  04/17/2021 ; 8:52 AM

## 2021-04-17 NOTE — Progress Notes (Signed)
PHARMACY - TOTAL PARENTERAL NUTRITION CONSULT NOTE   Indication: Inadequate oral intake related to acute illness / obstructing colonic mass   Patient Measurements: Height: 5\' 3"  (160 cm) Weight: 75.2 kg (165 lb 12.6 oz) IBW/kg (Calculated) : 52.4 TPN AdjBW (KG): 56 Body mass index is 29.37 kg/m.  Assessment: 87 y.o. female with medical history significant of hypertension, stroke, TIA, GERD, hypothyroidism, depression with anxiety, hard of hearing, hiatal hernia, atrial fibrillation on Coumadin, breast cancer, bladder cancer, Afib and anemia who presents with abdominal pain and constipation and was found to have an obstructing colon mass.  Glucose / Insulin: on no insulin PTA --BG 148 - 157 (5 units SSI required) Electrolytes: hypokalemia Renal: SCr<1, stable Hepatic: LFTs wnl, TG 93 at baseline Intake / Output; MIVF: no MIVF   GI Imaging: No new pertinent imaging studies GI Surgeries / Procedures: 2 Days Post-Op s/p ileostomy revision and 11  days s/p robotic assisted laparoscopic subtotal colectomy with end ileostomy  Central access: 04/14/21  TPN start date: 04/14/21   Nutritional Goals: Goal TPN rate is 75 mL/hr (provides 90 g of protein and 1800 kcals per day)  RD Assessment: Estimated Needs Total Energy Estimated Needs: 1600-1800kcal/day Total Protein Estimated Needs: 80-90g/day Total Fluid Estimated Needs: 1.3-1.5L/day  Current Nutrition:  NPO  Plan:  continue TPN at 75 mL/hr (total volume including overfill: 1900 mL) Nutritional components: Amino acids (Travasol 15%): 90 grams Dextrose: 264.6 grams Lipids (20% SMOF lipids): 54 grams kCal: 1800 /24 hours Electrolytes in TPN (standard): Na 73mEq/L, K 59mEq/L, Ca 59mEq/L, Mg 19mEq/L, and Phos 3mmol/L. Cl:Ac 1:1 Add standard MVI and trace elements to TPN continue Sensitive q6h SSI and adjust as needed  po KCl 40 mEq x 1 Monitor TPN labs on Mon/Thurs, daily until stable  Dallie Piles 04/17/2021,7:08 AM

## 2021-04-17 NOTE — Evaluation (Signed)
Occupational Therapy Re-Evaluation Patient Details Name: Gabriela [name] MRN: [id number] DOB: [birth year] Today's Date: 04/17/2021   History of Present Illness Pt. is a 87 y.o. female with medical history significant of hypertension, stroke, TIA, GERD, hypothyroidism, depression with anxiety, hard of hearing, hiatal hernia, atrial fibrillation on Coumadin, breast cancer, bladder cancer, anemia, who presents with abdominal pain and constipation. 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   Clinical Impression   Pt seen for re-evaluation and OT tx this date following ileostomy revision on 04/15/21 with new OT order received. Pt in bed, agreeable to therapy. Family present. Pt endorses "medium" amount of abdominal pain, after demonstrating difficulty with the 10-point pain scale. Pt donned R hearing aide with set up from family member. Nasal cannula removed for the process. On room air, pt able to maintain SpO2 >93%, even with exertional activity. Left on room air at end of session and RN notified.   Pt required Mod A for bed mobility, requiring increased effort, time, and assist needed as compared to prior sessions. Upon initial standing attempt, pt required MAX A with heavy verbal cues for hand/foot placement and RW mgt with heavy posterior lean noted. Pt unable to thread briefs over her feet 2/2 abdominal pain with bending, requiring MAX A to thread over feet to her knees. Pt experienced an episode of urinary incontinence upon standing before able to completely don briefs requiring her to return to sitting EOB. Once floor cleaned up and new socks a brief donned, pt stood again with MAX A + VC for RW. Mobility tech entered session and provided MAX A +2 to complete donning of brief+pad over hips as pt required BUE support on RW throughout 2/2 decreased strength, balance, and activity tolerance. Pt instructed in side steps along  edge of the bed requiring MOD A +2 to complete with consistent cues for RW mgt and foot placement, as she had tendency to take steps forward instead of to the side. Once returned to supine with MOD A for BLE mgt, MAX A for LB bathing and gown change. Pt left with all items in reach, bed alarm on, and family present. SCDs reapplied. RN notified of mobility, abdominal pain, SpO2 on room air, and pt's request for full bath and linen change later with nurse tech.   Pt has demonstrated a functional decline since previous evaluation and treatment sessions prior to ileostomy revision. Pt now requires increased assist for both ADL mobility and LB ADL tasks. OT goals reviewed and updated to reflect pt's function. Continue to recommend SNF for short term rehab upon discharge once medically stable.       Recommendations for follow up therapy are one component of a multi-disciplinary discharge planning process, led by the attending physician.  Recommendations may be updated based on patient status, additional functional criteria and insurance authorization.   Follow Up Recommendations  SNF    Equipment Recommendations  3 in 1 bedside commode    Recommendations for Other Services       Precautions / Restrictions Precautions Precautions: Fall Precaution Comments: colostomy bag Restrictions Weight Bearing Restrictions: No      Mobility Bed Mobility Overal bed mobility: Needs Assistance Bed Mobility: Supine to Sit;Sit to Supine     Supine to sit: Mod assist;HOB elevated Sit to supine: Mod assist   General bed mobility comments: VC for use of bed rail, tendency to lean posteriorly requiring assist for trunk support  Transfers Overall transfer level: Needs assistance Equipment used: Rolling walker (2 wheeled) Transfers: Sit to/from Stand Sit to Stand: Max assist;From elevated surface         General transfer comment: initial trial requiring MAX A x1 briefly. Pt had uninary incontinence  episode to returned to sitting. After clean up, Pt completed with MAX A again, cues for hand placement. MOD A +2 assist for lateral steps + RW EOB with max verbal cues for sequencing of B feet and RW, as pt had tendency to take a step forward versus to the side.    Balance Overall balance assessment: Needs assistance Sitting-balance support: Single extremity supported;Feet supported Sitting balance-Leahy Scale: Fair Sitting balance - Comments: without UE support tends to lean posteriorly Postural control: Posterior lean Standing balance support: Bilateral upper extremity supported;During functional activity Standing balance-Leahy Scale: Zero Standing balance comment: Pt requires heavy assist to maintain balance with 4 small side steps                           ADL either performed or assessed with clinical judgement   ADL Overall ADL's : Needs assistance/impaired             Lower Body Bathing: Bed level;Maximal assistance Lower Body Bathing Details (indicate cue type and reason): after return to bed, required MAX A for washing BLE and peri hygiene     Lower Body Dressing: Sit to/from stand;Maximal assistance;+2 for physical assistance Lower Body Dressing Details (indicate cue type and reason): MAX A to thread mesh undergarments over feet, MAX A +2 to pull up over hips in standing                     Vision         Perception     Praxis      Pertinent Vitals/Pain Pain Assessment: Faces (pt demo's difficulty with 10pt scale, endorses "medium" amount of pain in abdomen that worsens with standing) Faces Pain Scale: Hurts even more Pain Location: pt demo's difficulty with 10pt scale, endorses "medium" amount of pain in abdomen that worsens with standing Pain Descriptors / Indicators: Grimacing;Guarding Pain Intervention(s): Limited activity within patient's tolerance;Monitored during session;Repositioned;Patient requesting pain meds-RN notified     Hand  Dominance Right   Extremity/Trunk Assessment Upper Extremity Assessment Upper Extremity Assessment: Generalized weakness   Lower Extremity Assessment Lower Extremity Assessment: Generalized weakness       Communication Communication Communication: HOH (hears better on R side, has hearing aide)   Cognition Arousal/Alertness: Awake/alert Behavior During Therapy: WFL for tasks assessed/performed Overall Cognitive Status: Within Functional Limits for tasks assessed                                     General Comments       Exercises Other Exercises Other Exercises: Pt tolerated >101min of activity (combination of bed mobility, sitting EOB, standing trials, and side steps) all on room air with SpO2 remaining above 93% throughout. RN notified, left on room air.   Shoulder Instructions      Home Living Family/patient expects to be discharged to:: Private residence Living Arrangements: Alone Available Help at Discharge: Family;Available PRN/intermittently Type of Home: House Home Access: Ramped entrance     Home Layout: One level         Bathroom Toilet: Standard     Home Equipment: Environmental consultant -  2 wheels;Cane - single point   Additional Comments: Pt reports possible plan to d/c with daughter's home (2-3 steps to enter with rails).      Prior Functioning/Environment Level of Independence: Independent        Comments: Pt reports independence with all ADLs and IADLs and did not ambulate with any AD prior to admission        OT Problem List: Pain;Decreased strength;Decreased activity tolerance;Impaired balance (sitting and/or standing);Decreased knowledge of use of DME or AE      OT Treatment/Interventions: Self-care/ADL training;Therapeutic exercise;DME and/or AE instruction;Patient/family education;Therapeutic activities;Balance training    OT Goals(Current goals can be found in the care plan section) Acute Rehab OT Goals Patient Stated Goal: to return  home OT Goal Formulation: With patient/family Time For Goal Achievement: 05/01/21 Potential to Achieve Goals: Good ADL Goals Pt Will Perform Grooming: with set-up;sitting;with supervision Pt Will Perform Upper Body Dressing: with supervision;with set-up;sitting Pt Will Perform Lower Body Dressing: with min assist;with mod assist;sit to/from stand Pt Will Transfer to Toilet: with mod assist;bedside commode;stand pivot transfer (LRAD)  OT Frequency: Min 2X/week   Barriers to D/C:            Co-evaluation              AM-PAC OT "6 Clicks" Daily Activity     Outcome Measure Help from another person eating meals?: None Help from another person taking care of personal grooming?: A Little Help from another person toileting, which includes using toliet, bedpan, or urinal?: A Lot Help from another person bathing (including washing, rinsing, drying)?: A Lot Help from another person to put on and taking off regular upper body clothing?: A Little Help from another person to put on and taking off regular lower body clothing?: A Lot 6 Click Score: 16   End of Session Equipment Utilized During Treatment: Gait belt;Rolling walker Nurse Communication: Patient requests pain meds;Mobility status;Other (comment) (on room air)  Activity Tolerance: Patient tolerated treatment well Patient left: in bed;with call bell/phone within reach;with bed alarm set;with SCD's reapplied;with family/visitor present  OT Visit Diagnosis: Unsteadiness on feet (R26.81);Muscle weakness (generalized) (M62.81)                Time: 1245-8099 OT Time Calculation (min): 57 min Charges:  OT General Charges $OT Visit: 1 Visit OT Evaluation $OT Re-eval: 1 Re-eval OT Treatments $Self Care/Home Management : 38-52 mins  Ardeth Perfect., MPH, MS, OTR/L ascom [phone] 04/17/21, 2:39 PM

## 2021-04-18 DIAGNOSIS — K56609 Unspecified intestinal obstruction, unspecified as to partial versus complete obstruction: Secondary | ICD-10-CM | POA: Diagnosis not present

## 2021-04-18 LAB — RENAL FUNCTION PANEL
Albumin: 1.8 g/dL — ABNORMAL LOW (ref 3.5–5.0)
Anion gap: 8 (ref 5–15)
BUN: 25 mg/dL — ABNORMAL HIGH (ref 8–23)
CO2: 29 mmol/L (ref 22–32)
Calcium: 8.2 mg/dL — ABNORMAL LOW (ref 8.9–10.3)
Chloride: 100 mmol/L (ref 98–111)
Creatinine, Ser: 0.39 mg/dL — ABNORMAL LOW (ref 0.44–1.00)
GFR, Estimated: >60 mL/min (ref >60)
Glucose, Bld: 134 mg/dL — ABNORMAL HIGH (ref 70–99)
Phosphorus: 3.8 mg/dL (ref 2.5–4.6)
Potassium: 4.1 mmol/L (ref 3.5–5.1)
Sodium: 137 mmol/L (ref 135–145)

## 2021-04-18 LAB — URINALYSIS, ROUTINE W REFLEX MICROSCOPIC
Bilirubin Urine: NEGATIVE
Glucose, UA: NEGATIVE mg/dL
Hgb urine dipstick: NEGATIVE
Ketones, ur: NEGATIVE mg/dL
Nitrite: NEGATIVE
Protein, ur: NEGATIVE mg/dL
Specific Gravity, Urine: 1.019 (ref 1.005–1.030)
pH: 5 (ref 5.0–8.0)

## 2021-04-18 LAB — GLUCOSE, CAPILLARY
Glucose-Capillary: 123 mg/dL — ABNORMAL HIGH (ref 70–99)
Glucose-Capillary: 134 mg/dL — ABNORMAL HIGH (ref 70–99)
Glucose-Capillary: 135 mg/dL — ABNORMAL HIGH (ref 70–99)
Glucose-Capillary: 137 mg/dL — ABNORMAL HIGH (ref 70–99)
Glucose-Capillary: 142 mg/dL — ABNORMAL HIGH (ref 70–99)

## 2021-04-18 LAB — BASIC METABOLIC PANEL
Anion gap: 8 (ref 5–15)
BUN: 26 mg/dL — ABNORMAL HIGH (ref 8–23)
CO2: 29 mmol/L (ref 22–32)
Calcium: 8.3 mg/dL — ABNORMAL LOW (ref 8.9–10.3)
Chloride: 102 mmol/L (ref 98–111)
Creatinine, Ser: 0.42 mg/dL — ABNORMAL LOW (ref 0.44–1.00)
GFR, Estimated: >60 mL/min (ref >60)
Glucose, Bld: 136 mg/dL — ABNORMAL HIGH (ref 70–99)
Potassium: 4.1 mmol/L (ref 3.5–5.1)
Sodium: 139 mmol/L (ref 135–145)

## 2021-04-18 LAB — CBC
HCT: 25.5 % — ABNORMAL LOW (ref 36.0–46.0)
Hemoglobin: 7.9 g/dL — ABNORMAL LOW (ref 12.0–15.0)
MCH: 26.2 pg (ref 26.0–34.0)
MCHC: 31 g/dL (ref 30.0–36.0)
MCV: 84.4 fL (ref 80.0–100.0)
Platelets: 471 10*3/uL — ABNORMAL HIGH (ref 150–400)
RBC: 3.02 MIL/uL — ABNORMAL LOW (ref 3.87–5.11)
RDW: 15.4 % (ref 11.5–15.5)
WBC: 24.9 10*3/uL — ABNORMAL HIGH (ref 4.0–10.5)
nRBC: 0 % (ref 0.0–0.2)

## 2021-04-18 LAB — MAGNESIUM: Magnesium: 2.4 mg/dL (ref 1.7–2.4)

## 2021-04-18 LAB — PROTIME-INR
INR: 1.4 — ABNORMAL HIGH (ref 0.8–1.2)
Prothrombin Time: 17.2 seconds — ABNORMAL HIGH (ref 11.4–15.2)

## 2021-04-18 LAB — HEPARIN LEVEL (UNFRACTIONATED)
Heparin Unfractionated: 0.25 IU/mL — ABNORMAL LOW (ref 0.30–0.70)
Heparin Unfractionated: 0.24 IU/mL — ABNORMAL LOW (ref 0.30–0.70)

## 2021-04-18 MED ORDER — HEPARIN BOLUS VIA INFUSION
1000.0000 [IU] | Freq: Once | INTRAVENOUS | Status: AC
  Administered 2021-04-18: 1000 [IU] via INTRAVENOUS
  Filled 2021-04-18: qty 1000

## 2021-04-18 MED ORDER — HEPARIN BOLUS VIA INFUSION
1000.0000 [IU] | Freq: Once | INTRAVENOUS | Status: DC
  Filled 2021-04-18: qty 1000

## 2021-04-18 MED ORDER — TRACE MINERALS CU-MN-SE-ZN 300-55-60-3000 MCG/ML IV SOLN
INTRAVENOUS | Status: AC
  Filled 2021-04-18: qty 600

## 2021-04-18 MED ORDER — WARFARIN SODIUM 5 MG PO TABS
5.0000 mg | ORAL_TABLET | Freq: Once | ORAL | Status: AC
  Administered 2021-04-18: 5 mg via ORAL
  Filled 2021-04-18: qty 1

## 2021-04-18 NOTE — Progress Notes (Signed)
[place] for warfarin with heparin bridge Indication: atrial fibrillation  Patient Measurements: Height: 5\' 3"  (160 cm) Weight: 77.5 kg (170 lb 13.7 oz) IBW/kg (Calculated) : 52.4  Heparin Dosing Weight: 68.4 kg   Vital Signs: Temp: 98.2 F (36.8 C) (09/24 1518) Temp Source: Oral (09/24 0756) BP: 152/55 (09/24 1518) Pulse Rate: 81 (09/24 1518)  Labs: Recent Labs    04/16/21 0501 04/16/21 1711 04/17/21 0255 04/17/21 1255 04/17/21 2238 04/18/21 0525 04/18/21 1515  HGB 8.2*  --  8.0*  --   --  7.9*  --   HCT 27.0*  --  25.9*  --   --  25.5*  --   PLT 503*  --  440*  --   --  471*  --   LABPROT 16.9*  --  17.0*  --   --  17.2*  --   INR 1.4*  --  1.4*  --   --  1.4*  --   HEPARINUNFRC  --    < > 1.02*   < > 0.44 0.25* 0.24*  CREATININE 0.44  --  0.41*  --   --  0.39*  0.42*  --    < > = values in this interval not displayed.     Estimated Creatinine Clearance: 48.8 mL/min (A) (by C-G formula based on SCr of 0.42 mg/dL (L)).   Assessment: 87 y.o. female with medical history significant of hypertension, stroke, TIA, GERD, hypothyroidism, depression with anxiety, hard of hearing, hiatal hernia, atrial fibrillation on Coumadin, breast cancer, bladder cancer, anemia, who presents with abdominal pain and constipation.   Pharmacy has been consulted for heparin bridge and warfarin.    PTA warfarin regimen: 3 mg on Sat/Sun/Mon/Tue; 4 mg on Wed/Thur/Fri   Heparin level tripled following protocol guided increase, but returned to subtherapeutic when given the original dose (1500 units/hr). H&H are noted to be trending down, continue to follow.  Pt with vit K dosed 9/21 in advance of ileostomy revision - warfarin held prior to surgery and resumed afterward while on heparin bridge.  Date Time HL Rate/Comment  9/22 1711 0.29 Subtherapeutic, 1500 > 1650 units/hr 9/23 0255 1.02 Supratherapeutic, 1650 > 1500  units/hr 9/23 1255 0.29 Subtherapeutic 9/23 2238 0.44 Therapeutic x 1 9/24 0525 0.25 Subtherapeutic  9/24 1515 0.24 Subtherapeutic   Goal of Therapy:  INR 2-3 Heparin level 0.3 - 0.7 Monitor platelets by anticoagulation protocol: Yes    Date   INR   Warfarin Dose  9/14    1.4   5 mg 9/15    1.6   5 mg 9/16    2.4   2 mg 9/17    2.9   HOLD  9/18    3.3   1 mg  9/19    3.6   HOLD   9/20    3.8   HOLD  9/21    3.5   HOLD / 5 mg IV vit K 9/22    1.4   4 mg 9/23    1.4   4 mg  9/24   1.4   5 mg    Plan:   Heparin Heparin subtherapeutic  Repeat bolus 1000 units x 1  Increase heparin infusion to 1900 units/hr  Recheck HL 8 hours following rate change  Continue to monitor H&H and platelets  Warfarin INR remains subtherapeutic Warfarin 5 mg today Plan to stop Heparin after 2 consecutive therapeutic INRs Follow up INR with morning labs.  [employer]  Marcia Brash, PharmD, BCPS Clinical Pharmacist 04/18/2021 3:52 PM

## 2021-04-18 NOTE — Progress Notes (Addendum)
PHARMACY - TOTAL PARENTERAL NUTRITION CONSULT NOTE   Indication: Inadequate oral intake related to acute illness / obstructing colonic mass   Patient Measurements: Height: 5\' 3"  (160 cm) Weight: 77.5 kg (170 lb 13.7 oz) IBW/kg (Calculated) : 52.4 TPN AdjBW (KG): 56 Body mass index is 30.27 kg/m.  Assessment: 87 y.o. female with medical history significant of hypertension, stroke, TIA, GERD, hypothyroidism, depression with anxiety, hard of hearing, hiatal hernia, atrial fibrillation on Coumadin, breast cancer, bladder cancer, Afib and anemia who presents with abdominal pain and constipation and was found to have an obstructing colon mass.  Glucose / Insulin: on no insulin PTA --BG 118 - 144 (5 units SSI required) Electrolytes: wnl's, Corrected Ca 10.06 Renal: SCr<1, stable Hepatic: LFTs wnl, TG 93 at baseline Intake / Output; MIVF: no MIVF   GI Imaging: No new pertinent imaging studies GI Surgeries / Procedures: 3 Days Post-Op s/p ileostomy revision and 12  days s/p robotic assisted laparoscopic subtotal colectomy with end ileostomy  Central access: 04/14/21  TPN start date: 04/14/21   Nutritional Goals: Goal TPN rate is 75 mL/hr (provides 90 g of protein and 1800 kcals per day)  RD Assessment: Estimated Needs Total Energy Estimated Needs: 1600-1800kcal/day Total Protein Estimated Needs: 80-90g/day Total Fluid Estimated Needs: 1.3-1.5L/day  Current Nutrition:  NPO  Plan:  continue TPN at 75 mL/hr (total volume including overfill: 1900 mL) Nutritional components: Amino acids (Travasol 15%): 90 grams Dextrose: 264.6 grams Lipids (20% SMOF lipids): 54 grams kCal: 1800 /24 hours Electrolytes in TPN (standard): Na 63mEq/L, K 52mEq/L, Ca 45mEq/L, Mg 82mEq/L, and Phos 75mmol/L. Cl:Ac 1:1 Add standard MVI and trace elements to TPN continue Sensitive q6h SSI and adjust as needed  Monitor TPN labs on Mon/Thurs, daily until stable  Lu Duffel, PharmD, BCPS Clinical  Pharmacist 04/18/2021 8:28 AM

## 2021-04-18 NOTE — Progress Notes (Signed)
[facility] Day(s): [place] op day(s): 3 Days Post-Op from ileostomy revision  Interval History:  Patient seen and examined No acute events or new complaints overnight.  Patient reports she is feeling okay  No fever, chills.  She did report some mild nausea with a clear liquid diet overnight, but is feeling better this morning. Leukocytosis persists Renal function remains normal Hypokalemia to 3.4; o/w no electrolyte derangements   INR is 1.4 yesterday; not checked today Ileostomy output has picked up significantly with over 700 out of liquid stool. Worked with therapies; recommending SNF  Vital signs in last 24 hours: [min-max] current  Temp:  [97.6 F (36.4 C)-98.6 F (37 C)] 97.6 F (36.4 C) (09/24 0756) Pulse Rate:  [79-95] 79 (09/24 0756) Resp:  [16-20] 17 (09/24 0756) BP: (122-144)/(57-98) 122/98 (09/24 0756) SpO2:  [93 %-94 %] 93 % (09/24 0756) Weight:  [77.5 kg] 77.5 kg (09/24 0500)     Height: 5\' 3"  (160 cm) Weight: 77.5 kg BMI (Calculated): 30.27   Intake/Output last 2 shifts:  09/23 0701 - 09/24 0700 In: 1128.1 [P.O.:240; I.V.:888.1] Out: 650 [Urine:400; Stool:250]   Physical Exam:  Constitutional: alert, cooperative and no distress  Respiratory: breathing non-labored at rest  Cardiovascular: regular rate and sinus rhythm  Gastrointestinal:  Soft, non-tender, nondistended, she is not peritonitic. Ileostomy in the right abdomen, pink, there is about 100 cc of brown liquid stool in bag, and some gas Digital exam, confirms the recurrence of edema to the stoma.  Integumentary: Laparoscopic incisions are CDI with dermabond, no erythema or drainage   Labs:  CBC Latest Ref Rng & Units 04/18/2021 04/17/2021 04/16/2021  WBC 4.0 - 10.5 K/uL 24.9(H) 22.7(H) 23.2(H)  Hemoglobin 12.0 - 15.0 g/dL 7.9(L) 8.0(L) 8.2(L)  Hematocrit 36.0 - 46.0 % 25.5(L) 25.9(L) 27.0(L)  Platelets 150 - 400 K/uL 471(H) 440(H) 503(H)   CMP  Latest Ref Rng & Units 04/18/2021 04/18/2021 04/17/2021  Glucose 70 - 99 mg/dL 134(H) 136(H) 157(H)  BUN 8 - 23 mg/dL 25(H) 26(H) 19  Creatinine 0.44 - 1.00 mg/dL 0.39(L) 0.42(L) 0.41(L)  Sodium 135 - 145 mmol/L 137 139 135  Potassium 3.5 - 5.1 mmol/L 4.1 4.1 3.4(L)  Chloride 98 - 111 mmol/L 100 102 102  CO2 22 - 32 mmol/L 29 29 27   Calcium 8.9 - 10.3 mg/dL 8.2(L) 8.3(L) 7.6(L)  Total Protein 6.5 - 8.1 g/dL - - -  Total Bilirubin 0.3 - 1.2 mg/dL - - -  Alkaline Phos 38 - 126 U/L - - -  AST 15 - 41 U/L - - -  ALT 0 - 44 U/L - - -     Imaging studies: No new pertinent imaging studies   Assessment/Plan:  87 y.o. female 3 Days Post-Op s/p ileostomy revision s/p robotic assisted laparoscopic subtotal colectomy with end ileostomy for obstructing transverse colon mass on 09/12.   - Will trial on FLD to help further liberalize her diet; encouraged to take it slow; due to some nausea, we will not advance past this point today.  - Continue TPN at goal rate and adjust electrolytes based on labs; would not begin to wean until certain she is tolerating soft/regular diet  - Will hold off on Abx for now; unsure what we would be treating; she is without fevers  - Monitor abdominal examination; on-going ileostomy function, will improve with nutrition.  - Appreciate WOC RN for ileostomy teaching              -  Monitor leukocytosis; uncertain etiology at this point.   - Monitor abdominal examination; on-going ileostomy function             - Early mobilization encouraged, worked with therapies; recommending SNF             - Further management per primary service; we will follow  - Reviewed pathology & tumor board: T2N0 Adenocarcinoma; refer to oncology as outpatient   All of the above findings and recommendations were discussed with the patient, patient's family, and the medical team, and all of patient's and family's questions were answered to their expressed satisfaction.

## 2021-04-18 NOTE — Plan of Care (Signed)

## 2021-04-18 NOTE — Progress Notes (Signed)
Mobility Specialist - Progress Note   04/18/21 1400  Mobility  Activity Transferred:  Bed to chair  Level of Assistance Minimal assist, patient does 75% or more  Assistive Device Front wheel walker  Distance Ambulated (ft) 5 ft  Mobility Ambulated with assistance in room;Out of bed to chair with meals  Mobility Response Tolerated well  Mobility performed by Mobility specialist  $Mobility charge 1 Mobility    Pt awakened by voice, family at bedside. Pt denied pain but does voice that she's been experiencing nausea. Sat EOB with modA with ability to hip shift forward. Pt stood to RW with maxA +1 and extra time. Does have incontinent episode upon standing. Floors cleaned for safety. Assistance for peri-care. Ambulated to chair with cues for sequencing. Pt stood from recliner with modA and extra time. Pt left in recliner with needs in reach and alarm set.    Kathee Delton Mobility Specialist 04/18/21, 2:37 PM

## 2021-04-18 NOTE — Progress Notes (Signed)
[place] for warfarin with heparin bridge Indication: atrial fibrillation  Patient Measurements: Height: 5\' 3"  (160 cm) Weight: 77.5 kg (170 lb 13.7 oz) IBW/kg (Calculated) : 52.4  Heparin Dosing Weight: 68.4 kg   Vital Signs: Temp: 98.2 F (36.8 C) (09/24 0531) Temp Source: Oral (09/23 1937) BP: 144/57 (09/24 0531) Pulse Rate: 80 (09/24 0531)  Labs: Recent Labs    04/16/21 0501 04/16/21 1711 04/17/21 0255 04/17/21 1255 04/17/21 2238 04/18/21 0525  HGB 8.2*  --  8.0*  --   --  7.9*  HCT 27.0*  --  25.9*  --   --  25.5*  PLT 503*  --  440*  --   --  471*  LABPROT 16.9*  --  17.0*  --   --  17.2*  INR 1.4*  --  1.4*  --   --  1.4*  HEPARINUNFRC  --    < > 1.02* 0.29* 0.44 0.25*  CREATININE 0.44  --  0.41*  --   --  0.39*  0.42*   < > = values in this interval not displayed.     Estimated Creatinine Clearance: 48.8 mL/min (A) (by C-G formula based on SCr of 0.42 mg/dL (L)).   Assessment: 87 y.o. female with medical history significant of hypertension, stroke, TIA, GERD, hypothyroidism, depression with anxiety, hard of hearing, hiatal hernia, atrial fibrillation on Coumadin, breast cancer, bladder cancer, anemia, who presents with abdominal pain and constipation. Pharmacy has been consulted for heparin bridge and warfarin.    PTA warfarin regimen: 3 mg on Sat/Sun/Mon/Tue; 4 mg on Wed/Thur/Fri   Heparin level tripled following protocol guided increase, but returned to subtherapeutic when given the original dose (1500 units/hr). H&H are noted to be trending down, continue to follow.  Date Time HL Rate/Comment  9/22 1711 0.29 Subtherapeutic, 1500 > 1650 units/hr 9/23 0255 1.02 Supratherapeutic, 1650 > 1500 units/hr 9/23 1255 0.29 Subtherapeutic 9/23 2238 0.44 Therapeutic x 1 9/24 0525 0.25 Subtherapeutic   Goal of Therapy:  INR 2-3 Heparin level 0.3 - 0.7 Monitor platelets by anticoagulation protocol: Yes     Date   INR   Warfarin Dose  9/14    1.4   5 mg 9/15    1.6   5 mg 9/16    2.4   2 mg 9/17    2.9   HOLD  9/18    3.3   1 mg  9/19    3.6   HOLD   9/20    3.8   HOLD  9/21    3.5   HOLD / 5 mg IV vit K 9/22    1.4   4 mg 9/23    1.4   4 mg     Plan:   Heparin Bolus 1000 units x 1 Increase heparin infusion to 1750 units/hr Recheck HL in 8 hours after rate change. Continue to monitor H&H and platelets  Warfarin INR remains subtherapeutic Continue Warfarin 4 mg today Plan to stop Heparin after 2 consecutive therapeutic INRs Follow up INR with morning labs.  Renda Rolls, PharmD, [facility] 04/18/2021 6:59 AM

## 2021-04-18 NOTE — Progress Notes (Signed)
PROGRESS NOTE    Gabriela [name]  UVO:[id number] DOB: 1932-04-22 DOA: 04/01/2021 PCP: [name], MD     Brief Narrative:  Gabriela [name] is a 87 y.o. female with medical history significant for hypertension, TIA, stroke, CAD, hypothyroidism, depression, anxiety, hearing impairment, hiatal hernia, atrial fibrillation on Coumadin, breast cancer, bladder cancer, chronic anemia.  She presented to the hospital with abdominal pain and constipation.  She was found to have large bowel obstruction secondary to colonic mass concerning for malignancy.  She was treated with IV fluids and analgesics.  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 She required TPN postoperatively.  She underwent ileostomy revision on 04/15/2021.  New events last 24 hours / Subjective: Patient states she had episodes of nausea and vomiting yesterday. Feeling a little better this morning. Family at bedside concerned about her urinary incontinence which is new. Patient admits to dysuria.   Assessment & Plan:   Principal Problem:   Large bowel obstruction (HCC) Active Problems:   Abdominal pain   Hypertension   Hypothyroidism   Stroke ([place])   Hypokalemia   Depression with anxiety   Atrial fibrillation, chronic (HCC)   Normocytic anemia   Large bowel obstruction, invasive moderately differentiated adenocarcinoma, ileus -S/p robotic assisted subtotal colectomy with end ileostomy on 04/06/2021 -S/p ileostomy revision 04/15/2021 -Patient plans to follow up with outpatient oncology, voiced she was not interested in any chemotherapy even if offered  -Remains on TPN, full liquid diet  -General surgery following  Acute hypoxemic respiratory failure -Resolved and now on room air    Chronic atrial fibrillation and history of stroke -CHA2DS2-VASc score is 6. -Coumadin per pharmacy, IV heparin to bridge  Hypertension -Continue Norvasc, Avapro  Hypothyroidism -Continue  Synthroid  Depression/anxiety -Continue Zoloft, Xanax as needed  Thrush -Nystatin x 7 days  Dysuria and urinary incontinence -Check UA, urine culture     DVT prophylaxis:  SCDs Start: 04/01/21 1207 warfarin (COUMADIN) tablet 5 mg  Code Status:     Code Status Orders  (From admission, onward)           Start     Ordered   04/06/21 2319  Do not attempt resuscitation (DNR)  Continuous       Question Answer Comment  In the event of cardiac or respiratory ARREST Do not call a "code blue"   In the event of cardiac or respiratory ARREST Do not perform Intubation, CPR, defibrillation or ACLS   In the event of cardiac or respiratory ARREST Use medication by any route, position, wound care, and other measures to relive pain and suffering. May use oxygen, suction and manual treatment of airway obstruction as needed for comfort.      04/06/21 2318           Code Status History     Date Active Date Inactive Code Status Order ID Comments User Context   04/01/2021 1321 04/06/2021 2318 DNR [id number]  Ivor Costa, MD ED   04/01/2021 1207 04/01/2021 1321 Full Code [id number]  Ivor Costa, MD ED      Family Communication: Son at bedside Disposition Plan:  Status is: Inpatient  Remains inpatient appropriate because:IV treatments appropriate due to intensity of illness or inability to take PO and Inpatient level of care appropriate due to severity of illness  Dispo: The patient is from: Home              Anticipated d/c is to: SNF  Patient currently is not medically stable to d/c.  Remains on TPN, full liquid diet   Difficult to place patient No      Consultants:  General surgery   Procedures:  Robotic assisted subtotal colectomy with end ileostomy on 04/06/2021 Ileostomy revision 04/15/2021  Antimicrobials:  Anti-infectives (From admission, onward)    Start     Dose/Rate Route Frequency Ordered Stop   04/15/21 1622  sodium chloride 0.9 % with cefoTEtan (CEFOTAN)  ADS Med       Note to Pharmacy: Norton Blizzard  : cabinet override      04/15/21 1622 04/15/21 1625   04/06/21 1320  sodium chloride 0.9 % with cefoTEtan (CEFOTAN) ADS Med       Note to Pharmacy: Maryagnes Amos   : cabinet override      04/06/21 1320 04/06/21 1457   04/06/21 0830  cefoTEtan (CEFOTAN) 2 g in sodium chloride 0.9 % 100 mL IVPB        2 g 200 mL/hr over 30 Minutes Intravenous On call to O.R. 04/06/21 0740 04/06/21 1452        Objective: Vitals:   04/17/21 1937 04/18/21 0500 04/18/21 0531 04/18/21 0756  BP: (!) 141/58  (!) 144/57 (!) 122/98  Pulse: 95  80 79  Resp: 20  16 17   Temp: 98.6 F (37 C)  98.2 F (36.8 C) 97.6 F (36.4 C)  TempSrc: Oral   Oral  SpO2: 93%  94% 93%  Weight:  77.5 kg    Height:        Intake/Output Summary (Last 24 hours) at 04/18/2021 1010 Last data filed at 04/18/2021 0900 Gross per 24 hour  Intake 1128.12 ml  Output 650 ml  Net 478.12 ml    Filed Weights   04/14/21 0500 04/16/21 0500 04/18/21 0500  Weight: 75.6 kg 75.2 kg 77.5 kg     Examination: General exam: Appears calm and comfortable  Respiratory system: Clear to auscultation. Respiratory effort normal. Cardiovascular system: S1 & S2 heard, Irreg rhythm. No pedal edema. Gastrointestinal system: Abdomen is nondistended, soft and nontender. Normal bowel sounds heard. +ileostomy with brown liquid in bag  Central nervous system: Alert and oriented. Non focal exam. Speech clear  Extremities: Symmetric in appearance bilaterally  Skin: No rashes, lesions or ulcers on exposed skin  Psychiatry: Judgement and insight appear stable. Mood & affect appropriate.    Data Reviewed: I have personally reviewed following labs and imaging studies  CBC: Recent Labs  Lab 04/12/21 0434 04/13/21 0432 04/14/21 0842 04/15/21 0606 04/16/21 0501 04/17/21 0255 04/18/21 0525  WBC 24.3* 25.3* 19.7* 26.1* 23.2* 22.7* 24.9*  NEUTROABS 21.0* 21.9*  --  23.1*  --   --   --   HGB 8.4*  9.0* 8.1* 8.2* 8.2* 8.0* 7.9*  HCT 26.7* 28.5* 26.3* 26.5* 27.0* 25.9* 25.5*  MCV 81.9 81.9 81.4 83.1 83.6 84.9 84.4  PLT 355 458* 515* 498* 503* 440* 471*    Basic Metabolic Panel: Recent Labs  Lab 04/14/21 0842 04/15/21 0606 04/16/21 0501 04/17/21 0255 04/18/21 0525  NA 139 139 133* 135 137  139  K 3.2* 3.3* 3.2* 3.4* 4.1  4.1  CL 104 104 99 102 100  102  CO2 28 31 26 27 29  29   GLUCOSE 134* 149* 148* 157* 134*  136*  BUN 17 15 17 19  25*  26*  CREATININE 0.42* 0.41* 0.44 0.41* 0.39*  0.42*  CALCIUM 7.8* 7.7* 7.8* 7.6* 8.2*  8.3*  MG 2.1 2.1  2.4 2.3 2.4  PHOS 2.8  --  3.1 3.0 3.8    GFR: Estimated Creatinine Clearance: 48.8 mL/min (A) (by C-G formula based on SCr of 0.42 mg/dL (L)). Liver Function Tests: Recent Labs  Lab 04/15/21 0606 04/17/21 0255 04/18/21 0525  AST 16  --   --   ALT 13  --   --   ALKPHOS 63  --   --   BILITOT 0.5  --   --   PROT 5.0*  --   --   ALBUMIN 2.0* 1.7* 1.8*    No results for input(s): LIPASE, AMYLASE in the last 168 hours. No results for input(s): AMMONIA in the last 168 hours. Coagulation Profile: Recent Labs  Lab 04/15/21 0606 04/15/21 1417 04/16/21 0501 04/17/21 0255 04/18/21 0525  INR 3.5* 1.8* 1.4* 1.4* 1.4*    Cardiac Enzymes: No results for input(s): CKTOTAL, CKMB, CKMBINDEX, TROPONINI in the last 168 hours. BNP (last 3 results) No results for input(s): PROBNP in the last 8760 hours. HbA1C: No results for input(s): HGBA1C in the last 72 hours. CBG: Recent Labs  Lab 04/17/21 1125 04/17/21 1739 04/17/21 2357 04/18/21 0507 04/18/21 0758  GLUCAP 157* 118* 154* 137* 142*    Lipid Profile: No results for input(s): CHOL, HDL, LDLCALC, TRIG, CHOLHDL, LDLDIRECT in the last 72 hours.  Thyroid Function Tests: No results for input(s): TSH, T4TOTAL, FREET4, T3FREE, THYROIDAB in the last 72 hours. Anemia Panel: No results for input(s): VITAMINB12, FOLATE, FERRITIN, TIBC, IRON, RETICCTPCT in the last 72  hours. Sepsis Labs: No results for input(s): PROCALCITON, LATICACIDVEN in the last 168 hours.   No results found for this or any previous visit (from the past 240 hour(s)).    Radiology Studies: No results found.    Scheduled Meds:  amLODipine  5 mg Oral Daily   Chlorhexidine Gluconate Cloth  6 each Topical Daily   feeding supplement  237 mL Oral TID BM   insulin aspart  0-9 Units Subcutaneous Q6H   irbesartan  300 mg Oral Daily   levothyroxine  112 mcg Oral QAC breakfast   multivitamin with minerals  1 tablet Oral Daily   nystatin  5 mL Oral QID   pantoprazole (PROTONIX) IV  40 mg Intravenous Daily   sertraline  50 mg Oral QHS   sodium chloride flush  10-40 mL Intracatheter Q12H   warfarin  5 mg Oral ONCE-1600   Warfarin - Pharmacist Dosing Inpatient   Does not apply q1600   Continuous Infusions:  sodium chloride 1,000 mL (04/17/21 0622)   heparin 1,750 Units/hr (04/18/21 0758)   TPN ADULT (ION) 75 mL/hr at 04/18/21 0326   TPN ADULT (ION)       LOS: 17 days      Time spent: 20 minutes   Dessa Phi, DO [facility] 04/18/2021, 10:10 AM   Available via Epic secure chat 7am-7pm After these hours, please refer to coverage provider listed on [email or web address]

## 2021-04-18 NOTE — Progress Notes (Signed)
[facility] for warfarin with heparin bridge Indication: atrial fibrillation  Patient Measurements: Height: 5\' 3"  (160 cm) Weight: 77.5 kg (170 lb 13.7 oz) IBW/kg (Calculated) : 52.4  Heparin Dosing Weight: 68.4 kg   Vital Signs: Temp: 97.6 F (36.4 C) (09/24 0756) Temp Source: Oral (09/24 0756) BP: 122/98 (09/24 0756) Pulse Rate: 79 (09/24 0756)  Labs: Recent Labs    04/16/21 0501 04/16/21 1711 04/17/21 0255 04/17/21 1255 04/17/21 2238 04/18/21 0525  HGB 8.2*  --  8.0*  --   --  7.9*  HCT 27.0*  --  25.9*  --   --  25.5*  PLT 503*  --  440*  --   --  471*  LABPROT 16.9*  --  17.0*  --   --  17.2*  INR 1.4*  --  1.4*  --   --  1.4*  HEPARINUNFRC  --    < > 1.02* 0.29* 0.44 0.25*  CREATININE 0.44  --  0.41*  --   --  0.39*  0.42*   < > = values in this interval not displayed.     Estimated Creatinine Clearance: 48.8 mL/min (A) (by C-G formula based on SCr of 0.42 mg/dL (L)).   Assessment: 87 y.o. female with medical history significant of hypertension, stroke, TIA, GERD, hypothyroidism, depression with anxiety, hard of hearing, hiatal hernia, atrial fibrillation on Coumadin, breast cancer, bladder cancer, anemia, who presents with abdominal pain and constipation.   Pharmacy has been consulted for heparin bridge and warfarin.    PTA warfarin regimen: 3 mg on Sat/Sun/Mon/Tue; 4 mg on Wed/Thur/Fri   Heparin level tripled following protocol guided increase, but returned to subtherapeutic when given the original dose (1500 units/hr). H&H are noted to be trending down, continue to follow.  Pt with vit K dosed 9/21 in advance of ileostomy revision - warfarin held prior to surgery and resumed afterward while on heparin bridge.  Date Time HL Rate/Comment  9/22 1711 0.29 Subtherapeutic, 1500 > 1650 units/hr 9/23 0255 1.02 Supratherapeutic, 1650 > 1500 units/hr 9/23 1255 0.29 Subtherapeutic 9/23 2238 0.44 Therapeutic x  1 9/24 0525 0.25 Subtherapeutic   Goal of Therapy:  INR 2-3 Heparin level 0.3 - 0.7 Monitor platelets by anticoagulation protocol: Yes    Date   INR   Warfarin Dose  9/14    1.4   5 mg 9/15    1.6   5 mg 9/16    2.4   2 mg 9/17    2.9   HOLD  9/18    3.3   1 mg  9/19    3.6   HOLD   9/20    3.8   HOLD  9/21    3.5   HOLD / 5 mg IV vit K 9/22    1.4   4 mg 9/23    1.4   4 mg  9/24   1.4   5 mg    Plan:   Heparin Continue heparin infusion @ 1750 units/hr until 3pm level (and adjust per protocol) Continue to monitor H&H and platelets  Warfarin INR remains subtherapeutic Warfarin 5 mg today Plan to stop Heparin after 2 consecutive therapeutic INRs Follow up INR with morning labs.  Lu Duffel, PharmD, BCPS Clinical Pharmacist 04/18/2021 8:24 AM

## 2021-04-19 DIAGNOSIS — K56609 Unspecified intestinal obstruction, unspecified as to partial versus complete obstruction: Secondary | ICD-10-CM | POA: Diagnosis not present

## 2021-04-19 LAB — BASIC METABOLIC PANEL
Anion gap: 6 (ref 5–15)
BUN: 26 mg/dL — ABNORMAL HIGH (ref 8–23)
CO2: 28 mmol/L (ref 22–32)
Calcium: 7.7 mg/dL — ABNORMAL LOW (ref 8.9–10.3)
Chloride: 102 mmol/L (ref 98–111)
Creatinine, Ser: 0.42 mg/dL — ABNORMAL LOW (ref 0.44–1.00)
GFR, Estimated: >60 mL/min (ref >60)
Glucose, Bld: 124 mg/dL — ABNORMAL HIGH (ref 70–99)
Potassium: 4.3 mmol/L (ref 3.5–5.1)
Sodium: 136 mmol/L (ref 135–145)

## 2021-04-19 LAB — GLUCOSE, CAPILLARY
Glucose-Capillary: 110 mg/dL — ABNORMAL HIGH (ref 70–99)
Glucose-Capillary: 115 mg/dL — ABNORMAL HIGH (ref 70–99)
Glucose-Capillary: 119 mg/dL — ABNORMAL HIGH (ref 70–99)
Glucose-Capillary: 128 mg/dL — ABNORMAL HIGH (ref 70–99)
Glucose-Capillary: 131 mg/dL — ABNORMAL HIGH (ref 70–99)

## 2021-04-19 LAB — PROTIME-INR
INR: 1.6 — ABNORMAL HIGH (ref 0.8–1.2)
Prothrombin Time: 19.3 seconds — ABNORMAL HIGH (ref 11.4–15.2)

## 2021-04-19 LAB — CBC
HCT: 22.9 % — ABNORMAL LOW (ref 36.0–46.0)
Hemoglobin: 7.2 g/dL — ABNORMAL LOW (ref 12.0–15.0)
MCH: 26.1 pg (ref 26.0–34.0)
MCHC: 31.4 g/dL (ref 30.0–36.0)
MCV: 83 fL (ref 80.0–100.0)
Platelets: 460 10*3/uL — ABNORMAL HIGH (ref 150–400)
RBC: 2.76 MIL/uL — ABNORMAL LOW (ref 3.87–5.11)
RDW: 15.4 % (ref 11.5–15.5)
WBC: 20.5 10*3/uL — ABNORMAL HIGH (ref 4.0–10.5)
nRBC: 0 % (ref 0.0–0.2)

## 2021-04-19 LAB — HEPARIN LEVEL (UNFRACTIONATED)
Heparin Unfractionated: 0.57 IU/mL (ref 0.30–0.70)
Heparin Unfractionated: 0.27 IU/mL — ABNORMAL LOW (ref 0.30–0.70)
Heparin Unfractionated: 0.77 IU/mL — ABNORMAL HIGH (ref 0.30–0.70)

## 2021-04-19 MED ORDER — HEPARIN (PORCINE) 25000 UT/250ML-% IV SOLN
1950.0000 [IU]/h | INTRAVENOUS | Status: DC
  Administered 2021-04-19 – 2021-04-20 (×2): 1950 [IU]/h via INTRAVENOUS
  Filled 2021-04-19 (×2): qty 250

## 2021-04-19 MED ORDER — TRACE MINERALS CU-MN-SE-ZN 300-55-60-3000 MCG/ML IV SOLN
INTRAVENOUS | Status: AC
  Filled 2021-04-19: qty 600

## 2021-04-19 MED ORDER — TRAMADOL HCL 50 MG PO TABS
50.0000 mg | ORAL_TABLET | Freq: Four times a day (QID) | ORAL | Status: DC | PRN
  Administered 2021-04-19 – 2021-04-20 (×2): 50 mg via ORAL
  Filled 2021-04-19 (×2): qty 1

## 2021-04-19 MED ORDER — OXYBUTYNIN CHLORIDE 5 MG PO TABS: 5.0000 mg | ORAL_TABLET | Freq: Three times a day (TID) | ORAL | Status: DC | PRN

## 2021-04-19 MED ORDER — WARFARIN SODIUM 5 MG PO TABS
5.0000 mg | ORAL_TABLET | Freq: Once | ORAL | Status: AC
  Administered 2021-04-19: 5 mg via ORAL
  Filled 2021-04-19: qty 1

## 2021-04-19 NOTE — Progress Notes (Signed)
PHARMACY - TOTAL PARENTERAL NUTRITION CONSULT NOTE   Indication: Inadequate oral intake related to acute illness / obstructing colonic mass   Patient Measurements: Height: 5\' 3"  (160 cm) Weight: 77.1 kg (169 lb 15.6 oz) IBW/kg (Calculated) : 52.4 TPN AdjBW (KG): 56 Body mass index is 30.11 kg/m.  Assessment: 87 y.o. female with medical history significant of hypertension, stroke, TIA, GERD, hypothyroidism, depression with anxiety, hard of hearing, hiatal hernia, atrial fibrillation on Coumadin, breast cancer, bladder cancer, Afib and anemia who presents with abdominal pain and constipation and was found to have an obstructing colon mass.  Glucose / Insulin: on no insulin PTA --BG 124 - 134 (3 units SSI required) Electrolytes: wnl's, Corrected Ca 9.46 Renal: SCr<1, stable Hepatic: LFTs wnl, TG 93 at baseline Intake / Output; MIVF: no MIVF, +9.8L this admission (net neg 1.3L 9/24), lbm 9/24   GI Imaging: No new pertinent imaging studies GI Surgeries / Procedures: 4 Days Post-Op s/p ileostomy revision and 12  days s/p robotic assisted laparoscopic subtotal colectomy with end ileostomy  Central access: 04/14/21  TPN start date: 04/14/21   Nutritional Goals: Goal TPN rate is 75 mL/hr (provides 90 g of protein and 1800 kcals per day)  RD Assessment: Estimated Needs Total Energy Estimated Needs: 1600-1800kcal/day Total Protein Estimated Needs: 80-90g/day Total Fluid Estimated Needs: 1.3-1.5L/day  Current Nutrition:  Full liq since 9/23  Plan:  Continue TPN at 75 mL/hr (total volume including overfill: 1900 mL) Nutritional components: Amino acids (Travasol 15%): 90 grams Dextrose: 264.6 grams Lipids (20% SMOF lipids): 54 grams kCal: 1800 /24 hours Electrolytes in TPN (standard): Na 58mEq/L, K 97mEq/L, Ca 42mEq/L, Mg 53mEq/L, and Phos 43mmol/L. Cl:Ac 1:1 Add standard MVI and trace elements to TPN continue Sensitive q6h SSI and adjust as needed  Monitor TPN labs on Mon/Thurs,  daily until stable   Lu Duffel, PharmD, BCPS Clinical Pharmacist 04/19/2021 9:25 AM

## 2021-04-19 NOTE — Progress Notes (Signed)
PROGRESS NOTE    Gabriela [name]  XYI:[id number] DOB: 1931-08-30 DOA: 04/01/2021 PCP: [name], MD     Brief Narrative:  Gabriela [name] is a 87 y.o. female with medical history significant for hypertension, TIA, stroke, CAD, hypothyroidism, depression, anxiety, hearing impairment, hiatal hernia, atrial fibrillation on Coumadin, breast cancer, bladder cancer, chronic anemia.  She presented to the hospital with abdominal pain and constipation.  She was found to have large bowel obstruction secondary to colonic mass concerning for malignancy.  She was treated with IV fluids and analgesics.  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 She required TPN postoperatively.  She underwent ileostomy revision on 04/15/2021.  New events last 24 hours / Subjective: Still having episodes of urinary incontinence, specifically when she stands up to work with PT OT.  Otherwise feeling well, trying to work on increasing her oral diet.  Still has no appetite.  Ileostomy output has been 2000 mL recorded last 24 hours  Assessment & Plan:   Principal Problem:   Large bowel obstruction (HCC) Active Problems:   Abdominal pain   Hypertension   Hypothyroidism   Stroke ([place])   Hypokalemia   Depression with anxiety   Atrial fibrillation, chronic (HCC)   Normocytic anemia   Large bowel obstruction, invasive moderately differentiated adenocarcinoma, ileus -S/p robotic assisted subtotal colectomy with end ileostomy on 04/06/2021 -S/p ileostomy revision 04/15/2021 -Patient plans to follow up with outpatient oncology, voiced she was not interested in any chemotherapy even if offered  -Remains on TPN, full liquid diet  -General surgery following  Acute hypoxemic respiratory failure -Resolved and now on room air    Chronic atrial fibrillation and history of stroke -CHA2DS2-VASc score is 6. -Coumadin per pharmacy, IV heparin to bridge  Hypertension -Continue  Norvasc, Avapro  Hypothyroidism -Continue Synthroid  Depression/anxiety -Continue Zoloft, Xanax as needed  Thrush -Nystatin x 7 days  Dysuria and urinary incontinence -UA negative for UTI -Oxybutynin as needed for bladder spasm  DVT prophylaxis:  SCDs Start: 04/01/21 1207  Code Status:     Code Status Orders  (From admission, onward)           Start     Ordered   04/06/21 2319  Do not attempt resuscitation (DNR)  Continuous       Question Answer Comment  In the event of cardiac or respiratory ARREST Do not call a "code blue"   In the event of cardiac or respiratory ARREST Do not perform Intubation, CPR, defibrillation or ACLS   In the event of cardiac or respiratory ARREST Use medication by any route, position, wound care, and other measures to relive pain and suffering. May use oxygen, suction and manual treatment of airway obstruction as needed for comfort.      04/06/21 2318           Code Status History     Date Active Date Inactive Code Status Order ID Comments User Context   04/01/2021 1321 04/06/2021 2318 DNR [id number]  Ivor Costa, MD ED   04/01/2021 1207 04/01/2021 1321 Full Code [id number]  Ivor Costa, MD ED      Family Communication: Son at bedside Disposition Plan:  Status is: Inpatient  Remains inpatient appropriate because:IV treatments appropriate due to intensity of illness or inability to take PO and Inpatient level of care appropriate due to severity of illness  Dispo: The patient is from: Home  Anticipated d/c is to: SNF              Patient currently is not medically stable to d/c.  Remains on TPN, full liquid diet   Difficult to place patient No      Consultants:  General surgery   Procedures:  Robotic assisted subtotal colectomy with end ileostomy on 04/06/2021 Ileostomy revision 04/15/2021  Antimicrobials:  Anti-infectives (From admission, onward)    Start     Dose/Rate Route Frequency Ordered Stop   04/15/21 1622   sodium chloride 0.9 % with cefoTEtan (CEFOTAN) ADS Med       Note to Pharmacy: Norton Blizzard  : cabinet override      04/15/21 1622 04/15/21 1625   04/06/21 1320  sodium chloride 0.9 % with cefoTEtan (CEFOTAN) ADS Med       Note to Pharmacy: Maryagnes Amos   : cabinet override      04/06/21 1320 04/06/21 1457   04/06/21 0830  cefoTEtan (CEFOTAN) 2 g in sodium chloride 0.9 % 100 mL IVPB        2 g 200 mL/hr over 30 Minutes Intravenous On call to O.R. 04/06/21 0740 04/06/21 1452        Objective: Vitals:   04/18/21 1518 04/18/21 1937 04/19/21 0531 04/19/21 0751  BP: (!) 152/55 (!) 140/52 138/62 (!) 130/59  Pulse: 81 80 73 74  Resp: 17 16 18 20   Temp: 98.2 F (36.8 C) 98.1 F (36.7 C) 98.1 F (36.7 C) 98.1 F (36.7 C)  TempSrc:  Oral Oral Oral  SpO2: 96% 94% 97% 97%  Weight:   77.1 kg   Height:        Intake/Output Summary (Last 24 hours) at 04/19/2021 0957 Last data filed at 04/19/2021 0630 Gross per 24 hour  Intake 1315.16 ml  Output 2800 ml  Net -1484.84 ml    Filed Weights   04/16/21 0500 04/18/21 0500 04/19/21 0531  Weight: 75.2 kg 77.5 kg 77.1 kg    Examination: General exam: Appears calm and comfortable  Respiratory system: Clear to auscultation. Respiratory effort normal. On room air  Cardiovascular system: S1 & S2 heard. No pedal edema. Gastrointestinal system: Abdomen is mildly distended, soft and nontender. Normal bowel sounds heard. +ileostomy with soft stool and liquid with gas  Central nervous system: Alert and oriented. Non focal exam. Speech clear  Extremities: Symmetric in appearance bilaterally  Skin: No rashes, lesions or ulcers on exposed skin  Psychiatry: Judgement and insight appear stable. Mood & affect appropriate.    Data Reviewed: I have personally reviewed following labs and imaging studies  CBC: Recent Labs  Lab 04/13/21 0432 04/14/21 0842 04/15/21 0606 04/16/21 0501 04/17/21 0255 04/18/21 0525 04/19/21 0419  WBC 25.3*    < > 26.1* 23.2* 22.7* 24.9* 20.5*  NEUTROABS 21.9*  --  23.1*  --   --   --   --   HGB 9.0*   < > 8.2* 8.2* 8.0* 7.9* 7.2*  HCT 28.5*   < > 26.5* 27.0* 25.9* 25.5* 22.9*  MCV 81.9   < > 83.1 83.6 84.9 84.4 83.0  PLT 458*   < > 498* 503* 440* 471* 460*   < > = values in this interval not displayed.    Basic Metabolic Panel: Recent Labs  Lab 04/14/21 0842 04/15/21 0606 04/16/21 0501 04/17/21 0255 04/18/21 0525 04/19/21 0419  NA 139 139 133* 135 137  139 136  K 3.2* 3.3* 3.2* 3.4* 4.1  4.1 4.3  CL 104 104 99 102 100  102 102  CO2 28 31 26 27 29  29 28   GLUCOSE 134* 149* 148* 157* 134*  136* 124*  BUN 17 15 17 19  25*  26* 26*  CREATININE 0.42* 0.41* 0.44 0.41* 0.39*  0.42* 0.42*  CALCIUM 7.8* 7.7* 7.8* 7.6* 8.2*  8.3* 7.7*  MG 2.1 2.1 2.4 2.3 2.4  --   PHOS 2.8  --  3.1 3.0 3.8  --     GFR: Estimated Creatinine Clearance: 48.7 mL/min (A) (by C-G formula based on SCr of 0.42 mg/dL (L)). Liver Function Tests: Recent Labs  Lab 04/15/21 0606 04/17/21 0255 04/18/21 0525  AST 16  --   --   ALT 13  --   --   ALKPHOS 63  --   --   BILITOT 0.5  --   --   PROT 5.0*  --   --   ALBUMIN 2.0* 1.7* 1.8*    No results for input(s): LIPASE, AMYLASE in the last 168 hours. No results for input(s): AMMONIA in the last 168 hours. Coagulation Profile: Recent Labs  Lab 04/15/21 1417 04/16/21 0501 04/17/21 0255 04/18/21 0525 04/19/21 0419  INR 1.8* 1.4* 1.4* 1.4* 1.6*    Cardiac Enzymes: No results for input(s): CKTOTAL, CKMB, CKMBINDEX, TROPONINI in the last 168 hours. BNP (last 3 results) No results for input(s): PROBNP in the last 8760 hours. HbA1C: No results for input(s): HGBA1C in the last 72 hours. CBG: Recent Labs  Lab 04/18/21 1142 04/18/21 1804 04/18/21 2352 04/19/21 0529 04/19/21 0748  GLUCAP 135* 134* 123* 115* 119*    Lipid Profile: No results for input(s): CHOL, HDL, LDLCALC, TRIG, CHOLHDL, LDLDIRECT in the last 72 hours.  Thyroid Function  Tests: No results for input(s): TSH, T4TOTAL, FREET4, T3FREE, THYROIDAB in the last 72 hours. Anemia Panel: No results for input(s): VITAMINB12, FOLATE, FERRITIN, TIBC, IRON, RETICCTPCT in the last 72 hours. Sepsis Labs: No results for input(s): PROCALCITON, LATICACIDVEN in the last 168 hours.   No results found for this or any previous visit (from the past 240 hour(s)).    Radiology Studies: No results found.    Scheduled Meds:  amLODipine  5 mg Oral Daily   Chlorhexidine Gluconate Cloth  6 each Topical Daily   feeding supplement  237 mL Oral TID BM   insulin aspart  0-9 Units Subcutaneous Q6H   irbesartan  300 mg Oral Daily   levothyroxine  112 mcg Oral QAC breakfast   nystatin  5 mL Oral QID   pantoprazole (PROTONIX) IV  40 mg Intravenous Daily   sertraline  50 mg Oral QHS   sodium chloride flush  10-40 mL Intracatheter Q12H   Warfarin - Pharmacist Dosing Inpatient   Does not apply q1600   Continuous Infusions:  sodium chloride 1,000 mL (04/17/21 0622)   heparin 1,900 Units/hr (04/19/21 0920)   TPN ADULT (ION) 75 mL/hr at 04/19/21 0630   TPN ADULT (ION)       LOS: 18 days      Time spent: 20 minutes   Dessa Phi, DO [facility] 04/19/2021, 9:57 AM   Available via Epic secure chat 7am-7pm After these hours, please refer to coverage provider listed on [email or web address]

## 2021-04-19 NOTE — TOC Progression Note (Signed)
Transition of Care [facility]) - Progression Note    Patient Details  Name: Gabriela [name] MRN: [id number] Date of Birth: 01-04-1932  [facility] CM/SW Contact  Izola Price, RN Phone Number: 04/19/2021, 1:03 PM  Clinical Narrative: Unit RN asked RN CM about a hospice consult per patient request due to patient feeling she, "can't push through all this". Patient was admitted for large bowel obstruction due to mass that was suspect/concerning for another malignancy. On 9/12 she had a subtotal colectomy with ileostomy.   Significant PMH for cancer perr H&P provider notes, "hypertension, stroke, TIA, GERD, hypothyroidism, depression with anxiety, hard of hearing, hiatal hernia, atrial fibrillation on Coumadin, breast cancer, bladder cancer, anemia, who presents with abdominal pain and constipation".   Currently on continuous TPN, Heparin gtt (as of 9/24), has been unable to ambulate, clear liquid diet, purewick due to inability/difficulty/weakness to get to bathroom/ambulate and new onset incontinence.    Patient is a DNR. Will check with provider for a palliative consult with patient to explore her current feelings, wishes, future care.   Simmie Davies RN CM   Tuggle,Susan (Daughter)  [phone] (Mobile) Expected Discharge Plan: [place] Barriers to Discharge: Continued Medical Work up  Expected Discharge Plan and Services Expected Discharge Plan: [place] Choice: [place] arrangements for the past 2 months: Single Family Home                                       Social Determinants of Health (SDOH) Interventions    Readmission Risk Interventions No flowsheet data found.

## 2021-04-19 NOTE — Progress Notes (Signed)
[place] for warfarin with heparin bridge Indication: atrial fibrillation  Patient Measurements: Height: 5\' 3"  (160 cm) Weight: 77.1 kg (169 lb 15.6 oz) IBW/kg (Calculated) : 52.4  Heparin Dosing Weight: 68.4 kg   Vital Signs: Temp: 97.8 F (36.6 C) (09/25 1532) Temp Source: Oral (09/25 1532) BP: 135/61 (09/25 1532) Pulse Rate: 70 (09/25 1532)  Labs: Recent Labs    04/17/21 0255 04/17/21 1255 04/18/21 0525 04/18/21 1515 04/19/21 0042 04/19/21 0419 04/19/21 1057 04/19/21 1950  HGB 8.0*  --  7.9*  --   --  7.2*  --   --   HCT 25.9*  --  25.5*  --   --  22.9*  --   --   PLT 440*  --  471*  --   --  460*  --   --   LABPROT 17.0*  --  17.2*  --   --  19.3*  --   --   INR 1.4*  --  1.4*  --   --  1.6*  --   --   HEPARINUNFRC 1.02*   < > 0.25*   < > 0.57  --  0.27* 0.77*  CREATININE 0.41*  --  0.39*  0.42*  --   --  0.42*  --   --    < > = values in this interval not displayed.     Estimated Creatinine Clearance: 48.7 mL/min (A) (by C-G formula based on SCr of 0.42 mg/dL (L)).   Assessment: 87 y.o. female with medical history significant of hypertension, stroke, TIA, GERD, hypothyroidism, depression with anxiety, hard of hearing, hiatal hernia, atrial fibrillation on Coumadin, breast cancer, bladder cancer, anemia, who presents with abdominal pain and constipation.   Pharmacy has been consulted for heparin bridge and warfarin.    PTA warfarin regimen: 3 mg on Sat/Sun/Mon/Tue; 4 mg on Wed/Thur/Fri   Heparin level tripled following protocol guided increase, but returned to subtherapeutic when given the original dose (1500 units/hr). H&H are noted to be trending down, continue to follow.  Pt with vit K dosed 9/21 in advance of ileostomy revision - warfarin held prior to surgery and resumed afterward while on heparin bridge.  Date Time HL Rate/Comment  9/22 1711 0.29 Subtherapeutic, 1500 > 1650  units/hr 9/23 0255 1.02 Supratherapeutic, 1650 > 1500 units/hr 9/23 1255 0.29 Subtherapeutic 9/23 2238 0.44 Therapeutic x 1 9/24 0525 0.25 Subtherapeutic  9/24 1515 0.24 Subtherapeutic  9/25 0042 0.57 Therapeutic x 1 9/25    1057 0.27 Subtherapeutic 9/25 1950 0.77 Supratherapeutic   Goal of Therapy:  INR 2-3 Heparin level 0.3 - 0.7 Monitor platelets by anticoagulation protocol: Yes    Date   INR   Warfarin Dose  9/14    1.4   5 mg 9/15    1.6   5 mg 9/16    2.4   2 mg 9/17    2.9   HOLD  9/18    3.3   1 mg  9/19    3.6   HOLD   9/20    3.8   HOLD  9/21    3.5   HOLD / 5 mg IV vit K 9/22    1.4   4 mg 9/23    1.4   4 mg  9/24   1.4   5 mg  9/25   1.6   5 mg    Plan:   Heparin HL supratherapeutic. Decrease heparin infusion to 1950 units/hr  Recheck HL in 8 hours after rate change Continue to monitor H&H and platelets  Warfarin INR remains subtherapeutic Warfarin 5 mg today Plan to stop Heparin after 2 consecutive therapeutic INRs Follow up INR with morning labs.  Sherilyn Banker, PharmD Clinical Pharmacist 04/19/2021 8:37 PM

## 2021-04-19 NOTE — Progress Notes (Signed)
[facility] for warfarin with heparin bridge Indication: atrial fibrillation  Patient Measurements: Height: 5\' 3"  (160 cm) Weight: 77.1 kg (169 lb 15.6 oz) IBW/kg (Calculated) : 52.4  Heparin Dosing Weight: 68.4 kg   Vital Signs: Temp: 98.1 F (36.7 C) (09/25 0751) Temp Source: Oral (09/25 0751) BP: 130/59 (09/25 0751) Pulse Rate: 74 (09/25 0751)  Labs: Recent Labs    04/17/21 0255 04/17/21 1255 04/18/21 0525 04/18/21 1515 04/19/21 0042 04/19/21 0419 04/19/21 1057  HGB 8.0*  --  7.9*  --   --  7.2*  --   HCT 25.9*  --  25.5*  --   --  22.9*  --   PLT 440*  --  471*  --   --  460*  --   LABPROT 17.0*  --  17.2*  --   --  19.3*  --   INR 1.4*  --  1.4*  --   --  1.6*  --   HEPARINUNFRC 1.02*   < > 0.25* 0.24* 0.57  --  0.27*  CREATININE 0.41*  --  0.39*  0.42*  --   --  0.42*  --    < > = values in this interval not displayed.     Estimated Creatinine Clearance: 48.7 mL/min (A) (by C-G formula based on SCr of 0.42 mg/dL (L)).   Assessment: 87 y.o. female with medical history significant of hypertension, stroke, TIA, GERD, hypothyroidism, depression with anxiety, hard of hearing, hiatal hernia, atrial fibrillation on Coumadin, breast cancer, bladder cancer, anemia, who presents with abdominal pain and constipation.   Pharmacy has been consulted for heparin bridge and warfarin.    PTA warfarin regimen: 3 mg on Sat/Sun/Mon/Tue; 4 mg on Wed/Thur/Fri   Heparin level tripled following protocol guided increase, but returned to subtherapeutic when given the original dose (1500 units/hr). H&H are noted to be trending down, continue to follow.  Pt with vit K dosed 9/21 in advance of ileostomy revision - warfarin held prior to surgery and resumed afterward while on heparin bridge.  Date Time HL Rate/Comment  9/22 1711 0.29 Subtherapeutic, 1500 > 1650 units/hr 9/23 0255 1.02 Supratherapeutic, 1650 > 1500  units/hr 9/23 1255 0.29 Subtherapeutic 9/23 2238 0.44 Therapeutic x 1 9/24 0525 0.25 Subtherapeutic  9/24 1515 0.24 Subtherapeutic  9/25 0042 0.57 Therapeutic x 1 9/25    1057 0.27 Subtherapeutic  Goal of Therapy:  INR 2-3 Heparin level 0.3 - 0.7 Monitor platelets by anticoagulation protocol: Yes    Date   INR   Warfarin Dose  9/14    1.4   5 mg 9/15    1.6   5 mg 9/16    2.4   2 mg 9/17    2.9   HOLD  9/18    3.3   1 mg  9/19    3.6   HOLD   9/20    3.8   HOLD  9/21    3.5   HOLD / 5 mg IV vit K 9/22    1.4   4 mg 9/23    1.4   4 mg  9/24   1.4   5 mg  9/25   1.6   5 mg    Plan:   Heparin Increase heparin infusion to 2000 units/hr  Recheck HL 8 hours to confirm  Continue to monitor H&H and platelets  Warfarin INR remains subtherapeutic Warfarin 5 mg today Plan to stop Heparin after 2 consecutive therapeutic INRs  Follow up INR with morning labs.  Lu Duffel, PharmD, BCPS Clinical Pharmacist 04/19/2021 11:45 AM

## 2021-04-19 NOTE — Progress Notes (Signed)
[facility] for warfarin with heparin bridge Indication: atrial fibrillation  Patient Measurements: Height: 5\' 3"  (160 cm) Weight: 77.5 kg (170 lb 13.7 oz) IBW/kg (Calculated) : 52.4  Heparin Dosing Weight: 68.4 kg   Vital Signs: Temp: 98.1 F (36.7 C) (09/24 1937) Temp Source: Oral (09/24 1937) BP: 140/52 (09/24 1937) Pulse Rate: 80 (09/24 1937)  Labs: Recent Labs    04/16/21 0501 04/16/21 1711 04/17/21 0255 04/17/21 1255 04/18/21 0525 04/18/21 1515 04/19/21 0042  HGB 8.2*  --  8.0*  --  7.9*  --   --   HCT 27.0*  --  25.9*  --  25.5*  --   --   PLT 503*  --  440*  --  471*  --   --   LABPROT 16.9*  --  17.0*  --  17.2*  --   --   INR 1.4*  --  1.4*  --  1.4*  --   --   HEPARINUNFRC  --    < > 1.02*   < > 0.25* 0.24* 0.57  CREATININE 0.44  --  0.41*  --  0.39*  0.42*  --   --    < > = values in this interval not displayed.     Estimated Creatinine Clearance: 48.8 mL/min (A) (by C-G formula based on SCr of 0.42 mg/dL (L)).   Assessment: 87 y.o. female with medical history significant of hypertension, stroke, TIA, GERD, hypothyroidism, depression with anxiety, hard of hearing, hiatal hernia, atrial fibrillation on Coumadin, breast cancer, bladder cancer, anemia, who presents with abdominal pain and constipation.   Pharmacy has been consulted for heparin bridge and warfarin.    PTA warfarin regimen: 3 mg on Sat/Sun/Mon/Tue; 4 mg on Wed/Thur/Fri   Heparin level tripled following protocol guided increase, but returned to subtherapeutic when given the original dose (1500 units/hr). H&H are noted to be trending down, continue to follow.  Pt with vit K dosed 9/21 in advance of ileostomy revision - warfarin held prior to surgery and resumed afterward while on heparin bridge.  Date Time HL Rate/Comment  9/22 1711 0.29 Subtherapeutic, 1500 > 1650 units/hr 9/23 0255 1.02 Supratherapeutic, 1650 > 1500  units/hr 9/23 1255 0.29 Subtherapeutic 9/23 2238 0.44 Therapeutic x 1 9/24 0525 0.25 Subtherapeutic  9/24 1515 0.24 Subtherapeutic  9/25 0042 0.57 Therapeutic x 1  Goal of Therapy:  INR 2-3 Heparin level 0.3 - 0.7 Monitor platelets by anticoagulation protocol: Yes    Date   INR   Warfarin Dose  9/14    1.4   5 mg 9/15    1.6   5 mg 9/16    2.4   2 mg 9/17    2.9   HOLD  9/18    3.3   1 mg  9/19    3.6   HOLD   9/20    3.8   HOLD  9/21    3.5   HOLD / 5 mg IV vit K 9/22    1.4   4 mg 9/23    1.4   4 mg  9/24   1.4   5 mg    Plan:   Heparin Continue heparin infusion at 1900 units/hr  Recheck HL 8 hours to confirm  Continue to monitor H&H and platelets  Warfarin INR remains subtherapeutic Warfarin 5 mg today Plan to stop Heparin after 2 consecutive therapeutic INRs Follow up INR with morning labs.  Renda Rolls, PharmD, [facility] 04/19/2021  1:18 AM

## 2021-04-19 NOTE — Progress Notes (Signed)
[facility] Day(s): [place] op day(s): 4 Days Post-Op from ileostomy revision  Interval History:  Patient seen and examined No acute events or new complaints overnight.  Patient reports she is feeling better and that her nausea has resolved. No fever, chills.  Leukocytosis persists, but is slightly lower today. Renal function remains normal; hypokalemia resolved Ileostomy output has picked up significantly with over 2 L out of liquid stool. Worked with therapies; recommending SNF  Vital signs in last 24 hours: [min-max] current  Temp:  [98.1 F (36.7 C)-98.2 F (36.8 C)] 98.1 F (36.7 C) (09/25 0751) Pulse Rate:  [73-81] 74 (09/25 0751) Resp:  [16-20] 20 (09/25 0751) BP: (130-152)/(52-62) 130/59 (09/25 0751) SpO2:  [94 %-97 %] 97 % (09/25 0751) Weight:  [77.1 kg] 77.1 kg (09/25 0531)     Height: 5\' 3"  (160 cm) Weight: 77.1 kg BMI (Calculated): 30.12   Intake/Output last 2 shifts:  09/24 0701 - 09/25 0700 In: 1435.2 [P.O.:120; I.V.:1315.2] Out: 2800 [Urine:800; Stool:2000]   Physical Exam:  Constitutional: alert, cooperative and no distress  Respiratory: breathing non-labored at rest  Cardiovascular: regular rate and sinus rhythm  Gastrointestinal:  Soft, non-tender, nondistended, she is not peritonitic. Ileostomy in the right abdomen, pink.  There is some thin light brown liquid stool in the bag, along with some thicker material at the ostomy opening..  Integumentary: Laparoscopic incisions are CDI with dermabond, no erythema or drainage   Labs:  CBC Latest Ref Rng & Units 04/19/2021 04/18/2021 04/17/2021  WBC 4.0 - 10.5 K/uL 20.5(H) 24.9(H) 22.7(H)  Hemoglobin 12.0 - 15.0 g/dL 7.2(L) 7.9(L) 8.0(L)  Hematocrit 36.0 - 46.0 % 22.9(L) 25.5(L) 25.9(L)  Platelets 150 - 400 K/uL 460(H) 471(H) 440(H)   CMP Latest Ref Rng & Units 04/19/2021 04/18/2021 04/18/2021  Glucose 70 - 99 mg/dL 124(H) 134(H) 136(H)  BUN 8 - 23 mg/dL 26(H)  25(H) 26(H)  Creatinine 0.44 - 1.00 mg/dL 0.42(L) 0.39(L) 0.42(L)  Sodium 135 - 145 mmol/L 136 137 139  Potassium 3.5 - 5.1 mmol/L 4.3 4.1 4.1  Chloride 98 - 111 mmol/L 102 100 102  CO2 22 - 32 mmol/L 28 29 29   Calcium 8.9 - 10.3 mg/dL 7.7(L) 8.2(L) 8.3(L)  Total Protein 6.5 - 8.1 g/dL - - -  Total Bilirubin 0.3 - 1.2 mg/dL - - -  Alkaline Phos 38 - 126 U/L - - -  AST 15 - 41 U/L - - -  ALT 0 - 44 U/L - - -     Imaging studies: No new pertinent imaging studies   Assessment/Plan:  87 y.o. female 4 Days Post-Op s/p ileostomy revision s/p robotic assisted laparoscopic subtotal colectomy with end ileostomy for obstructing transverse colon mass on 09/12.   - Will slowly advance diet  - Continue TPN at goal rate and adjust electrolytes based on labs; may be able to cut to half rate tomorrow, pending diet tolerance  - Will hold off on Abx for now; unsure what we would be treating; she is without fevers  - Monitor abdominal examination; on-going ileostomy function, will improve with nutrition.  Consider adding Imodium if more solid diet does not decrease volume or improve consistency. - Appreciate WOC RN for ileostomy teaching              - Monitor leukocytosis; uncertain etiology at this point.   - Monitor abdominal examination; on-going ileostomy function             -  Early mobilization encouraged, worked with therapies; recommending SNF             - Further management per primary service; we will follow  - Reviewed pathology & tumor board: T2N0 Adenocarcinoma; refer to oncology as outpatient   All of the above findings and recommendations were discussed with the patient, patient's family, and the medical team, and all of patient's and family's questions were answered to their expressed satisfaction.

## 2021-04-20 DIAGNOSIS — K56609 Unspecified intestinal obstruction, unspecified as to partial versus complete obstruction: Secondary | ICD-10-CM | POA: Diagnosis not present

## 2021-04-20 LAB — COMPREHENSIVE METABOLIC PANEL
ALT: 11 U/L (ref 0–44)
AST: 16 U/L (ref 15–41)
Albumin: 1.8 g/dL — ABNORMAL LOW (ref 3.5–5.0)
Alkaline Phosphatase: 79 U/L (ref 38–126)
Anion gap: 8 (ref 5–15)
BUN: 18 mg/dL (ref 8–23)
CO2: 24 mmol/L (ref 22–32)
Calcium: 7.9 mg/dL — ABNORMAL LOW (ref 8.9–10.3)
Chloride: 101 mmol/L (ref 98–111)
Creatinine, Ser: 0.32 mg/dL — ABNORMAL LOW (ref 0.44–1.00)
GFR, Estimated: >60 mL/min (ref >60)
Glucose, Bld: 117 mg/dL — ABNORMAL HIGH (ref 70–99)
Potassium: 4.3 mmol/L (ref 3.5–5.1)
Sodium: 133 mmol/L — ABNORMAL LOW (ref 135–145)
Total Bilirubin: 0.4 mg/dL (ref 0.3–1.2)
Total Protein: 5.1 g/dL — ABNORMAL LOW (ref 6.5–8.1)

## 2021-04-20 LAB — CBC
HCT: 23.1 % — ABNORMAL LOW (ref 36.0–46.0)
Hemoglobin: 7.1 g/dL — ABNORMAL LOW (ref 12.0–15.0)
MCH: 27.2 pg (ref 26.0–34.0)
MCHC: 30.7 g/dL (ref 30.0–36.0)
MCV: 88.5 fL (ref 80.0–100.0)
Platelets: 478 10*3/uL — ABNORMAL HIGH (ref 150–400)
RBC: 2.61 MIL/uL — ABNORMAL LOW (ref 3.87–5.11)
RDW: 16.7 % — ABNORMAL HIGH (ref 11.5–15.5)
WBC: 19.4 10*3/uL — ABNORMAL HIGH (ref 4.0–10.5)
nRBC: 0.2 % (ref 0.0–0.2)

## 2021-04-20 LAB — URINE CULTURE

## 2021-04-20 LAB — PHOSPHORUS: Phosphorus: 3.7 mg/dL (ref 2.5–4.6)

## 2021-04-20 LAB — PROTIME-INR
INR: 2.1 — ABNORMAL HIGH (ref 0.8–1.2)
Prothrombin Time: 23.7 seconds — ABNORMAL HIGH (ref 11.4–15.2)

## 2021-04-20 LAB — GLUCOSE, CAPILLARY
Glucose-Capillary: 107 mg/dL — ABNORMAL HIGH (ref 70–99)
Glucose-Capillary: 113 mg/dL — ABNORMAL HIGH (ref 70–99)
Glucose-Capillary: 114 mg/dL — ABNORMAL HIGH (ref 70–99)
Glucose-Capillary: 131 mg/dL — ABNORMAL HIGH (ref 70–99)

## 2021-04-20 LAB — HEPARIN LEVEL (UNFRACTIONATED): Heparin Unfractionated: 0.5 IU/mL (ref 0.30–0.70)

## 2021-04-20 LAB — MAGNESIUM: Magnesium: 2.1 mg/dL (ref 1.7–2.4)

## 2021-04-20 LAB — TRIGLYCERIDES: Triglycerides: 77 mg/dL (ref <150)

## 2021-04-20 MED ORDER — TRACE MINERALS CU-MN-SE-ZN 300-55-60-3000 MCG/ML IV SOLN
INTRAVENOUS | Status: AC
  Filled 2021-04-20: qty 320

## 2021-04-20 MED ORDER — TRACE MINERALS CU-MN-SE-ZN 300-55-60-3000 MCG/ML IV SOLN
INTRAVENOUS | Status: DC
  Filled 2021-04-20: qty 600

## 2021-04-20 MED ORDER — WARFARIN SODIUM 3 MG PO TABS
3.0000 mg | ORAL_TABLET | Freq: Once | ORAL | Status: AC
  Administered 2021-04-20: 3 mg via ORAL
  Filled 2021-04-20: qty 1

## 2021-04-20 NOTE — Progress Notes (Signed)
[facility] for warfarin with heparin bridge Indication: atrial fibrillation  Patient Measurements: Height: 5\' 3"  (160 cm) Weight: 77.1 kg (169 lb 15.6 oz) IBW/kg (Calculated) : 52.4  Vital Signs: Temp: 97.9 F (36.6 C) (09/26 0505) Temp Source: Oral (09/26 0505) BP: 128/53 (09/26 0505) Pulse Rate: 69 (09/26 0505)  Labs: Recent Labs    04/18/21 0525 04/18/21 1515 04/19/21 0419 04/19/21 1057 04/19/21 1950 04/20/21 0445  HGB 7.9*  --  7.2*  --   --  7.1*  HCT 25.5*  --  22.9*  --   --  23.1*  PLT 471*  --  460*  --   --  478*  LABPROT 17.2*  --  19.3*  --   --  23.7*  INR 1.4*  --  1.6*  --   --  2.1*  HEPARINUNFRC 0.25*   < >  --  0.27* 0.77* 0.50  CREATININE 0.39*  0.42*  --  0.42*  --   --   --    < > = values in this interval not displayed.     Estimated Creatinine Clearance: 48.7 mL/min (A) (by C-G formula based on SCr of 0.42 mg/dL (L)).   Assessment: 87 y.o. female with medical history significant of hypertension, stroke, TIA, GERD, hypothyroidism, depression with anxiety, hard of hearing, hiatal hernia, atrial fibrillation on Coumadin, breast cancer, bladder cancer, anemia, who presents with abdominal pain and constipation.   Pharmacy has been consulted for restarting warfarin.    PTA warfarin regimen: 3 mg on Sat/Sun/Mon/Tue; 4 mg on Wed/Thur/Fri   Following IV vitamin K to reverse warfarin for a surgery heparin was used to bridge warfarin until we attained a therapeutic INR. Today her INR is therapeutic  Goal of Therapy:  INR 2-3 Monitor platelets by anticoagulation protocol: Yes    Date   INR   Warfarin Dose  9/14    1.4   5 mg 9/15    1.6   5 mg 9/16    2.4   2 mg 9/17    2.9   HOLD  9/18    3.3   1 mg  9/19    3.6   HOLD   9/20    3.8   HOLD  9/21    3.5   HOLD / 5 mg IV vit K 9/22    1.4   4 mg 9/23    1.4   4 mg  9/24   1.4   5 mg  9/25   1.6   5 mg  9/26   2.1   3 mg    Plan:   Warfarin INR is  therapeutic Warfarin 3 mg today (equal to home dose) Follow up INR with morning labs.  Vallery Sa, PharmD

## 2021-04-20 NOTE — Progress Notes (Signed)
Occupational Therapy Treatment Patient Details Name: Gabriela [name] MRN: [id number] DOB: 10-29-1931 Today's Date: 04/20/2021   History of present illness Pt. is a 87 y.o. female with medical history significant of hypertension, stroke, TIA, GERD, hypothyroidism, depression with anxiety, hard of hearing, hiatal hernia, atrial fibrillation on Coumadin, breast cancer, bladder cancer, anemia, who presents with abdominal pain and constipation. 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   OT comments  Pt seen for OT tx. Pt lightly sleeping, wakes to OT's arrival and agreeable to session. Pt denies SOB or pain. Pt performed log roll technique for bed mobility with 1 VC for technique and Min A for trunk elevation to sit EOB and Min A for BLE mgt back to bed. Pt tolerated sitting EOB without UE support for approx 27min while performing grooming tasks. Set up and supervision for safety. No LOB noted, which is an improvement from previous session last Friday. Pt on 1L O2 throughout, SpO2 96-97%, HR in 80's-90's. Once returned to bed, pt endorsed feeling soiled. Pt used log roll technique with supervision side to side, requiring MAX A for pericare. Soiled sacral foam pad removed. Sacral area noted to be very red, skin appears very fragile, and pt endorses discomfort. Inner thighs noted to be red/blotchy and sore to touch as well. RN notified of both at end of session. Clean PureWick reapplied. Pt demonstrated improvement today, progress towards OT goals. Continue to recommend short term rehab upon discharge in order to maximize return to PLOF.    Recommendations for follow up therapy are one component of a multi-disciplinary discharge planning process, led by the attending physician.  Recommendations may be updated based on patient status, additional functional criteria and insurance authorization.     Follow Up Recommendations  SNF    Equipment Recommendations  3 in 1 bedside commode    Recommendations for Other Services      Precautions / Restrictions Precautions Precautions: Fall Precaution Comments: colostomy bag Restrictions Weight Bearing Restrictions: No       Mobility Bed Mobility Overal bed mobility: Needs Assistance Bed Mobility: Rolling;Sidelying to Sit;Sit to Sidelying Rolling: Supervision Sidelying to sit: Min assist     Sit to sidelying: Min assist General bed mobility comments: Min A for trunk to upright position and for BLE with return to bed    Transfers Overall transfer level: Needs assistance Equipment used: None Transfers: Lateral/Scoot Transfers        Lateral/Scoot Transfers: Min guard General transfer comment: Increased time/effort, CGA for safety, pt able to perform lateral scoots along the EOB to improve positioning prior to return to bed    Balance Overall balance assessment: Needs assistance Sitting-balance support: No upper extremity supported;Feet supported Sitting balance-Leahy Scale: Good Sitting balance - Comments: pt able to perform seated grooming tasks without UE support on the EOB, no LOB                              ADL either performed or assessed with clinical judgement   ADL Overall ADL's : Needs assistance/impaired     Grooming: Sitting;Set up;Supervision/safety;Oral care Grooming Details (indicate cue type and reason): Pt set up with items to clean her dentures, seated EOB. OT rinsed off for thoroughness, otherwise pt was able to perform without assist and no LOB while seated EOB  Toileting- Clothing Manipulation and Hygiene: Bed level;Maximal assistance Toileting - Clothing Manipulation Details (indicate cue type and reason): Pt performed rolling with supervision for bed level skin check and pericare/hygiene. Sacral area and in between thighs noted to be red/sore to touch.  Sacral pad was soiled - removed. Notified RN.             Vision       Perception     Praxis      Cognition Arousal/Alertness: Awake/alert Behavior During Therapy: WFL for tasks assessed/performed Overall Cognitive Status: Within Functional Limits for tasks assessed                                          Exercises    Shoulder Instructions       General Comments SpO2 96% on 1L O2, HR in 90's; During pericare, sacral area and in between thighs noted to be red/sore to touch. Sacral pad was soiled - removed. Notified RN.    Pertinent Vitals/ Pain       Pain Assessment: No/denies pain Pain Score: 3  Pain Location: Abdomem Pain Descriptors / Indicators: Sore Pain Intervention(s): Premedicated before session;Monitored during session  Home Living                                          Prior Functioning/Environment              Frequency  Min 2X/week        Progress Toward Goals  OT Goals(current goals can now be found in the care plan section)  Progress towards OT goals: Progressing toward goals  Acute Rehab OT Goals Patient Stated Goal: To get stronger OT Goal Formulation: With patient/family Time For Goal Achievement: 05/01/21 Potential to Achieve Goals: Good  Plan Discharge plan remains appropriate;Frequency remains appropriate    Co-evaluation                 AM-PAC OT "6 Clicks" Daily Activity     Outcome Measure   Help from another person eating meals?: None Help from another person taking care of personal grooming?: A Little Help from another person toileting, which includes using toliet, bedpan, or urinal?: A Lot Help from another person bathing (including washing, rinsing, drying)?: A Lot Help from another person to put on and taking off regular upper body clothing?: A Little Help from another person to put on and taking off regular lower body clothing?: A Lot 6 Click Score: 16    End of  Session Equipment Utilized During Treatment: Oxygen  OT Visit Diagnosis: Unsteadiness on feet (R26.81);Muscle weakness (generalized) (M62.81)   Activity Tolerance Patient tolerated treatment well   Patient Left in bed;with call bell/phone within reach;with bed alarm set   Nurse Communication Other (comment) (sacral area and inner thighs red/sore, removed soiled sacral pad)        Time: 2694-8546 OT Time Calculation (min): 28 min  Charges: OT General Charges $OT Visit: 1 Visit OT Treatments $Self Care/Home Management : 23-37 mins  Ardeth Perfect., MPH, MS, OTR/L ascom [phone] 04/20/21, 3:26 PM

## 2021-04-20 NOTE — Progress Notes (Signed)
[name]   Interval History:  Patient seen and examined No acute events or new complaints overnight.  Patient reports she is feeling better and making good improvements No fever, chills, nausea, emesis Leukocytosis continues to make small improvements; down to 19.4K Hgb is down trending but stable; 7.1 Renal function remains normal; sCr - 0.32; UO - 700 ccs No electrolyte derangements INR 2.1 this morning She is on regular diet + TPN on goal rate  Ileostomy with 1600 ccs out in last 24 hours She did pass some stool from rectum today  Vital signs in last 24 hours: [min-max] current  Temp:  [97.8 F (36.6 C)-98.6 F (37 C)] 98.4 F (36.9 C) (09/26 1147) Pulse Rate:  [69-77] 77 (09/26 1147) Resp:  [17-24] 20 (09/26 1147) BP: (128-143)/(53-74) 131/63 (09/26 1147) SpO2:  [96 %-99 %] 98 % (09/26 1147)     Height: 5\' 3"  (160 cm) Weight: 77.1 kg BMI (Calculated): 30.12   Intake/Output last 2 shifts:  09/25 0701 - 09/26 0700 In: 2430.4 [P.O.:240; I.V.:2190.4] Out: 2300 [Urine:700; Stool:1600]   Physical Exam:  Constitutional: alert, cooperative and no distress  Respiratory: breathing non-labored at rest  Cardiovascular: regular rate and sinus rhythm  Gastrointestinal:  Soft, non-tender, less distended, she is not peritonitic. Ileostomy in the right abdomen, pink, there is some liquid stool in bag, + flatus Integumentary: Laparoscopic incisions are CDI with dermabond, no erythema or drainage   Labs:  CBC Latest Ref Rng & Units 04/20/2021 04/19/2021 04/18/2021  WBC 4.0 - 10.5 K/uL 19.4(H) 20.5(H) 24.9(H)  Hemoglobin 12.0 - 15.0 g/dL 7.1(L) 7.2(L) 7.9(L)  Hematocrit 36.0 - 46.0 % 23.1(L) 22.9(L) 25.5(L)  Platelets 150 - 400 K/uL 478(H) 460(H) 471(H)   CMP Latest Ref Rng & Units 04/20/2021 04/19/2021 04/18/2021  Glucose 70 - 99 mg/dL 117(H) 124(H) 134(H)  BUN 8 - 23 mg/dL 18 26(H) 25(H)   Creatinine 0.44 - 1.00 mg/dL 0.32(L) 0.42(L) 0.39(L)  Sodium 135 - 145 mmol/L 133(L) 136 137  Potassium 3.5 - 5.1 mmol/L 4.3 4.3 4.1  Chloride 98 - 111 mmol/L 101 102 100  CO2 22 - 32 mmol/L 24 28 29   Calcium 8.9 - 10.3 mg/dL 7.9(L) 7.7(L) 8.2(L)  Total Protein 6.5 - 8.1 g/dL 5.1(L) - -  Total Bilirubin 0.3 - 1.2 mg/dL 0.4 - -  Alkaline Phos 38 - 126 U/L 79 - -  AST 15 - 41 U/L 16 - -  ALT 0 - 44 U/L 11 - -     Imaging studies: No new pertinent imaging studies   Assessment/Plan:  87 y.o. female 5 Days Post-Op s/p ileostomy revision s/p robotic assisted laparoscopic subtotal colectomy with end ileostomy for obstructing transverse colon mass on 09/12.   - Continue regular diet + Nutritional supplementation            - Will begin to wean TPN today             - Will hold off on Abx for now; unsure what we would be treating; she is without fevers  - Monitor abdominal examination; on-going ileostomy function, will improve with nutrition. May need to start imodium in next 24-48 hours pending output - Appreciate WOC RN for ileostomy teaching              - Monitor leukocytosis; uncertain etiology at this point.   - Monitor abdominal examination; on-going ileostomy function             -  Early mobilization encouraged, worked with therapies; recommending SNF             - Further management per primary service; we will follow             - Reviewed pathology & tumor board: T2N0 Adenocarcinoma; refer to oncology as outpatient    - Discharge Planning; I believe we are getting closer to discharge, will wean from TPN today/tomorrow, monitor ileostomy output to determine need for antidiarrheal, also pending placement, anticipate ready for DC in next 48 hours or so  All of the above findings and recommendations were discussed with the patient, patient's family (daughter at bedside), and the medical team, and all of patient's and family's questions were answered to their expressed  satisfaction.  -- Gabriela Simon, PA-C [facility] 04/20/2021, 12:04 PM [phone] M-F: 7am - 4pm

## 2021-04-20 NOTE — Progress Notes (Signed)
[employer] for warfarin with heparin bridge Indication: atrial fibrillation  Patient Measurements: Height: 5\' 3"  (160 cm) Weight: 77.1 kg (169 lb 15.6 oz) IBW/kg (Calculated) : 52.4  Heparin Dosing Weight: 68.4 kg   Vital Signs: Temp: 97.9 F (36.6 C) (09/26 0505) Temp Source: Oral (09/26 0505) BP: 128/53 (09/26 0505) Pulse Rate: 69 (09/26 0505)  Labs: Recent Labs    04/18/21 0525 04/18/21 1515 04/19/21 0419 04/19/21 1057 04/19/21 1950 04/20/21 0445  HGB 7.9*  --  7.2*  --   --  7.1*  HCT 25.5*  --  22.9*  --   --  23.1*  PLT 471*  --  460*  --   --  478*  LABPROT 17.2*  --  19.3*  --   --  23.7*  INR 1.4*  --  1.6*  --   --  2.1*  HEPARINUNFRC 0.25*   < >  --  0.27* 0.77* 0.50  CREATININE 0.39*  0.42*  --  0.42*  --   --   --    < > = values in this interval not displayed.     Estimated Creatinine Clearance: 48.7 mL/min (A) (by C-G formula based on SCr of 0.42 mg/dL (L)).   Assessment: 87 y.o. female with medical history significant of hypertension, stroke, TIA, GERD, hypothyroidism, depression with anxiety, hard of hearing, hiatal hernia, atrial fibrillation on Coumadin, breast cancer, bladder cancer, anemia, who presents with abdominal pain and constipation.   Pharmacy has been consulted for heparin bridge and warfarin.    PTA warfarin regimen: 3 mg on Sat/Sun/Mon/Tue; 4 mg on Wed/Thur/Fri   Heparin level tripled following protocol guided increase, but returned to subtherapeutic when given the original dose (1500 units/hr). H&H are noted to be trending down, continue to follow.  Pt with vit K dosed 9/21 in advance of ileostomy revision - warfarin held prior to surgery and resumed afterward while on heparin bridge.  Date Time HL Rate/Comment  9/22 1711 0.29 Subtherapeutic, 1500 > 1650 units/hr 9/23 0255 1.02 Supratherapeutic, 1650 > 1500 units/hr 9/23 1255 0.29 Subtherapeutic 9/23 2238 0.44 Therapeutic x  1 9/24 0525 0.25 Subtherapeutic  9/24 1515 0.24 Subtherapeutic  9/25 0042 0.57 Therapeutic x 1 9/25    1057 0.27 Subtherapeutic 9/25 1950 0.77 Supratherapeutic  9/26 0445 0.50 Therapeutic x 1  Goal of Therapy:  INR 2-3 Heparin level 0.3 - 0.7 Monitor platelets by anticoagulation protocol: Yes    Date   INR   Warfarin Dose  9/14    1.4   5 mg 9/15    1.6   5 mg 9/16    2.4   2 mg 9/17    2.9   HOLD  9/18    3.3   1 mg  9/19    3.6   HOLD   9/20    3.8   HOLD  9/21    3.5   HOLD / 5 mg IV vit K 9/22    1.4   4 mg 9/23    1.4   4 mg  9/24   1.4   5 mg  9/25   1.6   5 mg  9/26   2.1    Plan:   Heparin Continue heparin infusion at 1950 units/hr  INR therapeutic x 1 Recheck HL in 8 hours to confirm. Continue to monitor H&H and platelets  Warfarin INR remains subtherapeutic Warfarin 5 mg today Plan to stop Heparin after 2 consecutive therapeutic  INRs Follow up INR with morning labs.  Renda Rolls, PharmD, [facility] 04/20/2021 5:44 AM

## 2021-04-20 NOTE — Progress Notes (Signed)
Physical Therapy Treatment Patient Details Name: Gabriela [name] MRN: [id number] DOB: [birth year] Today's Date: 04/20/2021   History of Present Illness Pt. is a 87 y.o. female with medical history significant of hypertension, stroke, TIA, GERD, hypothyroidism, depression with anxiety, hard of hearing, hiatal hernia, atrial fibrillation on Coumadin, breast cancer, bladder cancer, anemia, who presents with abdominal pain and constipation. 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    PT Comments    Pt was pleasant and motivated to participate during the session and put forth good effort throughout.  Pt required min cuing and min A during log roll training and demonstrated good sitting balance at the EOB.  Pt was able to stand and amb a max of 8 feet without assist with fair stability and with SpO2 and HR WNL.  Pt will benefit from PT services in a SNF setting upon discharge to safely address deficits listed in patient problem list for decreased caregiver assistance and eventual return to PLOF.     Recommendations for follow up therapy are one component of a multi-disciplinary discharge planning process, led by the attending physician.  Recommendations may be updated based on patient status, additional functional criteria and insurance authorization.  Follow Up Recommendations  SNF;Supervision/Assistance - 24 hour     Equipment Recommendations  None recommended by PT    Recommendations for Other Services       Precautions / Restrictions Precautions Precautions: Fall Precaution Comments: colostomy bag Restrictions Weight Bearing Restrictions: No     Mobility  Bed Mobility Overal bed mobility: Needs Assistance Bed Mobility: Rolling;Sidelying to Sit Rolling: Min assist Sidelying to sit: Min assist       General bed mobility comments: Log roll training/review with min A  for rolling, BLE control, and trunk to upright position    Transfers Overall transfer level: Needs assistance Equipment used: Rolling walker (2 wheeled) Transfers: Sit to/from Stand Sit to Stand: Min guard;From elevated surface         General transfer comment: Min verbal cues for sequencing most notably for hand placement  Ambulation/Gait Ambulation/Gait assistance: Min guard Gait Distance (Feet): 8 Feet Assistive device: Rolling walker (2 wheeled) Gait Pattern/deviations: Step-through pattern;Decreased step length - right;Decreased step length - left Gait velocity: decreased   General Gait Details: Slow cadence with short B step length but steady without LOB with 8 feet being patient's self-selected max distance   Stairs             Wheelchair Mobility    Modified Rankin (Stroke Patients Only)       Balance Overall balance assessment: Needs assistance Sitting-balance support: Bilateral upper extremity supported;Feet supported Sitting balance-Leahy Scale: Good     Standing balance support: Bilateral upper extremity supported;During functional activity Standing balance-Leahy Scale: Fair Standing balance comment: Min to mod lean on the RW for support                            Cognition Arousal/Alertness: Awake/alert Behavior During Therapy: WFL for tasks assessed/performed Overall Cognitive Status: Within Functional Limits for tasks assessed                                        Exercises Total Joint Exercises Heel Slides: AAROM;Strengthening;Both;5 reps Long Arc Quad: AROM;Strengthening;Both;10 reps  Knee Flexion: AROM;Strengthening;Both;10 reps Other Exercises Other Exercises: Log roll training/review with pt and dtr    General Comments        Pertinent Vitals/Pain Pain Assessment: 0-10 Pain Score: 3  Pain Location: Abdomem Pain Descriptors / Indicators: Sore Pain Intervention(s): Premedicated before  session;Monitored during session    Home Living                      Prior Function            PT Goals (current goals can now be found in the care plan section) Progress towards PT goals: Progressing toward goals    Frequency    Min 2X/week      PT Plan Current plan remains appropriate    Co-evaluation              AM-PAC PT "6 Clicks" Mobility   Outcome Measure  Help needed turning from your back to your side while in a flat bed without using bedrails?: A Little Help needed moving from lying on your back to sitting on the side of a flat bed without using bedrails?: A Little Help needed moving to and from a bed to a chair (including a wheelchair)?: A Little Help needed standing up from a chair using your arms (e.g., wheelchair or bedside chair)?: A Little Help needed to walk in hospital room?: A Lot Help needed climbing 3-5 steps with a railing? : Total 6 Click Score: 15    End of Session Equipment Utilized During Treatment: Gait belt Activity Tolerance: Patient tolerated treatment well Patient left: in chair;with call bell/phone within reach;with chair alarm set;with SCD's reapplied;with family/visitor present Nurse Communication: Mobility status;Other (comment) (Spotty bleading noted on chuck pad) PT Visit Diagnosis: Unsteadiness on feet (R26.81);Muscle weakness (generalized) (M62.81);History of falling (Z91.81);Difficulty in walking, not elsewhere classified (R26.2);Pain     Time: 1010-1037 PT Time Calculation (min) (ACUTE ONLY): 27 min  Charges:  $Therapeutic Exercise: 8-22 mins $Therapeutic Activity: 8-22 mins                     D. Scott Jessup Ogas PT, DPT 04/20/21, 1:57 PM

## 2021-04-20 NOTE — Progress Notes (Signed)
PHARMACY - TOTAL PARENTERAL NUTRITION CONSULT NOTE   Indication: Inadequate oral intake related to acute illness / obstructing colonic mass   Patient Measurements: Height: 5\' 3"  (160 cm) Weight: 77.1 kg (169 lb 15.6 oz) IBW/kg (Calculated) : 52.4 TPN AdjBW (KG): 56 Body mass index is 30.11 kg/m.  Assessment: 87 y.o. female with medical history significant of hypertension, stroke, TIA, GERD, hypothyroidism, depression with anxiety, hard of hearing, hiatal hernia, atrial fibrillation on Coumadin, breast cancer, bladder cancer, Afib and anemia who presents with abdominal pain and constipation and was found to have an obstructing colon mass.  Glucose / Insulin: on no insulin PTA --BG 110 - 128 (1 unit SSI required) Electrolytes: wnl's, Corrected Ca 9.46 Renal: SCr<1, stable Hepatic: LFTs wnl, TG 93 at baseline Intake / Output; MIVF: no MIVF, +9.8L this admission (net neg 1.3L 9/24), lbm 9/24   GI Imaging: No new pertinent imaging studies GI Surgeries / Procedures: 4 Days Post-Op s/p ileostomy revision and 12  days s/p robotic assisted laparoscopic subtotal colectomy with end ileostomy  Central access: 04/14/21  TPN start date: 04/14/21   Nutritional Goals: Goal TPN rate is 75 mL/hr (provides 90 g of protein and 1800 kcals per day)  RD Assessment: Estimated Needs Total Energy Estimated Needs: 1600-1800kcal/day Total Protein Estimated Needs: 80-90g/day Total Fluid Estimated Needs: 1.3-1.5L/day  Current Nutrition:  Full liq since 9/23  Plan:  reduce TPN to 75 mL/hr (total volume including overfill: 1060 mL) Nutritional components: Amino acids (Travasol 15%): 48 grams Dextrose: 141 grams Lipids (20% SMOF lipids): 29 grams kCal: 960 /24 hours Electrolytes in TPN (standard): Na 35mEq/L, K 57mEq/L, Ca 1mEq/L, Mg 36mEq/L, and Phos 62mmol/L. Cl:Ac 1:1 Add standard MVI and trace elements to TPN continue Sensitive q6h SSI and adjust as needed  Monitor TPN labs on Mon/Thurs, daily  until stable   Dallie Piles, PharmD, BCPS Clinical Pharmacist 04/20/2021 7:08 AM

## 2021-04-20 NOTE — Progress Notes (Signed)
PROGRESS NOTE    Gabriela [name]  KDT:[id number] DOB: 1932/07/20 DOA: 04/01/2021 PCP: [name], MD     Brief Narrative:  Gabriela [name] is a 87 y.o. female with medical history significant for hypertension, TIA, stroke, CAD, hypothyroidism, depression, anxiety, hearing impairment, hiatal hernia, atrial fibrillation on Coumadin, breast cancer, bladder cancer, chronic anemia.  She presented to the hospital with abdominal pain and constipation.  She was found to have large bowel obstruction secondary to colonic mass concerning for malignancy.  She was treated with IV fluids and analgesics.  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 She required TPN postoperatively.  She underwent ileostomy revision on 04/15/2021.  New events last 24 hours / Subjective: Having breakfast, looks like appetite has returned somewhat.  Discussed with daughter at bedside.  When patient and daughter were discussing patient's medical treatments, RN had brought up options for palliative care consultation.  This seems like a good idea as patient does not wish to follow-up with oncology or enroll in any sort of chemotherapy treatments for her adenocarcinoma.  I discussed with them that palliative care can consult in the hospital and can also follow-up as outpatient for goals of care conversation.  Assessment & Plan:   Principal Problem:   Large bowel obstruction (HCC) Active Problems:   Abdominal pain   Hypertension   Hypothyroidism   Stroke ([place])   Hypokalemia   Depression with anxiety   Atrial fibrillation, chronic (HCC)   Normocytic anemia   Large bowel obstruction, invasive moderately differentiated adenocarcinoma, ileus -S/p robotic assisted subtotal colectomy with end ileostomy on 04/06/2021 -S/p ileostomy revision 04/15/2021 -Patient plans to follow up with outpatient oncology, voiced she was not interested in any chemotherapy even if offered  -Remains on TPN,  advanced to soft diet  -General surgery following  Acute hypoxemic respiratory failure -Back on 1 L oxygen, continue to wean   Chronic atrial fibrillation and history of stroke -CHA2DS2-VASc score is 6. -Coumadin per pharmacy, IV heparin to bridge  Hypertension -Continue Norvasc, Avapro  Hypothyroidism -Continue Synthroid  Depression/anxiety -Continue Zoloft, Xanax as needed  Thrush -Nystatin x 7 days  Dysuria and urinary incontinence -UA negative for UTI -Oxybutynin as needed for bladder spasm  DVT prophylaxis:  SCDs Start: 04/01/21 1207  Code Status:     Code Status Orders  (From admission, onward)           Start     Ordered   04/06/21 2319  Do not attempt resuscitation (DNR)  Continuous       Question Answer Comment  In the event of cardiac or respiratory ARREST Do not call a "code blue"   In the event of cardiac or respiratory ARREST Do not perform Intubation, CPR, defibrillation or ACLS   In the event of cardiac or respiratory ARREST Use medication by any route, position, wound care, and other measures to relive pain and suffering. May use oxygen, suction and manual treatment of airway obstruction as needed for comfort.      04/06/21 2318           Code Status History     Date Active Date Inactive Code Status Order ID Comments User Context   04/01/2021 1321 04/06/2021 2318 DNR [id number]  Ivor Costa, MD ED   04/01/2021 1207 04/01/2021 1321 Full Code [id number]  Ivor Costa, MD ED      Family Communication: Daughter at bedside Disposition Plan:  Status is: Inpatient  Remains inpatient appropriate because:IV  treatments appropriate due to intensity of illness or inability to take PO and Inpatient level of care appropriate due to severity of illness  Dispo: The patient is from: Home              Anticipated d/c is to: SNF              Patient currently is not medically stable to d/c.  Remains on TPN, soft diet   Difficult to place patient  No      Consultants:  General surgery  Palliative care   Procedures:  Robotic assisted subtotal colectomy with end ileostomy on 04/06/2021 Ileostomy revision 04/15/2021  Antimicrobials:  Anti-infectives (From admission, onward)    Start     Dose/Rate Route Frequency Ordered Stop   04/15/21 1622  sodium chloride 0.9 % with cefoTEtan (CEFOTAN) ADS Med       Note to Pharmacy: Norton Blizzard  : cabinet override      04/15/21 1622 04/15/21 1625   04/06/21 1320  sodium chloride 0.9 % with cefoTEtan (CEFOTAN) ADS Med       Note to Pharmacy: Maryagnes Amos   : cabinet override      04/06/21 1320 04/06/21 1457   04/06/21 0830  cefoTEtan (CEFOTAN) 2 g in sodium chloride 0.9 % 100 mL IVPB        2 g 200 mL/hr over 30 Minutes Intravenous On call to O.R. 04/06/21 0740 04/06/21 1452        Objective: Vitals:   04/19/21 1532 04/19/21 2036 04/20/21 0505 04/20/21 0747  BP: 135/61 132/69 (!) 128/53 (!) 143/74  Pulse: 70 72 69 76  Resp: (!) 24 18 17 20   Temp: 97.8 F (36.6 C) 98 F (36.7 C) 97.9 F (36.6 C) 98.6 F (37 C)  TempSrc: Oral  Oral Oral  SpO2: 97% 97% 99% 96%  Weight:      Height:        Intake/Output Summary (Last 24 hours) at 04/20/2021 1025 Last data filed at 04/20/2021 0929 Gross per 24 hour  Intake 2580.41 ml  Output 2100 ml  Net 480.41 ml    Filed Weights   04/16/21 0500 04/18/21 0500 04/19/21 0531  Weight: 75.2 kg 77.5 kg 77.1 kg    Examination: General exam: Appears calm and comfortable  Respiratory system: Clear to auscultation. Respiratory effort normal. Cardiovascular system: S1 & S2 heard. No pedal edema. Gastrointestinal system: Abdomen is nondistended, soft and nontender. Normal bowel sounds heard. +Ileostomy with soft brown stool  Central nervous system: Alert and oriented. Non focal exam. Speech clear  Extremities: Symmetric in appearance bilaterally  Skin: No rashes, lesions or ulcers on exposed skin  Psychiatry: Judgement and insight  appear stable. Mood & affect appropriate.     Data Reviewed: I have personally reviewed following labs and imaging studies  CBC: Recent Labs  Lab 04/15/21 0606 04/16/21 0501 04/17/21 0255 04/18/21 0525 04/19/21 0419 04/20/21 0445  WBC 26.1* 23.2* 22.7* 24.9* 20.5* 19.4*  NEUTROABS 23.1*  --   --   --   --   --   HGB 8.2* 8.2* 8.0* 7.9* 7.2* 7.1*  HCT 26.5* 27.0* 25.9* 25.5* 22.9* 23.1*  MCV 83.1 83.6 84.9 84.4 83.0 88.5  PLT 498* 503* 440* 471* 460* 478*    Basic Metabolic Panel: Recent Labs  Lab 04/14/21 0842 04/15/21 0606 04/16/21 0501 04/17/21 0255 04/18/21 0525 04/19/21 0419 04/20/21 0659  NA 139 139 133* 135 137  139 136 133*  K 3.2* 3.3* 3.2*  3.4* 4.1  4.1 4.3 4.3  CL 104 104 99 102 100  102 102 101  CO2 28 31 26 27 29  29 28 24   GLUCOSE 134* 149* 148* 157* 134*  136* 124* 117*  BUN 17 15 17 19  25*  26* 26* 18  CREATININE 0.42* 0.41* 0.44 0.41* 0.39*  0.42* 0.42* 0.32*  CALCIUM 7.8* 7.7* 7.8* 7.6* 8.2*  8.3* 7.7* 7.9*  MG 2.1 2.1 2.4 2.3 2.4  --  2.1  PHOS 2.8  --  3.1 3.0 3.8  --  3.7    GFR: Estimated Creatinine Clearance: 48.7 mL/min (A) (by C-G formula based on SCr of 0.32 mg/dL (L)). Liver Function Tests: Recent Labs  Lab 04/15/21 0606 04/17/21 0255 04/18/21 0525 04/20/21 0659  AST 16  --   --  16  ALT 13  --   --  11  ALKPHOS 63  --   --  79  BILITOT 0.5  --   --  0.4  PROT 5.0*  --   --  5.1*  ALBUMIN 2.0* 1.7* 1.8* 1.8*    No results for input(s): LIPASE, AMYLASE in the last 168 hours. No results for input(s): AMMONIA in the last 168 hours. Coagulation Profile: Recent Labs  Lab 04/16/21 0501 04/17/21 0255 04/18/21 0525 04/19/21 0419 04/20/21 0445  INR 1.4* 1.4* 1.4* 1.6* 2.1*    Cardiac Enzymes: No results for input(s): CKTOTAL, CKMB, CKMBINDEX, TROPONINI in the last 168 hours. BNP (last 3 results) No results for input(s): PROBNP in the last 8760 hours. HbA1C: No results for input(s): HGBA1C in the last 72  hours. CBG: Recent Labs  Lab 04/19/21 0748 04/19/21 1152 04/19/21 1802 04/19/21 2341 04/20/21 0636  GLUCAP 119* 128* 110* 131* 114*    Lipid Profile: Recent Labs    04/20/21 0659  TRIG 77    Thyroid Function Tests: No results for input(s): TSH, T4TOTAL, FREET4, T3FREE, THYROIDAB in the last 72 hours. Anemia Panel: No results for input(s): VITAMINB12, FOLATE, FERRITIN, TIBC, IRON, RETICCTPCT in the last 72 hours. Sepsis Labs: No results for input(s): PROCALCITON, LATICACIDVEN in the last 168 hours.   Recent Results (from the past 240 hour(s))  Urine Culture     Status: Abnormal   Collection Time: 04/18/21  3:17 PM   Specimen: Urine, Clean Catch  Result Value Ref Range Status   Specimen Description   Final    URINE, CLEAN CATCH Performed at [facility], [address] [place], [place] [address]    Special Requests   Final    NONE Performed at [facility], [address] [place], [place] [address]    Culture MULTIPLE SPECIES PRESENT, SUGGEST RECOLLECTION (A)  Final   Report Status 04/20/2021 FINAL  Final      Radiology Studies: No results found.    Scheduled Meds:  amLODipine  5 mg Oral Daily   Chlorhexidine Gluconate Cloth  6 each Topical Daily   feeding supplement  237 mL Oral TID BM   insulin aspart  0-9 Units Subcutaneous Q6H   irbesartan  300 mg Oral Daily   levothyroxine  112 mcg Oral QAC breakfast   nystatin  5 mL Oral QID   pantoprazole (PROTONIX) IV  40 mg Intravenous Daily   sertraline  50 mg Oral QHS   sodium chloride flush  10-40 mL Intracatheter Q12H   Warfarin - Pharmacist Dosing Inpatient   Does not apply q1600   Continuous Infusions:  sodium chloride 1,000 mL (04/17/21 0622)  heparin 1,950 Units/hr (04/20/21 0452)   TPN ADULT (ION) 75 mL/hr at 04/20/21 0452     LOS: 19 days      Time spent: 20 minutes   Dessa Phi, DO [facility] 04/20/2021, 10:25 AM   Available via Epic secure chat  7am-7pm After these hours, please refer to coverage provider listed on [email or web address]

## 2021-04-20 NOTE — TOC Progression Note (Signed)
Transition of Care [facility]) - Progression Note    Patient Details  Name: Gabriela [name] MRN: [id number] Date of Birth: 1931/11/24  Transition of Care [facility]) CM/SW Contact  Gabriela Sessions, RN Phone Number: 04/20/2021, 4:03 PM  Clinical Narrative:      Per rounds plan to start TPN wean.  Gabriela [name] with [facility] to review to see if they can still offer after TPN has been discontinued - Message left with [facility] to see if they can still offer   -  Gabriela [name] with [facility] [place] to review to see if can offer  Expected Discharge Plan: [place] Barriers to Discharge: Continued Medical Work up  Expected Discharge Plan and Services Expected Discharge Plan: [place] Choice: No Name arrangements for the past 2 months: Single Family Home                                       Social Determinants of Health (SDOH) Interventions    Readmission Risk Interventions No flowsheet data found.

## 2021-04-21 DIAGNOSIS — K56609 Unspecified intestinal obstruction, unspecified as to partial versus complete obstruction: Secondary | ICD-10-CM | POA: Diagnosis not present

## 2021-04-21 DIAGNOSIS — Z7189 Other specified counseling: Secondary | ICD-10-CM | POA: Diagnosis not present

## 2021-04-21 LAB — PROTIME-INR
INR: 1.8 — ABNORMAL HIGH (ref 0.8–1.2)
Prothrombin Time: 20.9 seconds — ABNORMAL HIGH (ref 11.4–15.2)

## 2021-04-21 LAB — BASIC METABOLIC PANEL
Anion gap: 9 (ref 5–15)
BUN: 15 mg/dL (ref 8–23)
CO2: 26 mmol/L (ref 22–32)
Calcium: 8 mg/dL — ABNORMAL LOW (ref 8.9–10.3)
Chloride: 99 mmol/L (ref 98–111)
Creatinine, Ser: 0.32 mg/dL — ABNORMAL LOW (ref 0.44–1.00)
GFR, Estimated: >60 mL/min (ref >60)
Glucose, Bld: 117 mg/dL — ABNORMAL HIGH (ref 70–99)
Potassium: 4.1 mmol/L (ref 3.5–5.1)
Sodium: 134 mmol/L — ABNORMAL LOW (ref 135–145)

## 2021-04-21 LAB — CBC
HCT: 24.6 % — ABNORMAL LOW (ref 36.0–46.0)
Hemoglobin: 7.4 g/dL — ABNORMAL LOW (ref 12.0–15.0)
MCH: 25 pg — ABNORMAL LOW (ref 26.0–34.0)
MCHC: 30.1 g/dL (ref 30.0–36.0)
MCV: 83.1 fL (ref 80.0–100.0)
Platelets: 524 10*3/uL — ABNORMAL HIGH (ref 150–400)
RBC: 2.96 MIL/uL — ABNORMAL LOW (ref 3.87–5.11)
RDW: 16.8 % — ABNORMAL HIGH (ref 11.5–15.5)
WBC: 18.3 10*3/uL — ABNORMAL HIGH (ref 4.0–10.5)
nRBC: 0.1 % (ref 0.0–0.2)

## 2021-04-21 LAB — GLUCOSE, CAPILLARY
Glucose-Capillary: 118 mg/dL — ABNORMAL HIGH (ref 70–99)
Glucose-Capillary: 127 mg/dL — ABNORMAL HIGH (ref 70–99)
Glucose-Capillary: 123 mg/dL — ABNORMAL HIGH (ref 70–99)
Glucose-Capillary: 132 mg/dL — ABNORMAL HIGH (ref 70–99)

## 2021-04-21 MED ORDER — WARFARIN SODIUM 5 MG PO TABS
5.0000 mg | ORAL_TABLET | Freq: Once | ORAL | Status: AC
  Administered 2021-04-21: 5 mg via ORAL
  Filled 2021-04-21: qty 1

## 2021-04-21 NOTE — TOC Progression Note (Signed)
Transition of Care [facility]) - Progression Note    Patient Details  Name: Gabriela [name] MRN: [id number] Date of Birth: March 08, 1932  Transition of Care [facility]) CM/SW Contact  Beverly Sessions, RN Phone Number: 04/21/2021, 3:29 PM  Clinical Narrative:     Josem Kaufmann approved for [facility] Auth # [id number] Daughter updated   Expected Discharge Plan: [place] Barriers to Discharge: Continued Medical Work up  Expected Discharge Plan and Services Expected Discharge Plan: [place] Choice: [place] arrangements for the past 2 months: Single Family Home                                       Social Determinants of Health (SDOH) Interventions    Readmission Risk Interventions No flowsheet data found.

## 2021-04-21 NOTE — Care Management Important Message (Signed)
Important Message  Patient Details  Name: Gabriela [name] MRN: [id number] Date of Birth: [birth year]   Medicare Important Message Given:  Yes     [name] 04/21/2021, 2:43 PM

## 2021-04-21 NOTE — TOC Progression Note (Signed)
Transition of Care [facility]) - Progression Note    Patient Details  Name: Gabriela [name] MRN: [id number] Date of Birth: 1932/02/02  Transition of Care [facility]) CM/SW Contact  Beverly Sessions, RN Phone Number: 04/21/2021, 10:25 AM  Clinical Narrative:     - [facility]- can not offer - [facility] - still reviewing - [facility] - can still offer.   Daughter updated    Expected Discharge Plan: [place] Barriers to Discharge: Continued Medical Work up  Expected Discharge Plan and Services Expected Discharge Plan: [place] Choice: [place] arrangements for the past 2 months: Single Family Home                                       Social Determinants of Health (SDOH) Interventions    Readmission Risk Interventions No flowsheet data found.

## 2021-04-21 NOTE — Progress Notes (Signed)
[place] Hospital Day(s): 20.   Post op day(s): 6 Days Post-Op.   Interval History:  Patient seen and examined No acute events or new complaints overnight.  Patient reports she is feeling better and making good improvements No fever, chills, nausea, emesis Leukocytosis continues to make small improvements; down to 18.3K Hgb is down trending but stable; 7.4 Renal function remains normal; sCr - 0.32; UO - 1600 ccs No electrolyte derangements INR 1.8 this morning She is on regular diet + TPN at 1/2 rate Ileostomy with 410 + unmeasured out in last 24 hours Working with therapies; pending SNF  Vital signs in last 24 hours: [min-max] current  Temp:  [97.8 F (36.6 C)-98.6 F (37 C)] 98.3 F (36.8 C) (09/27 0422) Pulse Rate:  [64-77] 64 (09/27 0422) Resp:  [16-20] 16 (09/27 0422) BP: (130-143)/(59-74) 140/62 (09/27 0422) SpO2:  [94 %-98 %] 98 % (09/27 0422) Weight:  [74.3 kg] 74.3 kg (09/27 0500)     Height: 5\' 3"  (160 cm) Weight: 74.3 kg BMI (Calculated): 29.02   Intake/Output last 2 shifts:  09/26 0701 - 09/27 0700 In: 2292.8 [P.O.:540; I.V.:1752.8] Out: 2010 [Urine:1600; Stool:410]   Physical Exam:  Constitutional: alert, cooperative and no distress  Respiratory: breathing non-labored at rest  Cardiovascular: regular rate and sinus rhythm  Gastrointestinal:  Soft, non-tender, less distended, she is not peritonitic. Ileostomy in the right abdomen, pink, there is some liquid stool in bag, + flatus Integumentary: Laparoscopic incisions are CDI with dermabond, no erythema or drainage   Labs:  CBC Latest Ref Rng & Units 04/21/2021 04/20/2021 04/19/2021  WBC 4.0 - 10.5 K/uL 18.3(H) 19.4(H) 20.5(H)  Hemoglobin 12.0 - 15.0 g/dL 7.4(L) 7.1(L) 7.2(L)  Hematocrit 36.0 - 46.0 % 24.6(L) 23.1(L) 22.9(L)  Platelets 150 - 400 K/uL 524(H) 478(H) 460(H)   CMP Latest Ref Rng & Units 04/21/2021 04/20/2021 04/19/2021  Glucose 70 - 99 mg/dL 117(H)  117(H) 124(H)  BUN 8 - 23 mg/dL 15 18 26(H)  Creatinine 0.44 - 1.00 mg/dL 0.32(L) 0.32(L) 0.42(L)  Sodium 135 - 145 mmol/L 134(L) 133(L) 136  Potassium 3.5 - 5.1 mmol/L 4.1 4.3 4.3  Chloride 98 - 111 mmol/L 99 101 102  CO2 22 - 32 mmol/L 26 24 28   Calcium 8.9 - 10.3 mg/dL 8.0(L) 7.9(L) 7.7(L)  Total Protein 6.5 - 8.1 g/dL - 5.1(L) -  Total Bilirubin 0.3 - 1.2 mg/dL - 0.4 -  Alkaline Phos 38 - 126 U/L - 79 -  AST 15 - 41 U/L - 16 -  ALT 0 - 44 U/L - 11 -     Imaging studies: No new pertinent imaging studies   Assessment/Plan:  87 y.o. female 6 Days Post-Op s/p ileostomy revision s/p robotic assisted laparoscopic subtotal colectomy with end ileostomy for obstructing transverse colon mass on 09/12.   - Continue regular diet + Nutritional supplementation             - Discontinue TPN today after completion of current bag  - Monitor abdominal examination; on-going ileostomy function, will improve with nutrition. May need to start imodium in next 24-48 hours pending output - Appreciate WOC RN for ileostomy teaching              - Monitor leukocytosis; improved; uncertain etiology at this point.               - Early mobilization encouraged, worked with therapies; recommending SNF             -  Further management per primary service; we will follow             - Reviewed pathology & tumor board: T2N0 Adenocarcinoma; refer to oncology as outpatient    - Discharge Planning; I believe we are getting closer to discharge, will stop TPN today, monitor ileostomy output to determine need for antidiarrheal, also pending placement, anticipate ready for DC in next 24 hours or so  All of the above findings and recommendations were discussed with the patient, patient's family (daughter at bedside), and the medical team, and all of patient's and family's questions were answered to their expressed satisfaction.  -- Edison Simon, PA-C [facility] 04/21/2021, 7:19  AM [phone] M-F: 7am - 4pm

## 2021-04-21 NOTE — Progress Notes (Signed)
[place] for warfarin  Indication: atrial fibrillation  Patient Measurements: Height: 5\' 3"  (160 cm) Weight: 74.3 kg (163 lb 12.8 oz) IBW/kg (Calculated) : 52.4  Vital Signs: Temp: 98.3 F (36.8 C) (09/27 0422) Temp Source: Oral (09/26 2002) BP: 140/62 (09/27 0422) Pulse Rate: 64 (09/27 0422)  Labs: Recent Labs    04/19/21 0419 04/19/21 1057 04/19/21 1950 04/20/21 0445 04/20/21 0659 04/21/21 0533  HGB 7.2*  --   --  7.1*  --  7.4*  HCT 22.9*  --   --  23.1*  --  24.6*  PLT 460*  --   --  478*  --  524*  LABPROT 19.3*  --   --  23.7*  --  20.9*  INR 1.6*  --   --  2.1*  --  1.8*  HEPARINUNFRC  --  0.27* 0.77* 0.50  --   --   CREATININE 0.42*  --   --   --  0.32* 0.32*     Estimated Creatinine Clearance: 47.9 mL/min (A) (by C-G formula based on SCr of 0.32 mg/dL (L)).   Assessment: 87 y.o. female with medical history significant of hypertension, stroke, TIA, GERD, hypothyroidism, depression with anxiety, hard of hearing, hiatal hernia, atrial fibrillation on Coumadin, breast cancer, bladder cancer, anemia, who presents with abdominal pain and constipation.   Pharmacy has been consulted for restarting warfarin.    PTA warfarin regimen: 3 mg on Sat/Sun/Mon/Tue; 4 mg on Wed/Thur/Fri   Goal of Therapy:  INR 2-3 Monitor platelets by anticoagulation protocol: Yes    Date   INR   Warfarin Dose  9/14    1.4   5 mg 9/15    1.6   5 mg 9/16    2.4   2 mg 9/17    2.9   HOLD  9/18    3.3   1 mg  9/19    3.6   HOLD   9/20    3.8   HOLD  9/21    3.5   HOLD / 5 mg IV vit K 9/22    1.4   4 mg 9/23    1.4   4 mg  9/24   1.4   5 mg  9/25   1.6   5 mg  9/26   2.1   3 mg  9/27   1.8   5 mg     Plan:   Warfarin INR is again subtherapeutic warfarin 5 mg today (equal to approximately 50 % more than average home dose) Follow up INR with morning labs.  Vallery Sa, PharmD

## 2021-04-21 NOTE — TOC Progression Note (Signed)
Transition of Care [facility]) - Progression Note    Patient Details  Name: Gabriela [name] MRN: [id number] Date of Birth: [birth year]  Transition of Care [facility]) CM/SW Contact  Beverly Sessions, RN Phone Number: 04/21/2021, 1:59 PM  Clinical Narrative:     Still no determination from [facility] if they can offer a bed   VM left for daughter to update that out of [facility] top 3 facilities [facility] one that has confirmed they can offer a bed   Expected Discharge Plan: [place] Barriers to Discharge: Continued Medical Work up  Expected Discharge Plan and Services Expected Discharge Plan: [place] Choice: [place] arrangements for the past 2 months: Single Family Home                                       Social Determinants of Health (SDOH) Interventions    Readmission Risk Interventions No flowsheet data found.

## 2021-04-21 NOTE — TOC Progression Note (Signed)
Transition of Care [facility]) - Progression Note    Patient Details  Name: Gabriela [name] MRN: [id number] Date of Birth: 05-29-1932  Transition of Care [facility]) CM/SW Contact  Beverly Sessions, RN Phone Number: 04/21/2021, 3:05 PM  Clinical Narrative:     D[facility] accepted [facility] at [facility] notified and [facility] in [place] started by [facility] green in [place] portal   Expected Discharge Plan: [place] Barriers to Discharge: Continued Medical Work up  Expected Discharge Plan and Services Expected Discharge Plan: [place] Choice: [place] arrangements for the past 2 months: Single Family Home                                       Social Determinants of Health (SDOH) Interventions    Readmission Risk Interventions No flowsheet data found.

## 2021-04-21 NOTE — Consult Note (Signed)
Consultation Note Date: 04/21/2021   Patient Name: Gabriela [name]  DOB: 1932/07/09  MRN: [id number]  Age / Sex: 87 y.o., female  PCP: [name], MD Referring Physician: Dessa Phi, DO  Reason for Consultation: Establishing goals of care  HPI/Patient Profile: Gabriela [name] is a 87 y.o. female with medical history significant for hypertension, TIA, stroke, CAD, hypothyroidism, depression, anxiety, hearing impairment, hiatal hernia, atrial fibrillation on Coumadin, breast cancer, bladder cancer, chronic anemia.  She presented to the hospital with abdominal pain and constipation.  She was found to have large bowel obstruction secondary to colonic mass concerning for malignancy.  She was treated with IV fluids and analgesics.  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 She required TPN postoperatively.  She underwent ileostomy revision on 04/15/2021.  Clinical Assessment and Goals of Care: Patient is resting in bed. She is widowed and has children. She lives alone and is fully independent. She states she mows her yard and her neighbor's yard, and drives herself where she needs to go.   We discussed her diagnoses, prognosis, GOC, EOL wishes disposition and options.  Created space and opportunity for patient  to explore thoughts and feelings regarding current medical information.   A detailed discussion was had today regarding advanced directives.  Concepts specific to code status, artifical feeding and hydration, IV antibiotics and rehospitalization were discussed.  The difference between an aggressive medical intervention path and a comfort care path was discussed.  Values and goals of care important to patient and family were attempted to be elicited.  Discussed limitations of medical interventions to prolong quality of life in some situations and discussed the  concept of human mortality.  She has been well updates and articulates her status correctly. She states she will take one day at a time. She is not happy about having cancer recurrence or a colostomy bag, but states "this is just part of it." She states the only limit to care at this time is the currently established DNR status.     SUMMARY OF RECOMMENDATIONS   Recommend outpatient oncology palliative follow up with Billey Chang NP at the cancer center.        Primary Diagnoses: Present on Admission:  Abdominal pain  Hypertension  Hypothyroidism  Stroke ([place])  Hypokalemia  Depression with anxiety  Atrial fibrillation, chronic (HCC)  Normocytic anemia  Large bowel obstruction ([place])   I have reviewed the medical record, interviewed the patient and family, and examined the patient. The following aspects are pertinent.  Past Medical History:  Diagnosis Date   Acid reflux    Anxiety    Arrhythmia    Bladder cancer ([place])    Breast cancer ([place])    Cancer ([place])    breast,bladder   Depression    Dysrhythmia    afib   GERD (gastroesophageal reflux disease)    History of hiatal hernia    HOH (hard of hearing)    aids   Hypertension    Hyperthyroidism  Hypothyroidism    Stroke [facility])    2010   TIA (transient ischemic attack)    Social History   Socioeconomic History   Marital status: Widowed    Spouse name: Not on file   Number of children: Not on file   Years of education: Not on file   Highest education level: Not on file  Occupational History   Not on file  Tobacco Use   Smoking status: Never   Smokeless tobacco: Never  Substance and Sexual Activity   Alcohol use: No   Drug use: Never   Sexual activity: Not on file  Other Topics Concern   Not on file  Social History Narrative   Not on file   Social Determinants of Health   Financial Resource Strain: Not on file  Food Insecurity: Not on file  Transportation Needs: Not on file  Physical Activity: Not  on file  Stress: Not on file  Social Connections: Not on file   Family History  Adopted: Yes   Scheduled Meds:  amLODipine  5 mg Oral Daily   Chlorhexidine Gluconate Cloth  6 each Topical Daily   feeding supplement  237 mL Oral TID BM   insulin aspart  0-9 Units Subcutaneous Q6H   irbesartan  300 mg Oral Daily   levothyroxine  112 mcg Oral QAC breakfast   nystatin  5 mL Oral QID   pantoprazole (PROTONIX) IV  40 mg Intravenous Daily   sertraline  50 mg Oral QHS   sodium chloride flush  10-40 mL Intracatheter Q12H   warfarin  5 mg Oral ONCE-1600   Warfarin - Pharmacist Dosing Inpatient   Does not apply q1600   Continuous Infusions:  sodium chloride 1,000 mL (04/17/21 0622)   TPN ADULT (ION) 40 mL/hr at 04/21/21 1247   PRN Meds:.sodium chloride, acetaminophen, albuterol, ALPRAZolam, alum & mag hydroxide-simeth, meclizine, ondansetron (ZOFRAN) IV, oxybutynin, phenol, sodium chloride flush, traMADol Medications Prior to Admission:  Prior to Admission medications   Medication Sig Start Date End Date Taking? Authorizing Provider  albuterol (VENTOLIN HFA) 108 (90 Base) MCG/ACT inhaler 2 puffs q.i.d. p.r.n. short of breath, wheezing, or cough 01/19/20  Yes [provider]  ALPRAZolam (XANAX) 0.25 MG tablet Take 0.125 mg by mouth at bedtime as needed for anxiety.   Yes [provider]  amLODipine (NORVASC) 5 MG tablet Take 5 mg by mouth daily. 01/12/21  Yes [provider]  levothyroxine (SYNTHROID, LEVOTHROID) 112 MCG tablet Take 112 mcg by mouth daily before breakfast.   Yes [provider]  pantoprazole (PROTONIX) 40 MG tablet Take 40 mg by mouth 2 (two) times daily.   Yes [provider]  sertraline (ZOLOFT) 50 MG tablet Take 50 mg by mouth daily. hs   Yes [provider]  valsartan (DIOVAN) 320 MG tablet Take 320 mg by mouth daily.   Yes [provider]  warfarin (COUMADIN) 3 MG tablet Take 3 mg by mouth daily. Sat sun mon  tues   Yes [provider]  warfarin (COUMADIN) 4 MG tablet Take 4 mg by mouth daily. Wed thurs Dentist, Historical, MD   Allergies  Allergen Reactions   Celebrex [Celecoxib] Anaphylaxis   Morphine And Related Swelling    Lips and mouth   Povidone-Iodine Other (See Comments)    Other reaction(s): ITCHING Other reaction(s): ITCHING    Sulfa Antibiotics    Review of Systems  Constitutional:  Positive for appetite change.   Physical Exam  Pulmonary:     Effort: Pulmonary effort is normal.  Neurological:     Mental Status: She is alert.    Vital Signs: BP (!) 150/58 (BP Location: Right Arm) Comment: Will recheck later, [name] RN notified  Pulse 74   Temp 98.2 F (36.8 C) (Oral)   Resp 20   Ht 5\' 3"  (1.6 m)   Wt 74.3 kg   LMP  (LMP Unknown)   SpO2 99%   BMI 29.02 kg/m  Pain Scale: 0-10 POSS *See Group Information*: S-Acceptable,Sleep, easy to arouse Pain Score: 0-No pain   SpO2: SpO2: 99 % O2 Device:SpO2: 99 % O2 Flow Rate: .O2 Flow Rate (L/min): 1 L/min  IO: Intake/output summary:  Intake/Output Summary (Last 24 hours) at 04/21/2021 1448 Last data filed at 04/21/2021 1300 Gross per 24 hour  Intake 1697.94 ml  Output 2000 ml  Net -302.06 ml    LBM: Last BM Date: 04/20/21 (Ileostomy) Baseline Weight: Weight: 64.9 kg Most recent weight: Weight: 74.3 kg       Time In: 1:10 Time Out: 2:00 Time Total: 50 min Greater than 50%  of this time was spent counseling and coordinating care related to the above assessment and plan.  Signed by: Asencion Gowda, NP   Please contact Palliative Medicine Team phone at [phone] for questions and concerns.  For individual provider: See Shea Evans

## 2021-04-21 NOTE — Progress Notes (Signed)
PROGRESS NOTE    Gabriela [name]  LNL:[id number] DOB: July 29, 1931 DOA: 04/01/2021 PCP: [name], MD     Brief Narrative:  Gabriela [name] is a 87 y.o. female with medical history significant for hypertension, TIA, stroke, CAD, hypothyroidism, depression, anxiety, hearing impairment, hiatal hernia, atrial fibrillation on Coumadin, breast cancer, bladder cancer, chronic anemia.  She presented to the hospital with abdominal pain and constipation.  She was found to have large bowel obstruction secondary to colonic mass concerning for malignancy.  She was treated with IV fluids and analgesics.  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 She required TPN postoperatively.  She underwent ileostomy revision on 04/15/2021.  New events last 24 hours / Subjective: Still has no appetite but with encouragement has been tolerating soft diet.  TPN to be weaned off today.  Awaiting SNF placement.  Assessment & Plan:   Principal Problem:   Large bowel obstruction (HCC) Active Problems:   Abdominal pain   Hypertension   Hypothyroidism   Stroke ([place])   Hypokalemia   Depression with anxiety   Atrial fibrillation, chronic (HCC)   Normocytic anemia   Large bowel obstruction, invasive moderately differentiated adenocarcinoma, ileus -S/p robotic assisted subtotal colectomy with end ileostomy on 04/06/2021 -S/p ileostomy revision 04/15/2021 -Patient plans to follow up with outpatient oncology, voiced she was not interested in any chemotherapy even if offered.  Palliative care consulted -Weaning off TPN by the afternoon today -General surgery following  Acute hypoxemic respiratory failure -Continue to wean off oxygen   Chronic atrial fibrillation and history of stroke -CHA2DS2-VASc score is 6. -Coumadin per pharmacy, IV heparin to bridge  Hypertension -Continue Norvasc, Avapro  Hypothyroidism -Continue Synthroid  Depression/anxiety -Continue Zoloft,  Xanax as needed  Thrush -Nystatin x 7 days  Dysuria and urinary incontinence -UA negative for UTI -Oxybutynin as needed for bladder spasm  DVT prophylaxis:  SCDs Start: 04/01/21 1207  Code Status:     Code Status Orders  (From admission, onward)           Start     Ordered   04/06/21 2319  Do not attempt resuscitation (DNR)  Continuous       Question Answer Comment  In the event of cardiac or respiratory ARREST Do not call a "code blue"   In the event of cardiac or respiratory ARREST Do not perform Intubation, CPR, defibrillation or ACLS   In the event of cardiac or respiratory ARREST Use medication by any route, position, wound care, and other measures to relive pain and suffering. May use oxygen, suction and manual treatment of airway obstruction as needed for comfort.      04/06/21 2318           Code Status History     Date Active Date Inactive Code Status Order ID Comments User Context   04/01/2021 1321 04/06/2021 2318 DNR [id number]  Ivor Costa, MD ED   04/01/2021 1207 04/01/2021 1321 Full Code [id number]  Ivor Costa, MD ED      Family Communication: Daughter at bedside Disposition Plan:  Status is: Inpatient  Remains inpatient appropriate because:IV treatments appropriate due to intensity of illness or inability to take PO and Inpatient level of care appropriate due to severity of illness  Dispo: The patient is from: Home              Anticipated d/c is to: SNF              Patient  currently is not medically stable to d/c.  Weaning off TPN today.  SNF placement is pending   Difficult to place patient No      Consultants:  General surgery  Palliative care   Procedures:  Robotic assisted subtotal colectomy with end ileostomy on 04/06/2021 Ileostomy revision 04/15/2021  Antimicrobials:  Anti-infectives (From admission, onward)    Start     Dose/Rate Route Frequency Ordered Stop   04/15/21 1622  sodium chloride 0.9 % with cefoTEtan (CEFOTAN) ADS Med        Note to Pharmacy: Norton Blizzard  : cabinet override      04/15/21 1622 04/15/21 1625   04/06/21 1320  sodium chloride 0.9 % with cefoTEtan (CEFOTAN) ADS Med       Note to Pharmacy: Maryagnes Amos   : cabinet override      04/06/21 1320 04/06/21 1457   04/06/21 0830  cefoTEtan (CEFOTAN) 2 g in sodium chloride 0.9 % 100 mL IVPB        2 g 200 mL/hr over 30 Minutes Intravenous On call to O.R. 04/06/21 0740 04/06/21 1452        Objective: Vitals:   04/20/21 2002 04/21/21 0422 04/21/21 0500 04/21/21 0816  BP: (!) 131/59 140/62  (!) 135/52  Pulse: 71 64  79  Resp: 18 16  17   Temp: 97.8 F (36.6 C) 98.3 F (36.8 C)  98 F (36.7 C)  TempSrc: Oral   Oral  SpO2: 94% 98%  98%  Weight:   74.3 kg   Height:        Intake/Output Summary (Last 24 hours) at 04/21/2021 1032 Last data filed at 04/21/2021 1030 Gross per 24 hour  Intake 2262.78 ml  Output 2160 ml  Net 102.78 ml    Filed Weights   04/18/21 0500 04/19/21 0531 04/21/21 0500  Weight: 77.5 kg 77.1 kg 74.3 kg    Examination: General exam: Appears calm and comfortable  Respiratory system: Clear to auscultation. Respiratory effort normal. Cardiovascular system: S1 & S2 heard. No pedal edema. Gastrointestinal system: Abdomen is nondistended, soft and nontender. Normal bowel sounds heard. + Ileostomy with soft brown stool Central nervous system: Alert and oriented. Non focal exam. Speech clear  Extremities: Symmetric in appearance bilaterally  Skin: No rashes, lesions or ulcers on exposed skin  Psychiatry: Judgement and insight appear stable. Mood & affect appropriate.      Data Reviewed: I have personally reviewed following labs and imaging studies  CBC: Recent Labs  Lab 04/15/21 0606 04/16/21 0501 04/17/21 0255 04/18/21 0525 04/19/21 0419 04/20/21 0445 04/21/21 0533  WBC 26.1*   < > 22.7* 24.9* 20.5* 19.4* 18.3*  NEUTROABS 23.1*  --   --   --   --   --   --   HGB 8.2*   < > 8.0* 7.9* 7.2* 7.1* 7.4*   HCT 26.5*   < > 25.9* 25.5* 22.9* 23.1* 24.6*  MCV 83.1   < > 84.9 84.4 83.0 88.5 83.1  PLT 498*   < > 440* 471* 460* 478* 524*   < > = values in this interval not displayed.    Basic Metabolic Panel: Recent Labs  Lab 04/15/21 0606 04/16/21 0501 04/17/21 0255 04/18/21 0525 04/19/21 0419 04/20/21 0659 04/21/21 0533  NA 139 133* 135 137  139 136 133* 134*  K 3.3* 3.2* 3.4* 4.1  4.1 4.3 4.3 4.1  CL 104 99 102 100  102 102 101 99  CO2 31 26 27  29  29 28 24 26   GLUCOSE 149* 148* 157* 134*  136* 124* 117* 117*  BUN 15 17 19  25*  26* 26* 18 15  CREATININE 0.41* 0.44 0.41* 0.39*  0.42* 0.42* 0.32* 0.32*  CALCIUM 7.7* 7.8* 7.6* 8.2*  8.3* 7.7* 7.9* 8.0*  MG 2.1 2.4 2.3 2.4  --  2.1  --   PHOS  --  3.1 3.0 3.8  --  3.7  --     GFR: Estimated Creatinine Clearance: 47.9 mL/min (A) (by C-G formula based on SCr of 0.32 mg/dL (L)). Liver Function Tests: Recent Labs  Lab 04/15/21 0606 04/17/21 0255 04/18/21 0525 04/20/21 0659  AST 16  --   --  16  ALT 13  --   --  11  ALKPHOS 63  --   --  79  BILITOT 0.5  --   --  0.4  PROT 5.0*  --   --  5.1*  ALBUMIN 2.0* 1.7* 1.8* 1.8*    No results for input(s): LIPASE, AMYLASE in the last 168 hours. No results for input(s): AMMONIA in the last 168 hours. Coagulation Profile: Recent Labs  Lab 04/17/21 0255 04/18/21 0525 04/19/21 0419 04/20/21 0445 04/21/21 0533  INR 1.4* 1.4* 1.6* 2.1* 1.8*    Cardiac Enzymes: No results for input(s): CKTOTAL, CKMB, CKMBINDEX, TROPONINI in the last 168 hours. BNP (last 3 results) No results for input(s): PROBNP in the last 8760 hours. HbA1C: No results for input(s): HGBA1C in the last 72 hours. CBG: Recent Labs  Lab 04/20/21 1152 04/20/21 1826 04/20/21 2355 04/21/21 0541 04/21/21 0819  GLUCAP 113* 131* 107* 118* 127*    Lipid Profile: Recent Labs    04/20/21 0659  TRIG 77    Thyroid Function Tests: No results for input(s): TSH, T4TOTAL, FREET4, T3FREE, THYROIDAB in the  last 72 hours. Anemia Panel: No results for input(s): VITAMINB12, FOLATE, FERRITIN, TIBC, IRON, RETICCTPCT in the last 72 hours. Sepsis Labs: No results for input(s): PROCALCITON, LATICACIDVEN in the last 168 hours.   Recent Results (from the past 240 hour(s))  Urine Culture     Status: Abnormal   Collection Time: 04/18/21  3:17 PM   Specimen: Urine, Clean Catch  Result Value Ref Range Status   Specimen Description   Final    URINE, CLEAN CATCH Performed at [facility], [address] [place], [place] [address]    Special Requests   Final    NONE Performed at [facility], [address] [place], [place] [address]    Culture MULTIPLE SPECIES PRESENT, SUGGEST RECOLLECTION (A)  Final   Report Status 04/20/2021 FINAL  Final      Radiology Studies: No results found.    Scheduled Meds:  amLODipine  5 mg Oral Daily   Chlorhexidine Gluconate Cloth  6 each Topical Daily   feeding supplement  237 mL Oral TID BM   insulin aspart  0-9 Units Subcutaneous Q6H   irbesartan  300 mg Oral Daily   levothyroxine  112 mcg Oral QAC breakfast   nystatin  5 mL Oral QID   pantoprazole (PROTONIX) IV  40 mg Intravenous Daily   sertraline  50 mg Oral QHS   sodium chloride flush  10-40 mL Intracatheter Q12H   Warfarin - Pharmacist Dosing Inpatient   Does not apply q1600   Continuous Infusions:  sodium chloride 1,000 mL (04/17/21 0622)   TPN ADULT (ION) 40 mL/hr at 04/21/21 0310     LOS: 20 days  Time spent: 20 minutes   Dessa Phi, DO [facility] 04/21/2021, 10:32 AM   Available via Epic secure chat 7am-7pm After these hours, please refer to coverage provider listed on [email or web address]

## 2021-04-22 DIAGNOSIS — K56609 Unspecified intestinal obstruction, unspecified as to partial versus complete obstruction: Secondary | ICD-10-CM | POA: Diagnosis not present

## 2021-04-22 LAB — BASIC METABOLIC PANEL
Anion gap: 11 (ref 5–15)
BUN: 12 mg/dL (ref 8–23)
CO2: 27 mmol/L (ref 22–32)
Calcium: 8.4 mg/dL — ABNORMAL LOW (ref 8.9–10.3)
Chloride: 100 mmol/L (ref 98–111)
Creatinine, Ser: 0.61 mg/dL (ref 0.44–1.00)
GFR, Estimated: >60 mL/min (ref >60)
Glucose, Bld: 111 mg/dL — ABNORMAL HIGH (ref 70–99)
Potassium: 3.9 mmol/L (ref 3.5–5.1)
Sodium: 138 mmol/L (ref 135–145)

## 2021-04-22 LAB — CBC
HCT: 25.2 % — ABNORMAL LOW (ref 36.0–46.0)
Hemoglobin: 8.2 g/dL — ABNORMAL LOW (ref 12.0–15.0)
MCH: 26.5 pg (ref 26.0–34.0)
MCHC: 32.5 g/dL (ref 30.0–36.0)
MCV: 81.3 fL (ref 80.0–100.0)
Platelets: 552 10*3/uL — ABNORMAL HIGH (ref 150–400)
RBC: 3.1 MIL/uL — ABNORMAL LOW (ref 3.87–5.11)
RDW: 16.1 % — ABNORMAL HIGH (ref 11.5–15.5)
WBC: 17.1 10*3/uL — ABNORMAL HIGH (ref 4.0–10.5)
nRBC: 0 % (ref 0.0–0.2)

## 2021-04-22 LAB — PROTIME-INR
INR: 1.7 — ABNORMAL HIGH (ref 0.8–1.2)
Prothrombin Time: 20.3 seconds — ABNORMAL HIGH (ref 11.4–15.2)

## 2021-04-22 LAB — GLUCOSE, CAPILLARY
Glucose-Capillary: 115 mg/dL — ABNORMAL HIGH (ref 70–99)
Glucose-Capillary: 119 mg/dL — ABNORMAL HIGH (ref 70–99)

## 2021-04-22 LAB — SARS CORONAVIRUS 2 (TAT 6-24 HRS): SARS Coronavirus 2: NEGATIVE

## 2021-04-22 MED ORDER — ALPRAZOLAM 0.25 MG PO TABS
0.1250 mg | ORAL_TABLET | Freq: Every evening | ORAL | 0 refills | Status: DC | PRN
Start: 2021-04-22 — End: 2023-05-09

## 2021-04-22 MED ORDER — OXYBUTYNIN CHLORIDE 5 MG PO TABS: 5.0000 mg | ORAL_TABLET | Freq: Three times a day (TID) | ORAL | Status: DC | PRN

## 2021-04-22 MED ORDER — TRAMADOL HCL 50 MG PO TABS: 50.0000 mg | ORAL_TABLET | Freq: Four times a day (QID) | ORAL | 0 refills | Status: DC | PRN

## 2021-04-22 MED ORDER — ENSURE ENLIVE PO LIQD: 237.0000 mL | Freq: Three times a day (TID) | ORAL | 12 refills | Status: DC

## 2021-04-22 MED ORDER — NYSTATIN 100000 UNIT/ML MT SUSP: 5.0000 mL | Freq: Four times a day (QID) | OROMUCOSAL | 0 refills | Status: AC

## 2021-04-22 MED ORDER — COVID-19 MRNA VAC-TRIS(PFIZER) 30 MCG/0.3ML IM SUSP
0.3000 mL | Freq: Once | INTRAMUSCULAR | Status: DC
Start: 2021-04-23 — End: 2021-04-22

## 2021-04-22 MED ORDER — ACETAMINOPHEN 325 MG PO TABS: 650.0000 mg | ORAL_TABLET | Freq: Four times a day (QID) | ORAL | Status: DC | PRN

## 2021-04-22 NOTE — Discharge Summary (Signed)
Physician Discharge Summary   Patient name: Gabriela [name]  Admit date:     04/01/2021  Discharge date: 04/22/2021  Attending Physician: Ivor Costa [[id number]]  Discharge Physician: Edwin Dada   PCP: [name], MD    Contact information for follow-up providers     Ronny Bacon, MD. Schedule an appointment as soon as possible for a visit in 2 week(s).   Specialty: General Surgery Why: s/p subtotal colectomy and ileostomy Contact information: [address] [place] [address] [phone]         [facility]. Schedule an appointment as soon as possible for a visit in 2 week(s).   Why: follow up colon cancer s/p colectomy        Ellison Hughs Chrissie Noa, MD. Schedule an appointment as soon as possible for a visit in 1 week(s).   Specialty: Family Medicine Why: 1 week after discharge from rehab Contact information: [address] [place] [address] [phone]              Contact information for after-discharge care     Destination     [facility] .   Service: Skilled Nursing Contact information: [address] [place] [phone]                     Recommendations at discharge:  Give warfarin 5 mg tonight (Wednesday) then resume home regimen as below [place] course of oral nystatin 3. Follow up with General Surgery, Dr. Christian Mate in 2 weeks 4. Follow up with Oncology in 1-2 weeks    [name] is a 87 y.o. female with medical history significant for hypertension, TIA, stroke, CAD, hypothyroidism, depression, anxiety, hearing impairment, hiatal hernia, atrial fibrillation on Coumadin, breast cancer, bladder cancer, chronic anemia.  She presented to the hospital with abdominal pain and constipation.  She was found to have large bowel obstruction secondary to colonic mass concerning for malignancy.  She  was treated with IV fluids and analgesics.  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 She required TPN postoperatively.  She underwent ileostomy revision on 04/15/2021.     Principal discharge diagnosis: Large bowel obstruction   Discharge diagnoses: Large bowel obstruction, invasive moderately differentiated adenocarcinoma, ileus Admitted and underwent robotic assisted subtotal colectomy with end ileostomy on 04/06/2021 and then ileostomy revision 04/15/2021 both by Dr. Christian Mate.  She was was referred to Oncology, had voiced to a previous attending that she was not interested in any chemotherapy even if offered.  Palliative care consulted and discussed goals of care.    She was weaned from TPN 24 hours prior to discharge, electrolytes today normal.  Stable for discharge.       Acute hypoxemic respiratory failure ruled out   Chronic atrial fibrillation and history of stroke Given vitamin K to reverse.  Resumed warfarin post-op with heparin bridge.  INR today 1.7, risk of bleeding with continued bridging probably outweighs benefit for stroke reduction given near normal INR.  Discussed with family, who agree.   Hypertension  Hypothyroidism  Depression/anxiety   Thrush Nystatin x 7 days, two more days after discharge   Dysuria and urinary incontinence -UA negative for UTI -Oxybutynin as needed for bladder spasm       Condition at discharge: fair  Exam Physical Exam Vitals reviewed.  HENT:     Head: Normocephalic.  Eyes:  Extraocular Movements: Extraocular movements intact.  Cardiovascular:     Rate and Rhythm: Normal rate.     Heart sounds: Normal heart sounds. No murmur heard.   No gallop.  Pulmonary:     Effort: Pulmonary effort is normal. No respiratory distress.     Breath sounds: No stridor. No wheezing, rhonchi or rales.  Abdominal:     General: Abdomen is flat.     Tenderness: There is abdominal tenderness (mild,  left sided). There is no guarding or rebound.  Skin:    General: Skin is warm and dry.     Coloration: Skin is not jaundiced or pale.  Neurological:     General: No focal deficit present.     Mental Status: She is alert and oriented to person, place, and time.     Motor: No weakness.  Psychiatric:        Mood and Affect: Mood normal. Mood is not anxious or depressed.        Behavior: Behavior normal.    Disposition: Skilled nursing facility  Discharge time: greater than 30 minutes. Allergies as of 04/22/2021       Reactions   Celebrex [celecoxib] Anaphylaxis   Morphine And Related Swelling   Lips and mouth   Povidone-iodine Other (See Comments)   Other reaction(s): ITCHING Other reaction(s): ITCHING   Sulfa Antibiotics         Medication List     TAKE these medications    acetaminophen 325 MG tablet Commonly known as: TYLENOL Take 2 tablets (650 mg total) by mouth every 6 (six) hours as needed for mild pain or fever.   albuterol 108 (90 Base) MCG/ACT inhaler Commonly known as: VENTOLIN HFA 2 puffs q.i.d. p.r.n. short of breath, wheezing, or cough   ALPRAZolam 0.25 MG tablet Commonly known as: XANAX Take 0.5 tablets (0.125 mg total) by mouth at bedtime as needed for anxiety.   amLODipine 5 MG tablet Commonly known as: NORVASC Take 5 mg by mouth daily.   feeding supplement Liqd Take 237 mLs by mouth 3 (three) times daily between meals.   levothyroxine 112 MCG tablet Commonly known as: SYNTHROID Take 112 mcg by mouth daily before breakfast.   nystatin 100000 UNIT/ML suspension Commonly known as: MYCOSTATIN Take 5 mLs (500,000 Units total) by mouth 4 (four) times daily for 2 days.   oxybutynin 5 MG tablet Commonly known as: DITROPAN Take 1 tablet (5 mg total) by mouth every 8 (eight) hours as needed for bladder spasms.   pantoprazole 40 MG tablet Commonly known as: PROTONIX Take 40 mg by mouth 2 (two) times daily.   sertraline 50 MG tablet Commonly  known as: ZOLOFT Take 50 mg by mouth daily. hs   traMADol 50 MG tablet Commonly known as: ULTRAM Take 1 tablet (50 mg total) by mouth every 6 (six) hours as needed for moderate pain.   valsartan 320 MG tablet Commonly known as: DIOVAN Take 320 mg by mouth daily.   warfarin 3 MG tablet Commonly known as: COUMADIN Take 3 mg by mouth daily. Sat sun mon tues   warfarin 4 MG tablet Commonly known as: COUMADIN Take 4 mg by mouth daily. Wed thurs fri               Discharge Care Instructions  (From admission, onward)           Start     Ordered   04/22/21 0000  Discharge wound care:       Comments:  No particular wound care for laparoscopic incisions, which have been glued.  Ostomy care per Ostomy nurse instructions   04/22/21 1221            CT ABDOMEN PELVIS W CONTRAST  Result Date: 04/09/2021 CLINICAL DATA:  3 days postop from subtotal colectomy with ileostomy for obstructing colon carcinoma. Abdominal pain. Suspected abscess. EXAM: CT ABDOMEN AND PELVIS WITH CONTRAST TECHNIQUE: Multidetector CT imaging of the abdomen and pelvis was performed using the standard protocol following bolus administration of intravenous contrast. CONTRAST:  58mL OMNIPAQUE IOHEXOL 350 MG/ML SOLN COMPARISON:  04/01/2021 FINDINGS: Lower Chest: Increased compressive atelectasis seen in the right lung base. New ill-defined nodular opacity in the posterior left lower lobe since prior study 1 week ago, consistent with infectious or inflammatory etiology. A lobulated soft tissue mass is also seen in the medial left breast which measures 3.3 x 2.4 cm. Stable cardiomegaly and small pericardial effusion. Hepatobiliary: No hepatic masses identified. Prior cholecystectomy. No evidence of biliary obstruction. Pancreas:  No mass or inflammatory changes. Spleen: Within normal limits in size and appearance. Adrenals/Urinary Tract: No masses identified. Bilateral renal cysts remains stable. No evidence of ureteral  calculi or hydronephrosis. Unremarkable unopacified urinary bladder. Stomach/Bowel: Patient has undergone subtotal colectomy with right abdominal ileostomy since prior exam. Small amount of free intraperitoneal air is seen which is attributed to recent surgery. Severe diverticulosis is again seen involving the residual portion of the sigmoid colon. There is increased diffuse haustral fold thickening of the sigmoid colon, although there is no evidence of acute diverticulitis. This is nonspecific, and differential diagnosis includes infectious and ischemic colitis. Diffuse moderate to severe small bowel dilatation is seen to the site of the right abdominal ileostomy. No evidence of pneumatosis, focal inflammatory process, or abscess. Mild ascites is noted. Vascular/Lymphatic: No pathologically enlarged lymph nodes. No acute vascular findings. Aortic atherosclerotic calcification noted. Reproductive: Prior hysterectomy noted. Adnexal regions are unremarkable in appearance. Other: Severe subcutaneous emphysema is seen throughout the chest and abdominal wall soft tissues, which is much greater than typically seen postoperatively. Musculoskeletal:  No suspicious bone lesions identified. IMPRESSION: Severe subcutaneous emphysema throughout the chest and abdominal wall soft tissues, much greater than typically seen postoperatively. Gas-forming infection/necrotizing fasciitis cannot be excluded; clinical correlation is recommended. Status post subtotal colectomy and right abdominal ileostomy, with small amount of free air, which is likely postoperative in nature. Diffuse moderate to severe small bowel dilatation to the site of the right abdominal ileostomy, likely due to severe ileus although obstruction at the ostomy site cannot be excluded. No evidence of pneumatosis. Increased haustral fold thickening involving the residual sigmoid colon, which shows severe diverticulosis. This appearance is not typical for  diverticulitis, and differential diagnosis includes infectious and ischemic colitis. Mild ascites. No evidence of abscess. 3.3 cm lobulated soft tissue mass in medial left breast. Malignancy cannot be excluded. Recommend dedicated breast imaging evaluation for further characterization. These results were called by telephone at the time of interpretation on 04/09/2021 at 3:23 pm to provider Dr. Ronny Bacon , who verbally acknowledged these results. Electronically Signed   By: Marlaine Hind M.D.   On: 04/09/2021 15:23   CT ABDOMEN PELVIS W CONTRAST  Result Date: 04/01/2021 CLINICAL DATA:  87 year old female with abdominal pain and distension. History of large bowel diverticulosis. EXAM: CT ABDOMEN AND PELVIS WITH CONTRAST TECHNIQUE: Multidetector CT imaging of the abdomen and pelvis was performed using the standard protocol following bolus administration of intravenous contrast. CONTRAST:  66mL OMNIPAQUE IOHEXOL 350  MG/ML SOLN COMPARISON:  CT Abdomen and Pelvis 03/11/2009. Abdomen ultrasound 04/29/2017. FINDINGS: Lower chest: Chronic cardiomegaly appears progressed since 2010. No pericardial effusion. Tortuous descending thoracic aorta with calcified atherosclerosis. Moderate to large chronic hiatal hernia has progressed since 2010 and is eccentric to the right as before. Near complete intrathoracic stomach which is mostly decompressed today. Associated atelectasis but otherwise negative lung bases. No pleural effusion. Hepatobiliary: Chronically absent gallbladder. No liver lesion identified. Pancreas: Atrophied. Spleen: Negative. Adrenals/Urinary Tract: Normal adrenal glands. Nonobstructed kidneys. Chronic exophytic left renal cysts are benign with simple fluid density and have not significantly changed since 2010. There is symmetric renal contrast excretion on the delayed images. Ureters are within normal limits. Negative bladder. Stomach/Bowel: Decompressed rectum. Extensive diverticulosis of the sigmoid  colon which is redundant in the pelvis. Indistinct appearance of the sigmoid, but no convincing focal active inflammation. Diverticulosis continues through the descending colon which is decompressed. No descending inflammation. However, the distal transverse colon and splenic flexure are abnormal. There is an abrupt caliber change in the distal transverse colon best seen on series 6, image 19 where an apple core tumor of the bowel can not be excluded. And delayed images which also demonstrate an unchanged appearance of the tapered transverse colon on series 4, image 43. But there is continued somewhat circumferential bowel wall thickening (series 2, image 29) up to nearly 10 mm toward the splenic flexure. The splenic flexure is decompressed and indistinct. Upstream of the distal transverse colon there is increased retained stool and distension of the large bowel to the cecum. And there is mild mesenteric stranding in those segments. Mild right colon diverticulosis. Diminutive or absent appendix. Terminal ileum is within normal limits. No dilated small bowel. Largely intrathoracic stomach. Duodenum is negative. No free air. No free fluid identified in the abdomen. Vascular/Lymphatic: Aortoiliac calcified atherosclerosis. Major arterial structures remain patent. Patent portal venous system. No lymphadenopathy. Reproductive: Absent uterus. Ovaries are diminutive and normal. Numerous pelvic phleboliths. Other: There is trace free fluid in the pelvis with simple fluid density (coronal image 60). Musculoskeletal: Dextroconvex lumbar scoliosis. Advanced lower thoracic and upper lumbar disc and endplate degeneration. Widespread advanced lumbar facet degeneration. Left lumbosacral junction assimilation joint. Hip joint degeneration. No acute osseous abnormality identified. IMPRESSION: 1. Abnormal large bowel. There is an abrupt caliber change in the distal transverse colon (series 2, image 36) with upstream colon distended  with stool and appearing mildly inflamed. An obstructing Apple Core Tumor is not be excluded. Follow-up Endoscopy, or failing that Virtual Colonoscopy, is recommended. But from that lesion to the splenic flexure there is also somewhat generalized bowel wall thickening which would be more typical of colitis. Underlying large bowel diverticula, severe in the sigmoid colon, but no active diverticulitis. 2. No free air. Trace free fluid in the pelvis is felt secondary to #1. 3. Progressed Cardiomegaly and hiatal hernia since 2010, now with largely intrathoracic stomach. 4. Aortic Atherosclerosis (ICD10-I70.0). Electronically Signed   By: Genevie Ann M.D.   On: 04/01/2021 10:48   DG Chest Port 1 View  Result Date: 04/14/2021 CLINICAL DATA:  Check PICC line placement EXAM: PORTABLE CHEST 1 VIEW COMPARISON:  04/08/2021 FINDINGS: Left-sided PICC line is noted extending into the mid superior vena cava. Aortic calcifications are seen. Heart is enlarged in size but stable. Persistent chronic hiatal hernia is noted on the right with evidence of compressive atelectasis. The overall appearance is similar to that seen on the prior exam. No findings to suggest pneumothorax are seen.  No new focal infiltrate is noted. IMPRESSION: Stable appearance of the chest when compared with the prior study. Left-sided PICC line is noted in the mid superior vena cava. This is approximately 3-4 cm short of the cavoatrial junction but in usable position. Electronically Signed   By: Inez Catalina M.D.   On: 04/14/2021 16:25   DG Chest Port 1 View  Result Date: 04/08/2021 CLINICAL DATA:  Pleural effusion. EXAM: PORTABLE CHEST 1 VIEW COMPARISON:  November 13, 2012. FINDINGS: Stable cardiomegaly. No pneumothorax is noted. Bilateral subcutaneous emphysema is noted. Elevated right hemidiaphragm is noted. Mild bibasilar subsegmental atelectasis is noted. Bony thorax is unremarkable. IMPRESSION: Bilateral subcutaneous emphysema is noted. Elevated right  hemidiaphragm is noted. Mild bibasilar subsegmental atelectasis. Electronically Signed   By: Marijo Conception M.D.   On: 04/08/2021 15:33   DG Abd 2 Views  Result Date: 04/03/2021 CLINICAL DATA:  Colonic obstruction EXAM: ABDOMEN - 2 VIEW COMPARISON:  Abdominal x-ray dated April 02, 2021 FINDINGS: No gas-filled dilated loops of bowel. No evidence of pneumatosis. Wall thickening of the descending colon. No acute osseous abnormality. IMPRESSION: No gas-filled dilated loops of bowel. Electronically Signed   By: Yetta Glassman M.D.   On: 04/03/2021 09:41   DG Abd 2 Views  Result Date: 04/02/2021 CLINICAL DATA:  87 year old female with abdominal pain. Abnormal transverse colon on CT yesterday. EXAM: ABDOMEN - 2 VIEW COMPARISON:  CT Abdomen and Pelvis 04/01/2021. FINDINGS: Upright and supine views of the abdomen and pelvis. Air-fluid level in the right colon and/or hepatic flexure on the upright view. No pneumoperitoneum. Stable cardiomegaly and increased right lung base opacity which reflects the known hiatal hernia. Overall nonobstructed bowel-gas pattern elsewhere similar to that on the CT yesterday, no dilated small bowel. Excreted IV contrast in the bladder. Pelvic phleboliths. Stable visualized osseous structures. IMPRESSION: 1. Stable bowel-gas pattern to the CT yesterday, aside from new air-fluid level in the right colon now. As before there is no small bowel dilatation. No free air. 2. Cardiomegaly.  Hiatal hernia. Electronically Signed   By: Genevie Ann M.D.   On: 04/02/2021 07:38   DG ABD ACUTE 2+V W 1V CHEST  Result Date: 04/13/2021 CLINICAL DATA:  Ileus.  Small-bowel obstruction EXAM: DG ABDOMEN ACUTE WITH 1 VIEW CHEST COMPARISON:  04/08/2021 chest radiograph and plain film. FINDINGS: Frontal view of the chest demonstrates extensive subcutaneous emphysema about the chest and abdomen, decreased. midline trachea. Mild cardiomegaly. Marked right hemidiaphragm elevation. No pleural effusion or  pneumothorax. No congestive failure. Clear lungs. Abdominal films demonstrate there is a catheter projecting over the right-side of the abdomen. Gas-filled small bowel loops at up to 3.5 cm, similar. Contrast within normal caliber rectum and sigmoid. IMPRESSION: Persistent gas-filled small bowel loops, favoring adynamic ileus. Low-grade partial small bowel obstruction could look similar. Slight improvement in diffuse subcutaneous emphysema about the chest and abdomen. Cardiomegaly, without congestive failure or other acute disease. Marked right hemidiaphragm elevation, as before. Electronically Signed   By: Abigail Miyamoto M.D.   On: 04/13/2021 13:39   DG Abd Portable 1V  Result Date: 04/08/2021 CLINICAL DATA:  Abdominal pain and ileus EXAM: PORTABLE ABDOMEN - 1 VIEW COMPARISON:  Barium enema from 5 days ago FINDINGS: Extensive soft tissue emphysema throughout the abdominal wall. Rigler sign seen in the ventral abdomen. Suspect pleural fluid at the bases with lucent central lower chest, presumed extension of gas. Mild retained barium within diverticula of the distal sigmoid. A few distended small bowel loops without generalized  distention for ileus. These results were called by telephone at the time of interpretation on 04/08/2021 at 10:17 am to provider [facility] , who verbally acknowledged these results. IMPRESSION: 1. Extensive soft tissue emphysema and pneumoperitoneum in the setting of recent laparoscopic partial colectomy. 2. Air leak continues over the lower chest with possible pleural fluid, chest radiography is suggested. Electronically Signed   By: Jorje Guild M.D.   On: 04/08/2021 10:17   DG BE (COLON)W SINGLE CM (SOL OR THIN BA)  Result Date: 04/03/2021 CLINICAL DATA:  Suspected transverse colon mass identified by CT EXAM: DOUBLE CONTRAST BARIUM ENEMA TECHNIQUE: Initial scout AP supine abdominal image obtained to insure adequate colon cleansing. Barium was introduced into the colon in a  retrograde fashion and refluxed from the rectum to the distal transverse colon. As much of the barium as possible was then removed through the indwelling tube via gravity drain. Air was then insufflated into the colon. Spot images of the colon followed by overhead radiographs were obtained. FLUOROSCOPY TIME:  Fluoroscopy Time:  1:30 Number of Acquired Spot Images: 6 COMPARISON:  CT abdomen pelvis, 04/01/2021 FINDINGS: There is ready opacification of the rectum, sigmoid, descending colon, and distal transverse colon to the level of a tight narrowing of the distal transverse colon, as seen by prior CT. There is no transit of contrast proximal to this point, including with air insufflation. Diverticulosis of the descending and sigmoid colon. IMPRESSION: 1. Successful opacification of the colon to the level of a tight narrowing of the distal transverse colon, in keeping with mass suspected by prior CT. No transit of contrast proximal to this point. This remains highly suspicious for obstipating transverse colon malignancy. 2.  Descending and sigmoid diverticulosis. Electronically Signed   By: Eddie Candle M.D.   On: 04/03/2021 15:49   [place] EKG SITE RITE  Result Date: 04/14/2021 If Site Rite image not attached, placement could not be confirmed due to current cardiac rhythm.  Results for orders placed or performed during the hospital encounter of 04/01/21  Resp Panel by RT-PCR (Flu A&B, Covid) Nasopharyngeal Swab     Status: None   Collection Time: 04/01/21  2:04 PM   Specimen: Nasopharyngeal Swab; Nasopharyngeal(NP) swabs in vial transport medium  Result Value Ref Range Status   SARS Coronavirus 2 by RT PCR NEGATIVE NEGATIVE Final    Comment: (NOTE) SARS-CoV-2 target nucleic acids are NOT DETECTED.  The SARS-CoV-2 RNA is generally detectable in upper respiratory specimens during the acute phase of infection. The lowest concentration of SARS-CoV-2 viral copies this assay can detect is 138 copies/mL. A  negative result does not preclude SARS-Cov-2 infection and should not be used as the sole basis for treatment or other patient management decisions. A negative result may occur with  improper specimen collection/handling, submission of specimen other than nasopharyngeal swab, presence of viral mutation(s) within the areas targeted by this assay, and inadequate number of viral copies(<138 copies/mL). A negative result must be combined with clinical observations, patient history, and epidemiological information. The expected result is Negative.  Fact Sheet for Patients:  EntrepreneurPulse.com.au  Fact Sheet for Healthcare Providers:  IncredibleEmployment.be  This test is no t yet approved or cleared by the [place] FDA and  has been authorized for detection and/or diagnosis of SARS-CoV-2 by FDA under an Emergency Use Authorization (EUA). This EUA will remain  in effect (meaning this test can be used) for the duration of the COVID-19 declaration under Section 564(b)(1) of the Act, 21 U.S.C.section  360bbb-3(b)(1), unless the authorization is terminated  or revoked sooner.       Influenza A by PCR NEGATIVE NEGATIVE Final   Influenza B by PCR NEGATIVE NEGATIVE Final    Comment: (NOTE) The Xpert Xpress SARS-CoV-2/FLU/RSV plus assay is intended as an aid in the diagnosis of influenza from Nasopharyngeal swab specimens and should not be used as a sole basis for treatment. Nasal washings and aspirates are unacceptable for Xpert Xpress SARS-CoV-2/FLU/RSV testing.  Fact Sheet for Patients: EntrepreneurPulse.com.au  Fact Sheet for Healthcare Providers: IncredibleEmployment.be  This test is not yet approved or cleared by the [place] FDA and has been authorized for detection and/or diagnosis of SARS-CoV-2 by FDA under an Emergency Use Authorization (EUA). This EUA will remain in effect (meaning this test can  be used) for the duration of the COVID-19 declaration under Section 564(b)(1) of the Act, 21 U.S.C. section 360bbb-3(b)(1), unless the authorization is terminated or revoked.  Performed at [facility], [address] [place], [place] [address]   MRSA Next Gen by PCR, Nasal     Status: None   Collection Time: 04/04/21  5:56 AM   Specimen: Nasal Mucosa; Nasal Swab  Result Value Ref Range Status   MRSA by PCR Next Gen NOT DETECTED NOT DETECTED Final    Comment: (NOTE) The GeneXpert MRSA Assay (FDA approved for NASAL specimens only), is one component of a comprehensive MRSA colonization surveillance program. It is not intended to diagnose MRSA infection nor to guide or monitor treatment for MRSA infections. Test performance is not FDA approved in patients less than 27 years old. Performed at [facility], [address] [place], [place] [address]   Urine Culture     Status: Abnormal   Collection Time: 04/18/21  3:17 PM   Specimen: Urine, Clean Catch  Result Value Ref Range Status   Specimen Description   Final    URINE, CLEAN CATCH Performed at [facility], [address], [place], [place] [address]    Special Requests   Final    NONE Performed at [facility], [address] [place], [place] [address]    Culture MULTIPLE SPECIES PRESENT, SUGGEST RECOLLECTION (A)  Final   Report Status 04/20/2021 FINAL  Final  SARS CORONAVIRUS 2 (TAT 6-24 HRS) Nasopharyngeal Nasopharyngeal Swab     Status: None   Collection Time: 04/21/21  4:50 PM   Specimen: Nasopharyngeal Swab  Result Value Ref Range Status   SARS Coronavirus 2 NEGATIVE NEGATIVE Final    Comment: (NOTE) SARS-CoV-2 target nucleic acids are NOT DETECTED.  The SARS-CoV-2 RNA is generally detectable in upper and lower respiratory specimens during the acute phase of infection. Negative results do not preclude SARS-CoV-2 infection, do not rule out co-infections with other pathogens,  and should not be used as the sole basis for treatment or other patient management decisions. Negative results must be combined with clinical observations, patient history, and epidemiological information. The expected result is Negative.  Fact Sheet for Patients: SugarRoll.be  Fact Sheet for Healthcare Providers: https://www.woods-mathews.com/  This test is not yet approved or cleared by the [place] FDA and  has been authorized for detection and/or diagnosis of SARS-CoV-2 by FDA under an Emergency Use Authorization (EUA). This EUA will remain  in effect (meaning this test can be used) for the duration of the COVID-19 declaration under Se ction 564(b)(1) of the Act, 21 U.S.C. section 360bbb-3(b)(1), unless the authorization is terminated or revoked sooner.  Performed at [facility]  [facility], [place] [address] [place], [place] [address]     Signed:  Edwin Dada MD.  [facility] 04/22/2021, 12:21 PM

## 2021-04-22 NOTE — Discharge Instructions (Signed)
Ileostomy Output: If ileostomy output becomes liquid and output is >1.5 L a day, would recommend starting 2 mg Imodium BID and titration as needed to maintain output <1.5. Be careful to avoid constipation.

## 2021-04-22 NOTE — Progress Notes (Signed)
Occupational Therapy Treatment Patient Details Name: TAMSEN REIST MRN: [id number] DOB: March 26, 1932 Today's Date: 04/22/2021   History of present illness Pt. is a 87 y.o. female with medical history significant of hypertension, stroke, TIA, GERD, hypothyroidism, depression with anxiety, hard of hearing, hiatal hernia, atrial fibrillation on Coumadin, breast cancer, bladder cancer, anemia, who presents with abdominal pain and constipation. 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   OT comments  Pt seen for OT treatment on this date. Upon arrival to room, pt awake in bed. Pt A&Ox4 and agreeable to OT tx. Pt currently presents with decreased strength and activity tolerance. Due to these functional impairments, pt requires MIN GUARD for sit<>stand transfers, MOD A for sit>stand LB dressing, MIN GUARD for functional mobility of short household distances (~5ft) with RW, and MIN GUARD for standing hand hygiene. Pt is making good progress toward goals. Pt continues to benefit from skilled OT services to maximize return to PLOF and minimize risk of future falls, injury, caregiver burden, and readmission. Will continue to follow POC. Discharge recommendation remains appropriate.     Recommendations for follow up therapy are one component of a multi-disciplinary discharge planning process, led by the attending physician.  Recommendations may be updated based on patient status, additional functional criteria and insurance authorization.    Follow Up Recommendations  SNF    Equipment Recommendations  3 in 1 bedside commode       Precautions / Restrictions Precautions Precautions: Fall Precaution Comments: colostomy bag Restrictions Weight Bearing Restrictions: No       Mobility Bed Mobility Overal bed mobility: Needs Assistance Bed Mobility: Supine to Sit   Sidelying to sit: Min  guard;HOB elevated       General bed mobility comments: with increased time and use of bedrails, no physical assist required    Transfers Overall transfer level: Needs assistance Equipment used: Rolling walker (2 wheeled)   Sit to Stand: Min guard        Lateral/Scoot Transfers: Min guard General transfer comment: Increased time/effort, CGA for safety    Balance Overall balance assessment: Needs assistance Sitting-balance support: No upper extremity supported;Feet supported Sitting balance-Leahy Scale: Good Sitting balance - Comments: Good sitting balance at EOB performing seated LB dressing   Standing balance support: Bilateral upper extremity supported;During functional activity Standing balance-Leahy Scale: Fair Standing balance comment: MIN GUARD to walk ~63ft with b/l UE support from RW                           ADL either performed or assessed with clinical judgement   ADL Overall ADL's : Needs assistance/impaired     Grooming: Wash/dry hands;Min guard;Standing               Lower Body Dressing: Minimal assistance;Sit to/from stand Lower Body Dressing Details (indicate cue type and reason): MIN GUARD to don/doff socks in sitting. MOD A to don/doff briefs sit>stand             Functional mobility during ADLs: Min guard;Rolling walker (to walk 58ft)        Cognition Arousal/Alertness: Awake/alert Behavior During Therapy: WFL for tasks assessed/performed Overall Cognitive Status: Within Functional Limits for tasks assessed  General Comments: A &O x 4. Pleasant and agreeable throughout              General Comments SpO2 >95% throughout    Pertinent Vitals/ Pain       Pain Assessment: No/denies pain         Frequency  Min 2X/week        Progress Toward Goals  OT Goals(current goals can now be found in the care plan section)  Progress towards OT goals: Progressing toward  goals  Acute Rehab OT Goals Patient Stated Goal: To get stronger OT Goal Formulation: With patient/family Time For Goal Achievement: 05/01/21 Potential to Achieve Goals: Good  Plan Discharge plan remains appropriate;Frequency remains appropriate       AM-PAC OT "6 Clicks" Daily Activity     Outcome Measure   Help from another person eating meals?: None Help from another person taking care of personal grooming?: A Little Help from another person toileting, which includes using toliet, bedpan, or urinal?: A Little Help from another person bathing (including washing, rinsing, drying)?: A Lot Help from another person to put on and taking off regular upper body clothing?: A Little Help from another person to put on and taking off regular lower body clothing?: A Lot 6 Click Score: 17    End of Session Equipment Utilized During Treatment: Oxygen;Gait belt;Rolling walker  OT Visit Diagnosis: Unsteadiness on feet (R26.81);Muscle weakness (generalized) (M62.81)   Activity Tolerance Patient tolerated treatment well   Patient Left in chair;with call bell/phone within reach;with chair alarm set;with family/visitor present;Other (comment) (with MD)   Nurse Communication Mobility status        Time: 4034-7425 OT Time Calculation (min): 33 min  Charges: OT General Charges $OT Visit: 1 Visit OT Treatments $Self Care/Home Management : 23-37 mins  Fredirick Maudlin, OTR/L [place]

## 2021-04-22 NOTE — Consult Note (Signed)
WOC requested for assitance with supplies for DC;  Secure Chat to the bedside nurse; see below   2 1/4" 2pc with 2" barrier rings [employer] comparable numbers for SNF: 2" skin barrier rings (either [employer] number [phone] or [phone]; 2 1/4" skin barrier ([employer]oll[employer] number [phone]); 2 1/4" ostomy pouch Surveyor, mining # [phone])    The Kellie Simmering numbers to order from materials (prior to DC)  Kellie Simmering # [phone] (2 1/4 skin barrier), Lawson# [phone] (2 1/4" ostomy pouch)  Lawson # [phone] (2" skin barrier ring)  I would suggest sending her with 4 of each to SNF.  [place], [place], [place]

## 2021-04-22 NOTE — TOC Transition Note (Signed)
Transition of Care [facility]) - CM/SW Discharge Note   Patient Details  Name: Gabriela [name] MRN: [id number] Date of Birth: [birth year]  Transition of Care [facility]) CM/SW Contact:  Beverly Sessions, RN Phone Number: 04/22/2021, 3:33 PM   Clinical Narrative:     Patient will DC XL:KGMW  Anticipated DC date: 04/22/21  Family notified:Daughter sus Transport by:EMS  Per MD patient ready for DC to . RN, patient, patient's family, and facility notified of DC. Discharge Summary sent to facility. RN given number for report. DC packet on chart. Ambulance transport requested for patient.  TOC signing off.  Isaias Cowman [facility] [phone]     Barriers to Discharge: Continued Medical Work up   Patient Goals and CMS Choice   CMS Medicare.gov Compare Post Acute Care list provided to:: Patient (Son)    Discharge Placement                       Discharge Plan and Services     Post Acute Care Choice: Skilled Nursing Facility                               Social Determinants of Health (SDOH) Interventions     Readmission Risk Interventions No flowsheet data found.

## 2021-04-22 NOTE — Plan of Care (Signed)
  Problem: Clinical Measurements: Goal: Ability to maintain clinical measurements within normal limits will improve Outcome: Progressing Goal: Will remain free from infection Outcome: Progressing Goal: Diagnostic test results will improve Outcome: Progressing Goal: Respiratory complications will improve Outcome: Progressing Goal: Cardiovascular complication will be avoided Outcome: Progressing   Problem: Pain Managment: Goal: General experience of comfort will improve Outcome: Progressing   Pt is involved in and agrees with the plan of care. V/S stable. No complaints of pain at this time. Ileostomy bag in place; draining well.

## 2021-04-22 NOTE — Progress Notes (Addendum)
[place] Hospital Day(s): 21.   Post op day(s): 7 Days Post-Op.   Interval History:  Patient seen and examined No acute events or new complaints overnight.  Patient reports she is feeling better and making good improvements No fever, chills, nausea, emesis Labs for this morning are pending and awaiting lab draw  She is on regular diet; TPN stopped yesterday (09/27) Ileostomy with 450 ccs; thicker Working with therapies; recommending SNF; accepted at [facility]  Vital signs in last 24 hours: [min-max] current  Temp:  [98 F (36.7 C)-98.7 F (37.1 C)] 98.4 F (36.9 C) (09/28 0314) Pulse Rate:  [64-79] 64 (09/28 0314) Resp:  [16-20] 16 (09/28 0314) BP: (135-150)/(52-67) 135/67 (09/28 0314) SpO2:  [98 %-100 %] 100 % (09/28 0314) Weight:  [71.7 kg] 71.7 kg (09/28 0500)     Height: 5\' 3"  (160 cm) Weight: 71.7 kg BMI (Calculated): 28.01   Intake/Output last 2 shifts:  09/27 0701 - 09/28 0700 In: 504 [P.O.:120; I.V.:384] Out: 2350 [Urine:1900; Stool:450]   Physical Exam:  Constitutional: alert, cooperative and no distress  Respiratory: breathing non-labored at rest  Cardiovascular: regular rate and sinus rhythm  Gastrointestinal:  Soft, non-tender, less distended, she is not peritonitic. Ileostomy in the right abdomen, pink, there is some semi-solid stool in bag, + flatus Integumentary: Laparoscopic incisions are CDI with dermabond, no erythema or drainage   Labs:  CBC Latest Ref Rng & Units 04/21/2021 04/20/2021 04/19/2021  WBC 4.0 - 10.5 K/uL 18.3(H) 19.4(H) 20.5(H)  Hemoglobin 12.0 - 15.0 g/dL 7.4(L) 7.1(L) 7.2(L)  Hematocrit 36.0 - 46.0 % 24.6(L) 23.1(L) 22.9(L)  Platelets 150 - 400 K/uL 524(H) 478(H) 460(H)   CMP Latest Ref Rng & Units 04/21/2021 04/20/2021 04/19/2021  Glucose 70 - 99 mg/dL 117(H) 117(H) 124(H)  BUN 8 - 23 mg/dL 15 18 26(H)  Creatinine 0.44 - 1.00 mg/dL 0.32(L) 0.32(L) 0.42(L)  Sodium 135 - 145 mmol/L 134(L) 133(L) 136   Potassium 3.5 - 5.1 mmol/L 4.1 4.3 4.3  Chloride 98 - 111 mmol/L 99 101 102  CO2 22 - 32 mmol/L 26 24 28   Calcium 8.9 - 10.3 mg/dL 8.0(L) 7.9(L) 7.7(L)  Total Protein 6.5 - 8.1 g/dL - 5.1(L) -  Total Bilirubin 0.3 - 1.2 mg/dL - 0.4 -  Alkaline Phos 38 - 126 U/L - 79 -  AST 15 - 41 U/L - 16 -  ALT 0 - 44 U/L - 11 -     Imaging studies: No new pertinent imaging studies   Assessment/Plan:  87 y.o. female 7 Days Post-Op s/p ileostomy revision s/p robotic assisted laparoscopic subtotal colectomy with end ileostomy for obstructing transverse colon mass on 09/12.   - Continue regular diet + Nutritional supplementation - Monitor abdominal examination; on-going ileostomy function, will improve with nutrition. May need to start imodium in the future if output becomes liquid and >1.5L - Appreciate WOC RN for ileostomy teaching              - Monitor leukocytosis; improved; uncertain etiology at this point.               - Early mobilization encouraged, worked with therapies; recommending SNF             - Further management per primary service; we will follow             - Reviewed pathology & tumor board: T2N0 Adenocarcinoma; refer to oncology as outpatient    - Discharge Planning; Not much  further to add from a surgery standpoint, monitor ileostomy output at home (if >1.5L daily and liquid, would recommend starting imodium 2 mg BID and titrate as needed), okay for discharge to SNF from surgical perspective   All of the above findings and recommendations were discussed with the patient, patient's family (daughter at bedside), and the medical team, and all of patient's and family's questions were answered to their expressed satisfaction.  -- Edison Simon, PA-C [facility] 04/22/2021, 7:08 AM [phone] M-F: 7am - 4pm

## 2021-04-29 ENCOUNTER — Encounter: Payer: Self-pay | Admitting: Surgery

## 2021-04-29 ENCOUNTER — Inpatient Hospital Stay: Payer: Medicare PPO

## 2021-04-29 ENCOUNTER — Inpatient Hospital Stay: Payer: Medicare PPO | Admitting: Oncology

## 2021-04-29 LAB — SURGICAL PATHOLOGY

## 2021-05-05 ENCOUNTER — Other Ambulatory Visit
Admission: RE | Admit: 2021-05-05 | Discharge: 2021-05-05 | Disposition: A | Discharge status: Home / Self Care | Payer: Medicare PPO | Source: Ambulatory Visit | Attending: Surgery | Admitting: Surgery

## 2021-05-05 ENCOUNTER — Ambulatory Visit (INDEPENDENT_AMBULATORY_CARE_PROVIDER_SITE_OTHER): Payer: Medicare PPO | Admitting: Surgery

## 2021-05-05 ENCOUNTER — Encounter: Payer: Self-pay | Admitting: Surgery

## 2021-05-05 ENCOUNTER — Other Ambulatory Visit: Payer: Self-pay

## 2021-05-05 VITALS — BP 100/64 | HR 102 | Temp 97.9°F | Ht 63.0 in | Wt 130.6 lb

## 2021-05-05 DIAGNOSIS — R399 Unspecified symptoms and signs involving the genitourinary system: Secondary | ICD-10-CM | POA: Diagnosis present

## 2021-05-05 DIAGNOSIS — Z09 Encounter for follow-up examination after completed treatment for conditions other than malignant neoplasm: Secondary | ICD-10-CM

## 2021-05-05 DIAGNOSIS — R300 Dysuria: Secondary | ICD-10-CM

## 2021-05-05 LAB — URINALYSIS, ROUTINE W REFLEX MICROSCOPIC
Bilirubin Urine: NEGATIVE
Glucose, UA: NEGATIVE mg/dL
Ketones, ur: 5 mg/dL — AB
Nitrite: POSITIVE — AB
Protein, ur: 100 mg/dL — AB
Specific Gravity, Urine: 1.014 (ref 1.005–1.030)
pH: 8 (ref 5.0–8.0)

## 2021-05-05 LAB — URINALYSIS, MICROSCOPIC (REFLEX)
RBC / HPF: >50 RBC/hpf (ref 0–5)
Squamous Epithelial / HPF: NONE SEEN (ref 0–5)
WBC, UA: >50 WBC/hpf (ref 0–5)

## 2021-05-05 MED ORDER — CIPROFLOXACIN HCL 500 MG PO TABS: 500.0000 mg | ORAL_TABLET | Freq: Two times a day (BID) | ORAL | 0 refills | Status: AC

## 2021-05-05 NOTE — Patient Instructions (Addendum)
A urinalysis has been placed. You can go to the lab at [facility]. We will call you with the results.  Please pick up your prescription at [place].   If you have any concerns or questions, please feel free to call our office.

## 2021-05-05 NOTE — Progress Notes (Signed)
This patient is well-known to me from her prolonged hospitalization following a robotic subtotal colectomy with end ileostomy.  She subsequently needed an ileostomy revision due to the obstructive nature, and resultant ileus. She is currently having good ileostomy function, reports her appetite is not as good as it should be. She denies any abdominal pain.  Biggest complaints involve severe pain with voiding.  She reports urethral burning sensation pain with voiding, denies frequency, denies spastic crampy bladder pain.  She reports there has been nothing done to evaluate her urine, she had an external cath throughout her hospitalization.  She is also apparently completed the goals of her rehab and they are intending to discharge her soon.  The family has not comfortable with care for the stoma and utilization of the appliance and application and maintenance of the appliance.  On examination today her abdomen is benign and soft nondistended.  Her stoma appears to be functioning well.  She appears well, and has never looked stronger in my brief history with her.  We will order urinalysis and urinary culture and sensitivity.  I will start her on 5-day course of Cipro and hopefully follow-up these results and adjust antibiotics as needed.  In the written report that went back to [facility], I have requested that a period of training/hands-on demonstration of stomal appliance application, maintenance etc. be done prior to her discharge to home.  Be glad to help in any other way I can in the interim.

## 2021-05-06 LAB — URINE CULTURE

## 2021-05-07 ENCOUNTER — Telehealth: Payer: Self-pay | Admitting: Surgery

## 2021-05-07 NOTE — Telephone Encounter (Signed)
Daughter, [name] is calling and wanting to know re: results of urinalysis that is the antibiotic that was prescribed to her mom will cover for all of her symptoms.  Please call her, thank you.

## 2021-05-07 NOTE — Telephone Encounter (Signed)
Spoke with [name] -mother is currently feeling better-not quite as bad a she was-also discussed calling Dr.Feldpausch for an office visit and she agreed.

## 2021-05-08 ENCOUNTER — Other Ambulatory Visit (HOSPITAL_COMMUNITY): Payer: Self-pay | Admitting: Nurse Practitioner

## 2021-05-08 DIAGNOSIS — Z432 Encounter for attention to ileostomy: Secondary | ICD-10-CM

## 2021-05-12 ENCOUNTER — Encounter: Payer: Self-pay | Admitting: Internal Medicine

## 2021-05-12 ENCOUNTER — Observation Stay
Admission: EM | Admit: 2021-05-12 | Discharge: 2021-05-14 | Disposition: A | Discharge status: Home / Self Care | Payer: Medicare PPO | Attending: Emergency Medicine | Admitting: Emergency Medicine

## 2021-05-12 ENCOUNTER — Other Ambulatory Visit: Payer: Self-pay

## 2021-05-12 DIAGNOSIS — D649 Anemia, unspecified: Secondary | ICD-10-CM | POA: Diagnosis present

## 2021-05-12 DIAGNOSIS — Z933 Colostomy status: Secondary | ICD-10-CM

## 2021-05-12 DIAGNOSIS — Z8673 Personal history of transient ischemic attack (TIA), and cerebral infarction without residual deficits: Secondary | ICD-10-CM | POA: Insufficient documentation

## 2021-05-12 DIAGNOSIS — Z7901 Long term (current) use of anticoagulants: Secondary | ICD-10-CM | POA: Diagnosis not present

## 2021-05-12 DIAGNOSIS — R791 Abnormal coagulation profile: Secondary | ICD-10-CM | POA: Diagnosis not present

## 2021-05-12 DIAGNOSIS — R531 Weakness: Secondary | ICD-10-CM | POA: Diagnosis present

## 2021-05-12 DIAGNOSIS — E860 Dehydration: Secondary | ICD-10-CM | POA: Diagnosis not present

## 2021-05-12 DIAGNOSIS — N390 Urinary tract infection, site not specified: Secondary | ICD-10-CM

## 2021-05-12 DIAGNOSIS — I482 Chronic atrial fibrillation, unspecified: Secondary | ICD-10-CM | POA: Diagnosis not present

## 2021-05-12 DIAGNOSIS — N289 Disorder of kidney and ureter, unspecified: Secondary | ICD-10-CM

## 2021-05-12 DIAGNOSIS — N3001 Acute cystitis with hematuria: Principal | ICD-10-CM

## 2021-05-12 DIAGNOSIS — E039 Hypothyroidism, unspecified: Secondary | ICD-10-CM | POA: Diagnosis not present

## 2021-05-12 DIAGNOSIS — N179 Acute kidney failure, unspecified: Secondary | ICD-10-CM | POA: Diagnosis not present

## 2021-05-12 DIAGNOSIS — I959 Hypotension, unspecified: Secondary | ICD-10-CM

## 2021-05-12 DIAGNOSIS — Z20822 Contact with and (suspected) exposure to covid-19: Secondary | ICD-10-CM | POA: Diagnosis not present

## 2021-05-12 DIAGNOSIS — Z853 Personal history of malignant neoplasm of breast: Secondary | ICD-10-CM | POA: Insufficient documentation

## 2021-05-12 DIAGNOSIS — E871 Hypo-osmolality and hyponatremia: Secondary | ICD-10-CM | POA: Diagnosis not present

## 2021-05-12 DIAGNOSIS — E440 Moderate protein-calorie malnutrition: Secondary | ICD-10-CM | POA: Insufficient documentation

## 2021-05-12 HISTORY — DX: Unspecified atrial fibrillation: I48.91

## 2021-05-12 HISTORY — DX: Anemia, unspecified: D64.9

## 2021-05-12 LAB — BASIC METABOLIC PANEL
Anion gap: 8 (ref 5–15)
BUN: 31 mg/dL — ABNORMAL HIGH (ref 8–23)
CO2: 19 mmol/L — ABNORMAL LOW (ref 22–32)
Calcium: 9.1 mg/dL (ref 8.9–10.3)
Chloride: 104 mmol/L (ref 98–111)
Creatinine, Ser: 1.39 mg/dL — ABNORMAL HIGH (ref 0.44–1.00)
GFR, Estimated: 37 mL/min — ABNORMAL LOW (ref >60)
Glucose, Bld: 114 mg/dL — ABNORMAL HIGH (ref 70–99)
Potassium: 4.5 mmol/L (ref 3.5–5.1)
Sodium: 131 mmol/L — ABNORMAL LOW (ref 135–145)

## 2021-05-12 LAB — URINALYSIS, COMPLETE (UACMP) WITH MICROSCOPIC
Bilirubin Urine: NEGATIVE
Glucose, UA: 50 mg/dL — AB
Ketones, ur: NEGATIVE mg/dL
Nitrite: NEGATIVE
Protein, ur: 30 mg/dL — AB
Specific Gravity, Urine: 1.013 (ref 1.005–1.030)
WBC, UA: >50 WBC/hpf — ABNORMAL HIGH (ref 0–5)
pH: 5 (ref 5.0–8.0)

## 2021-05-12 LAB — RESP PANEL BY RT-PCR (FLU A&B, COVID) ARPGX2
Influenza A by PCR: NEGATIVE
Influenza B by PCR: NEGATIVE
SARS Coronavirus 2 by RT PCR: NEGATIVE

## 2021-05-12 LAB — CBC
HCT: 30.3 % — ABNORMAL LOW (ref 36.0–46.0)
Hemoglobin: 9.1 g/dL — ABNORMAL LOW (ref 12.0–15.0)
MCH: 25.3 pg — ABNORMAL LOW (ref 26.0–34.0)
MCHC: 30 g/dL (ref 30.0–36.0)
MCV: 84.2 fL (ref 80.0–100.0)
Platelets: 545 10*3/uL — ABNORMAL HIGH (ref 150–400)
RBC: 3.6 MIL/uL — ABNORMAL LOW (ref 3.87–5.11)
RDW: 18.4 % — ABNORMAL HIGH (ref 11.5–15.5)
WBC: 11.8 10*3/uL — ABNORMAL HIGH (ref 4.0–10.5)
nRBC: 0 % (ref 0.0–0.2)

## 2021-05-12 LAB — PROTIME-INR
INR: 4.3 (ref 0.8–1.2)
Prothrombin Time: 41.5 seconds — ABNORMAL HIGH (ref 11.4–15.2)

## 2021-05-12 MED ORDER — OXYBUTYNIN CHLORIDE 5 MG PO TABS
5.0000 mg | ORAL_TABLET | Freq: Three times a day (TID) | ORAL | Status: DC | PRN
  Filled 2021-05-12: qty 1

## 2021-05-12 MED ORDER — SODIUM CHLORIDE 0.9 % IV SOLN: INTRAVENOUS | Status: DC

## 2021-05-12 MED ORDER — SENNOSIDES-DOCUSATE SODIUM 8.6-50 MG PO TABS: 1.0000 | ORAL_TABLET | Freq: Every evening | ORAL | Status: DC | PRN

## 2021-05-12 MED ORDER — BISACODYL 5 MG PO TBEC: 5.0000 mg | DELAYED_RELEASE_TABLET | Freq: Every day | ORAL | Status: DC | PRN

## 2021-05-12 MED ORDER — METOPROLOL TARTRATE 5 MG/5ML IV SOLN: 2.5000 mg | Freq: Four times a day (QID) | INTRAVENOUS | Status: DC | PRN

## 2021-05-12 MED ORDER — AMLODIPINE BESYLATE 5 MG PO TABS
5.0000 mg | ORAL_TABLET | Freq: Every day | ORAL | Status: DC
  Administered 2021-05-13 – 2021-05-14 (×2): 5 mg via ORAL
  Filled 2021-05-12 (×2): qty 1

## 2021-05-12 MED ORDER — SODIUM CHLORIDE 0.9 % IV SOLN
1.0000 g | Freq: Once | INTRAVENOUS | Status: AC
  Administered 2021-05-12: 1 g via INTRAVENOUS
  Filled 2021-05-12: qty 10

## 2021-05-12 MED ORDER — IRBESARTAN 150 MG PO TABS
300.0000 mg | ORAL_TABLET | Freq: Every day | ORAL | Status: DC
  Administered 2021-05-13: 10:00:00 300 mg via ORAL
  Filled 2021-05-12 (×2): qty 2

## 2021-05-12 MED ORDER — ALPRAZOLAM 0.25 MG PO TABS
0.1250 mg | ORAL_TABLET | Freq: Every evening | ORAL | Status: DC | PRN
  Administered 2021-05-13: 0.125 mg via ORAL
  Filled 2021-05-12 (×2): qty 1

## 2021-05-12 MED ORDER — ONDANSETRON HCL 4 MG/2ML IJ SOLN: 4.0000 mg | Freq: Four times a day (QID) | INTRAMUSCULAR | Status: DC | PRN

## 2021-05-12 MED ORDER — PANTOPRAZOLE SODIUM 40 MG PO TBEC
40.0000 mg | DELAYED_RELEASE_TABLET | Freq: Two times a day (BID) | ORAL | Status: DC
  Administered 2021-05-12 – 2021-05-14 (×4): 40 mg via ORAL
  Filled 2021-05-12 (×4): qty 1

## 2021-05-12 MED ORDER — LEVOTHYROXINE SODIUM 112 MCG PO TABS
112.0000 ug | ORAL_TABLET | Freq: Every day | ORAL | Status: DC
  Administered 2021-05-13 – 2021-05-14 (×2): 112 ug via ORAL
  Filled 2021-05-12 (×3): qty 1

## 2021-05-12 MED ORDER — SODIUM CHLORIDE 0.9 % IV BOLUS
1000.0000 mL | Freq: Once | INTRAVENOUS | Status: AC
  Administered 2021-05-12: 1000 mL via INTRAVENOUS

## 2021-05-12 MED ORDER — WARFARIN - PHARMACIST DOSING INPATIENT
Freq: Every day | Status: DC
  Filled 2021-05-12: qty 1

## 2021-05-12 MED ORDER — ONDANSETRON HCL 4 MG PO TABS: 4.0000 mg | ORAL_TABLET | Freq: Four times a day (QID) | ORAL | Status: DC | PRN

## 2021-05-12 MED ORDER — ACETAMINOPHEN 325 MG PO TABS
650.0000 mg | ORAL_TABLET | Freq: Four times a day (QID) | ORAL | Status: DC | PRN
  Administered 2021-05-13 (×2): 650 mg via ORAL
  Filled 2021-05-12 (×2): qty 2

## 2021-05-12 MED ORDER — ACETAMINOPHEN 650 MG RE SUPP: 650.0000 mg | Freq: Four times a day (QID) | RECTAL | Status: DC | PRN

## 2021-05-12 MED ORDER — ALBUTEROL SULFATE (2.5 MG/3ML) 0.083% IN NEBU: 2.5000 mg | INHALATION_SOLUTION | Freq: Four times a day (QID) | RESPIRATORY_TRACT | Status: DC | PRN

## 2021-05-12 MED ORDER — NYSTATIN 100000 UNIT/ML MT SUSP
5.0000 mL | Freq: Four times a day (QID) | OROMUCOSAL | Status: DC
  Administered 2021-05-13 – 2021-05-14 (×7): 500000 [IU] via ORAL
  Filled 2021-05-12 (×7): qty 5

## 2021-05-12 MED ORDER — ENSURE ENLIVE PO LIQD
237.0000 mL | Freq: Three times a day (TID) | ORAL | Status: DC
  Administered 2021-05-13 (×2): 237 mL via ORAL

## 2021-05-12 MED ORDER — SERTRALINE HCL 50 MG PO TABS
50.0000 mg | ORAL_TABLET | Freq: Every day | ORAL | Status: DC
  Administered 2021-05-12 – 2021-05-13 (×2): 50 mg via ORAL
  Filled 2021-05-12 (×2): qty 1

## 2021-05-12 NOTE — Progress Notes (Signed)
The patient is admitted to 1 C 113 with the diagnosis of cystitis without hematuria. A & O x 4 . Patient's hearing aids batteries run down and she's charging them. Admission profile not completed as a result of that. Will pass on to day shift to complete when the batteries are fully charge. Patient denies any acute pain. Will continue to monitor.

## 2021-05-12 NOTE — ED Triage Notes (Signed)
Pt arrives via pov, pt had a recent ileostomy surg, pt was visited by her home health nurse today who was concerned about her bp being low, 90/50 in triage, resp are even and unlabored in triage at this time, family is concerned about her poss having a uti due to her weakness and pt's INR was 6.2 that was drawn yesterday

## 2021-05-12 NOTE — Progress Notes (Signed)
The indicated she has mouth sore and has been using nystatin swish and swallow at home and will like to continue here. Dr. Damita Dunnings notified and received a new order. Will continue to monitor.

## 2021-05-12 NOTE — H&P (Signed)
History and Physical    Gabriela [name] WLN:[id number] DOB: April 15, 1932 DOA: 05/12/2021  PCP: [name], MD  Patient coming from: Home  I have personally briefly reviewed patient's old medical records in [facility].  Chief Complaint: Generalized weakness and change in urine  HPI: Gabriela [name] is a 87 y.o. female with medical history significant for atrial fibrillation on warfarin, GERD, history of bladder cancer (about 15 years ago) and breast cancer (many years ago), recent diagnosis of colon cancer with ostomy (surgery 04/06/21), hypothyroidism, chronic anemia, who presents to the emergency department on 05/12/2021 with generalized weakness and change in urine. They noticed a low blood pressure at home and high heart rate; SBP was 60/38 on 05/11/21 and still low by home health on the day of admission. She has felt very weak since surgery on 04/06/21 but worse in the week prior to admission.  Weakness is generalized; no focal weakness. Has fatigue; no energy. Symptoms are alleviated by nothing and exacerbated by nothing. Associated symptoms: Dysuria. Previously had hematuria but none currently. No fever or chills. Suprapubic abdominal pain x 1 week, moderate pain that is burning in character, does not radiate, is alleviated by nothing and exacerbated by nothing. No vomiting, diarrhea, or bloody stool. Mild shortness of breath with walking. Of note, patient was diagnosed with a UTI at a post-operative visit, started cipro on 05/06/21 and finished course 05/10/21.   Recent history:  Diagnosis of colon cancer, recent. 04/06/21: Ostomy with colectomy surgery 04/15/21 Revision to ostomy Hospitalized for 3 weeks: prolonged hospitalization due to elevated INR from warfarin, then ostomy was not draining well. No oral intake x 2 weeks after surgery.   ED Course: Urinalysis was suggestive of urinary tract infection.  Blood pressure was in the low normal range.  INR was elevated at 4.3.   Other labs include hemoglobin 9.1, which is increased compared to previous, creatinine 1.39  (baseline 0.4-0.6), and sodium 131.  Patient was given a bolus of IV fluids.  Cultures were ordered.  She received ceftriaxone 1 g IV x1.  Review of Systems: As per HPI otherwise all other systems reviewed and are unremarable. NEUROLOGICAL: No headache or focal weakness.   Past Medical History:  Diagnosis Date   Acid reflux    Anemia    Anxiety    Atrial fibrillation (HCC)    Bladder cancer (HCC)    Breast cancer (HCC)    Cancer (HCC)    breast,bladder   Depression    Dysrhythmia    afib   GERD (gastroesophageal reflux disease)    History of hiatal hernia    HOH (hard of hearing)    aids   Hypertension    Hyperthyroidism    Hypothyroidism    Stroke ([place])    2010   TIA (transient ischemic attack)     Past Surgical History:  Procedure Laterality Date   ABDOMINAL HYSTERECTOMY     ANKLE ARTHODESIS W/ ARTHROSCOPY     BLADDER SURGERY     CATARACT EXTRACTION W/PHACO Left 12/02/2014   Procedure: CATARACT EXTRACTION PHACO AND INTRAOCULAR LENS PLACEMENT ([place]);  Surgeon: Estill Cotta, MD;  Location: [facility] ORS;  Service: Ophthalmology;  Laterality: Left;  US01:45 AP%25.7 CDE42.99   CHOLECYSTECTOMY     EYE SURGERY     cataract   ILEOSTOMY CLOSURE N/A 04/15/2021   Procedure: ILEOSTOMY TAKEDOWN REVISION;  Surgeon: Ronny Bacon, MD;  Location: [facility] ORS;  Service: General;  Laterality: N/A;  not a takedown, just a  revision...   INSERTION OF SUPRAPUBIC CATHETER  04/06/2021   Procedure: INSERTION OF FOLEY CATHETER;  Surgeon: Billey Co, MD;  Location: [facility] ORS;  Service: Urology;;   JOINT REPLACEMENT     bil tkr   MASTECTOMY     partial right   REPLACEMENT TOTAL KNEE      Social History  reports that she has never smoked. She has never used smokeless tobacco. She reports that she does not drink alcohol and does not use drugs.  Allergies  Allergen Reactions   Celebrex  [Celecoxib] Anaphylaxis   Morphine And Related Swelling    Lips and mouth   Povidone-Iodine Other (See Comments)    Other reaction(s): ITCHING Other reaction(s): ITCHING    Sulfa Antibiotics     Family History  Adopted: Yes     Home Medications  Prior to Admission medications   Medication Sig Start Date End Date Taking? Authorizing Provider  acetaminophen (TYLENOL) 325 MG tablet Take 2 tablets (650 mg total) by mouth every 6 (six) hours as needed for mild pain or fever. 04/22/21  Yes Danford, Suann Larry, MD  albuterol (VENTOLIN HFA) 108 (90 Base) MCG/ACT inhaler 2 puffs q.i.d. p.r.n. short of breath, wheezing, or cough 01/19/20  Yes [provider]  ALPRAZolam (XANAX) 0.25 MG tablet Take 0.5 tablets (0.125 mg total) by mouth at bedtime as needed for anxiety. 04/22/21  Yes Danford, Suann Larry, MD  amLODipine (NORVASC) 5 MG tablet Take 5 mg by mouth daily. 01/12/21  Yes [provider]  levothyroxine (SYNTHROID, LEVOTHROID) 112 MCG tablet Take 112 mcg by mouth daily before breakfast.   Yes [provider]  oxybutynin (DITROPAN) 5 MG tablet Take 1 tablet (5 mg total) by mouth every 8 (eight) hours as needed for bladder spasms. 04/22/21  Yes Danford, Suann Larry, MD  pantoprazole (PROTONIX) 40 MG tablet Take 40 mg by mouth 2 (two) times daily.   Yes [provider]  sertraline (ZOLOFT) 50 MG tablet Take 50 mg by mouth daily. hs   Yes [provider]  traMADol (ULTRAM) 50 MG tablet Take 1 tablet (50 mg total) by mouth every 6 (six) hours as needed for moderate pain. 04/22/21  Yes Danford, Suann Larry, MD  valsartan (DIOVAN) 320 MG tablet Take 320 mg by mouth daily.   Yes [provider]  warfarin (COUMADIN) 5 MG tablet Take 1 tablet by mouth daily.   Yes [provider]  feeding supplement (ENSURE ENLIVE / ENSURE PLUS) LIQD Take 237 mLs by mouth 3 (three) times daily between meals. 04/22/21   Danford, Suann Larry, MD   warfarin (COUMADIN) 4 MG tablet Take 4 mg by mouth daily. Wed thurs fri Patient not taking: Reported on 05/12/2021    [provider]    Physical Exam: Vitals:   05/12/21 1452 05/12/21 1454  BP: (!) 90/50   Pulse: 96   Resp: 18   Temp: 98.6 F (37 C)   TempSrc: Oral   SpO2: 97%   Weight:  60.8 kg  Height:  5\' 3"  (1.6 m)    Constitutional: NAD, calm, comfortable, ill-appearing. Vitals:   05/12/21 1452 05/12/21 1454  BP: (!) 90/50   Pulse: 96   Resp: 18   Temp: 98.6 F (37 C)   TempSrc: Oral   SpO2: 97%   Weight:  60.8 kg  Height:  5\' 3"  (1.6 m)   Eyes: Pupils equal and round, lids and conjunctivae without icterus or erythema. ENMT: Mucous membranes are dry.  Posterior pharynx clear of any exudate or lesions. Nares patent without discharge or bleeding.  Normocephalic, atraumatic.  Normal dentition.  Neck: normal, supple, no masses, trachea midline.  Thyroid nontender, no masses appreciated, no thyromegaly. Respiratory: clear to auscultation bilaterally. Chest wall movements are symmetric. No wheezing, no crackles.  No rhonchi.  Normal respiratory effort. No accessory muscle use.  Cardiovascular: Regular rate and rhythm, no murmurs / rubs / gallops. Pulses: Radial pulses 2+ bilaterally. DP pulses 1+ bilaterally. No carotid bruits.  Capillary refill less than 3 seconds. Edema: None bilaterally. GI: soft, non-distended, normal active bowel sounds. No hepatosplenomegaly. No rigidity, rebound, or guarding. No masses palpated. No CVA tenderness bilaterally. Mild suprapubic and LLQ tenderness. Ostomy in place with liquid brown/greenish-brown output.  Musculoskeletal: no clubbing / cyanosis. No joint deformity upper and lower extremities. Good ROM, no contractures. Normal muscle tone.  No tenderness or deformity in the back bilaterally. Integument: no rashes, lesions, ulcers. No induration. Clean, dry, intact. Poor skin turgor.  Neurologic: CN 2-12 grossly intact. Sensation  grossly intact to light touch. DTR 2+ bilaterally.  Babinski: Toes downgoing bilaterally.  Strength 5/5 in all 4.  Intact rapid alternating movements bilaterally.  No pronator drift. Psychiatric: Normal judgment and insight. Alert and oriented x 3. Normal mood.  Normal and appropriate affect. Lymphatic: No cervical lymphadenopathy. No supraclavicular lymphadenopathy.   Labs on Admission: I have personally reviewed the following labs and imaging studies.  CBC: Recent Labs  Lab 05/12/21 1513  WBC 11.8*  HGB 9.1*  HCT 30.3*  MCV 84.2  PLT 545*    Basic Metabolic Panel: Recent Labs  Lab 05/12/21 1513  NA 131*  K 4.5  CL 104  CO2 19*  GLUCOSE 114*  BUN 31*  CREATININE 1.39*  CALCIUM 9.1    GFR: Estimated Creatinine Clearance: 23.6 mL/min (A) (by C-G formula based on SCr of 1.39 mg/dL (H)).  Urine analysis:    Component Value Date/Time   COLORURINE YELLOW (A) 05/12/2021 1513   APPEARANCEUR CLOUDY (A) 05/12/2021 1513   APPEARANCEUR Cloudy (A) 01/02/2019 1538   LABSPEC 1.013 05/12/2021 1513   LABSPEC 1.010 05/08/2013 1326   PHURINE 5.0 05/12/2021 1513   GLUCOSEU 50 (A) 05/12/2021 1513   GLUCOSEU NEGATIVE 05/08/2013 1326   HGBUR MODERATE (A) 05/12/2021 1513   BILIRUBINUR NEGATIVE 05/12/2021 1513   BILIRUBINUR Negative 01/02/2019 1538   BILIRUBINUR NEGATIVE 05/08/2013 1326   KETONESUR NEGATIVE 05/12/2021 1513   PROTEINUR 30 (A) 05/12/2021 1513   NITRITE NEGATIVE 05/12/2021 1513   LEUKOCYTESUR LARGE (A) 05/12/2021 1513   LEUKOCYTESUR 3+ 05/08/2013 1326    Radiological Exams on Admission: No results found.  EKG: Independently reviewed. 98 bpm. Atrial fibrillation. Slight ST depression in lead aVF.   Assessment/Plan  Principal Problem:   Acute cystitis with hematuria Failed outpatient management with ciprofloxacin. Plan: Cultures ordered.  Empiric IV ceftriaxone.   Active Problems:    AKI (acute kidney injury) ([place]) Likely due to dehydration.  Plan: Avoid  nephrotoxins.  Trial of IV fluids.  If not resolved by the next day, then consider further evaluation and testing    Hyponatremia Mild.  Likely due to dehydration and minimal oral intake.  Plan: IV fluids.  Recheck sodium in the AM.  Further testing if sodium level does not resolve by the a.m.    Atrial fibrillation, chronic ([place]) Plan: Continue home medications.  Warfarin dosing adjustments per pharmacy.    Supratherapeutic INR Patient takes warfarin for atrial fibrillation.  Recently had outpatient course  of ciprofloxacin, which could have increased her INR.  No evidence of acute bleeding. Plan: Pharmacy to dose and monitor warfarin.  Goal INR 2-3.     Dehydration Plan: IV fluids.    Hypothyroidism, acquired Plan: Continue home Synthroid.    Normocytic anemia Patient has chronic anemia and admission anemia is actually improved from baseline.  Plan: Outpatient follow-up    Colostomy in place [facility]) Plan: Routine colostomy care.     DVT prophylaxis: Warfarin.  Code Status:   Full Code Family Communication:   with daughter at bedside    Disposition Plan:   Patient is from:  Home  Anticipated DC to:  Home  Anticipated DC date:  05/13/2021  Anticipated DC barriers: May need a longer course of IV antibiotics.  Consults called:  None Admission status:  Observation  Severity of Illness: The appropriate patient status for this patient is OBSERVATION. Observation status is judged to be reasonable and necessary in order to provide the required intensity of service to ensure the patient's safety. The patient's presenting symptoms, physical exam findings, and initial radiographic and laboratory data in the context of their medical condition is felt to place them at decreased risk for further clinical deterioration. Furthermore, it is anticipated that the patient will be medically stable for discharge from the hospital within 2 midnights of admission.  On admission it is actually  unclear whether patient will need more than 2 midnights of stay.  She failed an outpatient course of oral antibiotics for UTI.  She does have evidence of UTI on admission and was started on IV antibiotics.  She is severely dehydrated and requires IV fluids; she was hypotensive at home.  She does have acute kidney injury and hyponatremia, which are being treated with IV fluids.    Tacey Ruiz MD [facility]  How to contact the [facility] Attending or Consulting provider [place] or covering provider during after hours [place], for this patient?   Check the care team in [facility] and look for a) attending/consulting [facility] provider listed and b) the [facility] team listed Log into [email or web address] and use [place]'s universal password to access. If you do not have the password, please contact the hospital operator. Locate the [facility] provider you are looking for under [facility] and page to a number that you can be directly reached. If you still have difficulty reaching the provider, please page the [facility] (Director on Call) for the Hospitalists listed on amion for assistance.  05/12/2021, 5:21 PM

## 2021-05-12 NOTE — ED Notes (Signed)
Pt placed her hearing aids into her charging device. Floor RN made aware.

## 2021-05-12 NOTE — Consult Note (Signed)
ANTICOAGULATION CONSULT NOTE - Follow Up Consult  Pharmacy Consult for Warfarin Indication: atrial fibrillation  Allergies  Allergen Reactions   Celebrex [Celecoxib] Anaphylaxis   Morphine And Related Swelling    Lips and mouth   Povidone-Iodine Other (See Comments)    Other reaction(s): ITCHING Other reaction(s): ITCHING    Sulfa Antibiotics     Patient Measurements: Height: 5\' 3"  (160 cm) Weight: 60.8 kg (134 lb) IBW/kg (Calculated) : 52.4  Vital Signs: Temp: 98.6 F (37 C) (10/18 1452) Temp Source: Oral (10/18 1452) BP: 90/50 (10/18 1452) Pulse Rate: 96 (10/18 1452)  Labs: Recent Labs    05/12/21 1513  HGB 9.1*  HCT 30.3*  PLT 545*  LABPROT 41.5*  INR 4.3*  CREATININE 1.39*    Estimated Creatinine Clearance: 23.6 mL/min (A) (by C-G formula based on SCr of 1.39 mg/dL (H)).   Medications:  Warfarin regimen per pt on Med rec: 5mg  Daily (last dose 10/17)  Assessment: 87yo w/ h/o anxiety/MDD, GERD, HTN, Afib w/ prior CVA (2010), s/p robotic assisted laparoscopic subtotal colectomy with end ileostomy for obstructing transverse colon mass on 9/13 and now s/p ileostomy revision 9/21, breast / bladder cancer,  presents to the ED for generalized weakness and low blood pressure admitted with UTI. Pharmacy consulted for mgmt of [facility]. INR supratherapeutic on intake (INR 4.3)  Goal of Therapy:  INR 2-3 Monitor platelets by anticoagulation protocol: Yes   Plan:  Hold Warfarin tonight, reassess with AM INR.  Lorna Dibble, PharmD, [facility] Clinical Pharmacist 05/12/2021,5:28 PM

## 2021-05-12 NOTE — ED Provider Notes (Signed)
Emergency Medicine Provider Triage Evaluation Note  Gabriela [name] , a 87 y.o. female  was evaluated in triage.  Pt complains of weakness.  Patient had a recent ileostomy placed and was evaluated by her home health nurse today, with low blood pressure readings at home.  Patient would endorse weakness, poor appetite, and some dysuria.  Patient recently completed a course of Cipro for a recent UTI.  She was evaluated in the PCPs office yesterday with routine blood work drawn showing an elevated INR as well as a white count.  Patient presents today for evaluation of ongoing weakness and fatigue patient denies any fevers, chills, sweats, chest pain, shortness of breath.  Review of Systems  Positive: Weakness, dysuria Negative: FCS  Physical Exam  BP (!) 90/50 (BP Location: Left Arm)   Pulse 96   Temp 98.6 F (37 C) (Oral)   Resp 18   Ht 5\' 3"  (1.6 m)   Wt 60.8 kg   LMP  (LMP Unknown)   SpO2 97%   BMI 23.74 kg/m  Gen:   Awake, no distress  NAD Resp:  Normal effort CTA MSK:   Moves extremities without difficulty  Other:  CVS: RRR  Medical Decision Making  Medically screening exam initiated at 3:31 PM.  Appropriate orders placed.  Gabriela [name] was informed that the remainder of the evaluation will be completed by another provider, this initial triage assessment does not replace that evaluation, and the importance of remaining in the ED until their evaluation is complete.  Patient ED evaluation of low blood pressure, weakness, and dysuria.   Melvenia Needles, PA-C 05/12/21 1533    Vladimir Crofts, MD 05/12/21 [phone]

## 2021-05-12 NOTE — ED Provider Notes (Signed)
[facility] Emergency Department Provider Note  Time seen: 4:40 PM  I have reviewed the triage vital signs and the nursing notes.   HISTORY  Chief Complaint Weakness and Abnormal Lab   HPI Gabriela [name] is a 87 y.o. female with a past medical history of anxiety, depression, gastric reflux, hypertension, prior CVA, recent ostomy, presents to the emergency department for generalized weakness and low blood pressure.  According to the patient and daughter home health has been checking on the patient, they noted the patient's blood pressure to be very low today approximately 80 systolic, yesterday was 68 systolic.  Daughter states the patient has been acting much more fatigued and weak and the daughter is noted a foul smell to the patient's urine recently as well.  Daughter denies any known fever.  No cough or congestion.  No abdominal pain.   Past Medical History:  Diagnosis Date   Acid reflux    Anxiety    Arrhythmia    Bladder cancer ([place])    Breast cancer ([place])    Cancer ([place])    breast,bladder   Depression    Dysrhythmia    afib   GERD (gastroesophageal reflux disease)    History of hiatal hernia    HOH (hard of hearing)    aids   Hypertension    Hyperthyroidism    Hypothyroidism    Stroke ([place])    2010   TIA (transient ischemic attack)     Patient Active Problem List   Diagnosis Date Noted   Large bowel obstruction ([place]) 04/08/2021   Abdominal pain 04/01/2021   Hypertension    Hypothyroidism    Stroke ([place])    Hypokalemia    Depression with anxiety    Atrial fibrillation, chronic (HCC)    Normocytic anemia    Sacroiliitis ([place]) 07/12/2016   Pain in right hip 07/12/2016    Past Surgical History:  Procedure Laterality Date   ABDOMINAL HYSTERECTOMY     ANKLE ARTHODESIS W/ ARTHROSCOPY     BLADDER SURGERY     CATARACT EXTRACTION W/PHACO Left 12/02/2014   Procedure: CATARACT EXTRACTION PHACO AND INTRAOCULAR LENS PLACEMENT ([place]);  Surgeon:  Estill Cotta, MD;  Location: [facility] ORS;  Service: Ophthalmology;  Laterality: Left;  US01:45 AP%25.7 CDE42.99   CHOLECYSTECTOMY     EYE SURGERY     cataract   ILEOSTOMY CLOSURE N/A 04/15/2021   Procedure: ILEOSTOMY TAKEDOWN REVISION;  Surgeon: Ronny Bacon, MD;  Location: [facility] ORS;  Service: General;  Laterality: N/A;  not a takedown, just a revision...   INSERTION OF SUPRAPUBIC CATHETER  04/06/2021   Procedure: INSERTION OF FOLEY CATHETER;  Surgeon: Billey Co, MD;  Location: [facility] ORS;  Service: Urology;;   JOINT REPLACEMENT     bil tkr   MASTECTOMY     partial right   REPLACEMENT TOTAL KNEE      Prior to Admission medications   Medication Sig Start Date End Date Taking? Authorizing Provider  acetaminophen (TYLENOL) 325 MG tablet Take 2 tablets (650 mg total) by mouth every 6 (six) hours as needed for mild pain or fever. 04/22/21   Danford, Suann Larry, MD  albuterol (VENTOLIN HFA) 108 (90 Base) MCG/ACT inhaler 2 puffs q.i.d. p.r.n. short of breath, wheezing, or cough 01/19/20   [provider]  ALPRAZolam Duanne Moron) 0.25 MG tablet Take 0.5 tablets (0.125 mg total) by mouth at bedtime as needed for anxiety. 04/22/21   Danford, Suann Larry, MD  amLODipine (NORVASC) 5 MG  tablet Take 5 mg by mouth daily. 01/12/21   [provider]  feeding supplement (ENSURE ENLIVE / ENSURE PLUS) LIQD Take 237 mLs by mouth 3 (three) times daily between meals. 04/22/21   Danford, Suann Larry, MD  levothyroxine (SYNTHROID, LEVOTHROID) 112 MCG tablet Take 112 mcg by mouth daily before breakfast.    [provider]  oxybutynin (DITROPAN) 5 MG tablet Take 1 tablet (5 mg total) by mouth every 8 (eight) hours as needed for bladder spasms. 04/22/21   Danford, Suann Larry, MD  pantoprazole (PROTONIX) 40 MG tablet Take 40 mg by mouth 2 (two) times daily.    [provider]  sertraline (ZOLOFT) 50 MG tablet Take 50 mg by mouth daily. hs    [provider]   traMADol (ULTRAM) 50 MG tablet Take 1 tablet (50 mg total) by mouth every 6 (six) hours as needed for moderate pain. 04/22/21   Danford, Suann Larry, MD  valsartan (DIOVAN) 320 MG tablet Take 320 mg by mouth daily.    [provider]  warfarin (COUMADIN) 4 MG tablet Take 4 mg by mouth daily. Wed thurs Dentist, Historical, MD    Allergies  Allergen Reactions   Celebrex [Celecoxib] Anaphylaxis   Morphine And Related Swelling    Lips and mouth   Povidone-Iodine Other (See Comments)    Other reaction(s): ITCHING Other reaction(s): ITCHING    Sulfa Antibiotics     Family History  Adopted: Yes    Social History Social History   Tobacco Use   Smoking status: Never   Smokeless tobacco: Never  Substance Use Topics   Alcohol use: No   Drug use: Never    Review of Systems Constitutional: Negative for fever.  Positive for generalized weakness/fatigue Cardiovascular: Negative for chest pain. Respiratory: Negative for shortness of breath. Gastrointestinal: Negative for abdominal pain, vomiting  Genitourinary: Foul-smelling urine. Musculoskeletal: Negative for musculoskeletal complaints Neurological: Negative for headache All other ROS negative  ____________________________________________   PHYSICAL EXAM:  VITAL SIGNS: ED Triage Vitals  Enc Vitals Group     BP 05/12/21 1452 (!) 90/50     Pulse Rate 05/12/21 1452 96     Resp 05/12/21 1452 18     Temp 05/12/21 1452 98.6 F (37 C)     Temp Source 05/12/21 1452 Oral     SpO2 05/12/21 1452 97 %     Weight 05/12/21 1454 134 lb (60.8 kg)     Height 05/12/21 1454 5\' 3"  (1.6 m)     Head Circumference --      Peak Flow --      Pain Score 05/12/21 1454 0     Pain Loc --      Pain Edu? --      Excl. in [place]? --    Constitutional: Alert and oriented. Well appearing and in no distress. Eyes: Normal exam ENT      Head: Normocephalic and atraumatic.      Mouth/Throat: Mucous membranes are  moist. Cardiovascular: Normal rate, regular rhythm.  Respiratory: Normal respiratory effort without tachypnea nor retractions. Breath sounds are clear  Gastrointestinal: Soft and nontender. No distention.  Ostomy present with stool in bag. Musculoskeletal: Nontender with normal range of motion in all extremities.  Neurologic:  Normal speech and language. No gross focal neurologic deficits Skin:  Skin is warm, dry and intact.  Psychiatric: Mood and affect are normal.   ____________________________________________    EKG  EKG viewed and interpreted by myself  shows atrial fibrillation at 98 bpm with a narrow QRS, normal axis, normal intervals, nonspecific ST changes.  ____________________________________________   INITIAL IMPRESSION / ASSESSMENT AND PLAN / ED COURSE  Pertinent labs & imaging results that were available during my care of the patient were reviewed by me and considered in my medical decision making (see chart for details).   Patient presents emergency department for generalized weakness fatigue found to have low blood pressure as low as 68 systolic yesterday 80 systolic today, 75/64 upon arrival to the emergency department.  Daughter states foul-smelling urine with generalized fatigue and weakness.  Urinalysis today shows a large amount of leukocytes with greater than 50 white blood cells and rare bacteria.  INR was elevated yesterday to 6.9 currently 4.3.  Has a history of atrial fibrillation.  Patient's white blood cell count is 11.8, afebrile.  Patient's creatinine has resulted at 1.39 however this is over double her baseline of 0.6 which could be contributing to her weakness as well as hypotension.  We will begin IV hydration.  We will start IV Rocephin, send urine and blood cultures.  Given the patient's hypotension weakness with urinary tract infection we will admit to the hospitalist service for further work-up and treatment.  Patient and daughter are agreeable to this plan  of care.  SYDNE KRAHL was evaluated in Emergency Department on 05/12/2021 for the symptoms described in the history of present illness. She was evaluated in the context of the global COVID-19 pandemic, which necessitated consideration that the patient might be at risk for infection with the SARS-CoV-2 virus that causes COVID-19. Institutional protocols and algorithms that pertain to the evaluation of patients at risk for COVID-19 are in a state of rapid change based on information released by regulatory bodies including the CDC and federal and state organizations. These policies and algorithms were followed during the patient's care in the ED.  ____________________________________________   FINAL CLINICAL IMPRESSION(S) / ED DIAGNOSES  Urinary tract infection Weakness Hypotension   Harvest Dark, MD 05/12/21 1659

## 2021-05-13 DIAGNOSIS — I482 Chronic atrial fibrillation, unspecified: Secondary | ICD-10-CM

## 2021-05-13 DIAGNOSIS — E440 Moderate protein-calorie malnutrition: Secondary | ICD-10-CM | POA: Insufficient documentation

## 2021-05-13 DIAGNOSIS — N179 Acute kidney failure, unspecified: Secondary | ICD-10-CM | POA: Diagnosis not present

## 2021-05-13 DIAGNOSIS — N3001 Acute cystitis with hematuria: Secondary | ICD-10-CM | POA: Diagnosis not present

## 2021-05-13 DIAGNOSIS — E871 Hypo-osmolality and hyponatremia: Secondary | ICD-10-CM

## 2021-05-13 DIAGNOSIS — E860 Dehydration: Secondary | ICD-10-CM

## 2021-05-13 DIAGNOSIS — E039 Hypothyroidism, unspecified: Secondary | ICD-10-CM

## 2021-05-13 DIAGNOSIS — Z933 Colostomy status: Secondary | ICD-10-CM | POA: Diagnosis not present

## 2021-05-13 DIAGNOSIS — D649 Anemia, unspecified: Secondary | ICD-10-CM

## 2021-05-13 LAB — PROTIME-INR
INR: 5.4 (ref 0.8–1.2)
Prothrombin Time: 49.3 seconds — ABNORMAL HIGH (ref 11.4–15.2)

## 2021-05-13 LAB — URINE CULTURE

## 2021-05-13 LAB — BASIC METABOLIC PANEL
Anion gap: 3 — ABNORMAL LOW (ref 5–15)
BUN: 25 mg/dL — ABNORMAL HIGH (ref 8–23)
CO2: 19 mmol/L — ABNORMAL LOW (ref 22–32)
Calcium: 8.1 mg/dL — ABNORMAL LOW (ref 8.9–10.3)
Chloride: 111 mmol/L (ref 98–111)
Creatinine, Ser: 1.12 mg/dL — ABNORMAL HIGH (ref 0.44–1.00)
GFR, Estimated: 48 mL/min — ABNORMAL LOW (ref >60)
Glucose, Bld: 109 mg/dL — ABNORMAL HIGH (ref 70–99)
Potassium: 4.2 mmol/L (ref 3.5–5.1)
Sodium: 133 mmol/L — ABNORMAL LOW (ref 135–145)

## 2021-05-13 LAB — CBC
HCT: 25.8 % — ABNORMAL LOW (ref 36.0–46.0)
Hemoglobin: 8 g/dL — ABNORMAL LOW (ref 12.0–15.0)
MCH: 25.7 pg — ABNORMAL LOW (ref 26.0–34.0)
MCHC: 31 g/dL (ref 30.0–36.0)
MCV: 83 fL (ref 80.0–100.0)
Platelets: 424 10*3/uL — ABNORMAL HIGH (ref 150–400)
RBC: 3.11 MIL/uL — ABNORMAL LOW (ref 3.87–5.11)
RDW: 18.4 % — ABNORMAL HIGH (ref 11.5–15.5)
WBC: 8.3 10*3/uL (ref 4.0–10.5)
nRBC: 0 % (ref 0.0–0.2)

## 2021-05-13 MED ORDER — ADULT MULTIVITAMIN W/MINERALS CH
1.0000 | ORAL_TABLET | Freq: Every day | ORAL | Status: DC
  Administered 2021-05-14: 10:00:00 1 via ORAL
  Filled 2021-05-13: qty 1

## 2021-05-13 MED ORDER — BOOST / RESOURCE BREEZE PO LIQD CUSTOM
1.0000 | Freq: Three times a day (TID) | ORAL | Status: DC
  Administered 2021-05-13 – 2021-05-14 (×3): 1 via ORAL

## 2021-05-13 NOTE — Progress Notes (Signed)
PROGRESS NOTE    Gabriela [name]  VQQ:[id number] DOB: 03/11/1932 DOA: 05/12/2021 PCP: [name], MD   Brief Narrative:  Patient is an 87 year old elderly Caucasian female with a past medical history significant for but not limited to atrial fibrillation on anticoagulation with warfarin, GERD, history of bladder cancer about 15 years ago, history of breast cancer, recent diagnosis colon cancer with recent ostomy placed on 04/06/2021, hypothyroidism, chronic anemia as well as other comorbidities who presented emergency department on 05/12/2021 with generalized weakness and change in her urine.  She is recently treated for an UTI in the outpatient setting and was placed on ciprofloxacin but failed antibiotic treatment.  Upon arrival to the ED she was noted to have a low blood pressure at home and she noted that it was 60/38 on 05/11/2021 and was still low on the day of admission.  She states that she felt weak since her surgery 04/06/2021 but has worsened in the last week or so and weakness is generalized.  Symptoms were alleviated and exacerbated by nothing.  She does report that she had hematuria previously but none now.  She did endorse of breath pubic abdominal pain for over a week and burning in her urine.  She is started on ciprofloxacin on 05/06/2021 and finished a course on 05/10/2021.  In the ED she presented and was noted to have a supratherapeutic INR and was given IV fluid hydration as well as had urine cultures and blood cultures drawn.  She is initiated on IV ceftriaxone   Assessment & Plan:   Principal Problem:   Acute cystitis with hematuria Active Problems:   Hypothyroidism   Atrial fibrillation, chronic (HCC)   Normocytic anemia   Hyponatremia   AKI (acute kidney injury) ([place])   Colostomy in place [facility])   Dehydration   Supratherapeutic INR  Acute UTI/Cystitis w/o Hematuria, poA failed outpatient Abx -Failed outpatient management with po Ciprofloxacin  -Urinalysis on  05/05/2021 showed a turbid appearance with small hemoglobin, 5 ketones, small leukocytes, and negative nitrites with many bacteria and urine culture showing multiple species at that time -Repeat urinalysis showed cloudy appearance with yellow color urine, moderate hemoglobin, negative ketones, large leukocytes, negative nitrites, rare bacteria, 11-20 RBCs per high-power field, greater than 50 WBCs with repeat urine culture pending and blood culture x2 pending -Empirically placed on IV antibiotics with IV ceftriaxone and will continue -WBC went from 17.1 -> 11.8 -> 8.3 -Follow blood and urine cultures -Will need PT/OT to further evaluate and treat   Hyponatremia -Mild and in the setting of Dehydration -Na+ is 131 -> 133 -C/w IVF with NS but reduce the rate from 125 mL/hr to 75 mL/hr  Metabolic Acidosis -Mild.  CO2 is 19, anion gap is 3, chloride level is 111 -Continue with IV fluid hydration as above -Continue to Monitor and Trend -Repeat CMP in the AM  Thrombocytosis -Mild and Reactive in the setting of UTI infection -Platelet Count went from 552 -> 545 -> 424 -Continue to Monitor and Trend -Repeat CBC in the AM   Atrial Fibrillation -C/w Metoprolol Tartrate 2.5 mg IV q6h -Hold Coumadin   Supratherapeutic INR -Patient's PT/INR went from 20.3/1.7 -> 41.5/4.3 -> 49.3/5.4 respectively  -Had a recent course of Ciprofloxacin which could have increased her INR -Continue to Monitor for S/Sx of Bleeding; No overt bleeding noted -Repeat INR in the AM and Pharmacy to dose and Monitor  -Goal INR 2-3  Dehydration -C/w IVF as above -Seems to be improving  Hypothyroidism -C/w Levothyroxine 112 mcg po Daily   Normocytic Anemia -Patient's Hgb/Hct went from 8.2/25.2 -> 9.1/30.3 -> 8.0/25.8 -Check Anemia Panel -Continue to Monitor for S/Sx of Bleeding; No overt bleeding  -Repeat CMP in the AM   Colostomy  -C/w Ostomy Care   Depression and Anxiety -C/w Sertaline 50 mg po qHS and  with Alprazolam 0.125 mg po qHSprn  HTN -C/w Irbesartan 300 mg po Daily and Amlodipine 5 mg po Daily   GERD/GI Prophylaxis -C/w Pantoprazole 40 mg po BID   Non-Severe (Moderate) Malnutrition in the Context of Chronic illness -Nutritionist consulted for further evaluation and recommendations -C/w Diet as ordered and encourage po intake -C/w Boost Breeze Supplements po TID and with MVI+Minerals Daily   DVT prophylaxis: SCDs given her supratherapeutic INR Code Status: FULL CODE  Family Communication: No family present at bedside  Disposition Plan:   Status is: Observation  The patient will require care spanning > 2 midnights and should be moved to inpatient because: Pending PT OT evaluations to determine safe discharge disposition   Consultants:  None  Procedures: None  Antimicrobials:  Anti-infectives (From admission, onward)    Start     Dose/Rate Route Frequency Ordered Stop   05/12/21 1645  cefTRIAXone (ROCEPHIN) 1 g in sodium chloride 0.9 % 100 mL IVPB        1 g 200 mL/hr over 30 Minutes Intravenous  Once 05/12/21 1633 05/12/21 2041        Subjective: Seen and examined at bedside and she was still feeling weak.  States that she had a lot of burning and discomfort in her urine last night.  No nausea or vomiting.  Denies any lightheadedness or dizziness.  No other concerns or plans at this time.  Objective: Vitals:   05/12/21 1830 05/12/21 1900 05/12/21 2132 05/13/21 0641  BP: (!) 105/52 (!) 106/47 (!) 101/50 (!) 116/51  Pulse: 78 77 75 86  Resp: 15 17 16 18   Temp:  98.3 F (36.8 C) 97.9 F (36.6 C) 97.6 F (36.4 C)  TempSrc:  Oral Oral Oral  SpO2: 100% 100% 97% 99%  Weight:      Height:        Intake/Output Summary (Last 24 hours) at 05/13/2021 9242 Last data filed at 05/13/2021 0700 Gross per 24 hour  Intake 867.32 ml  Output --  Net 867.32 ml   Filed Weights   05/12/21 1454  Weight: 60.8 kg   Examination: Physical Exam:  Constitutional:  WN/WD thin chronically ill-appearing Caucasian female currently in no acute distress appears calm Eyes: Lids and conjunctivae normal, sclerae anicteric  ENMT: External Ears, Nose appear normal. Grossly normal hearing. Mucous membranes are moist.  Neck: Appears normal, supple, no cervical masses, normal ROM, no appreciable thyromegaly; no JVD Respiratory: Diminished to auscultation bilaterally, no wheezing, rales, rhonchi or crackles. Normal respiratory effort and patient is not tachypenic. No accessory muscle use.  Unlabored breathing Cardiovascular: RRR, no murmurs / rubs / gallops. S1 and S2 auscultated.  Abdomen: Soft, non-tender, non-distended. Bowel sounds positive.  Has an ostomy in place and an abdominal incision scars are clean dry and intact GU: Deferred. Musculoskeletal: No clubbing / cyanosis of digits/nails. No joint deformity upper and lower extremities. Skin: No rashes, lesions, ulcers on limited skin evaluation. No induration; Warm and dry.  Neurologic: CN 2-12 grossly intact with no focal deficits. Romberg sign and cerebellar reflexes not assessed.  Psychiatric: Normal judgment and insight. Alert and oriented x 3. Normal mood and appropriate  affect.   Data Reviewed: I have personally reviewed following labs and imaging studies  CBC: Recent Labs  Lab 05/12/21 1513 05/13/21 0422  WBC 11.8* 8.3  HGB 9.1* 8.0*  HCT 30.3* 25.8*  MCV 84.2 83.0  PLT 545* 174*   Basic Metabolic Panel: Recent Labs  Lab 05/12/21 1513 05/13/21 0422  NA 131* 133*  K 4.5 4.2  CL 104 111  CO2 19* 19*  GLUCOSE 114* 109*  BUN 31* 25*  CREATININE 1.39* 1.12*  CALCIUM 9.1 8.1*   GFR: Estimated Creatinine Clearance: 29.3 mL/min (A) (by C-G formula based on SCr of 1.12 mg/dL (H)). Liver Function Tests: No results for input(s): AST, ALT, ALKPHOS, BILITOT, PROT, ALBUMIN in the last 168 hours. No results for input(s): LIPASE, AMYLASE in the last 168 hours. No results for input(s): AMMONIA in the  last 168 hours. Coagulation Profile: Recent Labs  Lab 05/12/21 1513 05/13/21 0422  INR 4.3* 5.4*   Cardiac Enzymes: No results for input(s): CKTOTAL, CKMB, CKMBINDEX, TROPONINI in the last 168 hours. BNP (last 3 results) No results for input(s): PROBNP in the last 8760 hours. HbA1C: No results for input(s): HGBA1C in the last 72 hours. CBG: No results for input(s): GLUCAP in the last 168 hours. Lipid Profile: No results for input(s): CHOL, HDL, LDLCALC, TRIG, CHOLHDL, LDLDIRECT in the last 72 hours. Thyroid Function Tests: No results for input(s): TSH, T4TOTAL, FREET4, T3FREE, THYROIDAB in the last 72 hours. Anemia Panel: No results for input(s): VITAMINB12, FOLATE, FERRITIN, TIBC, IRON, RETICCTPCT in the last 72 hours. Sepsis Labs: No results for input(s): PROCALCITON, LATICACIDVEN in the last 168 hours.  Recent Results (from the past 240 hour(s))  Urine Culture     Status: Abnormal   Collection Time: 05/05/21 10:41 AM   Specimen: Urine, Random  Result Value Ref Range Status   Specimen Description   Final    URINE, RANDOM Performed at [facility], [address] [place], [place] [address]    Special Requests   Final    NONE Performed at [facility], [address] [place], [place] [address]    Culture MULTIPLE SPECIES PRESENT, SUGGEST RECOLLECTION (A)  Final   Report Status 05/06/2021 FINAL  Final  Blood culture (routine x 2)     Status: None (Preliminary result)   Collection Time: 05/12/21  5:54 PM   Specimen: BLOOD  Result Value Ref Range Status   Specimen Description BLOOD RIGHT ANTECUBITAL  Final   Special Requests   Final    BOTTLES DRAWN AEROBIC AND ANAEROBIC Blood Culture results may not be optimal due to an excessive volume of blood received in culture bottles   Culture   Final    NO GROWTH < 24 HOURS Performed at [facility], [address], [place], [place] [address]    Report Status PENDING  Incomplete  Blood culture  (routine x 2)     Status: None (Preliminary result)   Collection Time: 05/12/21  5:54 PM   Specimen: BLOOD  Result Value Ref Range Status   Specimen Description BLOOD LEFT ANTECUBITAL  Final   Special Requests   Final    BOTTLES DRAWN AEROBIC AND ANAEROBIC Blood Culture results may not be optimal due to an excessive volume of blood received in culture bottles   Culture   Final    NO GROWTH < 24 HOURS Performed at [facility], [address] [place], [place] [address]    Report Status PENDING  Incomplete  Resp Panel by RT-PCR (Flu A&B, Covid) Nasopharyngeal Swab     Status: None   Collection Time: 05/12/21  5:54 PM   Specimen: Nasopharyngeal Swab; Nasopharyngeal(NP) swabs in vial transport medium  Result Value Ref Range Status   SARS Coronavirus 2 by RT PCR NEGATIVE NEGATIVE Final    Comment: (NOTE) SARS-CoV-2 target nucleic acids are NOT DETECTED.  The SARS-CoV-2 RNA is generally detectable in upper respiratory specimens during the acute phase of infection. The lowest concentration of SARS-CoV-2 viral copies this assay can detect is 138 copies/mL. A negative result does not preclude SARS-Cov-2 infection and should not be used as the sole basis for treatment or other patient management decisions. A negative result may occur with  improper specimen collection/handling, submission of specimen other than nasopharyngeal swab, presence of viral mutation(s) within the areas targeted by this assay, and inadequate number of viral copies(<138 copies/mL). A negative result must be combined with clinical observations, patient history, and epidemiological information. The expected result is Negative.  Fact Sheet for Patients:  EntrepreneurPulse.com.au  Fact Sheet for Healthcare Providers:  IncredibleEmployment.be  This test is no t yet approved or cleared by the [place] FDA and  has been authorized for detection and/or diagnosis of  SARS-CoV-2 by FDA under an Emergency Use Authorization (EUA). This EUA will remain  in effect (meaning this test can be used) for the duration of the COVID-19 declaration under Section 564(b)(1) of the Act, 21 U.S.C.section 360bbb-3(b)(1), unless the authorization is terminated  or revoked sooner.       Influenza A by PCR NEGATIVE NEGATIVE Final   Influenza B by PCR NEGATIVE NEGATIVE Final    Comment: (NOTE) The Xpert Xpress SARS-CoV-2/FLU/RSV plus assay is intended as an aid in the diagnosis of influenza from Nasopharyngeal swab specimens and should not be used as a sole basis for treatment. Nasal washings and aspirates are unacceptable for Xpert Xpress SARS-CoV-2/FLU/RSV testing.  Fact Sheet for Patients: EntrepreneurPulse.com.au  Fact Sheet for Healthcare Providers: IncredibleEmployment.be  This test is not yet approved or cleared by the [place] FDA and has been authorized for detection and/or diagnosis of SARS-CoV-2 by FDA under an Emergency Use Authorization (EUA). This EUA will remain in effect (meaning this test can be used) for the duration of the COVID-19 declaration under Section 564(b)(1) of the Act, 21 U.S.C. section 360bbb-3(b)(1), unless the authorization is terminated or revoked.  Performed at [facility], [address] [place], [place] [address]    RN Pressure Injury Documentation:     Estimated body mass index is 23.74 kg/m as calculated from the following:   Height as of this encounter: 5\' 3"  (1.6 m).   Weight as of this encounter: 60.8 kg.  Malnutrition Type:   Malnutrition Characteristics:   Nutrition Interventions:    Radiology Studies: No results found.  Scheduled Meds:  amLODipine  5 mg Oral Daily   feeding supplement  237 mL Oral TID BM   irbesartan  300 mg Oral Daily   levothyroxine  112 mcg Oral Q0600   nystatin  5 mL Oral QID   pantoprazole  40 mg Oral BID   sertraline  50 mg  Oral QHS   Warfarin - Pharmacist Dosing Inpatient   Does not apply q1600   Continuous Infusions:  sodium chloride 125 mL/hr at 05/13/21 0202    LOS: 0 days   Kerney Elbe, DO [facility] PAGER is on [place]  If 7PM-7AM, please contact night-coverage [email or web address]

## 2021-05-13 NOTE — Progress Notes (Signed)
PT Cancellation Note  Patient Details Name: SAMAYRA HEBEL MRN: [id number] DOB: May 22, 1932   Cancelled Treatment:    Reason Eval/Treat Not Completed: Medical issues which prohibited therapy.  Pt order received and chart reviewed.  Pt currently has INR of 5.4 which is contraindicated for therapy at this time.  Will continue to monitor and will attempt evaluation as medically appropriate.   Gwenlyn Saran, PT, DPT 05/13/21, 10:06 AM

## 2021-05-13 NOTE — Progress Notes (Addendum)
INR is 5.4 this morning. No bleeding/Bruining or any hematoma noted.  Notified Dr. Damita Dunnings without any new order.

## 2021-05-13 NOTE — Progress Notes (Signed)
Initial Nutrition Assessment  DOCUMENTATION CODES:  Non-severe (moderate) malnutrition in context of chronic illness  INTERVENTION:  Continue current diet as ordered, encourage PO intake Change supplements to Boost Breeze po TID, each supplement provides 250 kcal and 9 grams of protein MVI with minerals daily  NUTRITION DIAGNOSIS:  Moderate Malnutrition related to chronic illness (cancer) as evidenced by mild fat depletion, percent weight loss, mild muscle depletion, moderate muscle depletion (10.3% x 6 months).  GOAL:  Patient will meet greater than or equal to 90% of their needs  MONITOR:  PO intake, Supplement acceptance, Labs, Weight trends  REASON FOR ASSESSMENT:  Malnutrition Screening Tool    ASSESSMENT:  87 y.o. female with history of HTN, stroke (2010), GERD, cancer (breast and bladder), atrial fibrillation, and hyperthyroidism, presented to ED with weakness and low blood pressure at home. Recently admitted with obstruction colon mass and had ileostomy placed.  Pt resting in bed at the time of assessment. Pt reports a poor appetite but that this has been ongoing since her surgery. States at home she has been eating meals, but is eating much less than prior. Does drink boost at home, does not like the ensure products. Discussed trying boost breeze, pt agreeable and prefers peach flavor.   Pt also reports that she has noticed a decline in her energy and functional status. States that over the last year or so she has lost 40 lbs unintentionally which was due to her undiagnosed cancer. Reviewed weight hx from PCP. 10.3% weight loss seen in the last 6 months (3/23-10/19). Pt reports that since last admission she has had to ambulate with walker where prior she was independent and still mowing her own yard. Pt hopeful she can go home tomorrow.   Reviewed RD notes from last admission, pt has lost muscle and fat stores. Encouraged pt to add more nutrition to meals and snacks she was  consuming at home.    Average Meal Intake: 10/19: 65% intake x 2 recorded meal  Nutritionally Relevant Medications: Scheduled Meds:  feeding supplement  237 mL Oral TID BM   levothyroxine  112 mcg Oral Q0600   pantoprazole  40 mg Oral BID   Warfarin - Pharmacist Dosing Inpatient   Does not apply q1600   Continuous Infusions:  sodium chloride 75 mL/hr at 05/13/21 0943   PRN Meds: bisacodyl, ondansetron, senna-docusate  Labs Reviewed: Na 133 BUN 25, creatinine 1.12 INR 5.4  NUTRITION - FOCUSED PHYSICAL EXAM: Flowsheet Row Most Recent Value  Orbital Region Mild depletion  Upper Arm Region Mild depletion  Thoracic and Lumbar Region Mild depletion  Buccal Region Mild depletion  Temple Region Mild depletion  Clavicle Bone Region Moderate depletion  Clavicle and Acromion Bone Region Moderate depletion  Scapular Bone Region Mild depletion  Dorsal Hand Mild depletion  Patellar Region Moderate depletion  Anterior Thigh Region Moderate depletion  Posterior Calf Region Moderate depletion  Edema (RD Assessment) None  Hair Reviewed  Eyes Reviewed  Mouth Reviewed  Skin Reviewed  Nails Reviewed   Diet Order:   Diet Order             Diet regular Room service appropriate? Yes; Fluid consistency: Thin  Diet effective now                   EDUCATION NEEDS:  Education needs have been addressed  Skin:  Skin Assessment: Reviewed RN Assessment  Last BM:  10/19 - type 6  Height:  Ht Readings from Last 1  Encounters:  05/12/21 5\' 3"  (1.6 m)   Weight:  Wt Readings from Last 1 Encounters:  05/12/21 60.8 kg   Ideal Body Weight:  52.3 kg  BMI:  Body mass index is 23.74 kg/m.  Estimated Nutritional Needs:  Kcal:  1600-1800 kcal/d Protein:  80-90 g/d Fluid:  1.5-1.8 L/d   Gabriela [name], RD, LDN Clinical Dietitian RD pager # available in [place]  After hours/weekend pager # available in [facility]

## 2021-05-13 NOTE — Consult Note (Addendum)
ANTICOAGULATION CONSULT NOTE - Follow Up Consult  Pharmacy Consult for Warfarin Indication: atrial fibrillation  Allergies  Allergen Reactions   Celebrex [Celecoxib] Anaphylaxis   Morphine And Related Swelling    Lips and mouth   Povidone-Iodine Other (See Comments)    Other reaction(s): ITCHING Other reaction(s): ITCHING    Sulfa Antibiotics     Patient Measurements: Height: 5\' 3"  (160 cm) Weight: 60.8 kg (134 lb) IBW/kg (Calculated) : 52.4  Vital Signs: Temp: 98.5 F (36.9 C) (10/19 1126) Temp Source: Oral (10/19 1126) BP: 116/46 (10/19 1126) Pulse Rate: 88 (10/19 1126)  Labs: Recent Labs    05/12/21 1513 05/13/21 0422  HGB 9.1* 8.0*  HCT 30.3* 25.8*  PLT 545* 424*  LABPROT 41.5* 49.3*  INR 4.3* 5.4*  CREATININE 1.39* 1.12*    Estimated Creatinine Clearance: 29.3 mL/min (A) (by C-G formula based on SCr of 1.12 mg/dL (H)).   Medications:  Warfarin regimen per pt on Med rec: 5mg  Daily (last dose 10/17)  Assessment: 87yo w/ h/o anxiety/MDD, GERD, HTN, Afib w/ prior CVA (2010), s/p robotic assisted laparoscopic subtotal colectomy with end ileostomy for obstructing transverse colon mass on 9/13 and now s/p ileostomy revision 9/21, breast / bladder cancer,  presents to the ED for generalized weakness and low blood pressure admitted with UTI. Pharmacy consulted for mgmt of [facility]. INR trending up supratherapeutic x 2 days (INR 4.3>5.4).   DDI: - Pt finished Cipro 10/12 - 10/16 course of abx outpatient.  - Pt received Ceftriaxone 1 g x 1 dose in ED (10/18).  - Both Quinolones and Cephalosporins may increase the anticoagulant effect of Warfarin.    Date INR Warfarin Dose  10/17   -                5 mg  10/18  4.3        Hold   10/19  5.4        Hold  Goal of Therapy:  INR 2-3 Monitor platelets by anticoagulation protocol: Yes   Plan:  Omit Warfarin dose tonight, reassess with AM INR. If INR >5, consider vitamin K 2.5 mg PO.      Gabriela [name], [place] PharmD  Candidate [phone]  05/13/2021,2:28 PM

## 2021-05-13 NOTE — Progress Notes (Signed)
OT Cancellation Note  Patient Details Name: Gabriela [name] MRN: [id number] DOB: [birth year]   Cancelled Treatment:    Reason Eval/Treat Not Completed: Medical issues which prohibited therapy. OT order received and chart reviewed. Pt with INR of 5.4 which is contraindicated for therapeutic intervention. OT to re-attempt when pt is able to safely participate.   Darleen Crocker, MS, OTR/L , CBIS ascom [phone]  05/13/21, 1:31 PM

## 2021-05-14 DIAGNOSIS — N3001 Acute cystitis with hematuria: Secondary | ICD-10-CM | POA: Diagnosis not present

## 2021-05-14 DIAGNOSIS — I482 Chronic atrial fibrillation, unspecified: Secondary | ICD-10-CM | POA: Diagnosis not present

## 2021-05-14 DIAGNOSIS — E440 Moderate protein-calorie malnutrition: Secondary | ICD-10-CM

## 2021-05-14 DIAGNOSIS — N179 Acute kidney failure, unspecified: Secondary | ICD-10-CM | POA: Diagnosis not present

## 2021-05-14 DIAGNOSIS — Z933 Colostomy status: Secondary | ICD-10-CM | POA: Diagnosis not present

## 2021-05-14 LAB — RETICULOCYTES
Immature Retic Fract: 27.9 % — ABNORMAL HIGH (ref 2.3–15.9)
RBC.: 3.02 MIL/uL — ABNORMAL LOW (ref 3.87–5.11)
Retic Count, Absolute: 55 10*3/uL (ref 19.0–186.0)
Retic Ct Pct: 1.8 % (ref 0.4–3.1)

## 2021-05-14 LAB — CBC WITH DIFFERENTIAL/PLATELET
Abs Immature Granulocytes: 0.06 10*3/uL (ref 0.00–0.07)
Basophils Absolute: 0 10*3/uL (ref 0.0–0.1)
Basophils Relative: 0 %
Eosinophils Absolute: 0.1 10*3/uL (ref 0.0–0.5)
Eosinophils Relative: 1 %
HCT: 25.1 % — ABNORMAL LOW (ref 36.0–46.0)
Hemoglobin: 7.9 g/dL — ABNORMAL LOW (ref 12.0–15.0)
Immature Granulocytes: 1 %
Lymphocytes Relative: 21 %
Lymphs Abs: 1.6 10*3/uL (ref 0.7–4.0)
MCH: 25.6 pg — ABNORMAL LOW (ref 26.0–34.0)
MCHC: 31.5 g/dL (ref 30.0–36.0)
MCV: 81.5 fL (ref 80.0–100.0)
Monocytes Absolute: 0.7 10*3/uL (ref 0.1–1.0)
Monocytes Relative: 10 %
Neutro Abs: 5.1 10*3/uL (ref 1.7–7.7)
Neutrophils Relative %: 67 %
Platelets: 433 10*3/uL — ABNORMAL HIGH (ref 150–400)
RBC: 3.08 MIL/uL — ABNORMAL LOW (ref 3.87–5.11)
RDW: 18.6 % — ABNORMAL HIGH (ref 11.5–15.5)
WBC: 7.5 10*3/uL (ref 4.0–10.5)
nRBC: 0 % (ref 0.0–0.2)

## 2021-05-14 LAB — COMPREHENSIVE METABOLIC PANEL
ALT: 6 U/L (ref 0–44)
AST: 9 U/L — ABNORMAL LOW (ref 15–41)
Albumin: 2.2 g/dL — ABNORMAL LOW (ref 3.5–5.0)
Alkaline Phosphatase: 64 U/L (ref 38–126)
Anion gap: 5 (ref 5–15)
BUN: 14 mg/dL (ref 8–23)
CO2: 19 mmol/L — ABNORMAL LOW (ref 22–32)
Calcium: 8.1 mg/dL — ABNORMAL LOW (ref 8.9–10.3)
Chloride: 115 mmol/L — ABNORMAL HIGH (ref 98–111)
Creatinine, Ser: 0.73 mg/dL (ref 0.44–1.00)
GFR, Estimated: >60 mL/min (ref >60)
Glucose, Bld: 105 mg/dL — ABNORMAL HIGH (ref 70–99)
Potassium: 3.9 mmol/L (ref 3.5–5.1)
Sodium: 139 mmol/L (ref 135–145)
Total Bilirubin: 0.4 mg/dL (ref 0.3–1.2)
Total Protein: 6 g/dL — ABNORMAL LOW (ref 6.5–8.1)

## 2021-05-14 LAB — VITAMIN B12: Vitamin B-12: 240 pg/mL (ref 180–914)

## 2021-05-14 LAB — IRON AND TIBC
Iron: 10 ug/dL — ABNORMAL LOW (ref 28–170)
Saturation Ratios: 5 % — ABNORMAL LOW (ref 10.4–31.8)
TIBC: 197 ug/dL — ABNORMAL LOW (ref 250–450)
UIBC: 187 ug/dL

## 2021-05-14 LAB — PROTIME-INR
INR: 6.7 (ref 0.8–1.2)
Prothrombin Time: 58.4 seconds — ABNORMAL HIGH (ref 11.4–15.2)

## 2021-05-14 LAB — FERRITIN: Ferritin: 47 ng/mL (ref 11–307)

## 2021-05-14 LAB — FOLATE: Folate: 9.8 ng/mL (ref >5.9)

## 2021-05-14 LAB — MAGNESIUM: Magnesium: 1.9 mg/dL (ref 1.7–2.4)

## 2021-05-14 LAB — PHOSPHORUS: Phosphorus: 3 mg/dL (ref 2.5–4.6)

## 2021-05-14 MED ORDER — PHYTONADIONE 5 MG PO TABS
2.5000 mg | ORAL_TABLET | Freq: Once | ORAL | Status: DC
  Filled 2021-05-14: qty 1

## 2021-05-14 MED ORDER — CEPHALEXIN 500 MG PO CAPS: 500.0000 mg | ORAL_CAPSULE | Freq: Two times a day (BID) | ORAL | 0 refills | Status: AC

## 2021-05-14 MED ORDER — ADULT MULTIVITAMIN W/MINERALS CH: 1.0000 | ORAL_TABLET | Freq: Every day | ORAL | 0 refills | Status: DC

## 2021-05-14 MED ORDER — WARFARIN SODIUM 5 MG PO TABS: 5.0000 mg | ORAL_TABLET | Freq: Every day | ORAL | Status: DC

## 2021-05-14 MED ORDER — POLYSACCHARIDE IRON COMPLEX 150 MG PO CAPS
150.0000 mg | ORAL_CAPSULE | Freq: Every day | ORAL | 0 refills | Status: DC
Start: 2021-05-15 — End: 2023-04-07

## 2021-05-14 MED ORDER — ONDANSETRON HCL 4 MG PO TABS: 4.0000 mg | ORAL_TABLET | Freq: Four times a day (QID) | ORAL | 0 refills | Status: DC | PRN

## 2021-05-14 MED ORDER — SODIUM CHLORIDE 0.9 % IV SOLN
1.0000 g | INTRAVENOUS | Status: DC
  Administered 2021-05-14: 13:00:00 1 g via INTRAVENOUS
  Filled 2021-05-14: qty 10
  Filled 2021-05-14: qty 1

## 2021-05-14 MED ORDER — POLYSACCHARIDE IRON COMPLEX 150 MG PO CAPS
150.0000 mg | ORAL_CAPSULE | Freq: Every day | ORAL | Status: DC
  Administered 2021-05-14: 13:00:00 150 mg via ORAL
  Filled 2021-05-14: qty 1

## 2021-05-14 NOTE — Progress Notes (Signed)
Changed ostomy appliance to abdomen. Area is clean. Skin around stoma is excoriated, cleansed with skin barrier. Area was tender to touch but cleansed with ease. Patient worked with PT and got up to chair with walker. Call bell in reach and bed in low position. Family at bedside

## 2021-05-14 NOTE — Progress Notes (Signed)
OT Cancellation Note  Patient Details Name: Gabriela [name] MRN: [id number] DOB: 1931/09/30   Cancelled Treatment:    Reason Eval/Treat Not Completed: Patient declined, no reason specified. Consult received chart reviewed. Pt INR 6.7, however, per secure chat MD cleared to participate with therapy despite INR. Upon attempt, pt dressed, in recliner, and eager to discharge soon. Pt reports, "I'm just waiting for them to give me the paperwork and take me downstairs." Pt politely declined OT evaluation today given she is leaving. Will re-attempt next date if she does not discharge as planned. Pt already set up with [facility] services upon discharge.   Ardeth Perfect., MPH, MS, OTR/L ascom [phone] 05/14/21, 3:28 PM

## 2021-05-14 NOTE — Consult Note (Addendum)
ANTICOAGULATION CONSULT NOTE - Follow Up Consult  Pharmacy Consult for Warfarin Indication: atrial fibrillation  Allergies  Allergen Reactions   Celebrex [Celecoxib] Anaphylaxis   Morphine And Related Swelling    Lips and mouth   Povidone-Iodine Other (See Comments)    Other reaction(s): ITCHING Other reaction(s): ITCHING    Sulfa Antibiotics     Patient Measurements: Height: 5\' 3"  (160 cm) Weight: 60.8 kg (134 lb) IBW/kg (Calculated) : 52.4  Vital Signs: Temp: 98.3 F (36.8 C) (10/20 0620) Temp Source: Oral (10/20 0620) BP: 113/51 (10/20 0620) Pulse Rate: 77 (10/20 0620)  Labs: Recent Labs    05/12/21 1513 05/13/21 0422 05/14/21 0520  HGB 9.1* 8.0* 7.9*  HCT 30.3* 25.8* 25.1*  PLT 545* 424* 433*  LABPROT 41.5* 49.3* 58.4*  INR 4.3* 5.4* 6.7*  CREATININE 1.39* 1.12* 0.73     Estimated Creatinine Clearance: 41 mL/min (by C-G formula based on SCr of 0.73 mg/dL).   Medications:  Warfarin regimen per pt on Med rec: 5mg  Daily (last dose 10/17)  Assessment: 87yo w/ h/o anxiety/MDD, GERD, HTN, Afib w/ prior CVA (2010), s/p robotic assisted laparoscopic subtotal colectomy with end ileostomy for obstructing transverse colon mass on 9/13 and now s/p ileostomy revision 9/21, breast / bladder cancer,  presents to the ED for generalized weakness and low blood pressure admitted with UTI. Pharmacy consulted for mgmt of [facility]. INR trending up supratherapeutic x 3 days (INR 4.3>5.4>6.7).   DDI: - Pt finished Cipro 10/12 - 10/16 course of abx outpatient.  - Pt received Ceftriaxone 1 g x 1 dose in ED (10/18). Attending decided to continue therapy today 10/20. - (10/20 >> )Pt cetriaxone 1 g IV q 24h. - Both Quinolones and Cephalosporins may increase the anticoagulant effect of Warfarin.    Date INR Warfarin Dose  10/17   -                5 mg  10/18   4.3        Hold   10/19   5.4        Hold 10/20   6.7            Hold  Goal of Therapy:  INR 2-3 Monitor platelets by  anticoagulation protocol: Yes   Plan:  Omit Warfarin dose tonight, reassess with AM INR. If INR >5, consider vitamin K 2.5 mg PO.  Hematuria has resolved. No further bleeding noted by nursing. Per attending he would like to hold off on VitK oral tx for now.  Resume Warfarin when INR <3.      [place] Roylene Heaton, [employer] PharmD Candidate 23'  05/14/2021,2:24 PM

## 2021-05-14 NOTE — Discharge Summary (Signed)
Physician Discharge Summary  ARLO BUFFONE GLO:[id number] DOB: April 15, 1932 DOA: 05/12/2021  PCP: [name], MD  Admit date: 05/12/2021 Discharge date: 05/14/2021  Admitted From: Home Disposition: Home with Home Health PT/OT/RN  Recommendations for Outpatient Follow-up:  Follow up with PCP in 1-2 weeks Follow up with Cardiology in the AM for INR Check  Please obtain CMP/CBC/Mag/Phos in one week Please follow up on the following pending results: Repeat urine culture  Home Health: Yes  Equipment/Devices: None    Discharge Condition: Stable  CODE STATUS: DO NOT RESUSCITATE Diet recommendation: Heart Healthy Diet   Brief/Interim Summary: Brief Narrative:  Patient is an 87 year old elderly Caucasian female with a past medical history significant for but not limited to atrial fibrillation on anticoagulation with warfarin, GERD, history of bladder cancer about 15 years ago, history of breast cancer, recent diagnosis colon cancer with recent ostomy placed on 04/06/2021, hypothyroidism, chronic anemia as well as other comorbidities who presented emergency department on 05/12/2021 with generalized weakness and change in her urine.  She is recently treated for an UTI in the outpatient setting and was placed on ciprofloxacin but failed antibiotic treatment.  Upon arrival to the ED she was noted to have a low blood pressure at home and she noted that it was 60/38 on 05/11/2021 and was still low on the day of admission.  She states that she felt weak since her surgery 04/06/2021 but has worsened in the last week or so and weakness is generalized.  Symptoms were alleviated and exacerbated by nothing.  She does report that she had hematuria previously but none now.  She did endorse of breath pubic abdominal pain for over a week and burning in her urine.  She is started on ciprofloxacin on 05/06/2021 and finished a course on 05/10/2021.  In the ED she presented and was noted to have a supratherapeutic  INR and was given IV fluid hydration as well as had urine cultures and blood cultures drawn.  She is initiated on IV ceftriaxone and this was continued and she is transition to p.o. Keflex.  INR continue to trend upwards and was 6.7 today prior to discharge. Blood cultures showed no growth to date less than 2 days however unfortunately urine culture showed multiple species again so this was recollected prior to discharge.  Patient had no active signs and symptoms of bleeding and her hematoma has resolved and was only very scant.  A decision was made not to give the patient vitamin K and she will follow-up with her primary cardiologist team tomorrow morning for an INR check.  PT OT recommended home health and she is deemed medically stable to be discharged at this time if she is improved.  No other concerns or complaints and all questions were answered to the patient and patient's daughter satisfaction.   Discharge Diagnoses:  Principal Problem:   Acute cystitis with hematuria Active Problems:   Hypothyroidism   Atrial fibrillation, chronic (HCC)   Normocytic anemia   Hyponatremia   AKI (acute kidney injury) ([place])   Colostomy in place [facility])   Dehydration   Supratherapeutic INR   Malnutrition of moderate degree  Acute UTI/Cystitis w/o Hematuria, poA failed outpatient Abx -Failed outpatient management with po Ciprofloxacin and will not continue ciprofloxacin at discharge -Urinalysis on 05/05/2021 showed a turbid appearance with small hemoglobin, 5 ketones, small leukocytes, and negative nitrites with many bacteria and urine culture showing multiple species at that time -Repeat urinalysis showed cloudy appearance with yellow color urine,  moderate hemoglobin, negative ketones, large leukocytes, negative nitrites, rare bacteria, 11-20 RBCs per high-power field, greater than 50 WBCs with repeat urine culture showing multiple species again and blood culture x2 showed no growth to date at 2 days -A third  urine culture was obtained and still pending -Empirically placed on IV antibiotics with IV ceftriaxone and will continue and she is transition to p.o. Keflex given the minimal interaction between warfarin -WBC went from 17.1 -> 11.8 -> 8.3 and today was 7.5 -Follow blood and urine cultures -Will need PT/OT to further evaluate and treat and they are recommending home health with PT OT and RN   Hyponatremia -Mild and in the setting of Dehydration -Na+ is 131 -> 133 and improved to 139 -C/w IVF with NS but reduce the rate from 125 mL/hr to 75 mL/hr and continued prior to discharge   Metabolic Acidosis -Mild.  CO2 is 19, anion gap is 5, chloride level is 115 -Continue with IV fluid hydration as above -Continue to Monitor and Trend -Repeat CMP within 1 week   Thrombocytosis -Mild and Reactive in the setting of UTI infection -Platelet Count went from 552 -> 545 -> 424 and trended down to 433 -Continue to Monitor and Trend -Repeat CBC in the AM    Atrial Fibrillation -C/w Metoprolol Tartrate 2.5 mg IV q6h -Hold Coumadin still given her supratherapeutic INR   Supratherapeutic INR -Patient's PT/INR went from 20.3/1.7 -> 41.5/4.3 -> 49.3/5.4 respectively and persistently worsened and PT/INR was 58.4/6.7 today; because she has no evidence of any overt bleeding and she only had very scant hematuria will not give any reversal agents and does have a recheck in the morning -Had a recent course of Ciprofloxacin which could have increased her INR and also received some ceftriaxone but will change to p.o. Keflex for 5 more days -Continue to Monitor for S/Sx of Bleeding; No overt bleeding noted -Repeat INR in the AM and Pharmacy to dose and Monitor  -Goal INR 2-3 and resume warfarin when the INR is less than 3   Dehydration -C/w IVF as above -Seems to be improving    Hypothyroidism -C/w Levothyroxine 112 mcg po Daily    Normocytic Anemia -Patient's Hgb/Hct went from 8.2/25.2 -> 9.1/30.3 ->  8.0/25.8 and today was 7.9/25.1 relatively stable -Checked anemia panel and showed an iron level of 10, U IBC 187, TIBC of 197, saturation ratios of 5%, ferritin level 47, folate level 9.8 -Started the patient on Niferex supplementation -Continue to Monitor for S/Sx of Bleeding; No overt bleeding  -Repeat CMP within 1 week   Colostomy  -C/w Ostomy Care   Depression and Anxiety -C/w Sertaline 50 mg po qHS and with Alprazolam 0.125 mg po qHSprn   HTN -C/w Irbesartan 300 mg po Daily and Amlodipine 5 mg po Daily   GERD/GI Prophylaxis -C/w Pantoprazole 40 mg po BID    Non-Severe (Moderate) Malnutrition in the Context of Chronic illness -Nutritionist consulted for further evaluation and recommendations -C/w Diet as ordered and encourage po intake -C/w Boost Breeze Supplements po TID and with MVI+Minerals Daily    Discharge Instructions  Discharge Instructions     Call MD for:  difficulty breathing, headache or visual disturbances   Complete by: As directed    Call MD for:  extreme fatigue   Complete by: As directed    Call MD for:  hives   Complete by: As directed    Call MD for:  persistant dizziness or light-headedness   Complete by:  As directed    Call MD for:  persistant nausea and vomiting   Complete by: As directed    Call MD for:  redness, tenderness, or signs of infection (pain, swelling, redness, odor or green/yellow discharge around incision site)   Complete by: As directed    Call MD for:  severe uncontrolled pain   Complete by: As directed    Call MD for:  temperature >100.4   Complete by: As directed    Diet - low sodium heart healthy   Complete by: As directed    Discharge instructions   Complete by: As directed    You were cared for by a hospitalist during your hospital stay. If you have any questions about your discharge medications or the care you received while you were in the hospital after you are discharged, you can call the unit and ask to speak with the  hospitalist on call if the hospitalist that took care of you is not available. Once you are discharged, your primary care physician will handle any further medical issues. Please note that NO REFILLS for any discharge medications will be authorized once you are discharged, as it is imperative that you return to your primary care physician (or establish a relationship with a primary care physician if you do not have one) for your aftercare needs so that they can reassess your need for medications and monitor your lab values.  Follow up with PCP within 1 week and have repeat Urine Cx followed and Cardiology for INR Check in the AM. Take all medications as prescribed. If symptoms change or worsen please return to the ED for evaluation   Increase activity slowly   Complete by: As directed    No wound care   Complete by: As directed       Allergies as of 05/14/2021       Reactions   Celebrex [celecoxib] Anaphylaxis   Morphine And Related Swelling   Lips and mouth   Povidone-iodine Other (See Comments)   Other reaction(s): ITCHING Other reaction(s): ITCHING   Sulfa Antibiotics         Medication List     TAKE these medications    acetaminophen 325 MG tablet Commonly known as: TYLENOL Take 2 tablets (650 mg total) by mouth every 6 (six) hours as needed for mild pain or fever.   albuterol 108 (90 Base) MCG/ACT inhaler Commonly known as: VENTOLIN HFA 2 puffs q.i.d. p.r.n. short of breath, wheezing, or cough   ALPRAZolam 0.25 MG tablet Commonly known as: XANAX Take 0.5 tablets (0.125 mg total) by mouth at bedtime as needed for anxiety.   amLODipine 5 MG tablet Commonly known as: NORVASC Take 5 mg by mouth daily.   cephALEXin 500 MG capsule Commonly known as: KEFLEX Take 1 capsule (500 mg total) by mouth 2 (two) times daily for 5 days.   feeding supplement Liqd Take 237 mLs by mouth 3 (three) times daily between meals.   iron polysaccharides 150 MG capsule Commonly known as:  NIFEREX Take 1 capsule (150 mg total) by mouth daily. Start taking on: May 15, 2021   levothyroxine 112 MCG tablet Commonly known as: SYNTHROID Take 112 mcg by mouth daily before breakfast.   multivitamin with minerals Tabs tablet Take 1 tablet by mouth daily. Start taking on: May 15, 2021   nystatin 100000 UNIT/ML suspension Commonly known as: MYCOSTATIN Take 5 mLs by mouth 4 (four) times daily.   ondansetron 4 MG tablet Commonly known as:  ZOFRAN Take 1 tablet (4 mg total) by mouth every 6 (six) hours as needed for nausea or vomiting (FIRST option for nausea/vomiting if can tolerate oral medication.).   oxybutynin 5 MG tablet Commonly known as: DITROPAN Take 1 tablet (5 mg total) by mouth every 8 (eight) hours as needed for bladder spasms.   pantoprazole 40 MG tablet Commonly known as: PROTONIX Take 40 mg by mouth 2 (two) times daily.   sertraline 50 MG tablet Commonly known as: ZOLOFT Take 50 mg by mouth daily. hs   traMADol 50 MG tablet Commonly known as: ULTRAM Take 1 tablet (50 mg total) by mouth every 6 (six) hours as needed for moderate pain.   valsartan 320 MG tablet Commonly known as: DIOVAN Take 320 mg by mouth daily.   warfarin 5 MG tablet Commonly known as: COUMADIN Take 1 tablet (5 mg total) by mouth daily. HOLD WARFARIN UNTIL INR Improves and Restarted at the Direction of Cardiology Start taking on: May 21, 2021 What changed:  additional instructions These instructions start on May 21, 2021. If you are unsure what to do until then, ask your doctor or other care provider. Another medication with the same name was removed. Continue taking this medication, and follow the directions you see here.        Allergies  Allergen Reactions   Celebrex [Celecoxib] Anaphylaxis   Morphine And Related Swelling    Lips and mouth   Povidone-Iodine Other (See Comments)    Other reaction(s): ITCHING Other reaction(s): ITCHING    Sulfa  Antibiotics     Consultations: None   Procedures/Studies: No results found.  Subjective: Seen and examined at bedside and she is doing much better today.  Felt a lot stronger and denied any more burning or discomfort in her urine.  Her hematuria has resolved as she only had a very scant blood in her urine early this morning.  No overt bleeding noted.  She denies any other concerns or complaints this time is stable for discharge at this time and she will follow-up with her PCP and she was instructed to go follow-up with the [facility] clinic cardiology office for repeat INR check in the morning.  I called the [facility] clinic and they agreed to call the patient directly for an appointment  Discharge Exam: Vitals:   05/14/21 0010 05/14/21 0620  BP: (!) 119/53 (!) 113/51  Pulse: 76 77  Resp:  16  Temp: 98.8 F (37.1 C) 98.3 F (36.8 C)  SpO2: 99% 97%   Vitals:   05/13/21 1557 05/13/21 2119 05/14/21 0010 05/14/21 0620  BP: (!) 113/50 (!) 107/49 (!) 119/53 (!) 113/51  Pulse: 94 79 76 77  Resp: 18 16  16   Temp: 98.7 F (37.1 C) 98.6 F (37 C) 98.8 F (37.1 C) 98.3 F (36.8 C)  TempSrc: Oral Oral Oral Oral  SpO2: 96% 99% 99% 97%  Weight:      Height:       General: Pt is alert, awake, not in acute distress Cardiovascular: RRR, S1/S2 +, no rubs, no gallops Respiratory: Diminished bilaterally, no wheezing, no rhonchi; unlabored breathing Abdominal: Soft, NT, ND, bowel sounds + Extremities: no edema, no cyanosis  The results of significant diagnostics from this hospitalization (including imaging, microbiology, ancillary and laboratory) are listed below for reference.    Microbiology: Recent Results (from the past 240 hour(s))  Urine Culture     Status: Abnormal   Collection Time: 05/05/21 10:41 AM   Specimen: Urine, Random  Result Value Ref Range Status   Specimen Description   Final    URINE, RANDOM Performed at [facility], [address] [place], [place]  [address]    Special Requests   Final    NONE Performed at [facility], [address] [place], [place] [address]    Culture MULTIPLE SPECIES PRESENT, SUGGEST RECOLLECTION (A)  Final   Report Status 05/06/2021 FINAL  Final  Urine Culture     Status: Abnormal   Collection Time: 05/12/21  2:57 PM   Specimen: Urine, Random  Result Value Ref Range Status   Specimen Description   Final    URINE, RANDOM Performed at [facility], [address], [place], [place] [address]    Special Requests   Final    NONE Performed at [facility], [address] [place], [place] [address]    Culture MULTIPLE SPECIES PRESENT, SUGGEST RECOLLECTION (A)  Final   Report Status 05/13/2021 FINAL  Final  Blood culture (routine x 2)     Status: None (Preliminary result)   Collection Time: 05/12/21  5:54 PM   Specimen: BLOOD  Result Value Ref Range Status   Specimen Description BLOOD RIGHT ANTECUBITAL  Final   Special Requests   Final    BOTTLES DRAWN AEROBIC AND ANAEROBIC Blood Culture results may not be optimal due to an excessive volume of blood received in culture bottles   Culture   Final    NO GROWTH 2 DAYS Performed at [facility], [address] [place], [place] [address]    Report Status PENDING  Incomplete  Blood culture (routine x 2)     Status: None (Preliminary result)   Collection Time: 05/12/21  5:54 PM   Specimen: BLOOD  Result Value Ref Range Status   Specimen Description BLOOD LEFT ANTECUBITAL  Final   Special Requests   Final    BOTTLES DRAWN AEROBIC AND ANAEROBIC Blood Culture results may not be optimal due to an excessive volume of blood received in culture bottles   Culture   Final    NO GROWTH 2 DAYS Performed at [facility], [address], [place], [place] [address]    Report Status PENDING  Incomplete  Resp Panel by RT-PCR (Flu A&B, Covid) Nasopharyngeal Swab     Status: None   Collection Time: 05/12/21  5:54 PM    Specimen: Nasopharyngeal Swab; Nasopharyngeal(NP) swabs in vial transport medium  Result Value Ref Range Status   SARS Coronavirus 2 by RT PCR NEGATIVE NEGATIVE Final    Comment: (NOTE) SARS-CoV-2 target nucleic acids are NOT DETECTED.  The SARS-CoV-2 RNA is generally detectable in upper respiratory specimens during the acute phase of infection. The lowest concentration of SARS-CoV-2 viral copies this assay can detect is 138 copies/mL. A negative result does not preclude SARS-Cov-2 infection and should not be used as the sole basis for treatment or other patient management decisions. A negative result may occur with  improper specimen collection/handling, submission of specimen other than nasopharyngeal swab, presence of viral mutation(s) within the areas targeted by this assay, and inadequate number of viral copies(<138 copies/mL). A negative result must be combined with clinical observations, patient history, and epidemiological information. The expected result is Negative.  Fact Sheet for Patients:  EntrepreneurPulse.com.au  Fact Sheet for Healthcare Providers:  IncredibleEmployment.be  This test is no t yet approved or cleared by the [place] FDA and  has been authorized for detection and/or diagnosis of SARS-CoV-2 by  FDA under an Emergency Use Authorization (EUA). This EUA will remain  in effect (meaning this test can be used) for the duration of the COVID-19 declaration under Section 564(b)(1) of the Act, 21 U.S.C.section 360bbb-3(b)(1), unless the authorization is terminated  or revoked sooner.       Influenza A by PCR NEGATIVE NEGATIVE Final   Influenza B by PCR NEGATIVE NEGATIVE Final    Comment: (NOTE) The Xpert Xpress SARS-CoV-2/FLU/RSV plus assay is intended as an aid in the diagnosis of influenza from Nasopharyngeal swab specimens and should not be used as a sole basis for treatment. Nasal washings and aspirates are  unacceptable for Xpert Xpress SARS-CoV-2/FLU/RSV testing.  Fact Sheet for Patients: EntrepreneurPulse.com.au  Fact Sheet for Healthcare Providers: IncredibleEmployment.be  This test is not yet approved or cleared by the [place] FDA and has been authorized for detection and/or diagnosis of SARS-CoV-2 by FDA under an Emergency Use Authorization (EUA). This EUA will remain in effect (meaning this test can be used) for the duration of the COVID-19 declaration under Section 564(b)(1) of the Act, 21 U.S.C. section 360bbb-3(b)(1), unless the authorization is terminated or revoked.  Performed at [facility], [address] [place], [place] [address]     Labs: BNP (last 3 results) No results for input(s): BNP in the last 8760 hours. Basic Metabolic Panel: Recent Labs  Lab 05/12/21 1513 05/13/21 0422 05/14/21 0520  NA 131* 133* 139  K 4.5 4.2 3.9  CL 104 111 115*  CO2 19* 19* 19*  GLUCOSE 114* 109* 105*  BUN 31* 25* 14  CREATININE 1.39* 1.12* 0.73  CALCIUM 9.1 8.1* 8.1*  MG  --   --  1.9  PHOS  --   --  3.0   Liver Function Tests: Recent Labs  Lab 05/14/21 0520  AST 9*  ALT 6  ALKPHOS 64  BILITOT 0.4  PROT 6.0*  ALBUMIN 2.2*   No results for input(s): LIPASE, AMYLASE in the last 168 hours. No results for input(s): AMMONIA in the last 168 hours. CBC: Recent Labs  Lab 05/12/21 1513 05/13/21 0422 05/14/21 0520  WBC 11.8* 8.3 7.5  NEUTROABS  --   --  5.1  HGB 9.1* 8.0* 7.9*  HCT 30.3* 25.8* 25.1*  MCV 84.2 83.0 81.5  PLT 545* 424* 433*   Cardiac Enzymes: No results for input(s): CKTOTAL, CKMB, CKMBINDEX, TROPONINI in the last 168 hours. BNP: Invalid input(s): POCBNP CBG: No results for input(s): GLUCAP in the last 168 hours. D-Dimer No results for input(s): DDIMER in the last 72 hours. Hgb A1c No results for input(s): HGBA1C in the last 72 hours. Lipid Profile No results for input(s): CHOL, HDL,  LDLCALC, TRIG, CHOLHDL, LDLDIRECT in the last 72 hours. Thyroid function studies No results for input(s): TSH, T4TOTAL, T3FREE, THYROIDAB in the last 72 hours.  Invalid input(s): FREET3 Anemia work up Recent Labs    05/14/21 0520 05/14/21 0840  VITAMINB12  --  240  FOLATE 9.8  --   FERRITIN 47  --   TIBC 197*  --   IRON 10*  --   RETICCTPCT 1.8  --    Urinalysis    Component Value Date/Time   COLORURINE YELLOW (A) 05/12/2021 1513   APPEARANCEUR CLOUDY (A) 05/12/2021 1513   APPEARANCEUR Cloudy (A) 01/02/2019 1538   LABSPEC 1.013 05/12/2021 1513   LABSPEC 1.010 05/08/2013 1326   PHURINE 5.0 05/12/2021 1513   GLUCOSEU 50 (A) 05/12/2021 1513   GLUCOSEU NEGATIVE 05/08/2013 1326  HGBUR MODERATE (A) 05/12/2021 1513   BILIRUBINUR NEGATIVE 05/12/2021 1513   BILIRUBINUR Negative 01/02/2019 1538   BILIRUBINUR NEGATIVE 05/08/2013 1326   KETONESUR NEGATIVE 05/12/2021 1513   PROTEINUR 30 (A) 05/12/2021 1513   NITRITE NEGATIVE 05/12/2021 1513   LEUKOCYTESUR LARGE (A) 05/12/2021 1513   LEUKOCYTESUR 3+ 05/08/2013 1326   Sepsis Labs Invalid input(s): PROCALCITONIN,  WBC,  LACTICIDVEN Microbiology Recent Results (from the past 240 hour(s))  Urine Culture     Status: Abnormal   Collection Time: 05/05/21 10:41 AM   Specimen: Urine, Random  Result Value Ref Range Status   Specimen Description   Final    URINE, RANDOM Performed at [facility], [address], [place], [place] [address]    Special Requests   Final    NONE Performed at [facility], [address] [place], [place] [address]    Culture MULTIPLE SPECIES PRESENT, SUGGEST RECOLLECTION (A)  Final   Report Status 05/06/2021 FINAL  Final  Urine Culture     Status: Abnormal   Collection Time: 05/12/21  2:57 PM   Specimen: Urine, Random  Result Value Ref Range Status   Specimen Description   Final    URINE, RANDOM Performed at [facility], [address] [place], [place] [address]     Special Requests   Final    NONE Performed at [facility], [address] [place], [place] [address]    Culture MULTIPLE SPECIES PRESENT, SUGGEST RECOLLECTION (A)  Final   Report Status 05/13/2021 FINAL  Final  Blood culture (routine x 2)     Status: None (Preliminary result)   Collection Time: 05/12/21  5:54 PM   Specimen: BLOOD  Result Value Ref Range Status   Specimen Description BLOOD RIGHT ANTECUBITAL  Final   Special Requests   Final    BOTTLES DRAWN AEROBIC AND ANAEROBIC Blood Culture results may not be optimal due to an excessive volume of blood received in culture bottles   Culture   Final    NO GROWTH 2 DAYS Performed at [facility], [address] [place], [place] [address]    Report Status PENDING  Incomplete  Blood culture (routine x 2)     Status: None (Preliminary result)   Collection Time: 05/12/21  5:54 PM   Specimen: BLOOD  Result Value Ref Range Status   Specimen Description BLOOD LEFT ANTECUBITAL  Final   Special Requests   Final    BOTTLES DRAWN AEROBIC AND ANAEROBIC Blood Culture results may not be optimal due to an excessive volume of blood received in culture bottles   Culture   Final    NO GROWTH 2 DAYS Performed at [facility], [address] [place], [place] [address]    Report Status PENDING  Incomplete  Resp Panel by RT-PCR (Flu A&B, Covid) Nasopharyngeal Swab     Status: None   Collection Time: 05/12/21  5:54 PM   Specimen: Nasopharyngeal Swab; Nasopharyngeal(NP) swabs in vial transport medium  Result Value Ref Range Status   SARS Coronavirus 2 by RT PCR NEGATIVE NEGATIVE Final    Comment: (NOTE) SARS-CoV-2 target nucleic acids are NOT DETECTED.  The SARS-CoV-2 RNA is generally detectable in upper respiratory specimens during the acute phase of infection. The lowest concentration of SARS-CoV-2 viral copies this assay can detect is 138 copies/mL. A negative result does not preclude SARS-Cov-2 infection and  should not be used as the sole basis for treatment or other patient management decisions.  A negative result may occur with  improper specimen collection/handling, submission of specimen other than nasopharyngeal swab, presence of viral mutation(s) within the areas targeted by this assay, and inadequate number of viral copies(<138 copies/mL). A negative result must be combined with clinical observations, patient history, and epidemiological information. The expected result is Negative.  Fact Sheet for Patients:  EntrepreneurPulse.com.au  Fact Sheet for Healthcare Providers:  IncredibleEmployment.be  This test is no t yet approved or cleared by the [place] FDA and  has been authorized for detection and/or diagnosis of SARS-CoV-2 by FDA under an Emergency Use Authorization (EUA). This EUA will remain  in effect (meaning this test can be used) for the duration of the COVID-19 declaration under Section 564(b)(1) of the Act, 21 U.S.C.section 360bbb-3(b)(1), unless the authorization is terminated  or revoked sooner.       Influenza A by PCR NEGATIVE NEGATIVE Final   Influenza B by PCR NEGATIVE NEGATIVE Final    Comment: (NOTE) The Xpert Xpress SARS-CoV-2/FLU/RSV plus assay is intended as an aid in the diagnosis of influenza from Nasopharyngeal swab specimens and should not be used as a sole basis for treatment. Nasal washings and aspirates are unacceptable for Xpert Xpress SARS-CoV-2/FLU/RSV testing.  Fact Sheet for Patients: EntrepreneurPulse.com.au  Fact Sheet for Healthcare Providers: IncredibleEmployment.be  This test is not yet approved or cleared by the [place] FDA and has been authorized for detection and/or diagnosis of SARS-CoV-2 by FDA under an Emergency Use Authorization (EUA). This EUA will remain in effect (meaning this test can be used) for the duration of the COVID-19 declaration  under Section 564(b)(1) of the Act, 21 U.S.C. section 360bbb-3(b)(1), unless the authorization is terminated or revoked.  Performed at [facility], [address] [place], [place] [address]    Time coordinating discharge: 35 minutes  SIGNED:  Kerney Elbe, DO [facility] 05/14/2021, 7:10 PM Pager is on [place]  If 7PM-7AM, please contact night-coverage [email or web address]

## 2021-05-14 NOTE — TOC Transition Note (Signed)
Transition of Care [facility]) - CM/SW Discharge Note   Patient Details  Name: Gabriela [name] MRN: [id number] Date of Birth: Oct 13, 1931  [facility] of [facility] CM/SW Contact:  Gabriela Hutching, RN Phone Number: 05/14/2021, 2:39 PM   Clinical Narrative:    Patient placed under observation for weakness, hypotension, and UTI.  Patient is medically cleared to be discharged home today.  RNCM met with patient at the bedside, her son is also present.  Patient is from home where she has been walking with a walker.  She was recently discharged from [facility] after she had been there for therapy for 2 weeks.  Patient reports that she does not want to go back to rehab and will be going home with her daughter, Gabriela [name].  Patient is open with [facility] for RN, PT, and OT, Gabriela [name] aware of Observation stay and discharge planned for today.  Patient has a walker, 3 in 1, wheelchair and shower chair.  Patient's son will transport her home today.     Final next level of care: [place] Barriers to Discharge: Barriers Resolved   Patient Goals and CMS Choice Patient states their goals for this hospitalization and ongoing recovery are:: Patient wants to go home and will be staying with her daughter CMS Medicare.gov Compare Post Acute Care list provided to:: Patient Choice offered to / list presented to : Patient  Discharge Placement                       Discharge Plan and Services   Discharge Planning Services: CM Consult Post Acute Care Choice: Home Health          DME Arranged: N/A DME Agency: NA       HH Arranged: RN, PT, OT HH Agency: [place] Date [place]: 05/14/21 Time [place]: 1017 Representative spoke with at [place]: Gabriela [name]  Social Determinants of Health ([place]) Interventions     Readmission Risk Interventions No flowsheet data found.

## 2021-05-14 NOTE — Care Management Obs Status (Signed)
[place] NOTIFICATION   Patient Details  Name: LUZELENA HEEG MRN: [id number] Date of Birth: 1931-12-28   Medicare Observation Status Notification Given:  Yes    Shelbie Hutching, RN 05/14/2021, 12:59 PM

## 2021-05-14 NOTE — Progress Notes (Signed)
PT Cancellation Note  Patient Details Name: Gabriela [name] MRN: [id number] DOB: 09/14/1931   Cancelled Treatment:    Reason Eval/Treat Not Completed: Medical issues which prohibited therapy  Order received and chart reviewed. Pt with INR of 6.7 which is contraindicated for therapeutic intervention. Will re-attempt when pt more medically appropriate.   Andrey Campanile, SPT   Andrey Campanile 05/14/2021, 8:42 AM

## 2021-05-14 NOTE — Evaluation (Signed)
Physical Therapy Evaluation Patient Details Name: Gabriela [name] MRN: [id number] DOB: January 18, 1932 Today's Date: 05/14/2021  History of Present Illness  Gabriela [name] is a 87 y.o. female with medical history significant for atrial fibrillation on warfarin, GERD, history of bladder cancer (about 15 years ago) and breast cancer (many years ago), recent diagnosis of colon cancer with ostomy (surgery 04/06/21), hypothyroidism, chronic anemia, who presents to the emergency department on 05/12/2021 with generalized weakness and change in urine. They noticed a low blood pressure at home and high heart rate; SBP was 60/38 on 05/11/21 and still low by home health on the day of admission. She has felt very weak since surgery on 04/06/21 but worse in the week prior to admission. Pt spent approximately two weeks starting 9/28 at [facility] and reports she was improving her ambulation and discharged to daughter's home with 24/7 supervision.   Clinical Impression  Pt is a pleasant 87 year old female who presents to PT evaluation after being referred from ED after being seen by [facility] RN with concerns of low blood pressure. Pt's current INR reading was a 6.7 however, MD requesting evaluation and giving permission for OOB mobility assessment via [place].  Pt previously discharged from [facility] on 9/28 to SNF and pt reports she was able to ambulate 100 ft wit RW. After ~2 weeks at SNF, pt returned home with her daughter who provides 24/7 supervision and pt was mostly independent in the home with usage of RW. Upon evaluation today, pt demonstrating ability to complete bed mobility independently, transfer with mod I, and ambulate 30 ft with RW + SPV. Pt reports that she plans to discharge back to daughters home and recommending continuing HHPT to improve patient's safety and endurance with functional mobility. Will continue to see patient during acute hospitalization to address endurance and mobility deficits.      Recommendations for follow up therapy are one component of a multi-disciplinary discharge planning process, led by the attending physician.  Recommendations may be updated based on patient status, additional functional criteria and insurance authorization.  Follow Up Recommendations Home health PT;Supervision - Intermittent    Equipment Recommendations  None recommended by PT    Recommendations for Other Services       Precautions / Restrictions Precautions Precautions: Fall Restrictions Weight Bearing Restrictions: No      Mobility  Bed Mobility Overal bed mobility: Independent Bed Mobility: Supine to Sit       Sit to supine: Independent   General bed mobility comments: No usage of bed rails; able to complete independently    Transfers Overall transfer level: Modified independent Equipment used: Rolling walker (2 wheeled) Transfers: Sit to/from Stand Sit to Stand: Modified independent (Device/Increase time)         General transfer comment: Able to stand without assistance with usage of RW for balance  Ambulation/Gait Ambulation/Gait assistance: Supervision Gait Distance (Feet): 30 Feet Assistive device: Rolling walker (2 wheeled) Gait Pattern/deviations: Step-through pattern;WFL(Within Functional Limits) Gait velocity: decreased   General Gait Details: Pt able to ambulate with B UE support on RW without any balance deficits witnessed  Science writer    Modified Rankin (Stroke Patients Only)       Balance Overall balance assessment: Needs assistance Sitting-balance support: No upper extremity supported;Feet supported Sitting balance-Leahy Scale: Normal     Standing balance support: Bilateral upper extremity supported;During functional activity Standing balance-Leahy Scale: [place] Standing  balance comment: SPV for ambulation in her room with B UE support for balance                             Pertinent  Vitals/Pain Pain Assessment: No/denies pain    Home Living Family/patient expects to be discharged to:: Private residence Living Arrangements: Children Available Help at Discharge: Family;Available 24 hours/day Type of Home: House Home Access: Stairs to enter Entrance Stairs-Rails: Can reach both Entrance Stairs-Number of Steps: 3     Additional Comments: Pt reports she discharged from [facility] about a week ago and that she went to live at her daughters house who provides 24/7 assist/SPV    Prior Function Level of Independence: Independent with assistive device(s)         Comments: Ambulating prior to admission with RW     Hand Dominance        Extremity/Trunk Assessment   Upper Extremity Assessment Upper Extremity Assessment: Overall WFL for tasks assessed    Lower Extremity Assessment Lower Extremity Assessment: Overall WFL for tasks assessed;Generalized weakness       Communication   Communication: HOH  Cognition Arousal/Alertness: Awake/alert Behavior During Therapy: WFL for tasks assessed/performed Overall Cognitive Status: Within Functional Limits for tasks assessed                                 General Comments: A &O x 4. Pleasant and agreeable throughout      General Comments      Exercises     Assessment/Plan    PT Assessment Patient needs continued PT services  PT Problem List         PT Treatment Interventions DME instruction;Gait training;Stair training;Functional mobility training;Therapeutic activities;Therapeutic exercise;Balance training;Neuromuscular re-education;Patient/family education;Manual techniques;Modalities    PT Goals (Current goals can be found in the Care Plan section)  Acute Rehab PT Goals Patient Stated Goal: to improve her mobility and endurance PT Goal Formulation: With patient Time For Goal Achievement: 05/28/21 Potential to Achieve Goals: Good    Frequency Min 2X/week   Barriers to discharge         Co-evaluation               AM-PAC PT "6 Clicks" Mobility  Outcome Measure Help needed turning from your back to your side while in a flat bed without using bedrails?: None Help needed moving from lying on your back to sitting on the side of a flat bed without using bedrails?: None Help needed moving to and from a bed to a chair (including a wheelchair)?: A Little Help needed standing up from a chair using your arms (e.g., wheelchair or bedside chair)?: A Little Help needed to walk in hospital room?: A Little Help needed climbing 3-5 steps with a railing? : A Little 6 Click Score: 20    End of Session Equipment Utilized During Treatment: Gait belt Activity Tolerance: Patient tolerated treatment well;Patient limited by fatigue Patient left: in chair;with call bell/phone within reach;with chair alarm set;with family/visitor present Nurse Communication: Mobility status PT Visit Diagnosis: Unsteadiness on feet (R26.81);Muscle weakness (generalized) (M62.81)    Time: 4970-2637 PT Time Calculation (min) (ACUTE ONLY): 13 min   Charges:   PT Evaluation $PT Eval Low Complexity: [place], SPT   Andrey Campanile 05/14/2021, 2:57 PM

## 2021-05-14 NOTE — Progress Notes (Signed)
OT Cancellation Note  Patient Details Name: Gabriela [name] MRN: [id number] DOB: Feb 23, 1932   Cancelled Treatment:    Reason Eval/Treat Not Completed: Medical issues which prohibited therapy. Chart reviewed. Pt with INR of 6.7 which is contraindicated for therapeutic intervention. OT to re-attempt when pt is able to safely participate.   Ardeth Perfect., MPH, MS, OTR/L ascom [phone] 05/14/21, 8:07 AM

## 2021-05-16 LAB — URINE CULTURE: Culture: <10000 — AB

## 2021-05-17 LAB — CULTURE, BLOOD (ROUTINE X 2)
Culture: NO GROWTH
Culture: NO GROWTH

## 2021-06-01 ENCOUNTER — Telehealth: Payer: Self-pay | Admitting: *Deleted

## 2021-06-01 NOTE — Telephone Encounter (Signed)
Patients daughter [name] called in regards with a concern about her mother. The patient had a ileostomy revision by Dr Christian Mate on 04/15/21 and since then she has anal discharge with a very smelly odor. She stated its putty in color. She wants to know is this normal since is been going on for so long now. Please call daughter at [phone]

## 2021-06-01 NOTE — Telephone Encounter (Signed)
Home health came and saw the patient today and felt like the patient needed to be seen.

## 2021-06-02 ENCOUNTER — Ambulatory Visit (INDEPENDENT_AMBULATORY_CARE_PROVIDER_SITE_OTHER): Payer: Medicare PPO | Admitting: Surgery

## 2021-06-02 ENCOUNTER — Encounter: Payer: Self-pay | Admitting: Surgery

## 2021-06-02 ENCOUNTER — Other Ambulatory Visit: Payer: Self-pay

## 2021-06-02 VITALS — BP 165/52 | HR 72 | Temp 98.2°F | Ht 63.0 in | Wt 135.2 lb

## 2021-06-02 DIAGNOSIS — Z432 Encounter for attention to ileostomy: Secondary | ICD-10-CM

## 2021-06-02 NOTE — Progress Notes (Signed)
Gabriela [name] today presents on the advice of her home health care because of some right-sided/right lower quadrant abdominal pain for 3 to 4 days.  Today she seems to have this somewhat resolved upon changes in positioning and during my examination.  There is no evidence of any peritoneal findings on her abdominal exam she is soft and nontender throughout her abdominal region.  I suspect this may have been musculoskeletal in etiology.  We again reviewed the fact that she has plenty of rectum and will likely have some mucousy discharge and if combined with her fecal incontinence she will need to continue to wear some form of pad. At present she continues to have good ileostomy function and a secure fitting appliance.  She has no evidence of infection or abdominal distress or discomfort at present time and reassurances were given to both she and her daughter.

## 2021-06-02 NOTE — Patient Instructions (Signed)
If you have any concerns or questions, please feel free to call our office.   Ileostomy Home Guide An ileostomy is a surgical procedure to make an opening (stoma) for stool to leave your body. The surgery is done when a medical condition prevents stool from passing through the intestines and leaving your body through the rectum. During the surgery, a part of the small intestine (ileum) is attached to the stoma made in the abdominal wall. A bag or pouch is fitted over the stoma. Stool and gas will collect in the pouch. After having this surgery, you will need to empty and change your ileostomy pouch as needed. You will also need to take steps to care for the stoma. What are the risks? Risks include: Skin irritation around the stoma. Leaks in the pouching system. Narrowing (constriction) of the stoma if the barrier is cut smaller than the stoma. Supplies needed: One- or two-piece pouching system. Curved scissors to cut the ostomy barrier to fit your stoma, if needed. Ostomy belt, if needed. Any other items your health care provider has recommended, such as ostomy powder, paste, or skin barrier film. Soft washcloth or soft paper towel. How do I care for my stoma? Your stoma should look pink and moist. At first, the stoma may be swollen, but this swelling will go away within 6 weeks. To care for the stoma: Keep the skin around the stoma clean and dry. Use a clean, soft washcloth or soft paper towel to gently wash the stoma and the skin around it with warm water. Use stoma powder or paste on your skin only as told by your health care provider. If your skin becomes irritated, change your ileostomy pouch. The irritation may indicate that the pouch is leaking. Measure the stoma opening regularly and record the size. Watch for changes. Share the information with your health care provider. Check your stoma area every day for signs of infection. Check for: More redness, swelling, or pain. More fluid  or blood. Pus or warmth. How do I care for my ileostomy pouch? The pouch that fits over the stoma can have either one or two pieces. One-piece pouch: The skin barrier and the pouch are combined in one unit. Two-piece pouch: The skin barrier and the pouch are separate pieces that attach to each other. Empty your pouch when it is one-third to one-half full. Do not let more stool or gas build up. This could cause the pouch to leak. Some pouches have a built-in gas release valve. Change your pouch every 3-4 days for the first 6 weeks, and then every 5-7 days after that. You should also change the pouch right away if the skin near the stoma looks irritated. How do I empty my ileostomy pouch? You will be taught how to empty your pouch before you leave the hospital. You may be told to: Wash your hands with soap and water. If soap and water are not available, use hand sanitizer. Sit far back on the toilet. Put pieces of toilet paper into the toilet water. This will prevent splashing as you empty the stool into the toilet bowl. Unclip the tail end of the pouch or open it with the non-removable system that stays attached at the end of the pouch. Unroll the tail of the pouch and empty the stool into the toilet. Use toilet paper to clean the end of the pouch where the stool drained out. Reroll the tail, and attach the clip or fastener that attaches the pieces  together. Wash your hands again. How do I change my ileostomy pouch? You will be taught how to change the pouch before you leave the hospital. You may be told to: [place] out your supplies. Wash your hands with soap and water. If soap and water are not available, use hand sanitizer. Carefully remove the old pouch. Wash the stoma and the skin around the stoma. Allow the area to dry. Men may be told to carefully shave any hair around the stoma. Use the stoma measuring guide that comes with your pouch to decide what size hole you will need to cut in the  skin barrier piece. Choose the smallest possible size that will go around the stoma but will not touch it. Use the guide to trace the circle on the back of the skin barrier piece. Cut out the hole. Hold the skin barrier piece over the stoma to make sure the hole is the correct size. Remove the adhesive paper backing from the skin barrier piece. If instructed by your health care provider, squeeze stoma paste around the opening of the skin barrier piece. Clean and dry the skin around the stoma again. Carefully fit the skin barrier piece over your stoma. If you are using a two-piece pouch, snap the pouch onto the skin barrier piece. Close the tail of the pouch. Put your hand over the top of the skin barrier piece to help warm it for about 5 minutes. This helps it conform to your body better. Wash your hands again. What are some general tips? Avoid wearing tight clothing over your stoma. You can shower with or without the pouch in place. Do not use harsh or oily soaps or lotions. Always keep the pouch on if you are taking a bath or swimming. If your pouch gets wet, you can dry it with a hair dryer on the cool setting. Whenever you leave home, take extra clothing and ostomy supplies with you, including a skin barrier and pouch. Store all supplies in a cool, dry place. Do not leave supplies in extreme heat as this can melt the parts. Return to your normal activities as told by your health care provider. Ask your health care provider what activities are safe for you. Where to find more information [place]: www.ostomy.org Contact a health care provider if: You have more redness, swelling, or pain around your stoma. You have more fluid or blood coming from your stoma. Your stoma feels warm to the touch. You have pus coming from your stoma. Your stoma extends in or out farther than normal. Your stoma becomes purple, black, or pale white. You need to change the pouch  every day. You have a fever. You have abdominal pain, nausea, or bloating. No stool is passing from the stoma. You have diarrhea, requiring you to empty the pouch more frequently than normal. Get help right away if: Your stool is bloody. You vomit. You have trouble breathing. You feel dizzy or light-headed. Summary You will need to empty and change your ileostomy pouch as told by your health care provider. You will need to take steps to care for the stoma. Contact a health care provider if you need to change the pouch every day, have abdominal pain, nausea, or bloating, or if you have diarrhea, requiring you to empty the pouch more frequently than normal. Get help right away if your stool is bloody. This information is not intended to replace advice given to you by your health care provider. Make  sure you discuss any questions you have with your health care provider. Document Revised: 01/14/2021 Document Reviewed: 01/14/2021 Elsevier Patient Education  [place].

## 2021-06-09 ENCOUNTER — Ambulatory Visit (HOSPITAL_COMMUNITY): Payer: Medicare PPO

## 2021-06-21 ENCOUNTER — Emergency Department (HOSPITAL_BASED_OUTPATIENT_CLINIC_OR_DEPARTMENT_OTHER): Payer: Medicare PPO | Admitting: Radiology

## 2021-06-21 ENCOUNTER — Emergency Department (HOSPITAL_BASED_OUTPATIENT_CLINIC_OR_DEPARTMENT_OTHER): Payer: Medicare PPO

## 2021-06-21 ENCOUNTER — Emergency Department (HOSPITAL_BASED_OUTPATIENT_CLINIC_OR_DEPARTMENT_OTHER)
Admission: EM | Admit: 2021-06-21 | Discharge: 2021-06-21 | Disposition: A | Discharge status: Home / Self Care | Payer: Medicare PPO | Attending: Emergency Medicine | Admitting: Emergency Medicine

## 2021-06-21 ENCOUNTER — Encounter (HOSPITAL_BASED_OUTPATIENT_CLINIC_OR_DEPARTMENT_OTHER): Payer: Self-pay

## 2021-06-21 ENCOUNTER — Other Ambulatory Visit: Payer: Self-pay

## 2021-06-21 DIAGNOSIS — I10 Essential (primary) hypertension: Secondary | ICD-10-CM | POA: Insufficient documentation

## 2021-06-21 DIAGNOSIS — Z7902 Long term (current) use of antithrombotics/antiplatelets: Secondary | ICD-10-CM | POA: Diagnosis not present

## 2021-06-21 DIAGNOSIS — R42 Dizziness and giddiness: Secondary | ICD-10-CM | POA: Insufficient documentation

## 2021-06-21 DIAGNOSIS — S300XXA Contusion of lower back and pelvis, initial encounter: Secondary | ICD-10-CM | POA: Insufficient documentation

## 2021-06-21 DIAGNOSIS — E039 Hypothyroidism, unspecified: Secondary | ICD-10-CM | POA: Diagnosis not present

## 2021-06-21 DIAGNOSIS — Z853 Personal history of malignant neoplasm of breast: Secondary | ICD-10-CM | POA: Insufficient documentation

## 2021-06-21 DIAGNOSIS — Z96653 Presence of artificial knee joint, bilateral: Secondary | ICD-10-CM | POA: Insufficient documentation

## 2021-06-21 DIAGNOSIS — Z79899 Other long term (current) drug therapy: Secondary | ICD-10-CM | POA: Insufficient documentation

## 2021-06-21 DIAGNOSIS — S3992XA Unspecified injury of lower back, initial encounter: Secondary | ICD-10-CM | POA: Diagnosis present

## 2021-06-21 DIAGNOSIS — Z8551 Personal history of malignant neoplasm of bladder: Secondary | ICD-10-CM | POA: Insufficient documentation

## 2021-06-21 DIAGNOSIS — W19XXXA Unspecified fall, initial encounter: Secondary | ICD-10-CM | POA: Insufficient documentation

## 2021-06-21 DIAGNOSIS — Z7951 Long term (current) use of inhaled steroids: Secondary | ICD-10-CM | POA: Insufficient documentation

## 2021-06-21 DIAGNOSIS — Z9011 Acquired absence of right breast and nipple: Secondary | ICD-10-CM | POA: Insufficient documentation

## 2021-06-21 DIAGNOSIS — I482 Chronic atrial fibrillation, unspecified: Secondary | ICD-10-CM | POA: Diagnosis not present

## 2021-06-21 NOTE — ED Provider Notes (Signed)
[facility] EMERGENCY DEPT Provider Note   CSN: [id number] Arrival date & time: 06/21/21  6384     History Chief Complaint  Patient presents with   Fall   Dizziness    Gabriela [name] is a 87 y.o. female.  Patient is an 87 year old female with a history of atrial fibrillation on Coumadin, recent 3-week hospital stay status post abdominal surgery from a large bowel obstruction and ostomy placement who is now staying with a family member presenting today after a fall.  She reports she got up to go get something and she felt a little swimmy headed.  She was not using her cane or her walker and she lost her balance falling backwards hitting her head on the corner of the walker and the floor and her but landing on the carpet.  She had no loss of consciousness.  She denies any headache, nausea vomiting, vision changes.  She denies any chest pain, shortness of breath or feeling dizzy at this time.  She reports she has had normal output from her ostomy she has been eating and drinking well and has been doing well since discharge.  She reports that she will occasionally feel swimmy headed when she stands up quickly but it will resolve.  She is also having pain over her coccyx since the fall.  During her hospitalization she also had soreness and pain there and it had just recently improved until the fall today.  She denies any pain radiating down her legs or trouble with walking.  She was able to get up and stand after the fall.  She denies any arm pain or neck pain.  Last INR was checked 1 week ago and it was 2.4.  The history is provided by the patient.  Fall  Dizziness     Past Medical History:  Diagnosis Date   Acid reflux    Anemia    Anxiety    Atrial fibrillation (HCC)    Bladder cancer ([place])    Breast cancer ([place])    Cancer ([place])    breast,bladder   Depression    Dysrhythmia    afib   GERD (gastroesophageal reflux disease)    History of hiatal hernia    HOH (hard  of hearing)    aids   Hypertension    Hyperthyroidism    Hypothyroidism    Stroke ([place])    2010   TIA (transient ischemic attack)     Patient Active Problem List   Diagnosis Date Noted   Malnutrition of moderate degree 05/13/2021   Acute cystitis with hematuria 05/12/2021   Hyponatremia 05/12/2021   AKI (acute kidney injury) ([place]) 05/12/2021   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   Dehydration 05/12/2021   Supratherapeutic INR 05/12/2021   Large bowel obstruction ([place]) 04/08/2021   Abdominal pain 04/01/2021   Hypertension    Hypothyroidism    Stroke ([place])    Hypokalemia    Depression with anxiety    Atrial fibrillation, chronic (HCC)    Normocytic anemia    Sacroiliitis ([place]) 07/12/2016   Pain in right hip 07/12/2016    Past Surgical History:  Procedure Laterality Date   ABDOMINAL HYSTERECTOMY     ANKLE ARTHODESIS W/ ARTHROSCOPY     BLADDER SURGERY     CATARACT EXTRACTION W/PHACO Left 12/02/2014   Procedure: CATARACT EXTRACTION PHACO AND INTRAOCULAR LENS PLACEMENT ([place]);  Surgeon: Estill Cotta, MD;  Location: [facility] ORS;  Service: Ophthalmology;  Laterality: Left;  US01:45 AP%25.7 CDE42.99  CHOLECYSTECTOMY     EYE SURGERY     cataract   ILEOSTOMY CLOSURE N/A 04/15/2021   Procedure: ILEOSTOMY TAKEDOWN REVISION;  Surgeon: Ronny Bacon, MD;  Location: [facility] ORS;  Service: General;  Laterality: N/A;  not a takedown, just a revision...   INSERTION OF SUPRAPUBIC CATHETER  04/06/2021   Procedure: INSERTION OF FOLEY CATHETER;  Surgeon: Billey Co, MD;  Location: [facility] ORS;  Service: Urology;;   JOINT REPLACEMENT     bil tkr   MASTECTOMY     partial right   REPLACEMENT TOTAL KNEE       OB History   No obstetric history on file.     Family History  Adopted: Yes    Social History   Tobacco Use   Smoking status: Never   Smokeless tobacco: Never  Substance Use Topics   Alcohol use: No   Drug use: Never    Home Medications Prior to Admission  medications   Medication Sig Start Date End Date Taking? Authorizing Provider  acetaminophen (TYLENOL) 325 MG tablet Take 2 tablets (650 mg total) by mouth every 6 (six) hours as needed for mild pain or fever. 04/22/21   Danford, Suann Larry, MD  albuterol (VENTOLIN HFA) 108 (90 Base) MCG/ACT inhaler 2 puffs q.i.d. p.r.n. short of breath, wheezing, or cough 01/19/20   [provider]  ALPRAZolam Duanne Moron) 0.25 MG tablet Take 0.5 tablets (0.125 mg total) by mouth at bedtime as needed for anxiety. 04/22/21   Danford, Suann Larry, MD  feeding supplement (ENSURE ENLIVE / ENSURE PLUS) LIQD Take 237 mLs by mouth 3 (three) times daily between meals. 04/22/21   Danford, Suann Larry, MD  iron polysaccharides (NIFEREX) 150 MG capsule Take 1 capsule (150 mg total) by mouth daily. 05/15/21   Raiford Noble Latif, DO  levothyroxine (SYNTHROID, LEVOTHROID) 112 MCG tablet Take 112 mcg by mouth daily before breakfast.    [provider]  Multiple Vitamin (MULTIVITAMIN WITH MINERALS) TABS tablet Take 1 tablet by mouth daily. 05/15/21   Raiford Noble Latif, DO  nystatin (MYCOSTATIN) 100000 UNIT/ML suspension Take 5 mLs by mouth 4 (four) times daily.    [provider]  ondansetron (ZOFRAN) 4 MG tablet Take 1 tablet (4 mg total) by mouth every 6 (six) hours as needed for nausea or vomiting (FIRST option for nausea/vomiting if can tolerate oral medication.). 05/14/21   Raiford Noble Latif, DO  pantoprazole (PROTONIX) 40 MG tablet Take 40 mg by mouth 2 (two) times daily.    [provider]  sertraline (ZOLOFT) 50 MG tablet Take 50 mg by mouth daily. hs    [provider]  traMADol (ULTRAM) 50 MG tablet Take 1 tablet (50 mg total) by mouth every 6 (six) hours as needed for moderate pain. 04/22/21   Danford, Suann Larry, MD  warfarin (COUMADIN) 5 MG tablet Take 1 tablet (5 mg total) by mouth daily. HOLD WARFARIN UNTIL INR Improves and Restarted at the Direction of Cardiology  05/21/21   Kerney Elbe, DO    Allergies    Celebrex [celecoxib], Morphine and related, Povidone-iodine, and Sulfa antibiotics  Review of Systems   Review of Systems  Neurological:  Positive for dizziness.  All other systems reviewed and are negative.  Physical Exam Updated Vital Signs BP (!) 185/83   Pulse (!) 102   Temp 98.2 F (36.8 C)   Resp 16   LMP  (LMP Unknown)   SpO2 100%   Physical Exam Vitals and nursing note  reviewed.  Constitutional:      General: She is not in acute distress.    Appearance: Normal appearance. She is well-developed.  HENT:     Head: Normocephalic and atraumatic.     Comments: No notable trauma or hematomas to the scalp    Mouth/Throat:     Mouth: Mucous membranes are moist.  Eyes:     Pupils: Pupils are equal, round, and reactive to light.  Cardiovascular:     Rate and Rhythm: Normal rate. Rhythm irregularly irregular.     Heart sounds: Normal heart sounds. No murmur heard.   No friction rub.  Pulmonary:     Effort: Pulmonary effort is normal.     Breath sounds: Normal breath sounds. No wheezing or rales.  Abdominal:     General: Bowel sounds are normal. There is no distension.     Palpations: Abdomen is soft.     Tenderness: There is no abdominal tenderness. There is no guarding or rebound.     Comments: Ostomy present in the right lower quadrant  Musculoskeletal:        General: Tenderness present. Normal range of motion.     Cervical back: Normal range of motion and neck supple. No tenderness.     Comments: No edema.  Tenderness noted over the coccyx with palpation.  No wounds, hematomas or ecchymosis present  Skin:    General: Skin is warm and dry.     Capillary Refill: Capillary refill takes less than 2 seconds.     Findings: No rash.  Neurological:     Mental Status: She is alert and oriented to person, place, and time. Mental status is at baseline.     Cranial Nerves: No cranial nerve deficit.  Psychiatric:         Mood and Affect: Mood normal.        Behavior: Behavior normal.    ED Results / Procedures / Treatments   Labs (all labs ordered are listed, but only abnormal results are displayed) Labs Reviewed - No data to display  EKG EKG Interpretation  Date/Time:  Sunday June 21 2021 16:13:03 EST Ventricular Rate:  93 PR Interval:    QRS Duration: 80 QT Interval:  346 QTC Calculation: 430 R Axis:   96 Text Interpretation: Atrial fibrillation Rightward axis Low voltage QRS Cannot rule out Anteroseptal infarct , age undetermined No significant change since last tracing Confirmed by Blanchie Dessert [id number]) on 06/21/2021 4:28:44 PM  Radiology DG Sacrum/Coccyx  Result Date: 06/21/2021 CLINICAL DATA:  Became dizzy getting up from chair and fell EXAM: SACRUM AND COCCYX - 2+ VIEW COMPARISON:  CT abdomen and pelvis 04/09/2021 FINDINGS: Osseous demineralization. Hip joints preserved. SI joints symmetric. Questionable nondisplaced fracture of the distal sacrum on lateral view, suboptimally seen due to rotation. No additional osseous abnormalities identified. IMPRESSION: Questionable nondisplaced fracture of the distal sacrum on rotated lateral view. Electronically Signed   By: Lavonia Dana M.D.   On: 06/21/2021 16:53   CT Head Wo Contrast  Result Date: 06/21/2021 CLINICAL DATA:  Stood up from chair, became dizzy, and fell, pain at back of head EXAM: CT HEAD WITHOUT CONTRAST TECHNIQUE: Contiguous axial images were obtained from the base of the skull through the vertex without intravenous contrast. COMPARISON:  08/25/2020 FINDINGS: Brain: Generalized atrophy. Normal ventricular morphology. No midline shift or mass effect. Old infarct LEFT frontal lobe and insula. Extensive small vessel chronic ischemic changes of deep cerebral white matter. No intracranial hemorrhage, mass lesion or evidence  of acute infarction. No extra-axial fluid collections. Vascular: No hyperdense vessels. Skull: Mild osseous  demineralization. Calvaria hemangioma RIGHT parietal unchanged. Sinuses/Orbits: Clear Other: N/A IMPRESSION: Atrophy with small vessel chronic ischemic changes of deep cerebral white matter. Small old infarct in LEFT MCA territory involving LEFT frontal lobe and insula. No acute intracranial abnormalities. Electronically Signed   By: Lavonia Dana M.D.   On: 06/21/2021 16:59    Procedures Procedures   Medications Ordered in ED Medications - No data to display  ED Course  I have reviewed the triage vital signs and the nursing notes.  Pertinent labs & imaging results that were available during my care of the patient were reviewed by me and considered in my medical decision making (see chart for details).    MDM Rules/Calculators/A&P                           Patient presenting after a fall when she stood up quickly and felt a bit dizzy.  Her symptoms resolved quickly but she had already lost her balance.  She is on Coumadin and did fall and hit her head.  She otherwise reports she is feeling fine here.  She was able to stand and walk here with her cane without assistance.  Low suspicion for stroke at this time.  Patient did recently come out of the hospital after a 3-week stay surgery and ostomy placement.  She denies any new change in her output and has been eating and drinking.  She does not appear dehydrated.  After sitting down heart rate improved to less than 100.  She is in atrial fibrillation but this is chronic.  Will CT head to ensure no bleed.  INR was checked last week and was therapeutic.  We will also get plain films of the coccyx.  Low suspicion for an acute cardiac event at this time.  5:33 PM Imaging of the brain is negative for acute intracranial bleed.  X-ray shows possible sacral fracture that is nondisplaced however patient is ambulatory and reports it does not feel that bad.  Cautioned using a donut to sit on or a good cushion.  Patient was given return precautions.  MDM    Amount and/or Complexity of Data Reviewed Tests in the radiology section of CPT: ordered and reviewed Independent visualization of images, tracings, or specimens: yes    Final Clinical Impression(s) / ED Diagnoses Final diagnoses:  Fall, initial encounter  Coccyx contusion, initial encounter    Rx / DC Orders ED Discharge Orders     None        Blanchie Dessert, MD 06/21/21 1734

## 2021-06-21 NOTE — Discharge Instructions (Signed)
The CAT scan of your brain is normal.  No signs of bleeding.  However if over the next week or 2 you start developing vomiting, dizziness, confusion, difficulty walking you should return to the emergency room for repeat imaging.  Make sure you use a donut are soft pillow to sit on as you may have cracked her tailbone.

## 2021-06-21 NOTE — ED Triage Notes (Signed)
She tells me that upon getting up out of a chair she felt "dizzy", "didn't have my cane, and I fell". She states she did not pass out, and tghat she fell backward. She c/o pain at back of head and coccyx areas. She is ambulatory at her baseline now with her cane. She denies any neck pain and her neck is supple.

## 2021-06-22 NOTE — Telephone Encounter (Signed)
[place] is calling with Dr. Domenic Moras office they are needing [place] to send over a referral to there office our patient was seen there on November 15th telephone number is [phone], fax  [phone]. Please call and advise.

## 2021-07-24 ENCOUNTER — Other Ambulatory Visit: Payer: Self-pay

## 2021-07-24 ENCOUNTER — Ambulatory Visit (HOSPITAL_COMMUNITY)
Admission: RE | Admit: 2021-07-24 | Discharge: 2021-07-24 | Disposition: A | Discharge status: Home / Self Care | Payer: Medicare PPO | Source: Ambulatory Visit | Attending: Nurse Practitioner | Admitting: Nurse Practitioner

## 2021-07-24 DIAGNOSIS — Z932 Ileostomy status: Secondary | ICD-10-CM | POA: Diagnosis present

## 2021-07-24 DIAGNOSIS — Z432 Encounter for attention to ileostomy: Secondary | ICD-10-CM

## 2021-07-24 NOTE — Discharge Instructions (Signed)
We are switching to a 1piece convex today. Cut opening 1 inch Email me and let me know how you like the bag Santiago [email or web address] Phone  [phone]

## 2021-07-24 NOTE — Progress Notes (Signed)
[facility]   Reason for visit:  RLQ ileostomy, leaks occurring, usually at night.  HPI:  Colon cancer with ostomy placed 03/2021.  Has been living with daughter. Is now home alone  managing pouch independently now and leaks are stressful.  ROS  Review of Systems  Gastrointestinal:        RLQ ileostomy Parastomal hernia  Skin:  Positive for rash.       Contact dermatitis around stoma from leakage  All other systems reviewed and are negative. Vital signs:  BP (!) 145/74 (BP Location: Right Arm)    Pulse 87    Temp 98 F (36.7 C) (Oral)    Resp (!) 185    LMP  (LMP Unknown)    SpO2 100%  Exam:  Physical Exam Constitutional:      Appearance: She is normal weight.  HENT:     Right Ear: Decreased hearing noted.     Left Ear: Decreased hearing noted.  Abdominal:     General: Abdomen is flat.     Palpations: Abdomen is soft.     Comments: RLQ ileostomy Parastomal hernia  Skin:    Findings: Rash present.          Comments: Irritant contact dermatitis  Neurological:     Mental Status: She is alert.     Motor: Weakness present.  Psychiatric:        Mood and Affect: Mood normal.        Behavior: Behavior normal.    Stoma type/location:  RLQ ileostomy Stomal assessment/size:  1"  pink and moist  slightly budded.  This is smaller than previous per daughter and they have ordered pre sized pouches in larger size  We discuss use of barrier ring and as long as skin is protected this is ok.  Peristomal assessment:  Parastomal hernia present, noted at 32 oclock.  Bulging present.  Discuss that this is fine as long as producing stool, no pain or increase in size.  Contact dermatitis from 2 to 6 oclock.  Partial thickness tissue loss with scant weeping.  Treatment options for stomal/peristomal skin: Will switch to convex pouch.  Needs medium ostomy belt. Will request from [facility] with switch to convex pouch.  Patient has new shipment supplies, but has not opened Output: Soft  brown stool Ostomy pouching: 1pc.convex with powder, skin prep and barrier ring. Needs belt for additional security Education provided:  We perform pouch change and discuss stoma size and procedure for pouch change.     Impression/dx  Contact dermatitis Ileostomy with parastomal hernia Discussion  New pouch applied and samples provided.  Plan  Call [facility].  Request new convex pouches, medium belt and return shipment that just arrived (unopened)     Visit time: 55 minutes.   Domenic Moras FNP-BC

## 2021-07-28 ENCOUNTER — Other Ambulatory Visit (HOSPITAL_COMMUNITY): Payer: Self-pay | Admitting: Nurse Practitioner

## 2021-07-28 DIAGNOSIS — Z432 Encounter for attention to ileostomy: Secondary | ICD-10-CM

## 2021-10-24 ENCOUNTER — Encounter: Payer: Self-pay | Admitting: Emergency Medicine

## 2021-10-24 ENCOUNTER — Emergency Department
Admission: EM | Admit: 2021-10-24 | Discharge: 2021-10-24 | Disposition: A | Discharge status: Home / Self Care | Payer: Medicare PPO | Attending: Emergency Medicine | Admitting: Emergency Medicine

## 2021-10-24 ENCOUNTER — Other Ambulatory Visit: Payer: Self-pay

## 2021-10-24 DIAGNOSIS — Z85038 Personal history of other malignant neoplasm of large intestine: Secondary | ICD-10-CM | POA: Insufficient documentation

## 2021-10-24 DIAGNOSIS — I10 Essential (primary) hypertension: Secondary | ICD-10-CM | POA: Insufficient documentation

## 2021-10-24 DIAGNOSIS — Z433 Encounter for attention to colostomy: Secondary | ICD-10-CM | POA: Diagnosis present

## 2021-10-24 DIAGNOSIS — Z7189 Other specified counseling: Secondary | ICD-10-CM

## 2021-10-24 NOTE — ED Notes (Signed)
New ostomy bag placed with benzocaine to assist in sticking to skin. Patient provided with extra supplies.  ?

## 2021-10-24 NOTE — ED Provider Notes (Signed)
? ?[facility] ?Provider Note ? ? ? Event Date/Time  ? First MD Initiated Contact with Patient 10/24/21 1940   ?  (approximate) ? ? ?History  ? ?Post-op Problem (/) ? ? ?HPI ? ?Gabriela [name] is a 88 y.o. female  who, per PCP note dated 09/24/21 has history of HTN, HLD, adenocarcinoma of the colon and ileostomy, who presents to the emergency department today because of concern for issues with her ileostomy.  She states that she has had watery output over the past roughly 4 days.  She has been using multiple ileostomy bags a day and is now noticed some skin breakdown where she normally adheres the bag.  Because of that she is now having a harder time sticking new bags on.  This causes further leakage around the ostomy.  She denies any associated abdominal pain.  She denies any fevers. ? ?Physical Exam  ? ?Triage Vital Signs: ?ED Triage Vitals  ?Enc Vitals Group  ?   BP 10/24/21 1931 (!) 187/86  ?   Pulse Rate 10/24/21 1931 94  ?   Resp 10/24/21 1931 (!) 22  ?   Temp 10/24/21 1931 98.3 ?F (36.8 ?C)  ?   Temp Source 10/24/21 1931 Oral  ?   SpO2 10/24/21 1931 96 %  ?   Weight 10/24/21 1928 130 lb (59 kg)  ?   Height 10/24/21 1928 '5\' 3"'$  (1.6 m)  ?   Head Circumference --   ?   Peak Flow --   ?   Pain Score 10/24/21 1928 0  ? ?Most recent vital signs: ?Vitals:  ? 10/24/21 1931  ?BP: (!) 187/86  ?Pulse: 94  ?Resp: (!) 22  ?Temp: 98.3 ?F (36.8 ?C)  ?SpO2: 96%  ? ? ?General: Awake, no distress.  ?CV:  Good peripheral perfusion.  ?Resp:  Normal effort.  ?Abd:  No distention. Ostomy in place. Some skin breakdown around the ostomy. Non tender.  ? ? ?ED Results / Procedures / Treatments  ? ?Labs ?(all labs ordered are listed, but only abnormal results are displayed) ?Labs Reviewed - No data to display ? ? ?EKG ? ?None ? ? ?RADIOLOGY ?None ? ? ?PROCEDURES: ? ?Critical Care performed: No ? ?Procedures ? ? ?MEDICATIONS ORDERED IN ED: ?Medications - No data to display ? ? ?IMPRESSION / MDM / ASSESSMENT AND PLAN /  ED COURSE  ?I reviewed the triage vital signs and the nursing notes. ?             ?               ? ?Differential diagnosis includes, but is not limited to, ostomy difficulty. ? ?Patient presented to the emergency department today because of concern for having a difficult time adhering her ostomy bags to her skin.  On exam she does have some skin breakdown around the ostomy.  I do think likely this is causing there to be a difficult time getting that he decision to stick.  Benzoin was used to try to place an ostomy bag here in the emergency department.  She was then observed for a little while without any clear failure of the adhesion.  She did feel comfortable going home.  Will discharge home. ? ? ?FINAL CLINICAL IMPRESSION(S) / ED DIAGNOSES  ? ?Final diagnoses:  ?Encounter for ostomy care education  ? ? ? ?Note:  This document was prepared using Dragon voice recognition software and may include unintentional dictation errors. ? ?  ?  Nance Pear, MD ?10/24/21 2151 ? ?

## 2021-10-24 NOTE — ED Triage Notes (Signed)
Pt to ED via POV, pt states has an Illeostomy. Reports today "it just keeps coming". Pt states has had to change her illeostomy bag approx 5 times today due to stool leaking out and soaking the adhesive. Pt states is supposed to change approx 1 x week. Pt denies any pain at this time. Pt A&O, noted to be HOH in triage.  ?

## 2021-10-24 NOTE — Discharge Instructions (Signed)
Please seek medical attention for any high fevers, chest pain, shortness of breath, change in behavior, persistent vomiting, bloody stool or any other new or concerning symptoms.  

## 2021-10-24 NOTE — ED Notes (Signed)
Discharge instructions provided to patient with follow-up. Patient verbalized understanding. Patient ambulated out to lobby with son.  ?

## 2022-01-29 ENCOUNTER — Encounter (HOSPITAL_COMMUNITY): Payer: Self-pay | Admitting: Nurse Practitioner

## 2022-02-05 ENCOUNTER — Encounter (HOSPITAL_COMMUNITY): Payer: Self-pay | Admitting: Nurse Practitioner

## 2022-02-16 ENCOUNTER — Encounter (HOSPITAL_COMMUNITY): Payer: Self-pay | Admitting: Nurse Practitioner

## 2022-02-17 DIAGNOSIS — E039 Hypothyroidism, unspecified: Secondary | ICD-10-CM | POA: Diagnosis not present

## 2022-02-17 DIAGNOSIS — I482 Chronic atrial fibrillation, unspecified: Secondary | ICD-10-CM | POA: Diagnosis not present

## 2022-02-17 DIAGNOSIS — E538 Deficiency of other specified B group vitamins: Secondary | ICD-10-CM | POA: Diagnosis not present

## 2022-02-17 DIAGNOSIS — Z7901 Long term (current) use of anticoagulants: Secondary | ICD-10-CM | POA: Diagnosis not present

## 2022-02-25 ENCOUNTER — Other Ambulatory Visit: Payer: Self-pay | Admitting: Family Medicine

## 2022-02-25 DIAGNOSIS — E039 Hypothyroidism, unspecified: Secondary | ICD-10-CM

## 2022-03-04 ENCOUNTER — Ambulatory Visit
Admission: RE | Admit: 2022-03-04 | Discharge: 2022-03-04 | Disposition: A | Discharge status: Home / Self Care | Payer: Medicare PPO | Source: Ambulatory Visit | Attending: Family Medicine | Admitting: Family Medicine

## 2022-03-04 DIAGNOSIS — E039 Hypothyroidism, unspecified: Secondary | ICD-10-CM | POA: Diagnosis not present

## 2022-03-22 DIAGNOSIS — Z7901 Long term (current) use of anticoagulants: Secondary | ICD-10-CM | POA: Diagnosis not present

## 2022-03-22 DIAGNOSIS — E538 Deficiency of other specified B group vitamins: Secondary | ICD-10-CM | POA: Diagnosis not present

## 2022-03-22 DIAGNOSIS — I482 Chronic atrial fibrillation, unspecified: Secondary | ICD-10-CM | POA: Diagnosis not present

## 2022-03-31 DIAGNOSIS — Z1389 Encounter for screening for other disorder: Secondary | ICD-10-CM | POA: Diagnosis not present

## 2022-03-31 DIAGNOSIS — J449 Chronic obstructive pulmonary disease, unspecified: Secondary | ICD-10-CM | POA: Diagnosis not present

## 2022-03-31 DIAGNOSIS — C189 Malignant neoplasm of colon, unspecified: Secondary | ICD-10-CM | POA: Diagnosis not present

## 2022-03-31 DIAGNOSIS — F419 Anxiety disorder, unspecified: Secondary | ICD-10-CM | POA: Diagnosis not present

## 2022-03-31 DIAGNOSIS — I482 Chronic atrial fibrillation, unspecified: Secondary | ICD-10-CM | POA: Diagnosis not present

## 2022-03-31 DIAGNOSIS — Z0000 Encounter for general adult medical examination without abnormal findings: Secondary | ICD-10-CM | POA: Diagnosis not present

## 2022-03-31 DIAGNOSIS — D649 Anemia, unspecified: Secondary | ICD-10-CM | POA: Diagnosis not present

## 2022-03-31 DIAGNOSIS — I2729 Other secondary pulmonary hypertension: Secondary | ICD-10-CM | POA: Diagnosis not present

## 2022-03-31 DIAGNOSIS — K219 Gastro-esophageal reflux disease without esophagitis: Secondary | ICD-10-CM | POA: Diagnosis not present

## 2022-03-31 DIAGNOSIS — E782 Mixed hyperlipidemia: Secondary | ICD-10-CM | POA: Diagnosis not present

## 2022-03-31 DIAGNOSIS — I10 Essential (primary) hypertension: Secondary | ICD-10-CM | POA: Diagnosis not present

## 2022-03-31 DIAGNOSIS — E039 Hypothyroidism, unspecified: Secondary | ICD-10-CM | POA: Diagnosis not present

## 2022-04-22 DIAGNOSIS — E538 Deficiency of other specified B group vitamins: Secondary | ICD-10-CM | POA: Diagnosis not present

## 2022-04-22 DIAGNOSIS — Z7901 Long term (current) use of anticoagulants: Secondary | ICD-10-CM | POA: Diagnosis not present

## 2022-04-22 DIAGNOSIS — I482 Chronic atrial fibrillation, unspecified: Secondary | ICD-10-CM | POA: Diagnosis not present

## 2022-04-26 DIAGNOSIS — M7061 Trochanteric bursitis, right hip: Secondary | ICD-10-CM | POA: Diagnosis not present

## 2022-04-26 DIAGNOSIS — M79604 Pain in right leg: Secondary | ICD-10-CM | POA: Diagnosis not present

## 2022-05-24 DIAGNOSIS — E538 Deficiency of other specified B group vitamins: Secondary | ICD-10-CM | POA: Diagnosis not present

## 2022-05-24 DIAGNOSIS — Z7901 Long term (current) use of anticoagulants: Secondary | ICD-10-CM | POA: Diagnosis not present

## 2022-05-28 DIAGNOSIS — Z23 Encounter for immunization: Secondary | ICD-10-CM | POA: Diagnosis not present

## 2022-05-28 DIAGNOSIS — R399 Unspecified symptoms and signs involving the genitourinary system: Secondary | ICD-10-CM | POA: Diagnosis not present

## 2022-06-24 DIAGNOSIS — Z7901 Long term (current) use of anticoagulants: Secondary | ICD-10-CM | POA: Diagnosis not present

## 2022-06-24 DIAGNOSIS — E538 Deficiency of other specified B group vitamins: Secondary | ICD-10-CM | POA: Diagnosis not present

## 2022-07-27 DIAGNOSIS — Z7901 Long term (current) use of anticoagulants: Secondary | ICD-10-CM | POA: Diagnosis not present

## 2022-07-27 DIAGNOSIS — E538 Deficiency of other specified B group vitamins: Secondary | ICD-10-CM | POA: Diagnosis not present

## 2022-08-10 DIAGNOSIS — R791 Abnormal coagulation profile: Secondary | ICD-10-CM | POA: Diagnosis not present

## 2022-08-26 DIAGNOSIS — M7061 Trochanteric bursitis, right hip: Secondary | ICD-10-CM | POA: Diagnosis not present

## 2022-08-26 DIAGNOSIS — Z932 Ileostomy status: Secondary | ICD-10-CM | POA: Diagnosis not present

## 2022-09-07 DIAGNOSIS — Z7901 Long term (current) use of anticoagulants: Secondary | ICD-10-CM | POA: Diagnosis not present

## 2022-09-07 DIAGNOSIS — E538 Deficiency of other specified B group vitamins: Secondary | ICD-10-CM | POA: Diagnosis not present

## 2022-09-20 DIAGNOSIS — Z932 Ileostomy status: Secondary | ICD-10-CM | POA: Diagnosis not present

## 2022-09-20 DIAGNOSIS — Z85038 Personal history of other malignant neoplasm of large intestine: Secondary | ICD-10-CM | POA: Diagnosis not present

## 2022-09-27 DIAGNOSIS — Z85038 Personal history of other malignant neoplasm of large intestine: Secondary | ICD-10-CM | POA: Diagnosis not present

## 2022-09-27 DIAGNOSIS — Z932 Ileostomy status: Secondary | ICD-10-CM | POA: Diagnosis not present

## 2022-09-28 DIAGNOSIS — I10 Essential (primary) hypertension: Secondary | ICD-10-CM | POA: Diagnosis not present

## 2022-09-28 DIAGNOSIS — I482 Chronic atrial fibrillation, unspecified: Secondary | ICD-10-CM | POA: Diagnosis not present

## 2022-09-28 DIAGNOSIS — E782 Mixed hyperlipidemia: Secondary | ICD-10-CM | POA: Diagnosis not present

## 2022-09-28 DIAGNOSIS — I071 Rheumatic tricuspid insufficiency: Secondary | ICD-10-CM | POA: Diagnosis not present

## 2022-09-28 DIAGNOSIS — I340 Nonrheumatic mitral (valve) insufficiency: Secondary | ICD-10-CM | POA: Diagnosis not present

## 2022-09-29 DIAGNOSIS — Z932 Ileostomy status: Secondary | ICD-10-CM | POA: Diagnosis not present

## 2022-09-29 DIAGNOSIS — Z85038 Personal history of other malignant neoplasm of large intestine: Secondary | ICD-10-CM | POA: Diagnosis not present

## 2022-10-06 DIAGNOSIS — Z7901 Long term (current) use of anticoagulants: Secondary | ICD-10-CM | POA: Diagnosis not present

## 2022-10-06 DIAGNOSIS — E538 Deficiency of other specified B group vitamins: Secondary | ICD-10-CM | POA: Diagnosis not present

## 2022-11-08 DIAGNOSIS — Z7901 Long term (current) use of anticoagulants: Secondary | ICD-10-CM | POA: Diagnosis not present

## 2022-11-08 DIAGNOSIS — K439 Ventral hernia without obstruction or gangrene: Secondary | ICD-10-CM | POA: Diagnosis not present

## 2022-11-08 DIAGNOSIS — E538 Deficiency of other specified B group vitamins: Secondary | ICD-10-CM | POA: Diagnosis not present

## 2022-11-08 DIAGNOSIS — R195 Other fecal abnormalities: Secondary | ICD-10-CM | POA: Diagnosis not present

## 2022-11-18 ENCOUNTER — Encounter: Payer: Self-pay | Admitting: Surgery

## 2022-11-18 ENCOUNTER — Ambulatory Visit: Payer: Medicare PPO | Admitting: Surgery

## 2022-11-18 VITALS — BP 145/78 | HR 69 | Temp 98.2°F | Ht 62.0 in | Wt 131.4 lb

## 2022-11-18 DIAGNOSIS — K435 Parastomal hernia without obstruction or  gangrene: Secondary | ICD-10-CM | POA: Diagnosis not present

## 2022-11-18 DIAGNOSIS — Z932 Ileostomy status: Secondary | ICD-10-CM | POA: Diagnosis not present

## 2022-11-18 DIAGNOSIS — Z432 Encounter for attention to ileostomy: Secondary | ICD-10-CM | POA: Insufficient documentation

## 2022-11-18 NOTE — Progress Notes (Signed)
Patient ID: Gabriela [name], female   DOB: 07-11-1932, 89 y.o.   MRN: [id number]  Chief Complaint: Parastomal hernia  History of Present Illness Gabriela [name] is a 89 y.o. female with an end ileostomy following a subtotal colectomy.  She had to have revision of the ileostomy due to the abdominal wall being  too restrictive.  Over time since her surgery she has had gradual progression of a bulge in this area.  She denies any significant pain, nausea, vomiting, abdominal cramping etc.  She did have 1 period of time when she had somewhat diminished output from the ileostomy.  She took it upon herself to actually irrigate the stoma with a syringe which apparently resolved that issue.  We discussed various options today.  Past Medical History Past Medical History:  Diagnosis Date   Acid reflux    Anemia    Anxiety    Atrial fibrillation    Bladder cancer    Breast cancer    Cancer    breast,bladder   Depression    Dysrhythmia    afib   GERD (gastroesophageal reflux disease)    History of hiatal hernia    HOH (hard of hearing)    aids   Hypertension    Hyperthyroidism    Hypothyroidism    Large bowel obstruction 04/08/2021   Stroke    2010   TIA (transient ischemic attack)       Past Surgical History:  Procedure Laterality Date   ABDOMINAL HYSTERECTOMY     ANKLE ARTHODESIS W/ ARTHROSCOPY     BLADDER SURGERY     CATARACT EXTRACTION W/PHACO Left 12/02/2014   Procedure: CATARACT EXTRACTION PHACO AND INTRAOCULAR LENS PLACEMENT (IOC);  Surgeon: Sallee Lange, MD;  Location: [facility] OR[facility];  Service: Ophthalmology;  Laterality: Left;  US01:45 AP%25.7 CDE42.99   CHOLECYSTECTOMY     EYE SURGERY     cataract   ILEOSTOMY CLOSURE N/A 04/15/2021   Procedure: ILEOSTOMY TAKEDOWN REVISION;  Surgeon: Campbell Lerner, MD;  Location: [facility] OR[facility];  Service: General;  Laterality: N/A;  not a takedown, just a revision...   INSERTION OF SUPRAPUBIC CATHETER  04/06/2021   Procedure: INSERTION OF FOLEY  CATHETER;  Surgeon: Sondra Come, MD;  Location: [facility] OR[facility];  Service: Urology;;   JOINT REPLACEMENT     bil tkr   MASTECTOMY     partial right   REPLACEMENT TOTAL KNEE      Allergies  Allergen Reactions   Celebrex [Celecoxib] Anaphylaxis   Morphine And Related Swelling    Lips and mouth   Povidone-Iodine Other (See Comments)    Other reaction(s): ITCHING Other reaction(s): ITCHING    Sulfa Antibiotics     Current Outpatient Medications  Medication Sig Dispense Refill   acetaminophen (TYLENOL) 325 MG tablet Take 2 tablets (650 mg total) by mouth every 6 (six) hours as needed for mild pain or fever.     ALPRAZolam (XANAX) 0.25 MG tablet Take 0.5 tablets (0.125 mg total) by mouth at bedtime as needed for anxiety. 4 tablet 0   amLODipine (NORVASC) 5 MG tablet Take by mouth.     feeding supplement (ENSURE ENLIVE / ENSURE PLUS) LIQD Take 237 mLs by mouth 3 (three) times daily between meals. 237 mL 12   iron polysaccharides (NIFEREX) 150 MG capsule Take 1 capsule (150 mg total) by mouth daily. 90 capsule 0   levothyroxine (SYNTHROID, LEVOTHROID) 112 MCG tablet Take 112 mcg by mouth daily before breakfast.     sertraline (ZOLOFT)  50 MG tablet Take 50 mg by mouth daily. hs     warfarin (COUMADIN) 5 MG tablet Take 1 tablet (5 mg total) by mouth daily. HOLD WARFARIN UNTIL INR Improves and Restarted at the Direction of Cardiology     No current facility-administered medications for this visit.    Family History Family History  Adopted: Yes      Social History Social History   Tobacco Use   Smoking status: Never   Smokeless tobacco: Never  Substance Use Topics   Alcohol use: No   Drug use: Never        Review of Systems  All other systems reviewed and are negative.    Physical Exam Blood pressure (!) 145/78, pulse 69, temperature 98.2 F (36.8 C), temperature source Oral, height 5\' 2"  (1.575 m), weight 131 lb 6.4 oz (59.6 kg), SpO2 98 %. Last Weight  Most recent  update: 11/18/2022  9:58 AM    Weight  59.6 kg (131 lb 6.4 oz)             CONSTITUTIONAL: Well developed, and nourished, appropriately responsive and aware without distress.   EYES: Sclera non-icteric.   EARS, NOSE, MOUTH AND THROAT:  The oropharynx is clear. Oral mucosa is pink and moist.    Hearing is diminished, but intact to voice.  NECK: Trachea is midline, and there is no jugular venous distension.  LYMPH NODES:  Lymph nodes in the neck are not appreciated. RESPIRATORY:   Normal respiratory effort without pathologic use of accessory muscles. CARDIOVASCULAR:  Well perfused.  GI: The abdomen is notable for right lower quadrant ileostomy, associated soft tissue mass adjacent which does not compromise the appliance fitting well.  The mass is soft, reducible.  The remaining abdomen is soft, nontender, and nondistended. There were no palpable masses.  MUSCULOSKELETAL:  Symmetrical muscle tone appreciated in all four extremities.    SKIN: Skin turgor is normal. No pathologic skin lesions appreciated.  NEUROLOGIC:  Motor and sensation appear grossly normal.  Cranial nerves are grossly without defect. PSYCH:  Alert and oriented to person, place and time. Affect is appropriate for situation.  Data Reviewed I have personally reviewed what is currently available of the patient's imaging, recent labs and medical records.   Labs:     Latest Ref Rng & Units 05/14/2021    5:20 AM 05/13/2021    4:22 AM 05/12/2021    3:13 PM  CBC  WBC 4.0 - 10.5 K/uL 7.5  8.3  11.8   Hemoglobin 12.0 - 15.0 g/dL 7.9  8.0  9.1   Hematocrit 36.0 - 46.0 % 25.1  25.8  30.3   Platelets 150 - 400 K/uL 433  424  545       Latest Ref Rng & Units 05/14/2021    5:20 AM 05/13/2021    4:22 AM 05/12/2021    3:13 PM  CMP  Glucose 70 - 99 mg/dL 161  096  045   BUN 8 - 23 mg/dL 14  25  31    Creatinine 0.44 - 1.00 mg/dL 4.09  8.11  9.14   Sodium 135 - 145 mmol/L 139  133  131   Potassium 3.5 - 5.1 mmol/L 3.9  4.2   4.5   Chloride 98 - 111 mmol/L 115  111  104   CO2 22 - 32 mmol/L 19  19  19    Calcium 8.9 - 10.3 mg/dL 8.1  8.1  9.1   Total Protein 6.5 - 8.1  g/dL 6.0     Total Bilirubin 0.3 - 1.2 mg/dL 0.4     Alkaline Phos 38 - 126 U/L 64     AST 15 - 41 U/L 9     ALT 0 - 44 U/L 6         Imaging: Radiological images reviewed:   Within last 24 hrs: No results found.  Assessment     Patient Active Problem List   Diagnosis Date Noted   Parastomal hernia without obstruction or gangrene 11/18/2022   Ileostomy in place 11/18/2022   Malnutrition of moderate degree 05/13/2021   Acute cystitis with hematuria 05/12/2021   Hyponatremia 05/12/2021   AKI (acute kidney injury) 05/12/2021   Dehydration 05/12/2021   Supratherapeutic INR 05/12/2021   Hypertension    Hypothyroidism    Stroke    Hypokalemia    Depression with anxiety    Atrial fibrillation, chronic    Normocytic anemia    Sacroiliitis 07/12/2016   Pain in right hip 07/12/2016    Plan    We discussed options, proceeding with CT imaging potential.  We discussed signs and symptoms of complications involving the parastomal hernia i.e. small bowel obstruction, potential for strangulation etc.  I do not believe any of these issues will arise without her appreciating it.  We discussed the risks of various procedures that may be utilized to repair the hernia.  Moving the stoma to another site, or laparoscopic parastomal hernia repair with mesh placement.  She is not terribly interested in proceeding with an elective surgery at this time/stage of her life.  It seems that the activities she enjoys are not being impacted by the hernia.  We did discuss avoiding any heavy lifting or straining but I would hope she can continue to enjoy gardening and her current activities. We discussed reasons to present urgently to the ER, or to return for follow-up visit in consultation for elective or urgent hernia repair.  I believe she and her daughter  understand all of this and after a good mutual discussion of all the questions and issues present elected to follow-up as needed.  Face-to-face time spent with the patient and accompanying care providers(if present) was 40 minutes, with more than 50% of the time spent counseling, educating, and coordinating care of the patient.    These notes generated with voice recognition software. I apologize for typographical errors.  Campbell Lerner M.D., FACS 11/18/2022, 10:32 AM

## 2022-11-18 NOTE — Patient Instructions (Signed)
Our surgery scheduler Barbara will call you within 24-48 hours to get you scheduled. If you have not heard from her after 48 hours, please call our office. Have the blue sheet available when she calls to write down important information.   If you have any concerns or questions, please feel free to call our office.   Hernia, Adult     A hernia happens when an organ or tissue inside your body pushes out through a weak spot in the muscles of your belly (abdomen). This makes a bulge. The bulge may be: In a scar from a surgery that was done in your belly (incisional hernia). Near your belly button (umbilical hernia). In your groin (inguinal hernia). Your groin is the area where your leg meets your lower belly. If you are a female, this type could also be in your scrotum. In your upper thigh (femoral hernia). Inside your belly (hiatal hernia). This happens when your stomach slides above the muscle between your belly and your chest (diaphragm). What are the causes? This condition may be caused by: Lifting heavy things. Coughing over a long period of time. Having trouble pooping (constipation). Trouble pooping can lead to straining. A cut from surgery in your belly. A physical problem that is present at birth. Being very overweight. Smoking. Too much fluid in your belly. A testicle that has not moved down into the scrotum, in males. What are the signs or symptoms? The main symptom is a bulge in the area of the hernia, but a bulge may not always be seen. It may grow bigger or be easier to see when you cough or strain (such as when lifting something heavy). A hernia that can be pushed back into the belly rarely causes pain. A hernia that cannot be pushed back into the belly may lose its blood supply. This may cause: Pain. Fever. A feeling like you may vomit, and vomiting. Swelling. Trouble pooping. How is this treated? A hernia that is small and painless may not need to be treated. A hernia  that is large or painful may be treated with surgery. Surgery to treat a hernia involves pushing the bulge back into place and repairing the weak area of the muscle or belly. Follow these instructions at home: Activity Avoid straining the muscles near your hernia. This can happen when you: Lift something heavy. Poop (have a bowel movement). Do not lift anything that is heavier than 10 lb (4.5 kg), or the limit that you are told. When you lift something heavy, use your leg muscles. Do not use your back muscles to lift. Prevent trouble pooping If told by your doctor, take steps to prevent trouble pooping. You may need to: Drink enough fluid to keep your pee (urine) pale yellow. Take medicines. You will be told what medicines to take. Eat foods that are high in fiber. These include beans, whole grains, and fresh fruits and vegetables. Limit foods that are high in fat and sugar. These include fried or sweet foods. General instructions When you cough, try to cough gently. You may try to push your hernia back in by gently pressing on it when you are lying down. Do not try to force the bulge back in if it will not go in easily. If you are overweight, work with your doctor to lose weight safely. Do not smoke or use any products that contain nicotine or tobacco. If you need help quitting, ask your doctor. If you will be having surgery, watch your hernia   for changes in shape, size, or color. Tell your doctor if you see any changes. Take over-the-counter and prescription medicines only as told by your doctor. Keep all follow-up visits. Contact a doctor if: You get new pain, swelling, or redness near your hernia. You poop fewer times in a week than normal. You have trouble pooping. You have poop that is more dry than normal. You have poop that is harder or larger than normal. Get help right away if: You have a fever or chills. You have belly pain that gets worse. You feel like you may vomit, or  you vomit. Your hernia cannot be pushed in by gently pressing on it when you are lying down. Your hernia: Changes in shape or size. Changes color. Feels hard, or it hurts when you touch it. These symptoms may be an emergency. Get help right away. Call your local emergency services (911 in the [place] Do not wait to see if the symptoms will go away. Do not drive yourself to the hospital. Summary A hernia happens when an organ or tissue inside your body pushes out through a weak spot in the belly muscles. This creates a bulge. If your hernia is small and it does not hurt, you may not need treatment. If your hernia is large or it hurts, you may need surgery. If you will be having surgery, watch your hernia for changes in shape, size, or color. Tell your doctor about any changes. This information is not intended to replace advice given to you by your health care provider. Make sure you discuss any questions you have with your health care provider. Document Revised: 02/18/2020 Document Reviewed: 02/18/2020 Elsevier Patient Education  2023 [employer]  

## 2022-11-25 DIAGNOSIS — R195 Other fecal abnormalities: Secondary | ICD-10-CM | POA: Diagnosis not present

## 2022-11-28 IMAGING — CR DG ABDOMEN ACUTE W/ 1V CHEST
4 series · 4 of 4 positions shown · non-contrast
Comparison: 04/08/2021 chest radiograph and plain film.

CLINICAL DATA: Ileus.  Small-bowel obstruction

EXAM:
DG ABDOMEN ACUTE WITH 1 VIEW CHEST

[chest pa]
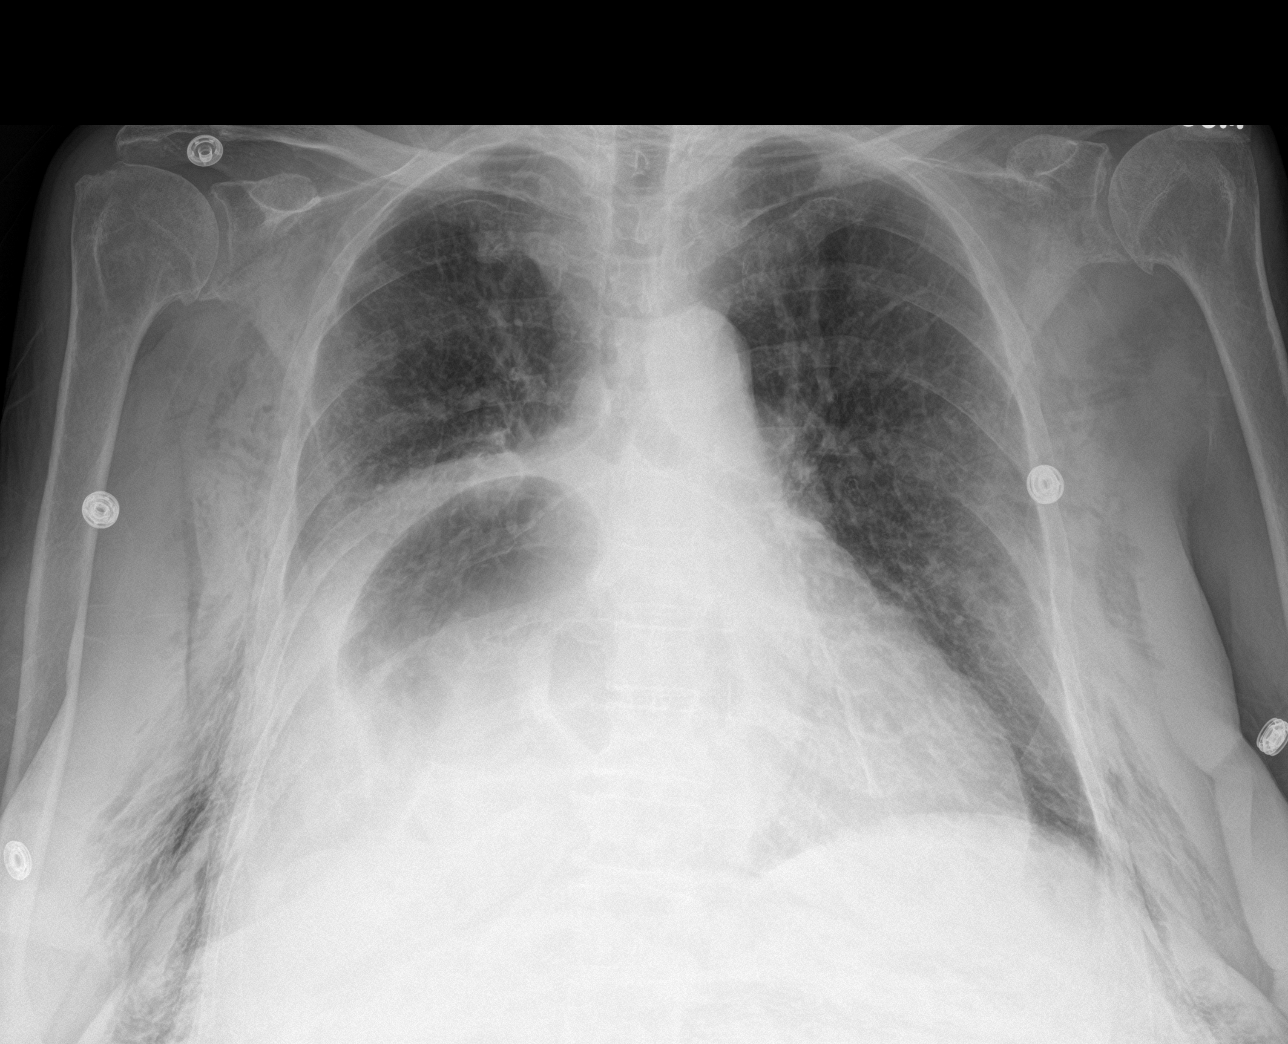

[abdomen erect]
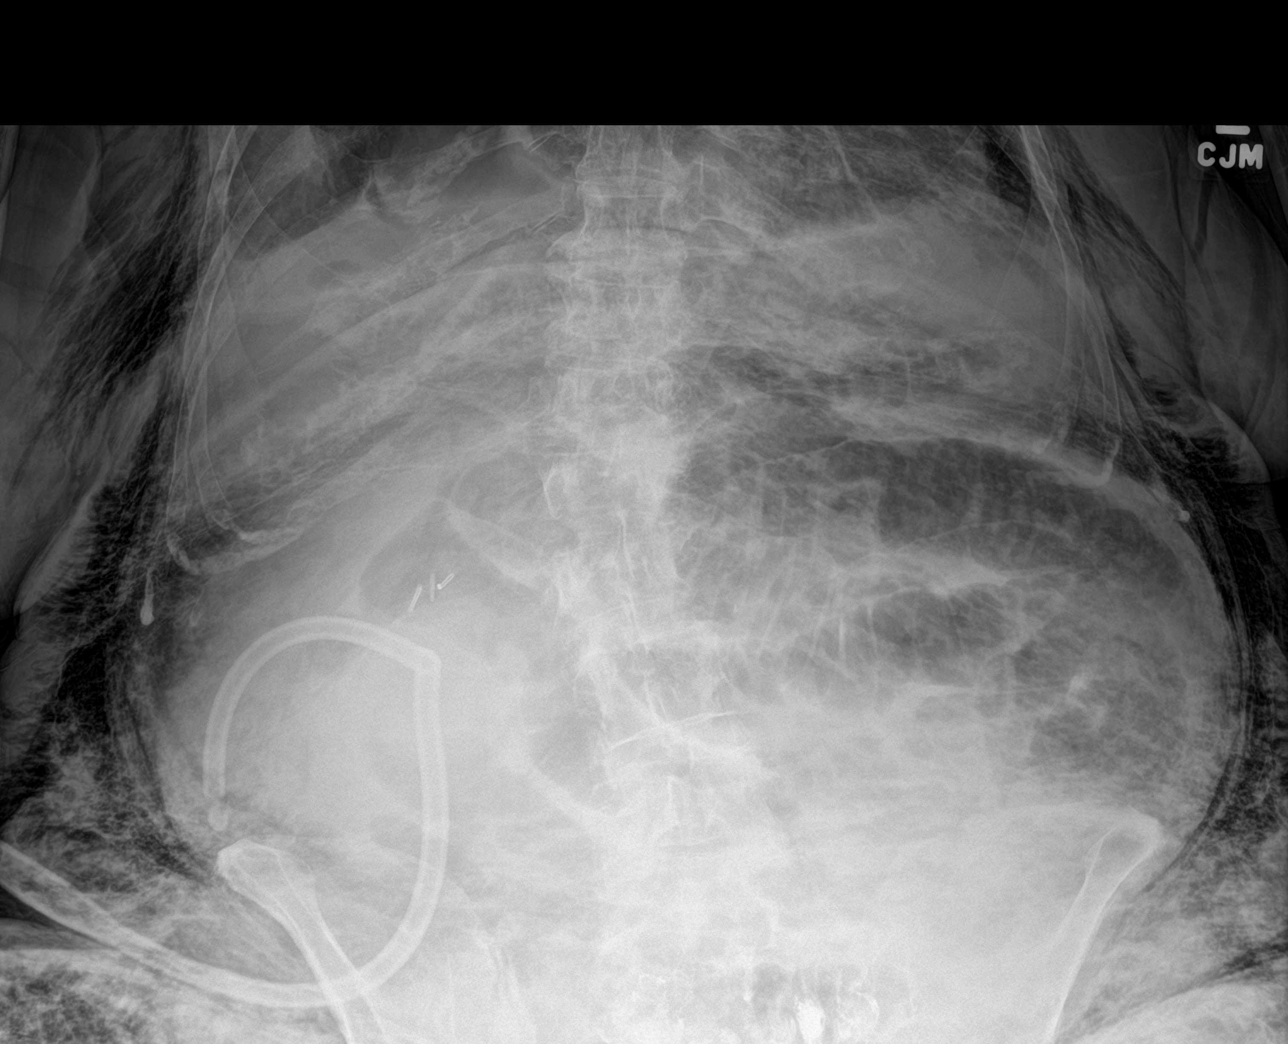

[abdomen supine (1 of 2)]
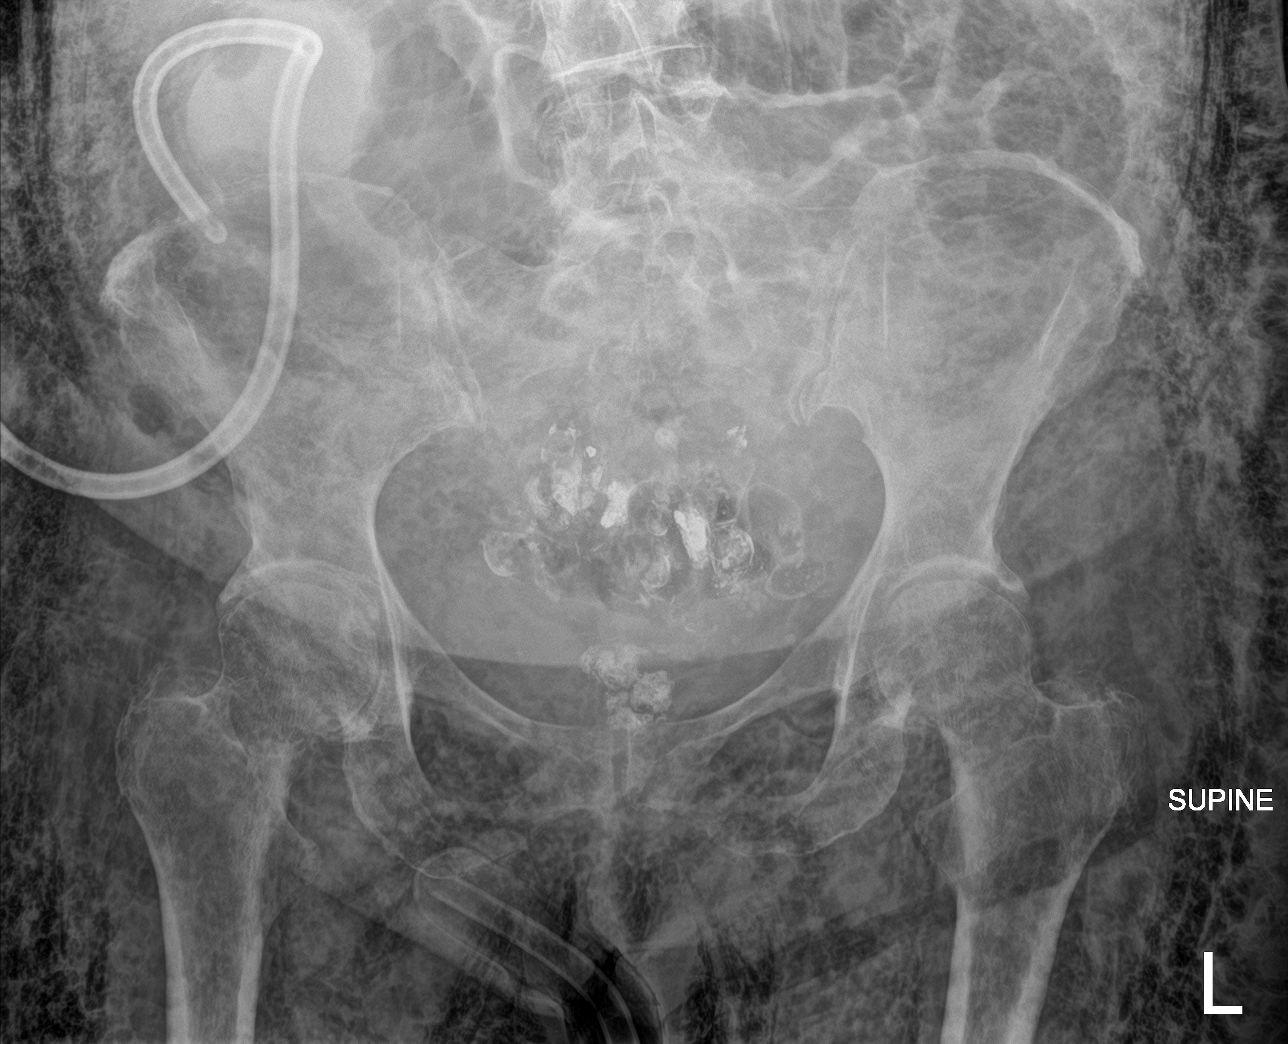

[abdomen supine (2 of 2)]
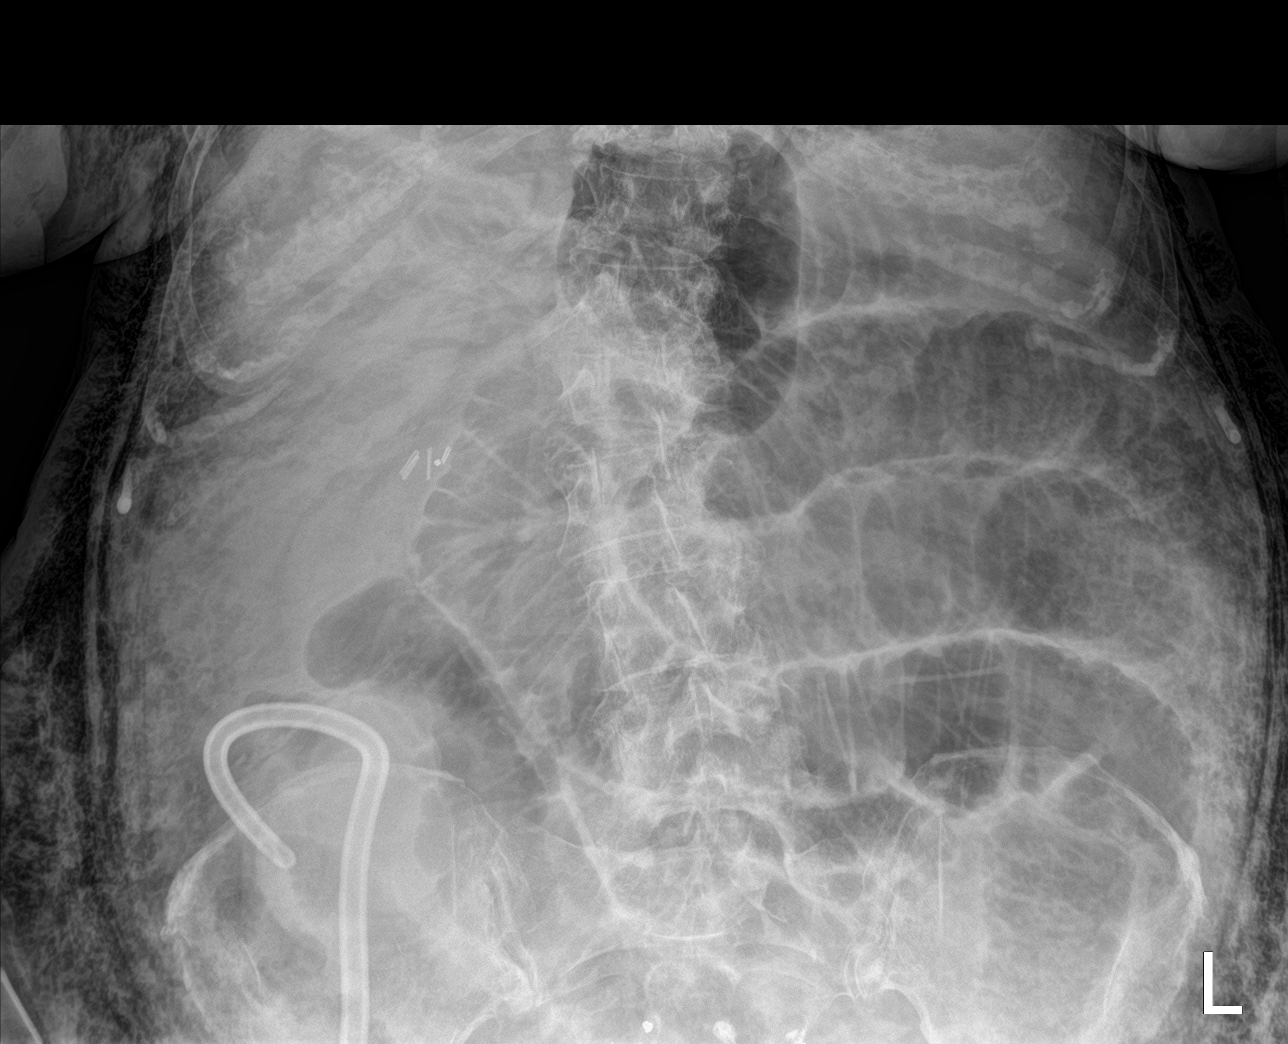

[4 of 4 positions shown; findings below may reference images not displayed]

FINDINGS: Frontal view of the chest demonstrates extensive subcutaneous
emphysema about the chest and abdomen, decreased. midline trachea.
Mild cardiomegaly. Marked right hemidiaphragm elevation. No pleural
effusion or pneumothorax. No congestive failure. Clear lungs.

Abdominal films demonstrate there is a catheter projecting over the
right-side of the abdomen. Gas-filled small bowel loops at up to
cm, similar. Contrast within normal caliber rectum and sigmoid.
IMPRESSION: Persistent gas-filled small bowel loops, favoring adynamic ileus.
Low-grade partial small bowel obstruction could look similar.

Slight improvement in diffuse subcutaneous emphysema about the chest
and abdomen.

Cardiomegaly, without congestive failure or other acute disease.

Marked right hemidiaphragm elevation, as before.

## 2022-11-29 IMAGING — DX DG CHEST 1V PORT
1 series · 1 of 1 positions shown · non-contrast
Comparison: 04/08/2021

CLINICAL DATA: Check PICC line placement

EXAM:
PORTABLE CHEST 1 VIEW

[chest ap]
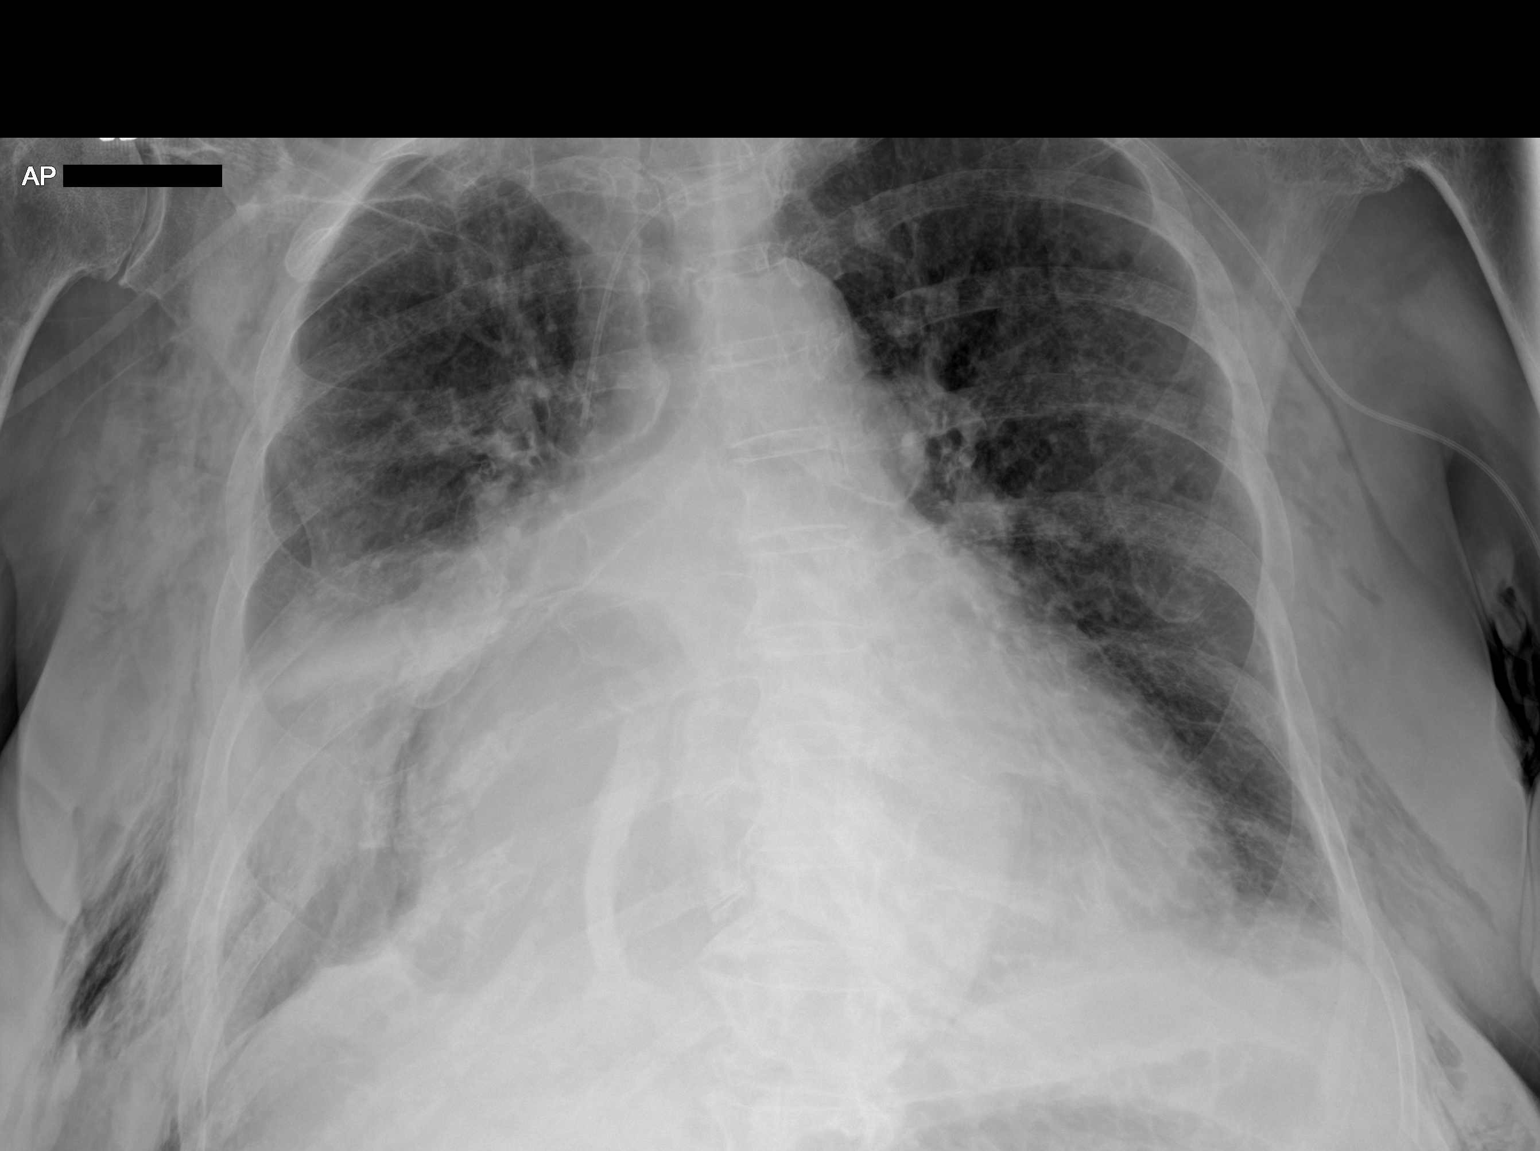

[1 of 1 positions shown; findings below may reference images not displayed]

FINDINGS: Left-sided PICC line is noted extending into the mid superior vena
cava. Aortic calcifications are seen. Heart is enlarged in size but
stable. Persistent chronic hiatal hernia is noted on the right with
evidence of compressive atelectasis. The overall appearance is
similar to that seen on the prior exam. No findings to suggest
pneumothorax are seen. No new focal infiltrate is noted.
IMPRESSION: Stable appearance of the chest when compared with the prior study.

Left-sided PICC line is noted in the mid superior vena cava. This is
approximately 3-4 cm short of the cavoatrial junction but in usable
position.

## 2022-12-08 DIAGNOSIS — Z7901 Long term (current) use of anticoagulants: Secondary | ICD-10-CM | POA: Diagnosis not present

## 2022-12-08 DIAGNOSIS — E538 Deficiency of other specified B group vitamins: Secondary | ICD-10-CM | POA: Diagnosis not present

## 2022-12-14 DIAGNOSIS — Z85038 Personal history of other malignant neoplasm of large intestine: Secondary | ICD-10-CM | POA: Diagnosis not present

## 2022-12-14 DIAGNOSIS — Z932 Ileostomy status: Secondary | ICD-10-CM | POA: Diagnosis not present

## 2023-01-14 DIAGNOSIS — E538 Deficiency of other specified B group vitamins: Secondary | ICD-10-CM | POA: Diagnosis not present

## 2023-01-14 DIAGNOSIS — Z7901 Long term (current) use of anticoagulants: Secondary | ICD-10-CM | POA: Diagnosis not present

## 2023-01-24 DIAGNOSIS — R791 Abnormal coagulation profile: Secondary | ICD-10-CM | POA: Diagnosis not present

## 2023-02-01 DIAGNOSIS — M7061 Trochanteric bursitis, right hip: Secondary | ICD-10-CM | POA: Diagnosis not present

## 2023-02-07 DIAGNOSIS — R791 Abnormal coagulation profile: Secondary | ICD-10-CM | POA: Diagnosis not present

## 2023-02-08 DIAGNOSIS — Z932 Ileostomy status: Secondary | ICD-10-CM | POA: Diagnosis not present

## 2023-02-08 DIAGNOSIS — Z85038 Personal history of other malignant neoplasm of large intestine: Secondary | ICD-10-CM | POA: Diagnosis not present

## 2023-02-22 DIAGNOSIS — H903 Sensorineural hearing loss, bilateral: Secondary | ICD-10-CM | POA: Diagnosis not present

## 2023-02-22 DIAGNOSIS — H6123 Impacted cerumen, bilateral: Secondary | ICD-10-CM | POA: Diagnosis not present

## 2023-03-04 ENCOUNTER — Emergency Department: Payer: Medicare PPO

## 2023-03-04 ENCOUNTER — Other Ambulatory Visit: Payer: Self-pay

## 2023-03-04 ENCOUNTER — Emergency Department
Admission: EM | Admit: 2023-03-04 | Discharge: 2023-03-04 | Disposition: A | Discharge status: Home / Self Care | Payer: Medicare PPO | Source: Home / Self Care | Attending: Emergency Medicine | Admitting: Emergency Medicine

## 2023-03-04 DIAGNOSIS — M899 Disorder of bone, unspecified: Secondary | ICD-10-CM | POA: Diagnosis not present

## 2023-03-04 DIAGNOSIS — K5730 Diverticulosis of large intestine without perforation or abscess without bleeding: Secondary | ICD-10-CM | POA: Diagnosis not present

## 2023-03-04 DIAGNOSIS — R590 Localized enlarged lymph nodes: Secondary | ICD-10-CM | POA: Diagnosis not present

## 2023-03-04 DIAGNOSIS — Z8551 Personal history of malignant neoplasm of bladder: Secondary | ICD-10-CM

## 2023-03-04 DIAGNOSIS — R102 Pelvic and perineal pain: Secondary | ICD-10-CM | POA: Diagnosis not present

## 2023-03-04 DIAGNOSIS — Z7901 Long term (current) use of anticoagulants: Secondary | ICD-10-CM | POA: Insufficient documentation

## 2023-03-04 DIAGNOSIS — R918 Other nonspecific abnormal finding of lung field: Secondary | ICD-10-CM | POA: Diagnosis not present

## 2023-03-04 DIAGNOSIS — Z90710 Acquired absence of both cervix and uterus: Secondary | ICD-10-CM | POA: Diagnosis not present

## 2023-03-04 DIAGNOSIS — M85851 Other specified disorders of bone density and structure, right thigh: Secondary | ICD-10-CM | POA: Diagnosis not present

## 2023-03-04 DIAGNOSIS — S79911A Unspecified injury of right hip, initial encounter: Secondary | ICD-10-CM | POA: Diagnosis not present

## 2023-03-04 DIAGNOSIS — Z853 Personal history of malignant neoplasm of breast: Secondary | ICD-10-CM | POA: Diagnosis not present

## 2023-03-04 DIAGNOSIS — M898X5 Other specified disorders of bone, thigh: Secondary | ICD-10-CM

## 2023-03-04 DIAGNOSIS — M25551 Pain in right hip: Secondary | ICD-10-CM | POA: Diagnosis not present

## 2023-03-04 DIAGNOSIS — Z85038 Personal history of other malignant neoplasm of large intestine: Secondary | ICD-10-CM | POA: Diagnosis not present

## 2023-03-04 DIAGNOSIS — C7951 Secondary malignant neoplasm of bone: Secondary | ICD-10-CM | POA: Insufficient documentation

## 2023-03-04 DIAGNOSIS — R008 Other abnormalities of heart beat: Secondary | ICD-10-CM | POA: Insufficient documentation

## 2023-03-04 DIAGNOSIS — M84454A Pathological fracture, pelvis, initial encounter for fracture: Secondary | ICD-10-CM | POA: Diagnosis not present

## 2023-03-04 DIAGNOSIS — S3993XA Unspecified injury of pelvis, initial encounter: Secondary | ICD-10-CM | POA: Diagnosis not present

## 2023-03-04 LAB — PROTIME-INR
INR: 3.5 — ABNORMAL HIGH (ref 0.8–1.2)
Prothrombin Time: 35.6 seconds — ABNORMAL HIGH (ref 11.4–15.2)

## 2023-03-04 LAB — BASIC METABOLIC PANEL
Anion gap: 8 (ref 5–15)
BUN: 16 mg/dL (ref 8–23)
CO2: 23 mmol/L (ref 22–32)
Calcium: 9 mg/dL (ref 8.9–10.3)
Chloride: 109 mmol/L (ref 98–111)
Creatinine, Ser: 0.95 mg/dL (ref 0.44–1.00)
GFR, Estimated: 57 mL/min — ABNORMAL LOW (ref >60)
Glucose, Bld: 119 mg/dL — ABNORMAL HIGH (ref 70–99)
Potassium: 4 mmol/L (ref 3.5–5.1)
Sodium: 140 mmol/L (ref 135–145)

## 2023-03-04 MED ORDER — IOHEXOL 300 MG/ML  SOLN
80.0000 mL | Freq: Once | INTRAMUSCULAR | Status: AC | PRN
  Administered 2023-03-04: 80 mL via INTRAVENOUS

## 2023-03-04 MED ORDER — GADOBUTROL 1 MMOL/ML IV SOLN
5.0000 mL | Freq: Once | INTRAVENOUS | Status: AC | PRN
  Administered 2023-03-04: 5 mL via INTRAVENOUS

## 2023-03-04 NOTE — Discharge Instructions (Addendum)
You were seen in the emergency department today for your right hip pain.  We found new lytic lesions concerning for cancer in your pelvis.  The oncologist has also seen you today and will work for a follow-up plan for that and further evaluation of your potential cancer.  Regarding the spots on your pelvis, I would like you to follow-up with either [facility] or [facility] orthopedic oncology teams for further evaluation.  Please use her walker when ambulating as any fall puts you at risk for a fracture of your pelvis.  Please return with any worsening symptoms.

## 2023-03-04 NOTE — ED Provider Notes (Signed)
[facility] Provider Note    Event Date/Time   First MD Initiated Contact with Patient 03/04/23 1003     (approximate)   History   Hip Pain   HPI  Gabriela [name] is a 89 y.o. female past medical history diffident for atrial fibrillation on Coumadin who presents to the emergency department with right-sided hip pain.  States that she has been having right-sided hip pain that has been ongoing but has worsened over the past week.  Had a fall 1 week ago when she was planting flowers in her garden.  States that she landed on her right hip.  Taken Tylenol for pain control.  Pain is okay at rest but when she goes to bear weight and ambulate states that she has pain in her hip.  Denies any lower back pain.  Denies any abdominal pain.  Denies any numbness or weakness.     Physical Exam   Triage Vital Signs: ED Triage Vitals [03/04/23 0943]  Encounter Vitals Group     BP (!) 142/84     Systolic BP Percentile      Diastolic BP Percentile      Pulse Rate 79     Resp 20     Temp 98.4 F (36.9 C)     Temp src      SpO2 96 %     Weight 130 lb (59 kg)     Height 5\' 3"  (1.6 m)     Head Circumference      Peak Flow      Pain Score 0     Pain Loc      Pain Education      Exclude from Growth Chart     Most recent vital signs: Vitals:   03/04/23 0943  BP: (!) 142/84  Pulse: 79  Resp: 20  Temp: 98.4 F (36.9 C)  SpO2: 96%    Physical Exam Constitutional:      Appearance: She is well-developed.  HENT:     Head: Atraumatic.  Eyes:     Conjunctiva/sclera: Conjunctivae normal.  Cardiovascular:     Rate and Rhythm: Rhythm irregular.  Pulmonary:     Effort: No respiratory distress.  Abdominal:     General: There is no distension.     Tenderness: There is no abdominal tenderness. There is no right CVA tenderness or left CVA tenderness.     Comments: Ileostomy bag in place  Musculoskeletal:        General: Normal range of motion.     Cervical back:  Normal range of motion.     Comments: No midline cervical, thoracic or lumbar tenderness to palpation.  Mild tenderness to palpation to the right hip.  Negative straight leg raise.  No tenderness to the right femur, knee or ankle.  Able to bear weight in the emergency department.  Palpable and symmetric DP pulses  Skin:    General: Skin is warm.  Neurological:     Mental Status: She is alert. Mental status is at baseline.      IMPRESSION / MDM / ASSESSMENT AND PLAN / ED COURSE  I reviewed the triage vital signs and the nursing notes.  Differential diagnosis including musculoskeletal strain, hip fracture, occult hip fracture, radiculopathy  Patient also states that she is due for her INR check on Monday and request that we check it in the emergency department   RADIOLOGY my interpretation of imaging: X-ray of the right hip on my  evaluation with no acute fracture or dislocation  X-ray of the right hip was read as no acute fracture or dislocation.  Questionable lucency area and recommended CT scan Noncon  CT scan Noncon with questionable lytic lesions and recommended MRI with and without contrast.   Labs (all labs ordered are listed, but only abnormal results are displayed) Labs interpreted as -    Labs Reviewed  PROTIME-INR - Abnormal; Notable for the following components:      Result Value   Prothrombin Time 35.6 (*)    INR 3.5 (*)    All other components within normal limits      MRI with and without contrast ordered.  Results currently pending.  Care transferred to incoming provider.   PROCEDURES:  Critical Care performed: No  Procedures  Patient's presentation is most consistent with acute complicated illness / injury requiring diagnostic workup.   MEDICATIONS ORDERED IN ED: Medications  gadobutrol (GADAVIST) 1 MMOL/ML injection 5 mL (5 mLs Intravenous Contrast Given 03/04/23 1411)    FINAL CLINICAL IMPRESSION(S) / ED DIAGNOSES   Final diagnoses:  Right hip  pain     Rx / DC Orders   ED Discharge Orders     None        Note:  This document was prepared using Dragon voice recognition software and may include unintentional dictation errors.   Corena Herter, MD 03/04/23 1549

## 2023-03-04 NOTE — ED Triage Notes (Signed)
Pt to ED for right hip pain, states has had pain for awhile, but fell this past week and pain has gotten worse. Denies hitting head or LOC

## 2023-03-04 NOTE — ED Provider Notes (Signed)
  Physical Exam  BP (!) 142/84   Pulse 79   Temp 98.4 F (36.9 C)   Resp 20   Ht 5\' 3"  (1.6 m)   Wt 59 kg   LMP  (LMP Unknown)   SpO2 96%   BMI 23.03 kg/m   Physical Exam I have reviewed the vital signs. General:  Awake, alert, no acute distress. Head:  Normocephalic, Atraumatic. EENT:  PERRL, EOMI, Oral mucosa pink and moist, Neck is supple. Cardiovascular: Regular rate, 2+ distal pulses. Respiratory:  Normal respiratory effort, symmetrical expansion, no distress.   Extremities:  Moving all four extremities through full ROM without pain.   Neuro:  Alert and oriented.  Interacting appropriately.   Skin:  Warm, dry, no rash.   Psych: Appropriate affect.   Procedures  Procedures  ED Course / MDM   Clinical Course as of 03/04/23 1918  Fri Mar 04, 2023  1623 Orthopedics - patient is at risk for a fracture. But lesions are in a pattern that her risk is on the lower side and does not need prophylactic surgery on it at this time. She should use an assistive device while ambulating on it. Follow up with [facility]o oncology at [facility] or [facility] for further care. More concerning disease is in her ilium [DW]    Clinical Course User Index [DW] Janith Lima, MD   Medical Decision Making Patient was signed out to me by Dr. Arnoldo Morale.  In brief, patient is a 89-year-old female with right-sided hip pain for the past week.  She did have a previous fall.  Still ambulating on it without significant pain.  Initial workup showed concern for possible lytic lesions in the pelvis with history of prior cancer concerning for possible malignancy.  Signed out following MRI follow-up.  Patient was found to have multiple lytic lesions in her pelvis.  Oncology was consulted and evaluated patient with planned outpatient follow-up.  Recommended CT chest and abdomen for further evaluation of potential primary source of malignancy.  Orthopedics was also consulted and recommended no acute intervention at this time but  recommended patient use a walker when ambulating due to increased risk for pathologic fracture.  Patient does have a walker and will use this.  CT abdomen/pelvis was performed with concern for possible primary malignancy.  Patient otherwise safe for discharge and will follow up with oncology teams and orthopedic outpatient as planned.  She was given strict return precautions.  Problems Addressed: Lytic bone lesion of hip: acute illness or injury that poses a threat to life or bodily functions Right hip pain: acute illness or injury  Amount and/or Complexity of Data Reviewed Labs: ordered. Radiology: ordered.  Risk Prescription drug management.     Janith Lima, MD 03/04/23 Jerene Bears

## 2023-03-04 NOTE — ED Notes (Signed)
Patient transported to MRI 

## 2023-03-05 NOTE — Consult Note (Signed)
Hematology/Oncology Consult note [facility] Telephone:([phone] Fax:([phone]) [phone]  Patient Care Team: Marina Goodell, MD as PCP - General (Family Medicine)   Name of the patient: Gabriela [name]  [id number]  24-Mar-1932    Reason for consult: Concern for bone metastases   Requesting physician: Dr. Anner Crete  Date of visit: 03/04/23   History of presenting illness-patient is a 89 year old female with a past medical history significant for breast cancer about 20 years ago.  She had bladder cancer about 10 years ago.  She was diagnosed with colon cancer 3 years ago T3 N0 disease.  She was not seen by medical oncology at that time due to personal preference.  Details of her breast are not known.  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  Patient lives alone and has been having pain during ambulation which has been getting significantly worse.  Previously she was able to ambulate without a walker or a cane but has been needing to use walker over the last 1 week.CT pelvis without contrast and MRI pelvis with and without contrast showed multiple marrow replacing bone lesions within the pelvis largest within the right iliac wing measuring 6 cm in size.  The lesion is expansile with significant cortical thickening and pathologic fracture.  Small amount of extraosseous soft tissue component which extends into the adjacent gluteus minimus muscle.  Additional lesions include the right acetabulum right femoral head sacrum and left acetabulum.  Appearance favors metastatic disease or myeloma.  Mildly enlarged left inguinal lymph node 1.1 cm nonspecific.  Patient states that she has had a right breast mass for many years now and she was told that it may be scar tissue.  ECOG PS- 2  Pain scale- 4   Review of systems- Review of Systems  Constitutional:  Negative for chills, fever, malaise/fatigue and weight loss.  HENT:  Negative for congestion, ear  discharge and nosebleeds.   Eyes:  Negative for blurred vision.  Respiratory:  Negative for cough, hemoptysis, sputum production, shortness of breath and wheezing.   Cardiovascular:  Negative for chest pain, palpitations, orthopnea and claudication.  Gastrointestinal:  Negative for abdominal pain, blood in stool, constipation, diarrhea, heartburn, melena, nausea and vomiting.  Genitourinary:  Negative for dysuria, flank pain, frequency, hematuria and urgency.  Musculoskeletal:  Negative for back pain, joint pain and myalgias.       Pelvic pain mainly during ambulation  Skin:  Negative for rash.  Neurological:  Negative for dizziness, tingling, focal weakness, seizures, weakness and headaches.  Endo/Heme/Allergies:  Does not bruise/bleed easily.  Psychiatric/Behavioral:  Negative for depression and suicidal ideas. The patient does not have insomnia.     Allergies  Allergen Reactions   Celebrex [Celecoxib] Anaphylaxis   Morphine And Codeine Swelling    Lips and mouth   Povidone-Iodine Other (See Comments)    Other reaction(s): ITCHING Other reaction(s): ITCHING    Sulfa Antibiotics     Patient Active Problem List   Diagnosis Date Noted   Parastomal hernia without obstruction or gangrene 11/18/2022   Ileostomy in place [facility]) 11/18/2022   Malnutrition of moderate degree 05/13/2021   Acute cystitis with hematuria 05/12/2021   Hyponatremia 05/12/2021   AKI (acute kidney injury) (HCC) 05/12/2021   Dehydration 05/12/2021   Supratherapeutic INR 05/12/2021   Hypertension    Hypothyroidism    Stroke (HCC)    Hypokalemia    Depression with anxiety    Atrial fibrillation, chronic (HCC)    Normocytic anemia  Sacroiliitis (HCC) 07/12/2016   Pain in right hip 07/12/2016     Past Medical History:  Diagnosis Date   Acid reflux    Anemia    Anxiety    Atrial fibrillation (HCC)    Bladder cancer (HCC)    Breast cancer (HCC)    Cancer (HCC)    breast,bladder   Depression     Dysrhythmia    afib   GERD (gastroesophageal reflux disease)    History of hiatal hernia    HOH (hard of hearing)    aids   Hypertension    Hyperthyroidism    Hypothyroidism    Large bowel obstruction (HCC) 04/08/2021   Stroke (HCC)    2010   TIA (transient ischemic attack)      Past Surgical History:  Procedure Laterality Date   ABDOMINAL HYSTERECTOMY     ANKLE ARTHODESIS W/ ARTHROSCOPY     BLADDER SURGERY     CATARACT EXTRACTION W/PHACO Left 12/02/2014   Procedure: CATARACT EXTRACTION PHACO AND INTRAOCULAR LENS PLACEMENT (IOC);  Surgeon: Sallee Lange, MD;  Location: [facility] ORS;  Service: Ophthalmology;  Laterality: Left;  US01:45 AP%25.7 CDE42.99   CHOLECYSTECTOMY     EYE SURGERY     cataract   ILEOSTOMY CLOSURE N/A 04/15/2021   Procedure: ILEOSTOMY TAKEDOWN REVISION;  Surgeon: Campbell Lerner, MD;  Location: [facility] ORS;  Service: General;  Laterality: N/A;  not a takedown, just a revision...   INSERTION OF SUPRAPUBIC CATHETER  04/06/2021   Procedure: INSERTION OF FOLEY CATHETER;  Surgeon: Sondra Come, MD;  Location: [facility] ORS;  Service: Urology;;   JOINT REPLACEMENT     bil tkr   MASTECTOMY     partial right   REPLACEMENT TOTAL KNEE      Social History   Socioeconomic History   Marital status: Widowed    Spouse name: Not on file   Number of children: Not on file   Years of education: Not on file   Highest education level: Not on file  Occupational History   Not on file  Tobacco Use   Smoking status: Never   Smokeless tobacco: Never  Substance and Sexual Activity   Alcohol use: No   Drug use: Never   Sexual activity: Not on file  Other Topics Concern   Not on file  Social History Narrative   Not on file   Social Determinants of Health   Financial Resource Strain: Not on file  Food Insecurity: Not on file  Transportation Needs: Not on file  Physical Activity: Not on file  Stress: Not on file  Social Connections: Not on file  Intimate Partner  Violence: Not on file     Family History  Adopted: Yes    No current facility-administered medications for this encounter.  Current Outpatient Medications:    acetaminophen (TYLENOL) 325 MG tablet, Take 2 tablets (650 mg total) by mouth every 6 (six) hours as needed for mild pain or fever., Disp: , Rfl:    ALPRAZolam (XANAX) 0.25 MG tablet, Take 0.5 tablets (0.125 mg total) by mouth at bedtime as needed for anxiety., Disp: 4 tablet, Rfl: 0   amLODipine (NORVASC) 5 MG tablet, Take by mouth., Disp: , Rfl:    feeding supplement (ENSURE ENLIVE / ENSURE PLUS) LIQD, Take 237 mLs by mouth 3 (three) times daily between meals., Disp: 237 mL, Rfl: 12   iron polysaccharides (NIFEREX) 150 MG capsule, Take 1 capsule (150 mg total) by mouth daily., Disp: 90 capsule, Rfl: 0  levothyroxine (SYNTHROID, LEVOTHROID) 112 MCG tablet, Take 112 mcg by mouth daily before breakfast., Disp: , Rfl:    sertraline (ZOLOFT) 50 MG tablet, Take 50 mg by mouth daily. hs, Disp: , Rfl:    warfarin (COUMADIN) 5 MG tablet, Take 1 tablet (5 mg total) by mouth daily. HOLD WARFARIN UNTIL INR Improves and Restarted at the Direction of Cardiology, Disp: , Rfl:    Physical exam:  Vitals:   03/04/23 0943 03/04/23 1730 03/04/23 1913  BP: (!) 142/84 (!) 153/73 (!) 141/76  Pulse: 79 79 66  Resp: 20  20  Temp: 98.4 F (36.9 C)    SpO2: 96% 94% 98%  Weight: 130 lb (59 kg)    Height: 5\' 3"  (1.6 m)     Physical Exam Constitutional:      Comments: Sitting in a wheelchair and appears in no acute distress  Cardiovascular:     Rate and Rhythm: Normal rate and regular rhythm.     Heart sounds: Normal heart sounds.  Pulmonary:     Effort: Pulmonary effort is normal.     Breath sounds: Normal breath sounds.  Abdominal:     General: Bowel sounds are normal.     Palpations: Abdomen is soft.  Skin:    General: Skin is warm and dry.  Neurological:     Mental Status: She is alert and oriented to person, place, and time.   Breast  exam: There is a palpable 4 cm mass in the upper inner quadrant of the right breast       Latest Ref Rng & Units 03/04/2023    4:35 PM  CMP  Glucose 70 - 99 mg/dL 409   BUN 8 - 23 mg/dL 16   Creatinine 8.11 - 1.00 mg/dL 9.14   Sodium 782 - 956 mmol/L 140   Potassium 3.5 - 5.1 mmol/L 4.0   Chloride 98 - 111 mmol/L 109   CO2 22 - 32 mmol/L 23   Calcium 8.9 - 10.3 mg/dL 9.0       Latest Ref Rng & Units 05/14/2021    5:20 AM  CBC  WBC 4.0 - 10.5 K/uL 7.5   Hemoglobin 12.0 - 15.0 g/dL 7.9   Hematocrit 21.3 - 46.0 % 25.1   Platelets 150 - 400 K/uL 433     @IMAGES @  CT ABDOMEN W CONTRAST  Result Date: 03/04/2023 CLINICAL DATA:  Skeletal metastatic disease seen in the pelvis EXAM: CT ABDOMEN WITH CONTRAST TECHNIQUE: Multidetector CT imaging of the abdomen was performed using the standard protocol following bolus administration of intravenous contrast. RADIATION DOSE REDUCTION: This exam was performed according to the departmental dose-optimization program which includes automated exposure control, adjustment of the mA and/or kV according to patient size and/or use of iterative reconstruction technique. CONTRAST:  80mL OMNIPAQUE IOHEXOL 300 MG/ML  SOLN COMPARISON:  None Available. FINDINGS: Lower chest: Described in the CT chest. Hepatobiliary: No focal abnormalities are seen in liver. Surgical clips are seen in gallbladder fossa. There is mild dilation of intrahepatic bile ducts. Pancreas: Unremarkable. Spleen: Unremarkable. Adrenals/Urinary Tract: Adrenals are unremarkable. There is no hydronephrosis in the right kidney. There is mild left hydronephrosis. There are no renal stones. There is no significant dilation of visualized portions of ureters. There are multiple smoothly marginated low-density lesions in both kidneys largest measuring 5.6 cm suggestion bilateral renal cysts. Stomach/Bowel: Large hiatal hernia is seen. A small amount of serous fluid in the hiatal hernia sac. There is  evidence of previous subtotal colectomy  with ileostomy in right mid abdomen. There is herniation of small bowel loops adjacent to the ileostomy. Diverticula are seen in visualized portions of sigmoid colon. Vascular/Lymphatic: Arterial calcifications are seen in aorta and its major branches. Other: There is no ascites or pneumoperitoneum in the abdomen. Musculoskeletal: There is a large lytic lesion in the right iliac bone. This finding is fully described in the CT pelvis. Dextroscoliosis is seen in lumbar spine. Degenerative changes are noted at multiple levels with encroachment of neural foramina. IMPRESSION: There is no evidence of intestinal obstruction or pneumoperitoneum. Mild left hydronephrosis, possibly suggesting ureteropelvic junction obstruction. No focal abnormalities are seen in liver and adrenals. Large hiatal hernia. Bilateral renal cysts. Diverticulosis in sigmoid colon. Aortic arteriosclerosis. Lumbar spondylosis. There is a large lytic lesion in right iliac bone suggesting metastatic disease. Ileostomy is noted in right mid abdomen. Para ostial hernia containing small bowel loops without signs of obstruction. Electronically Signed   By: Ernie Avena M.D.   On: 03/04/2023 18:57   CT Chest W Contrast  Result Date: 03/04/2023 CLINICAL DATA:  Skeletal metastatic disease, evaluate for primary malignancy EXAM: CT CHEST WITH CONTRAST TECHNIQUE: Multidetector CT imaging of the chest was performed during intravenous contrast administration. RADIATION DOSE REDUCTION: This exam was performed according to the departmental dose-optimization program which includes automated exposure control, adjustment of the mA and/or kV according to patient size and/or use of iterative reconstruction technique. CONTRAST:  80mL OMNIPAQUE IOHEXOL 300 MG/ML  SOLN COMPARISON:  04/21/2007 FINDINGS: Cardiovascular: Heart is enlarged in size. There is small to moderate pericardial effusion. There is homogeneous enhancement  in thoracic aorta. There are no intraluminal filling defects in central pulmonary artery branches. Coronary artery calcifications are seen. Mediastinum/Nodes: Pericardial recess is noted adjacent to the ascending thoracic aorta. No significant lymphadenopathy is seen. Lungs/Pleura: There are linear densities in both lower lung fields, more so on the right side suggesting subsegmental atelectasis. No discrete lung nodules are seen. There is no pleural effusion or pneumothorax. There is large hiatal hernia under the right hemidiaphragm. There is small amount of fluid within the hernial sac. Upper Abdomen: Will be evaluated in the CT abdomen. Musculoskeletal: There is 2.9 x 3.4 cm lobulated soft tissue mass in the medial right breast. There is skin thickening adjacent to this mass. This lesion may suggest primary malignant neoplasm in the right breast. There is decrease in height of body of T1 vertebra without definite break in the cortical margins. There is a decrease in height of upper endplate of body of T7 vertebra with sclerosis. Possibility of recent compression fracture is not excluded. Degenerative changes are noted at thoracolumbar junction at T12-L1 level with minimal retrolisthesis and bony spurs. No definite focal lytic lesions are seen. IMPRESSION: There is 3.4 cm lobulated soft tissue mass in the medial aspect of right breast suggesting possible primary malignant neoplasm in the right breast. There is 20-30% decrease in height of upper endplate of body of T7 vertebra. This may suggest traumatic fracture or pathological fracture. As far as seen, no definite focal lytic lesions are seen in the bony structures in thorax. Cardiomegaly. Small to moderate pericardial effusion. Coronary artery disease. Large hiatal hernia. Small amount of fluid is seen in the hernial sac in the hiatal hernia. Linear patchy densities are seen in both lower lung fields, more so on the right side suggesting scarring and  subsegmental atelectasis. Electronically Signed   By: Ernie Avena M.D.   On: 03/04/2023 18:41   MR PELVIS W  WO CONTRAST  Result Date: 03/04/2023 CLINICAL DATA:  Pelvic pain.  Lytic lesions seen on CT EXAM: MRI PELVIS WITHOUT AND WITH CONTRAST TECHNIQUE: Multiplanar multisequence MR imaging of the pelvis was performed both before and after administration of intravenous contrast. CONTRAST:  5mL GADAVIST GADOBUTROL 1 MMOL/ML IV SOLN COMPARISON:  CT 03/04/2023 FINDINGS: Bones/Joint/Cartilage Multiple marrow replacing bone lesions within the pelvis. Largest lesion within the right iliac wing measures 6.0 x 2.2 x 5.9 cm. This lesion is expansile with significant cortical thinning and pathologic fracture. Small amount of extraosseous soft tissue component which extends into the adjacent gluteus minimus muscle (series 10, image 12). Additional lesion within the superior right acetabulum measures up to 4.8 cm in size. This does appear to extend intra-articularly into the superior aspect of the right hip joint (series 11, image 23). Additional smaller lesions within the posterior right acetabulum and within the sacrum at S1 (the S1 segment is lumbarized). There is also a tiny enhancing lesions within the posterior aspect of the right femoral head and posterior left acetabulum. Bony pelvis remains intact without diastasis. Bilateral hip joints are intact without fracture or dislocation. Degenerative changes of both hips, right worse than left. Ligaments Intact. Muscles and Tendons Partial tearing of the left gluteus minimus tendon, likely chronic. Soft tissues Mildly enlarged left inguinal lymph node measuring 1.1 cm. Extensive colonic diverticulosis. IMPRESSION: 1. Multiple marrow replacing bone lesions within the pelvis, largest within the right iliac wing measuring up to 6.0 cm in size. This lesion is expansile with significant cortical thinning and pathologic fracture. Small amount of extraosseous soft tissue  component which extends into the adjacent gluteus minimus muscle. 2. Additional lesions include the right acetabulum, right femoral head, sacrum, and left acetabulum. Appearance favors metastatic disease or myeloma. 3. Mildly enlarged left inguinal lymph node measuring 1.1 cm, nonspecific. 4. Partial tearing of the left gluteus minimus tendon, likely chronic. Electronically Signed   By: Duanne Guess D.O.   On: 03/04/2023 15:36   CT PELVIS WO CONTRAST  Result Date: 03/04/2023 CLINICAL DATA:  Pain after injury EXAM: CT PELVIS WITHOUT CONTRAST TECHNIQUE: Multidetector CT imaging of the pelvis was performed following the standard protocol without intravenous contrast. RADIATION DOSE REDUCTION: This exam was performed according to the departmental dose-optimization program which includes automated exposure control, adjustment of the mA and/or kV according to patient size and/or use of iterative reconstruction technique. COMPARISON:  None Available. FINDINGS: Urinary Tract:  Preserved contours of the urinary bladder. Bowel: Sigmoid colon diverticulosis. The visualized bowel is nondilated the pelvis. There is a right lower quadrant ostomy with a stomal hernia of small bowel. Please correlate with the history Vascular/Lymphatic: Mild scattered vascular calcifications are identified. No specific abnormal lymph node enlargement identified in the visualized pelvis. Reproductive:  Uterus is absent.  No separate adnexal mass. Other:  No free fluid. Musculoskeletal: Concentric joint space loss of the hip joints with osteophyte formation, right worse than left. There is also osteophytes and sclerosis along the sacroiliac joints. Hypertrophic changes along the pubic symphysis. Global osteopenia. No obvious fracture. Partial sacralization left side of L5 with some bony fusion. However there are lytic destructive lesions identified along the right iliac bone towards the superior anterior iliac crest as well as along the margin  of the right acetabulum. Example series 2, image 19 measures 3.2 by 2.1 cm. This breaches the medial cortex of the bone. Right superior acetabular lesion on series 4, image 44 of the coronal data set measures 19 x 13  mm. There is also some disruption along the cortex of the bone at the level of the joint space. Please correlate for any history of known malignancy or further workup is recommended such as MRI or bone scan IMPRESSION: Moderate multifocal degenerative changes identified without obvious fracture or dislocation. Lytic destructive bone lesions involving the right hemipelvis are noted including superior acetabular region. Please correlate for any known history of malignancy otherwise further evaluation is recommended such as pelvic MRI with and without contrast and whole-body bone scan. Of note the lesions identified do have areas of cortex which are disrupted. Electronically Signed   By: Karen Kays M.D.   On: 03/04/2023 12:49   DG Hip Unilat W or Wo Pelvis 2-3 Views Right  Result Date: 03/04/2023 CLINICAL DATA:  Pain after injury EXAM: DG HIP (WITH OR WITHOUT PELVIS) 3V RIGHT COMPARISON:  None FINDINGS: Osteopenia. Global joint space loss of the sacroiliac joints and hip joints with hyperostosis. No dislocation. There is subtle linear lucency along the base of the femoral head on the right. A nondisplaced injury is difficult to exclude with the level of underlying osteopenia and recommend further workup with CT to confirm presence of injury. Several probable vascular calcifications in the pelvis. IMPRESSION: Osteopenia. There is subtle linear lucency along the base of the femoral head on the right. A nondisplaced injury is difficult to exclude with the level of underlying osteopenia and recommend further workup with CT to confirm presence of injury. Electronically Signed   By: Karen Kays M.D.   On: 03/04/2023 11:04    Assessment and plan- Patient is a 89 y.o. female with remote history of breast  and bladder cancer.  She recently had colon cancer about 3 years ago.  Patient presenting with pain during ambulation and found to have multiple bony lesions involving the pelvis  I have discussed MRI pelvis findings with the patient and her daughter in detail.  Usually bone metastatic disease is most commonly a finding with metastatic breast cancer although colon cancer can also present similarly.  I would recommend getting a CT chest abdomen with contrast to see if there is evidence of primary elsewhere.  Patient has a palpable right breast mass which she says she has had it for years.  Discussed with patient and her daughter that next step would be biopsy of the most easily accessible site.  Especially if this turns out to be metastatic breast cancer which is hormone positive this can be treated with endocrine therapy with tablets which can offer a good quality of life and longevity.  I would also recommend consideration for palliative radiation given her ongoing pain and the size of the right iliac mass as well as associated fracture.  I did have ER touch base with orthopedics and there is no role for emergent surgical fixation and given that there is no hip fracture or diastases pelvis, there would be role for any acute surgical management.  Moreover patient does not wish to opt for any surgery.  I will be calling the patient on Monday with the results of the CT scan and see if the patient wants to pursue biopsy for tissue diagnosis with or without palliative radiation.  I am also getting myeloma workup while inpatient although the likelihood of myeloma is low   Thank you for this kind referral and the opportunity to participate in the care of this patient   Visit Diagnosis 1. Lytic bone lesion of hip   2. Right hip pain  Dr. Owens Shark, MD, MPH [facility] [phone] 03/05/2023

## 2023-03-07 ENCOUNTER — Other Ambulatory Visit: Payer: Self-pay | Admitting: *Deleted

## 2023-03-07 ENCOUNTER — Telehealth: Payer: Self-pay

## 2023-03-07 ENCOUNTER — Telehealth: Payer: Self-pay | Admitting: *Deleted

## 2023-03-07 DIAGNOSIS — M899 Disorder of bone, unspecified: Secondary | ICD-10-CM

## 2023-03-07 NOTE — Telephone Encounter (Signed)
patients son, Laban Emperor, is calling. He states that his mother is now agreeable to have the breast biopsy done and would like to move forward with scheduling. He also had the questions of "is this terminal?" and "what is the timeline?" His call back # [phone]

## 2023-03-07 NOTE — Telephone Encounter (Signed)
Make a letter to request that pt needs to come off for a biopsy of right iliac crest. I called cardiologist and I was told that the next appt for the pt. Next is [facility]. Staff member said to fax the letter for request to [phone]. I sent the letter to them.

## 2023-03-07 NOTE — Telephone Encounter (Signed)
patients daughter called and said that patient is to receive a call from Dr Smith Robert today. Patient asks that you call her daughter Darl Pikes) @ [phone]

## 2023-03-07 NOTE — Progress Notes (Signed)
Letter for clearance to come off coumadin for a bx

## 2023-03-08 ENCOUNTER — Other Ambulatory Visit: Payer: Self-pay | Admitting: *Deleted

## 2023-03-08 ENCOUNTER — Other Ambulatory Visit: Payer: Self-pay | Admitting: Oncology

## 2023-03-08 DIAGNOSIS — M899 Disorder of bone, unspecified: Secondary | ICD-10-CM

## 2023-03-09 ENCOUNTER — Telehealth: Payer: Self-pay | Admitting: *Deleted

## 2023-03-09 NOTE — Telephone Encounter (Signed)
I called the patient's mobile today to let her know that her hemoglobin is okay but her iron studies were down and Dr. Smith Robert wanted to see if she would get an IV iron infusion if that is what she wants.  Patient says it is fine with her and I gave her the appointment as of August 21 at 115 August 21 at 1:15. Patient is okay with that appointment time and will come in

## 2023-03-10 ENCOUNTER — Telehealth: Payer: Self-pay | Admitting: *Deleted

## 2023-03-10 ENCOUNTER — Telehealth: Payer: Self-pay

## 2023-03-10 NOTE — Telephone Encounter (Signed)
Transition Care Management Follow-up Telephone Call Date of discharge and from where: [place] 8/9 How have you been since you were released from the hospital? Doing ok, PT has been diagnosed with stage 4 cancer Any questions or concerns? No  Items Reviewed: Did the pt receive and understand the discharge instructions provided? Yes  Medications obtained and verified? Yes  Other? No  Any new allergies since your discharge? No  Dietary orders reviewed? No Do you have support at home? Yes     Follow up appointments reviewed:  PCP Hospital f/u appt confirmed? Yes  Scheduled to see  on  @ . Specialist Hospital f/u appt confirmed? Yes  Scheduled to see  on  @ . Are transportation arrangements needed? No  If their condition worsens, is the pt aware to call PCP or go to the Emergency Dept.? Yes Was the patient provided with contact information for the PCP's office or ED? Yes Was to pt encouraged to call back with questions or concerns? Yes

## 2023-03-10 NOTE — Telephone Encounter (Signed)
I spoke to the daughter this morning and told her that I am waiting to hear from cardiology due to the bx is going to be 8/22. I was told that Marchelle Folks will call me back with the info about lovenox, coumadin. I called back later in the day and was told that pt .                                                                                                                  8/18 no coumadin and start lovenox am and PM, 8/19 no coumadin and start lovenox am and PM 8/20 no coumadin and start lovenox am and PM 8/21 no coumadin and start lovenox am and PM  8/22 lovenox hold for am, after the biopsy then that evening you can start back with coumadin  The next INR is 8/26 9:30 in [place]. Daughter knows all about this

## 2023-03-14 ENCOUNTER — Other Ambulatory Visit: Payer: Self-pay | Admitting: *Deleted

## 2023-03-14 ENCOUNTER — Telehealth: Payer: Self-pay | Admitting: *Deleted

## 2023-03-14 NOTE — Telephone Encounter (Signed)
I spoke to her daughter this am to make sure she knows that the bx is going to be 8/22 arrival at 9:30, nothing to eat or drink 8 hours prior and needs a driver to and back home. The bx will start at 10:30. She is aware.

## 2023-03-15 ENCOUNTER — Encounter: Payer: Self-pay | Admitting: Radiation Oncology

## 2023-03-15 ENCOUNTER — Other Ambulatory Visit: Payer: Self-pay | Admitting: Radiology

## 2023-03-15 ENCOUNTER — Ambulatory Visit
Admission: RE | Admit: 2023-03-15 | Discharge: 2023-03-15 | Disposition: A | Discharge status: Home / Self Care | Payer: Medicare PPO | Source: Ambulatory Visit | Attending: Radiation Oncology | Admitting: Radiation Oncology

## 2023-03-15 VITALS — BP 122/75 | HR 66

## 2023-03-15 DIAGNOSIS — Z853 Personal history of malignant neoplasm of breast: Secondary | ICD-10-CM | POA: Insufficient documentation

## 2023-03-15 DIAGNOSIS — M899 Disorder of bone, unspecified: Secondary | ICD-10-CM | POA: Diagnosis not present

## 2023-03-15 DIAGNOSIS — Z85038 Personal history of other malignant neoplasm of large intestine: Secondary | ICD-10-CM | POA: Insufficient documentation

## 2023-03-15 DIAGNOSIS — Z923 Personal history of irradiation: Secondary | ICD-10-CM | POA: Insufficient documentation

## 2023-03-15 DIAGNOSIS — C7951 Secondary malignant neoplasm of bone: Secondary | ICD-10-CM | POA: Insufficient documentation

## 2023-03-15 DIAGNOSIS — Z8551 Personal history of malignant neoplasm of bladder: Secondary | ICD-10-CM | POA: Diagnosis not present

## 2023-03-15 DIAGNOSIS — C801 Malignant (primary) neoplasm, unspecified: Secondary | ICD-10-CM | POA: Diagnosis not present

## 2023-03-15 DIAGNOSIS — Z01812 Encounter for preprocedural laboratory examination: Secondary | ICD-10-CM

## 2023-03-15 NOTE — Consult Note (Signed)
NEW PATIENT EVALUATION  Name: Gabriela [name]  MRN: [id number]  Date:   03/15/2023     DOB: [birth year]   This 89 y.o. female patient presents to the clinic for initial evaluation of palliative radiation therapy to her right pelvis and patient with as yet undiagnosed widespread metastatic disease from unknown primary.  REFERRING PHYSICIAN: Marina Goodell, MD  CHIEF COMPLAINT:  Chief Complaint  Patient presents with   Consult    Bone Lesion    DIAGNOSIS: The encounter diagnosis was Bone lesion.   PREVIOUS INVESTIGATIONS:  PET CT scan ordered CT scans reviewed Clinical notes reviewed Pathology reports pending  HPI: Patient is an 89 year old female who I treated over 20 years ago for breast cancer.  She also history of bladder cancer as well as colon cancer T3 N0 disease 3 years prior.  She is presenting with right sided pelvic pain over the last several months.  CT scan of her pelvis showed a large lytic lesion in the right iliac bone suggestive of metastatic disease.  MRI of her pelvis shows multiple marrow replacing bone lesions in the pelvis the largest being the right iliac wing up to 6 cm also has lesions in the right acetabulum right femoral head sacrum and left acetabulum.  She is scheduled for a biopsy of her right iliac wing tomorrow.  I have also ordered a PET CT scan  PLANNED TREATMENT REGIMEN: Palliative radiation therapy to her right pelvis  PAST MEDICAL HISTORY:  has a past medical history of Acid reflux, Anemia, Anxiety, Atrial fibrillation (HCC), Bladder cancer (HCC), Breast cancer (HCC), Cancer (HCC), Depression, Dysrhythmia, GERD (gastroesophageal reflux disease), History of hiatal hernia, HOH (hard of hearing), Hypertension, Hyperthyroidism, Hypothyroidism, Large bowel obstruction (HCC) (04/08/2021), Stroke (HCC), and TIA (transient ischemic attack).    PAST SURGICAL HISTORY:  Past Surgical History:  Procedure Laterality Date   ABDOMINAL HYSTERECTOMY     ANKLE  ARTHODESIS W/ ARTHROSCOPY     BLADDER SURGERY     CATARACT EXTRACTION W/PHACO Left 12/02/2014   Procedure: CATARACT EXTRACTION PHACO AND INTRAOCULAR LENS PLACEMENT (IOC);  Surgeon: Sallee Lange, MD;  Location: [facility] OR[facility];  Service: Ophthalmology;  Laterality: Left;  US01:45 AP%25.7 CDE42.99   CHOLECYSTECTOMY     EYE SURGERY     cataract   ILEOSTOMY CLOSURE N/A 04/15/2021   Procedure: ILEOSTOMY TAKEDOWN REVISION;  Surgeon: Campbell Lerner, MD;  Location: [facility] OR[facility];  Service: General;  Laterality: N/A;  not a takedown, just a revision...   INSERTION OF SUPRAPUBIC CATHETER  04/06/2021   Procedure: INSERTION OF FOLEY CATHETER;  Surgeon: Sondra Come, MD;  Location: [facility] OR[facility];  Service: Urology;;   JOINT REPLACEMENT     bil tkr   MASTECTOMY     partial right   REPLACEMENT TOTAL KNEE      FAMILY HISTORY: family history is not on file. She was adopted.  SOCIAL HISTORY:  reports that she has never smoked. She has never used smokeless tobacco. She reports that she does not drink alcohol and does not use drugs.  ALLERGIES: Celebrex [celecoxib], Morphine and codeine, Povidone-iodine, and Sulfa antibiotics  MEDICATIONS:  Current Outpatient Medications  Medication Sig Dispense Refill   acetaminophen (TYLENOL) 325 MG tablet Take 2 tablets (650 mg total) by mouth every 6 (six) hours as needed for mild pain or fever.     ALPRAZolam (XANAX) 0.25 MG tablet Take 0.5 tablets (0.125 mg total) by mouth at bedtime as needed for anxiety. 4 tablet 0   amLODipine (NORVASC)  5 MG tablet Take by mouth.     feeding supplement (ENSURE ENLIVE / ENSURE PLUS) LIQD Take 237 mLs by mouth 3 (three) times daily between meals. 237 mL 12   iron polysaccharides (NIFEREX) 150 MG capsule Take 1 capsule (150 mg total) by mouth daily. 90 capsule 0   levothyroxine (SYNTHROID, LEVOTHROID) 112 MCG tablet Take 112 mcg by mouth daily before breakfast.     sertraline (ZOLOFT) 50 MG tablet Take 50 mg by mouth daily. hs      warfarin (COUMADIN) 5 MG tablet Take 1 tablet (5 mg total) by mouth daily. HOLD WARFARIN UNTIL INR Improves and Restarted at the Direction of Cardiology     No current facility-administered medications for this encounter.    ECOG PERFORMANCE STATUS:  1 - Symptomatic but completely ambulatory  REVIEW OF SYSTEMS: Patient has history of breast cancer bladder cancer as well as colon cancer. Patient denies any weight loss, fatigue, weakness, fever, chills or night sweats. Patient denies any loss of vision, blurred vision. Patient denies any ringing  of the ears or hearing loss. No irregular heartbeat. Patient denies heart murmur or history of fainting. Patient denies any chest pain or pain radiating to her upper extremities. Patient denies any shortness of breath, difficulty breathing at night, cough or hemoptysis. Patient denies any swelling in the lower legs. Patient denies any nausea vomiting, vomiting of blood, or coffee ground material in the vomitus. Patient denies any stomach pain. Patient states has had normal bowel movements no significant constipation or diarrhea. Patient denies any dysuria, hematuria or significant nocturia. Patient denies any problems walking, swelling in the joints or loss of balance. Patient denies any skin changes, loss of hair or loss of weight. Patient denies any excessive worrying or anxiety or significant depression. Patient denies any problems with insomnia. Patient denies excessive thirst, polyuria, polydipsia. Patient denies any swollen glands, patient denies easy bruising or easy bleeding. Patient denies any recent infections, allergies or URI. Patient "s visual fields have not changed significantly in recent time.   PHYSICAL EXAM: BP 122/75   Pulse 66   LMP  (LMP Unknown)  Frail-appearing elderly female wheelchair-bound in NAD.  Range of motion of her right lower extremity does elicit some pain.  No focal neurologic deficits are noted in the upper and lower  extremities.  Deep palpation of her spine does not elicit pain.  Well-developed well-nourished patient in NAD. HEENT reveals PERLA, EOMI, discs not visualized.  Oral cavity is clear. No oral mucosal lesions are identified. Neck is clear without evidence of cervical or supraclavicular adenopathy. Lungs are clear to A&P. Cardiac examination is essentially unremarkable with regular rate and rhythm without murmur rub or thrill. Abdomen is benign with no organomegaly or masses noted. Motor sensory and DTR levels are equal and symmetric in the upper and lower extremities. Cranial nerves II through XII are grossly intact. Proprioception is intact. No peripheral adenopathy or edema is identified. No motor or sensory levels are noted. Crude visual fields are within normal range.  LABORATORY DATA: Pathology pending    RADIOLOGY RESULTS: PET CT scan ordered   IMPRESSION: Widespread metastatic disease of yet unknown origin in 89 year old female with multiple cancer histories  PLAN: At this time of ordered a PET scan to better delineate areas of metastatic disease so I Compass is much as possible in my palliative radiation fields.  Based on his results would probably offer at least palliative radiation therapy to her right pelvis 30 Gray in 10  fractions.  Will review both her biopsy's PET scan when the become available.  Risks and benefits of treatment including skin reaction fatigue possible diarrhea alteration blood counts all were discussed in detail with the patient and her family.  They all seem to comprehend my treatment plan and recommendations well.  I would like to take this opportunity to thank you for allowing me to participate in the care of your patient.Carmina Miller, MD

## 2023-03-15 NOTE — H&P (Signed)
Chief Complaint: Patient was seen in consultation today for right iliac lytic bone lesion at the request of Rao,Archana C  Referring Physician(s): Rao,Archana C  Supervising Physician: Irish Lack  Patient Status: [facility] - Out-pt  History of Present Illness: Gabriela COOKSLEY is a 89 y.o. female with PMHx significant for breast cancer, bladder cancer, colon cancer, atrial fibrillation, GERD, HTN, TIA. She presented to ED recently with difficulty and pain during walking and imaging revealed multiple lytic lesions involving the pelvis and right breast soft tissue mass. The patient has been seen by Oncology and request received for right iliac lytic bone lesion biopsy.   Past Medical History:  Diagnosis Date   Acid reflux    Anemia    Anxiety    Atrial fibrillation (HCC)    Bladder cancer (HCC)    Breast cancer (HCC)    Cancer (HCC)    breast,bladder   Depression    Dysrhythmia    afib   GERD (gastroesophageal reflux disease)    History of hiatal hernia    HOH (hard of hearing)    aids   Hypertension    Hyperthyroidism    Hypothyroidism    Large bowel obstruction (HCC) 04/08/2021   Stroke (HCC)    2010   TIA (transient ischemic attack)     Past Surgical History:  Procedure Laterality Date   ABDOMINAL HYSTERECTOMY     ANKLE ARTHODESIS W/ ARTHROSCOPY     BLADDER SURGERY     CATARACT EXTRACTION W/PHACO Left 12/02/2014   Procedure: CATARACT EXTRACTION PHACO AND INTRAOCULAR LENS PLACEMENT (IOC);  Surgeon: Sallee Lange, MD;  Location: [facility] ORS;  Service: Ophthalmology;  Laterality: Left;  US01:45 AP%25.7 CDE42.99   CHOLECYSTECTOMY     EYE SURGERY     cataract   ILEOSTOMY CLOSURE N/A 04/15/2021   Procedure: ILEOSTOMY TAKEDOWN REVISION;  Surgeon: Campbell Lerner, MD;  Location: [facility] ORS;  Service: General;  Laterality: N/A;  not a takedown, just a revision...   INSERTION OF SUPRAPUBIC CATHETER  04/06/2021   Procedure: INSERTION OF FOLEY CATHETER;  Surgeon: Sondra Come, MD;  Location: [facility] ORS;  Service: Urology;;   JOINT REPLACEMENT     bil tkr   MASTECTOMY     partial right   REPLACEMENT TOTAL KNEE      Allergies: Celebrex [celecoxib], Morphine and codeine, Povidone-iodine, and Sulfa antibiotics  Medications: Prior to Admission medications   Medication Sig Start Date End Date Taking? Authorizing Provider  acetaminophen (TYLENOL) 325 MG tablet Take 2 tablets (650 mg total) by mouth every 6 (six) hours as needed for mild pain or fever. 04/22/21   Danford, Earl Lites, MD  ALPRAZolam Prudy Feeler) 0.25 MG tablet Take 0.5 tablets (0.125 mg total) by mouth at bedtime as needed for anxiety. 04/22/21   Danford, Earl Lites, MD  amLODipine (NORVASC) 5 MG tablet Take by mouth. 09/28/22   [provider]  feeding supplement (ENSURE ENLIVE / ENSURE PLUS) LIQD Take 237 mLs by mouth 3 (three) times daily between meals. 04/22/21   Danford, Earl Lites, MD  iron polysaccharides (NIFEREX) 150 MG capsule Take 1 capsule (150 mg total) by mouth daily. 05/15/21   Marguerita Merles Latif, DO  levothyroxine (SYNTHROID, LEVOTHROID) 112 MCG tablet Take 112 mcg by mouth daily before breakfast.    [provider]  sertraline (ZOLOFT) 50 MG tablet Take 50 mg by mouth daily. hs    [provider]  warfarin (COUMADIN) 5 MG tablet Take 1 tablet (5 mg total)  by mouth daily. HOLD WARFARIN UNTIL INR Improves and Restarted at the Direction of Cardiology 05/21/21   Merlene Laughter, DO     Family History  Adopted: Yes    Social History   Socioeconomic History   Marital status: Widowed    Spouse name: Not on file   Number of children: Not on file   Years of education: Not on file   Highest education level: Not on file  Occupational History   Not on file  Tobacco Use   Smoking status: Never   Smokeless tobacco: Never  Substance and Sexual Activity   Alcohol use: No   Drug use: Never   Sexual activity: Not on file  Other Topics Concern   Not  on file  Social History Narrative   Not on file   Social Determinants of Health   Financial Resource Strain: Not on file  Food Insecurity: Not on file  Transportation Needs: Not on file  Physical Activity: Not on file  Stress: Not on file  Social Connections: Not on file    Review of Systems: A 12 point ROS discussed and pertinent positives are indicated in the HPI above.  All other systems are negative.  Review of Systems  Vital Signs: LMP  (LMP Unknown)   Physical Exam  Imaging: CT ABDOMEN W CONTRAST  Result Date: 03/04/2023 CLINICAL DATA:  Skeletal metastatic disease seen in the pelvis EXAM: CT ABDOMEN WITH CONTRAST TECHNIQUE: Multidetector CT imaging of the abdomen was performed using the standard protocol following bolus administration of intravenous contrast. RADIATION DOSE REDUCTION: This exam was performed according to the departmental dose-optimization program which includes automated exposure control, adjustment of the mA and/or kV according to patient size and/or use of iterative reconstruction technique. CONTRAST:  80mL OMNIPAQUE IOHEXOL 300 MG/ML  SOLN COMPARISON:  None Available. FINDINGS: Lower chest: Described in the CT chest. Hepatobiliary: No focal abnormalities are seen in liver. Surgical clips are seen in gallbladder fossa. There is mild dilation of intrahepatic bile ducts. Pancreas: Unremarkable. Spleen: Unremarkable. Adrenals/Urinary Tract: Adrenals are unremarkable. There is no hydronephrosis in the right kidney. There is mild left hydronephrosis. There are no renal stones. There is no significant dilation of visualized portions of ureters. There are multiple smoothly marginated low-density lesions in both kidneys largest measuring 5.6 cm suggestion bilateral renal cysts. Stomach/Bowel: Large hiatal hernia is seen. A small amount of serous fluid in the hiatal hernia sac. There is evidence of previous subtotal colectomy with ileostomy in right mid abdomen. There is  herniation of small bowel loops adjacent to the ileostomy. Diverticula are seen in visualized portions of sigmoid colon. Vascular/Lymphatic: Arterial calcifications are seen in aorta and its major branches. Other: There is no ascites or pneumoperitoneum in the abdomen. Musculoskeletal: There is a large lytic lesion in the right iliac bone. This finding is fully described in the CT pelvis. Dextroscoliosis is seen in lumbar spine. Degenerative changes are noted at multiple levels with encroachment of neural foramina. IMPRESSION: There is no evidence of intestinal obstruction or pneumoperitoneum. Mild left hydronephrosis, possibly suggesting ureteropelvic junction obstruction. No focal abnormalities are seen in liver and adrenals. Large hiatal hernia. Bilateral renal cysts. Diverticulosis in sigmoid colon. Aortic arteriosclerosis. Lumbar spondylosis. There is a large lytic lesion in right iliac bone suggesting metastatic disease. Ileostomy is noted in right mid abdomen. Para ostial hernia containing small bowel loops without signs of obstruction. Electronically Signed   By: Ernie Avena M.D.   On: 03/04/2023 18:57   CT  Chest W Contrast  Result Date: 03/04/2023 CLINICAL DATA:  Skeletal metastatic disease, evaluate for primary malignancy EXAM: CT CHEST WITH CONTRAST TECHNIQUE: Multidetector CT imaging of the chest was performed during intravenous contrast administration. RADIATION DOSE REDUCTION: This exam was performed according to the departmental dose-optimization program which includes automated exposure control, adjustment of the mA and/or kV according to patient size and/or use of iterative reconstruction technique. CONTRAST:  80mL OMNIPAQUE IOHEXOL 300 MG/ML  SOLN COMPARISON:  04/21/2007 FINDINGS: Cardiovascular: Heart is enlarged in size. There is small to moderate pericardial effusion. There is homogeneous enhancement in thoracic aorta. There are no intraluminal filling defects in central pulmonary  artery branches. Coronary artery calcifications are seen. Mediastinum/Nodes: Pericardial recess is noted adjacent to the ascending thoracic aorta. No significant lymphadenopathy is seen. Lungs/Pleura: There are linear densities in both lower lung fields, more so on the right side suggesting subsegmental atelectasis. No discrete lung nodules are seen. There is no pleural effusion or pneumothorax. There is large hiatal hernia under the right hemidiaphragm. There is small amount of fluid within the hernial sac. Upper Abdomen: Will be evaluated in the CT abdomen. Musculoskeletal: There is 2.9 x 3.4 cm lobulated soft tissue mass in the medial right breast. There is skin thickening adjacent to this mass. This lesion may suggest primary malignant neoplasm in the right breast. There is decrease in height of body of T1 vertebra without definite break in the cortical margins. There is a decrease in height of upper endplate of body of T7 vertebra with sclerosis. Possibility of recent compression fracture is not excluded. Degenerative changes are noted at thoracolumbar junction at T12-L1 level with minimal retrolisthesis and bony spurs. No definite focal lytic lesions are seen. IMPRESSION: There is 3.4 cm lobulated soft tissue mass in the medial aspect of right breast suggesting possible primary malignant neoplasm in the right breast. There is 20-30% decrease in height of upper endplate of body of T7 vertebra. This may suggest traumatic fracture or pathological fracture. As far as seen, no definite focal lytic lesions are seen in the bony structures in thorax. Cardiomegaly. Small to moderate pericardial effusion. Coronary artery disease. Large hiatal hernia. Small amount of fluid is seen in the hernial sac in the hiatal hernia. Linear patchy densities are seen in both lower lung fields, more so on the right side suggesting scarring and subsegmental atelectasis. Electronically Signed   By: Ernie Avena M.D.   On:  03/04/2023 18:41   MR PELVIS W WO CONTRAST  Result Date: 03/04/2023 CLINICAL DATA:  Pelvic pain.  Lytic lesions seen on CT EXAM: MRI PELVIS WITHOUT AND WITH CONTRAST TECHNIQUE: Multiplanar multisequence MR imaging of the pelvis was performed both before and after administration of intravenous contrast. CONTRAST:  5mL GADAVIST GADOBUTROL 1 MMOL/ML IV SOLN COMPARISON:  CT 03/04/2023 FINDINGS: Bones/Joint/Cartilage Multiple marrow replacing bone lesions within the pelvis. Largest lesion within the right iliac wing measures 6.0 x 2.2 x 5.9 cm. This lesion is expansile with significant cortical thinning and pathologic fracture. Small amount of extraosseous soft tissue component which extends into the adjacent gluteus minimus muscle (series 10, image 12). Additional lesion within the superior right acetabulum measures up to 4.8 cm in size. This does appear to extend intra-articularly into the superior aspect of the right hip joint (series 11, image 23). Additional smaller lesions within the posterior right acetabulum and within the sacrum at S1 (the S1 segment is lumbarized). There is also a tiny enhancing lesions within the posterior aspect of the right  femoral head and posterior left acetabulum. Bony pelvis remains intact without diastasis. Bilateral hip joints are intact without fracture or dislocation. Degenerative changes of both hips, right worse than left. Ligaments Intact. Muscles and Tendons Partial tearing of the left gluteus minimus tendon, likely chronic. Soft tissues Mildly enlarged left inguinal lymph node measuring 1.1 cm. Extensive colonic diverticulosis. IMPRESSION: 1. Multiple marrow replacing bone lesions within the pelvis, largest within the right iliac wing measuring up to 6.0 cm in size. This lesion is expansile with significant cortical thinning and pathologic fracture. Small amount of extraosseous soft tissue component which extends into the adjacent gluteus minimus muscle. 2. Additional lesions  include the right acetabulum, right femoral head, sacrum, and left acetabulum. Appearance favors metastatic disease or myeloma. 3. Mildly enlarged left inguinal lymph node measuring 1.1 cm, nonspecific. 4. Partial tearing of the left gluteus minimus tendon, likely chronic. Electronically Signed   By: Duanne Guess D.O.   On: 03/04/2023 15:36   CT PELVIS WO CONTRAST  Result Date: 03/04/2023 CLINICAL DATA:  Pain after injury EXAM: CT PELVIS WITHOUT CONTRAST TECHNIQUE: Multidetector CT imaging of the pelvis was performed following the standard protocol without intravenous contrast. RADIATION DOSE REDUCTION: This exam was performed according to the departmental dose-optimization program which includes automated exposure control, adjustment of the mA and/or kV according to patient size and/or use of iterative reconstruction technique. COMPARISON:  None Available. FINDINGS: Urinary Tract:  Preserved contours of the urinary bladder. Bowel: Sigmoid colon diverticulosis. The visualized bowel is nondilated the pelvis. There is a right lower quadrant ostomy with a stomal hernia of small bowel. Please correlate with the history Vascular/Lymphatic: Mild scattered vascular calcifications are identified. No specific abnormal lymph node enlargement identified in the visualized pelvis. Reproductive:  Uterus is absent.  No separate adnexal mass. Other:  No free fluid. Musculoskeletal: Concentric joint space loss of the hip joints with osteophyte formation, right worse than left. There is also osteophytes and sclerosis along the sacroiliac joints. Hypertrophic changes along the pubic symphysis. Global osteopenia. No obvious fracture. Partial sacralization left side of L5 with some bony fusion. However there are lytic destructive lesions identified along the right iliac bone towards the superior anterior iliac crest as well as along the margin of the right acetabulum. Example series 2, image 19 measures 3.2 by 2.1 cm. This  breaches the medial cortex of the bone. Right superior acetabular lesion on series 4, image 44 of the coronal data set measures 19 x 13 mm. There is also some disruption along the cortex of the bone at the level of the joint space. Please correlate for any history of known malignancy or further workup is recommended such as MRI or bone scan IMPRESSION: Moderate multifocal degenerative changes identified without obvious fracture or dislocation. Lytic destructive bone lesions involving the right hemipelvis are noted including superior acetabular region. Please correlate for any known history of malignancy otherwise further evaluation is recommended such as pelvic MRI with and without contrast and whole-body bone scan. Of note the lesions identified do have areas of cortex which are disrupted. Electronically Signed   By: Karen Kays M.D.   On: 03/04/2023 12:49   DG Hip Unilat W or Wo Pelvis 2-3 Views Right  Result Date: 03/04/2023 CLINICAL DATA:  Pain after injury EXAM: DG HIP (WITH OR WITHOUT PELVIS) 3V RIGHT COMPARISON:  None FINDINGS: Osteopenia. Global joint space loss of the sacroiliac joints and hip joints with hyperostosis. No dislocation. There is subtle linear lucency along the base of  the femoral head on the right. A nondisplaced injury is difficult to exclude with the level of underlying osteopenia and recommend further workup with CT to confirm presence of injury. Several probable vascular calcifications in the pelvis. IMPRESSION: Osteopenia. There is subtle linear lucency along the base of the femoral head on the right. A nondisplaced injury is difficult to exclude with the level of underlying osteopenia and recommend further workup with CT to confirm presence of injury. Electronically Signed   By: Karen Kays M.D.   On: 03/04/2023 11:04    Labs:  CBC: No results for input(s): "WBC", "HGB", "HCT", "PLT" in the last 8760 hours.  COAGS: Recent Labs    03/04/23 1030  INR 3.5*     BMP: Recent Labs    03/04/23 1635  NA 140  K 4.0  CL 109  CO2 23  GLUCOSE 119*  BUN 16  CALCIUM 9.0  CREATININE 0.95  GFRNONAA 57*    Assessment and Plan: [place] a 89 year old female with PMHx significant for breast cancer, bladder cancer, colon cancer, atrial fibrillation, GERD, HTN, TIA. She presented to ED recently with difficulty and pain during walking and imaging revealed multiple lytic lesions involving the pelvis and right breast soft tissue mass. The patient has been seen by Oncology and request received for right iliac lytic bone lesion biopsy.   The patient has been NPO, no blood thinners taken, imaging, labs and vitals have been reviewed.  Risks and benefits of image guided right iliac lytic bone lesion biopsy with moderate sedation was discussed with the patient and/or patient's family including, but not limited to bleeding, infection, damage to adjacent structures or low yield requiring additional tests.  All of the questions were answered and there is agreement to proceed.  Consent signed and in chart.   Thank you for this interesting consult.  I greatly enjoyed meeting KAYTEE JENNIGES and look forward to participating in their care.  A copy of this report was sent to the requesting provider on this date.  Electronically Signed: Berneta Levins, PA-C 03/15/2023, 9:27 AM   I spent a total of 15 Minutes in face to face in clinical consultation, greater than 50% of which was counseling/coordinating care for right iliac lytic bone lesion.

## 2023-03-16 NOTE — Progress Notes (Signed)
Patient for CT Superficial Bone (RT Iliac) Superficial Biopsy on Thurs 03/17/2023, I called and spoke with the patient's daughter, Darl Pikes on the phone and gave pre-procedure instructions. Darl Pikes was made aware to have the patient here at 9:30a, last dose of Coumadin was Sat 8/17 with the pt being on a Lovenox Bridge and not taking the dose night before her procedure (Wed 03/16/23),  NPO after MN prior to procedure as well as driver post procedure/recovery/discharge. Darl Pikes stated understanding.  Called 03/16/2023

## 2023-03-17 ENCOUNTER — Other Ambulatory Visit: Payer: Self-pay | Admitting: Oncology

## 2023-03-17 ENCOUNTER — Ambulatory Visit
Admission: RE | Admit: 2023-03-17 | Discharge: 2023-03-17 | Disposition: A | Discharge status: Home / Self Care | Payer: Medicare PPO | Source: Ambulatory Visit | Attending: Oncology | Admitting: Oncology

## 2023-03-17 DIAGNOSIS — M899 Disorder of bone, unspecified: Secondary | ICD-10-CM

## 2023-03-17 DIAGNOSIS — M533 Sacrococcygeal disorders, not elsewhere classified: Secondary | ICD-10-CM | POA: Diagnosis not present

## 2023-03-17 DIAGNOSIS — Z8551 Personal history of malignant neoplasm of bladder: Secondary | ICD-10-CM | POA: Diagnosis not present

## 2023-03-17 DIAGNOSIS — Z8673 Personal history of transient ischemic attack (TIA), and cerebral infarction without residual deficits: Secondary | ICD-10-CM | POA: Insufficient documentation

## 2023-03-17 DIAGNOSIS — I4891 Unspecified atrial fibrillation: Secondary | ICD-10-CM | POA: Insufficient documentation

## 2023-03-17 DIAGNOSIS — Z01812 Encounter for preprocedural laboratory examination: Secondary | ICD-10-CM

## 2023-03-17 DIAGNOSIS — I119 Hypertensive heart disease without heart failure: Secondary | ICD-10-CM | POA: Diagnosis not present

## 2023-03-17 DIAGNOSIS — K219 Gastro-esophageal reflux disease without esophagitis: Secondary | ICD-10-CM | POA: Insufficient documentation

## 2023-03-17 DIAGNOSIS — Z853 Personal history of malignant neoplasm of breast: Secondary | ICD-10-CM | POA: Diagnosis not present

## 2023-03-17 DIAGNOSIS — C679 Malignant neoplasm of bladder, unspecified: Secondary | ICD-10-CM | POA: Diagnosis not present

## 2023-03-17 DIAGNOSIS — K5730 Diverticulosis of large intestine without perforation or abscess without bleeding: Secondary | ICD-10-CM | POA: Insufficient documentation

## 2023-03-17 DIAGNOSIS — Z85038 Personal history of other malignant neoplasm of large intestine: Secondary | ICD-10-CM | POA: Insufficient documentation

## 2023-03-17 DIAGNOSIS — C7951 Secondary malignant neoplasm of bone: Secondary | ICD-10-CM | POA: Diagnosis not present

## 2023-03-17 MED ORDER — LIDOCAINE HCL (PF) 1 % IJ SOLN
10.0000 mL | Freq: Once | INTRAMUSCULAR | Status: AC
  Administered 2023-03-17: 10 mL via INTRADERMAL
  Filled 2023-03-17: qty 10

## 2023-03-17 MED ORDER — MIDAZOLAM HCL 2 MG/2ML IJ SOLN
INTRAMUSCULAR | Status: AC | PRN
  Administered 2023-03-17: .5 mg via INTRAVENOUS

## 2023-03-17 MED ORDER — FENTANYL CITRATE (PF) 100 MCG/2ML IJ SOLN
INTRAMUSCULAR | Status: AC
  Filled 2023-03-17: qty 2

## 2023-03-17 MED ORDER — FENTANYL CITRATE (PF) 100 MCG/2ML IJ SOLN
INTRAMUSCULAR | Status: AC | PRN
  Administered 2023-03-17 (×2): 25 ug via INTRAVENOUS

## 2023-03-17 MED ORDER — SODIUM CHLORIDE 0.9 % IV SOLN
INTRAVENOUS | Status: DC
  Administered 2023-03-17: 1000 mL via INTRAVENOUS

## 2023-03-17 MED ORDER — MIDAZOLAM HCL 2 MG/2ML IJ SOLN
INTRAMUSCULAR | Status: AC
  Filled 2023-03-17: qty 2

## 2023-03-17 NOTE — Procedures (Signed)
Interventional Radiology Procedure Note  Procedure: CT Guided Biopsy of right iliac bone lesion  Complications: None  Estimated Blood Loss: < 10 mL  Findings: 11 G core biopsy of right anterior iliac bone lesion performed under CT guidance.  Two core samples obtained and sent to Pathology.  Jodi Marble. Fredia Sorrow, M.D Pager:  [phone]

## 2023-03-17 NOTE — Progress Notes (Signed)
Patient clinically stable post CT Right Ileac bone lesion biopsy per Dr Fredia Sorrow. Awake entire procedure, appearing comfortable, although Versed 0.5 mg along with Fentanyl 50 mcg IV given for procedure. Vitals stable pre and post procedure. Denies complaints at this time. Report given to Talbert Forest RN 13/specials.family at bedside with update given per Dr Fredia Sorrow.

## 2023-03-18 ENCOUNTER — Telehealth: Payer: Self-pay

## 2023-03-18 ENCOUNTER — Telehealth: Payer: Self-pay | Admitting: *Deleted

## 2023-03-18 NOTE — Telephone Encounter (Signed)
Called the daughter susan and I told her that [facility] says the pathology came back and she wanted for you and pt. To come and and go over pathology and we will go from there. They accepted an appt 8/27 at 9 am. Darl Pikes said that is good to come over

## 2023-03-18 NOTE — Progress Notes (Signed)
Sterling Big, MD sent to Gabriela [name] Approved - molecular testing is desired so target soft tissue component and ask pathology not to decalcify specimen if possible.  HKM

## 2023-03-18 NOTE — Telephone Encounter (Signed)
The patient's daughter Darl Pikes called today requesting to speak wit Dr Malachi Carl nurse, I called down stairs and spoke with lLori and gave her the contact number and name to call the daughter back [phone]

## 2023-03-21 DIAGNOSIS — C7951 Secondary malignant neoplasm of bone: Secondary | ICD-10-CM | POA: Diagnosis not present

## 2023-03-21 DIAGNOSIS — C801 Malignant (primary) neoplasm, unspecified: Secondary | ICD-10-CM | POA: Diagnosis not present

## 2023-03-22 ENCOUNTER — Other Ambulatory Visit: Payer: Self-pay | Admitting: *Deleted

## 2023-03-22 ENCOUNTER — Other Ambulatory Visit (HOSPITAL_COMMUNITY): Payer: Self-pay

## 2023-03-22 ENCOUNTER — Inpatient Hospital Stay: Payer: Medicare PPO

## 2023-03-22 ENCOUNTER — Other Ambulatory Visit: Payer: Medicare PPO

## 2023-03-22 ENCOUNTER — Telehealth: Payer: Self-pay

## 2023-03-22 ENCOUNTER — Encounter: Payer: Self-pay | Admitting: Oncology

## 2023-03-22 ENCOUNTER — Inpatient Hospital Stay: Payer: Medicare PPO | Attending: Oncology | Admitting: Oncology

## 2023-03-22 VITALS — BP 125/67 | HR 75 | Temp 96.2°F | Resp 18 | Ht 63.0 in | Wt 129.0 lb

## 2023-03-22 DIAGNOSIS — Z170 Estrogen receptor positive status [ER+]: Secondary | ICD-10-CM | POA: Diagnosis not present

## 2023-03-22 DIAGNOSIS — C7951 Secondary malignant neoplasm of bone: Secondary | ICD-10-CM | POA: Insufficient documentation

## 2023-03-22 DIAGNOSIS — Z8551 Personal history of malignant neoplasm of bladder: Secondary | ICD-10-CM | POA: Diagnosis not present

## 2023-03-22 DIAGNOSIS — Z7189 Other specified counseling: Secondary | ICD-10-CM

## 2023-03-22 DIAGNOSIS — Z90710 Acquired absence of both cervix and uterus: Secondary | ICD-10-CM | POA: Insufficient documentation

## 2023-03-22 DIAGNOSIS — Z85038 Personal history of other malignant neoplasm of large intestine: Secondary | ICD-10-CM | POA: Insufficient documentation

## 2023-03-22 DIAGNOSIS — Z9011 Acquired absence of right breast and nipple: Secondary | ICD-10-CM | POA: Diagnosis not present

## 2023-03-22 DIAGNOSIS — M8580 Other specified disorders of bone density and structure, unspecified site: Secondary | ICD-10-CM | POA: Diagnosis not present

## 2023-03-22 DIAGNOSIS — C50911 Malignant neoplasm of unspecified site of right female breast: Secondary | ICD-10-CM | POA: Insufficient documentation

## 2023-03-22 DIAGNOSIS — Z7901 Long term (current) use of anticoagulants: Secondary | ICD-10-CM | POA: Diagnosis not present

## 2023-03-22 DIAGNOSIS — G893 Neoplasm related pain (acute) (chronic): Secondary | ICD-10-CM | POA: Diagnosis not present

## 2023-03-22 LAB — CBC WITH DIFFERENTIAL/PLATELET
Abs Immature Granulocytes: 0.02 10*3/uL (ref 0.00–0.07)
Basophils Absolute: 0 10*3/uL (ref 0.0–0.1)
Basophils Relative: 0 %
Eosinophils Absolute: 0 10*3/uL (ref 0.0–0.5)
Eosinophils Relative: 1 %
HCT: 37.6 % (ref 36.0–46.0)
Hemoglobin: 12.3 g/dL (ref 12.0–15.0)
Immature Granulocytes: 0 %
Lymphocytes Relative: 17 %
Lymphs Abs: 1.1 10*3/uL (ref 0.7–4.0)
MCH: 31.6 pg (ref 26.0–34.0)
MCHC: 32.7 g/dL (ref 30.0–36.0)
MCV: 96.7 fL (ref 80.0–100.0)
Monocytes Absolute: 0.5 10*3/uL (ref 0.1–1.0)
Monocytes Relative: 8 %
Neutro Abs: 4.6 10*3/uL (ref 1.7–7.7)
Neutrophils Relative %: 74 %
Platelets: 235 10*3/uL (ref 150–400)
RBC: 3.89 MIL/uL (ref 3.87–5.11)
RDW: 13.5 % (ref 11.5–15.5)
WBC: 6.3 10*3/uL (ref 4.0–10.5)
nRBC: 0 % (ref 0.0–0.2)

## 2023-03-22 LAB — PROTIME-INR
INR: 1.5 — ABNORMAL HIGH (ref 0.8–1.2)
Prothrombin Time: 18 seconds — ABNORMAL HIGH (ref 11.4–15.2)

## 2023-03-22 LAB — COMPREHENSIVE METABOLIC PANEL
ALT: 19 U/L (ref 0–44)
AST: 20 U/L (ref 15–41)
Albumin: 4 g/dL (ref 3.5–5.0)
Alkaline Phosphatase: 52 U/L (ref 38–126)
Anion gap: 6 (ref 5–15)
BUN: 22 mg/dL (ref 8–23)
CO2: 21 mmol/L — ABNORMAL LOW (ref 22–32)
Calcium: 9.6 mg/dL (ref 8.9–10.3)
Chloride: 110 mmol/L (ref 98–111)
Creatinine, Ser: 0.96 mg/dL (ref 0.44–1.00)
GFR, Estimated: 57 mL/min — ABNORMAL LOW (ref >60)
Glucose, Bld: 108 mg/dL — ABNORMAL HIGH (ref 70–99)
Potassium: 3.9 mmol/L (ref 3.5–5.1)
Sodium: 137 mmol/L (ref 135–145)
Total Bilirubin: 0.4 mg/dL (ref 0.3–1.2)
Total Protein: 7.3 g/dL (ref 6.5–8.1)

## 2023-03-22 MED ORDER — OXYCODONE HCL 5 MG PO TABS: 5.0000 mg | ORAL_TABLET | Freq: Four times a day (QID) | ORAL | 0 refills | Status: DC | PRN

## 2023-03-22 MED ORDER — LETROZOLE 2.5 MG PO TABS: 2.5000 mg | ORAL_TABLET | Freq: Every day | ORAL | 3 refills | Status: AC

## 2023-03-22 NOTE — Progress Notes (Signed)
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  She had bladder cancer about 10 years ago.  She was diagnosed with colon cancer 3 years ago T3 N0 disease.  She was not seen by medical oncology at that time due to personal preference.  Details of her breast cancer are not known.  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  Patient lives alone and has been having pain during ambulation which has been getting significantly worse.  Previously she was able to ambulate without a walker or a cane but has been needing to use walker and presented to the ER in August 2024.  CT pelvis without contrast and MRI pelvis with and without contrast showed multiple marrow replacing bone lesions within the pelvis largest within the right iliac wing measuring 6 cm in size.  The lesion is expansile with significant cortical thickening and pathologic fracture.  Small amount of extraosseous soft tissue component which extends into the adjacent gluteus minimus muscle.  Additional lesions include the right acetabulum right femoral head sacrum and left acetabulum.  Appearance favors metastatic disease or myeloma.  Mildly enlarged left inguinal lymph node 1.1 cm nonspecific.   CT chest abdomen pelvis with contrast showed a 3.4 cm lobulated right breast mass.  20 to 30% decrease in the T7 vertebral body which may be traumatic versus pathologic fracture.  No evidence of visceral  metastases.  Patient was referred for palliative radiation.CT-guided bone biopsy showed metastatic poorly differentiated adenocarcinoma consistent with breast origin.  Cells positive for CK7, GATA3, ER 100%.  CK20 and CDX2 negative.  HER2 negative.  Interval history-patient reports pain with ambulation and has been mostly sedentary.  She was seen by radiation oncology and plan was to get PET scan prior to starting palliative radiation  ECOG PS- 2-3 Pain scale- 4   Review of systems- Review of Systems  Constitutional:  Positive for malaise/fatigue. Negative for chills, fever and weight loss.  HENT:  Negative for congestion, ear discharge and nosebleeds.   Eyes:  Negative for blurred vision.  Respiratory:  Negative for cough, hemoptysis, sputum production, shortness of breath and wheezing.   Cardiovascular:  Negative for chest pain, palpitations, orthopnea and claudication.  Gastrointestinal:  Negative for abdominal pain, blood in stool, constipation, diarrhea, heartburn, melena, nausea and vomiting.  Genitourinary:  Negative for dysuria, flank pain, frequency, hematuria and urgency.  Musculoskeletal:  Negative for back pain, joint pain and myalgias.       Right hip pain  Skin:  Negative for rash.  Neurological:  Negative for dizziness, tingling, focal weakness, seizures, weakness and headaches.  Endo/Heme/Allergies:  Does not bruise/bleed easily.  Psychiatric/Behavioral:  Negative for depression and suicidal ideas. The patient does not have insomnia.       Allergies  Allergen Reactions   Celebrex [Celecoxib] Anaphylaxis   Morphine And Codeine Swelling    Lips and mouth   Povidone-Iodine Other (See Comments)    Other reaction(s): ITCHING Other reaction(s): ITCHING    Sulfa Antibiotics  Past Medical History:  Diagnosis Date   Acid reflux    Anemia    Anxiety    Atrial fibrillation (HCC)    Bladder cancer (HCC)    Breast cancer (HCC)    Cancer (HCC)    breast,bladder    Depression    Dysrhythmia    afib   GERD (gastroesophageal reflux disease)    History of hiatal hernia    HOH (hard of hearing)    aids   Hypertension    Hyperthyroidism    Hypothyroidism    Large bowel obstruction (HCC) 04/08/2021   Stroke (HCC)    2010   TIA (transient ischemic attack)      Past Surgical History:  Procedure Laterality Date   ABDOMINAL HYSTERECTOMY     ANKLE ARTHODESIS W/ ARTHROSCOPY     BLADDER SURGERY     CATARACT EXTRACTION W/PHACO Left 12/02/2014   Procedure: CATARACT EXTRACTION PHACO AND INTRAOCULAR LENS PLACEMENT (IOC);  Surgeon: Sallee Lange, MD;  Location: [facility] ORS;  Service: Ophthalmology;  Laterality: Left;  US01:45 AP%25.7 CDE42.99   CHOLECYSTECTOMY     EYE SURGERY     cataract   ILEOSTOMY CLOSURE N/A 04/15/2021   Procedure: ILEOSTOMY TAKEDOWN REVISION;  Surgeon: Campbell Lerner, MD;  Location: [facility] ORS;  Service: General;  Laterality: N/A;  not a takedown, just a revision...   INSERTION OF SUPRAPUBIC CATHETER  04/06/2021   Procedure: INSERTION OF FOLEY CATHETER;  Surgeon: Sondra Come, MD;  Location: [facility] ORS;  Service: Urology;;   JOINT REPLACEMENT     bil tkr   MASTECTOMY     partial right   REPLACEMENT TOTAL KNEE      Social History   Socioeconomic History   Marital status: Widowed    Spouse name: Not on file   Number of children: Not on file   Years of education: Not on file   Highest education level: Not on file  Occupational History   Not on file  Tobacco Use   Smoking status: Never   Smokeless tobacco: Never  Vaping Use   Vaping status: Never Used  Substance and Sexual Activity   Alcohol use: No   Drug use: Never   Sexual activity: Not on file  Other Topics Concern   Not on file  Social History Narrative   Not on file   Social Determinants of Health   Financial Resource Strain: Not on file  Food Insecurity: Not on file  Transportation Needs: Not on file  Physical Activity: Not on file  Stress: Not on file   Social Connections: Not on file  Intimate Partner Violence: Not on file    Family History  Adopted: Yes     Current Outpatient Medications:    acetaminophen (TYLENOL) 325 MG tablet, Take 2 tablets (650 mg total) by mouth every 6 (six) hours as needed for mild pain or fever., Disp: , Rfl:    ALPRAZolam (XANAX) 0.25 MG tablet, Take 0.5 tablets (0.125 mg total) by mouth at bedtime as needed for anxiety., Disp: 4 tablet, Rfl: 0   amLODipine (NORVASC) 5 MG tablet, Take by mouth., Disp: , Rfl:    feeding supplement (ENSURE ENLIVE / ENSURE PLUS) LIQD, Take 237 mLs by mouth 3 (three) times daily between meals., Disp: 237 mL, Rfl: 12   iron polysaccharides (NIFEREX) 150 MG capsule, Take 1 capsule (150 mg total) by mouth daily. (Patient not taking: Reported on 03/17/2023), Disp: 90 capsule, Rfl: 0   levothyroxine (SYNTHROID, LEVOTHROID) 112 MCG tablet,  Take 112 mcg by mouth daily before breakfast., Disp: , Rfl:    sertraline (ZOLOFT) 50 MG tablet, Take 50 mg by mouth daily. hs, Disp: , Rfl:    warfarin (COUMADIN) 5 MG tablet, Take 1 tablet (5 mg total) by mouth daily. HOLD WARFARIN UNTIL INR Improves and Restarted at the Direction of Cardiology, Disp: , Rfl:   Physical exam: There were no vitals filed for this visit. Physical Exam Constitutional:      Comments: Sitting in a wheelchair.  Appears in no acute distress  Cardiovascular:     Rate and Rhythm: Normal rate and regular rhythm.     Heart sounds: Normal heart sounds.  Pulmonary:     Effort: Pulmonary effort is normal.     Breath sounds: Normal breath sounds.  Abdominal:     General: Bowel sounds are normal.     Palpations: Abdomen is soft.  Skin:    General: Skin is warm and dry.  Neurological:     Mental Status: She is alert and oriented to person, place, and time.         Latest Ref Rng & Units 03/04/2023    4:35 PM  CMP  Glucose 70 - 99 mg/dL 914   BUN 8 - 23 mg/dL 16   Creatinine 7.82 - 1.00 mg/dL 9.56   Sodium 213 - 086  mmol/L 140   Potassium 3.5 - 5.1 mmol/L 4.0   Chloride 98 - 111 mmol/L 109   CO2 22 - 32 mmol/L 23   Calcium 8.9 - 10.3 mg/dL 9.0       Latest Ref Rng & Units 05/14/2021    5:20 AM  CBC  WBC 4.0 - 10.5 K/uL 7.5   Hemoglobin 12.0 - 15.0 g/dL 7.9   Hematocrit 57.8 - 46.0 % 25.1   Platelets 150 - 400 K/uL 433     No images are attached to the encounter.  CT BONE TROCAR/NEEDLE BIOPSY DEEP  Result Date: 03/17/2023 CLINICAL DATA:  History of colon, bladder and breast carcinoma with lytic lesions of the pelvis. EXAM: CT GUIDED CORE BIOPSY OF RIGHT ILIAC BONE ANESTHESIA/SEDATION: Moderate (conscious) sedation was employed during this procedure. A total of Versed 0.5 mg and Fentanyl 50 mcg was administered intravenously by radiology nursing. Moderate Sedation Time: 18 minutes. The patient's level of consciousness and vital signs were monitored continuously by radiology nursing throughout the procedure under my direct supervision. PROCEDURE: The procedure risks, benefits, and alternatives were explained to the patient. Questions regarding the procedure were encouraged and answered. The patient understands and consents to the procedure. A time-out was performed prior to initiating the procedure. The anterior right pelvic region was prepped with chlorhexidine in a sterile fashion, and a sterile drape was applied covering the operative field. A sterile gown and sterile gloves were used for the procedure. Local anesthesia was provided with 1% Lidocaine. An 11 gauge OnControl bone cutting needle was advanced from an anterior approach into the anterior right iliac bone at the level of a lytic lesion. After confirming needle tip position, a coaxial core biopsy sample was obtained. Two separate core biopsy samples were obtained and submitted in formalin. RADIATION DOSE REDUCTION: This exam was performed according to the departmental dose-optimization program which includes automated exposure control, adjustment of  the mA and/or kV according to patient size and/or use of iterative reconstruction technique. COMPLICATIONS: None FINDINGS: Lytic and expansile lesion of the anterior aspect of the right iliac bone measures approximately 2.2 x 3.4 cm on  axial imaging. Core biopsy yielded solid tissue and clot. IMPRESSION: CT-guided core biopsy performed of a lytic lesion within the anterior right iliac bone. Electronically Signed   By: Irish Lack M.D.   On: 03/17/2023 15:49   CT ABDOMEN W CONTRAST  Result Date: 03/04/2023 CLINICAL DATA:  Skeletal metastatic disease seen in the pelvis EXAM: CT ABDOMEN WITH CONTRAST TECHNIQUE: Multidetector CT imaging of the abdomen was performed using the standard protocol following bolus administration of intravenous contrast. RADIATION DOSE REDUCTION: This exam was performed according to the departmental dose-optimization program which includes automated exposure control, adjustment of the mA and/or kV according to patient size and/or use of iterative reconstruction technique. CONTRAST:  80mL OMNIPAQUE IOHEXOL 300 MG/ML  SOLN COMPARISON:  None Available. FINDINGS: Lower chest: Described in the CT chest. Hepatobiliary: No focal abnormalities are seen in liver. Surgical clips are seen in gallbladder fossa. There is mild dilation of intrahepatic bile ducts. Pancreas: Unremarkable. Spleen: Unremarkable. Adrenals/Urinary Tract: Adrenals are unremarkable. There is no hydronephrosis in the right kidney. There is mild left hydronephrosis. There are no renal stones. There is no significant dilation of visualized portions of ureters. There are multiple smoothly marginated low-density lesions in both kidneys largest measuring 5.6 cm suggestion bilateral renal cysts. Stomach/Bowel: Large hiatal hernia is seen. A small amount of serous fluid in the hiatal hernia sac. There is evidence of previous subtotal colectomy with ileostomy in right mid abdomen. There is herniation of small bowel loops adjacent to  the ileostomy. Diverticula are seen in visualized portions of sigmoid colon. Vascular/Lymphatic: Arterial calcifications are seen in aorta and its major branches. Other: There is no ascites or pneumoperitoneum in the abdomen. Musculoskeletal: There is a large lytic lesion in the right iliac bone. This finding is fully described in the CT pelvis. Dextroscoliosis is seen in lumbar spine. Degenerative changes are noted at multiple levels with encroachment of neural foramina. IMPRESSION: There is no evidence of intestinal obstruction or pneumoperitoneum. Mild left hydronephrosis, possibly suggesting ureteropelvic junction obstruction. No focal abnormalities are seen in liver and adrenals. Large hiatal hernia. Bilateral renal cysts. Diverticulosis in sigmoid colon. Aortic arteriosclerosis. Lumbar spondylosis. There is a large lytic lesion in right iliac bone suggesting metastatic disease. Ileostomy is noted in right mid abdomen. Para ostial hernia containing small bowel loops without signs of obstruction. Electronically Signed   By: Ernie Avena M.D.   On: 03/04/2023 18:57   CT Chest W Contrast  Result Date: 03/04/2023 CLINICAL DATA:  Skeletal metastatic disease, evaluate for primary malignancy EXAM: CT CHEST WITH CONTRAST TECHNIQUE: Multidetector CT imaging of the chest was performed during intravenous contrast administration. RADIATION DOSE REDUCTION: This exam was performed according to the departmental dose-optimization program which includes automated exposure control, adjustment of the mA and/or kV according to patient size and/or use of iterative reconstruction technique. CONTRAST:  80mL OMNIPAQUE IOHEXOL 300 MG/ML  SOLN COMPARISON:  04/21/2007 FINDINGS: Cardiovascular: Heart is enlarged in size. There is small to moderate pericardial effusion. There is homogeneous enhancement in thoracic aorta. There are no intraluminal filling defects in central pulmonary artery branches. Coronary artery calcifications  are seen. Mediastinum/Nodes: Pericardial recess is noted adjacent to the ascending thoracic aorta. No significant lymphadenopathy is seen. Lungs/Pleura: There are linear densities in both lower lung fields, more so on the right side suggesting subsegmental atelectasis. No discrete lung nodules are seen. There is no pleural effusion or pneumothorax. There is large hiatal hernia under the right hemidiaphragm. There is small amount of fluid within the hernial  sac. Upper Abdomen: Will be evaluated in the CT abdomen. Musculoskeletal: There is 2.9 x 3.4 cm lobulated soft tissue mass in the medial right breast. There is skin thickening adjacent to this mass. This lesion may suggest primary malignant neoplasm in the right breast. There is decrease in height of body of T1 vertebra without definite break in the cortical margins. There is a decrease in height of upper endplate of body of T7 vertebra with sclerosis. Possibility of recent compression fracture is not excluded. Degenerative changes are noted at thoracolumbar junction at T12-L1 level with minimal retrolisthesis and bony spurs. No definite focal lytic lesions are seen. IMPRESSION: There is 3.4 cm lobulated soft tissue mass in the medial aspect of right breast suggesting possible primary malignant neoplasm in the right breast. There is 20-30% decrease in height of upper endplate of body of T7 vertebra. This may suggest traumatic fracture or pathological fracture. As far as seen, no definite focal lytic lesions are seen in the bony structures in thorax. Cardiomegaly. Small to moderate pericardial effusion. Coronary artery disease. Large hiatal hernia. Small amount of fluid is seen in the hernial sac in the hiatal hernia. Linear patchy densities are seen in both lower lung fields, more so on the right side suggesting scarring and subsegmental atelectasis. Electronically Signed   By: Ernie Avena M.D.   On: 03/04/2023 18:41   MR PELVIS W WO CONTRAST  Result  Date: 03/04/2023 CLINICAL DATA:  Pelvic pain.  Lytic lesions seen on CT EXAM: MRI PELVIS WITHOUT AND WITH CONTRAST TECHNIQUE: Multiplanar multisequence MR imaging of the pelvis was performed both before and after administration of intravenous contrast. CONTRAST:  5mL GADAVIST GADOBUTROL 1 MMOL/ML IV SOLN COMPARISON:  CT 03/04/2023 FINDINGS: Bones/Joint/Cartilage Multiple marrow replacing bone lesions within the pelvis. Largest lesion within the right iliac wing measures 6.0 x 2.2 x 5.9 cm. This lesion is expansile with significant cortical thinning and pathologic fracture. Small amount of extraosseous soft tissue component which extends into the adjacent gluteus minimus muscle (series 10, image 12). Additional lesion within the superior right acetabulum measures up to 4.8 cm in size. This does appear to extend intra-articularly into the superior aspect of the right hip joint (series 11, image 23). Additional smaller lesions within the posterior right acetabulum and within the sacrum at S1 (the S1 segment is lumbarized). There is also a tiny enhancing lesions within the posterior aspect of the right femoral head and posterior left acetabulum. Bony pelvis remains intact without diastasis. Bilateral hip joints are intact without fracture or dislocation. Degenerative changes of both hips, right worse than left. Ligaments Intact. Muscles and Tendons Partial tearing of the left gluteus minimus tendon, likely chronic. Soft tissues Mildly enlarged left inguinal lymph node measuring 1.1 cm. Extensive colonic diverticulosis. IMPRESSION: 1. Multiple marrow replacing bone lesions within the pelvis, largest within the right iliac wing measuring up to 6.0 cm in size. This lesion is expansile with significant cortical thinning and pathologic fracture. Small amount of extraosseous soft tissue component which extends into the adjacent gluteus minimus muscle. 2. Additional lesions include the right acetabulum, right femoral head,  sacrum, and left acetabulum. Appearance favors metastatic disease or myeloma. 3. Mildly enlarged left inguinal lymph node measuring 1.1 cm, nonspecific. 4. Partial tearing of the left gluteus minimus tendon, likely chronic. Electronically Signed   By: Duanne Guess D.O.   On: 03/04/2023 15:36   CT PELVIS WO CONTRAST  Result Date: 03/04/2023 CLINICAL DATA:  Pain after injury EXAM: CT PELVIS WITHOUT  CONTRAST TECHNIQUE: Multidetector CT imaging of the pelvis was performed following the standard protocol without intravenous contrast. RADIATION DOSE REDUCTION: This exam was performed according to the departmental dose-optimization program which includes automated exposure control, adjustment of the mA and/or kV according to patient size and/or use of iterative reconstruction technique. COMPARISON:  None Available. FINDINGS: Urinary Tract:  Preserved contours of the urinary bladder. Bowel: Sigmoid colon diverticulosis. The visualized bowel is nondilated the pelvis. There is a right lower quadrant ostomy with a stomal hernia of small bowel. Please correlate with the history Vascular/Lymphatic: Mild scattered vascular calcifications are identified. No specific abnormal lymph node enlargement identified in the visualized pelvis. Reproductive:  Uterus is absent.  No separate adnexal mass. Other:  No free fluid. Musculoskeletal: Concentric joint space loss of the hip joints with osteophyte formation, right worse than left. There is also osteophytes and sclerosis along the sacroiliac joints. Hypertrophic changes along the pubic symphysis. Global osteopenia. No obvious fracture. Partial sacralization left side of L5 with some bony fusion. However there are lytic destructive lesions identified along the right iliac bone towards the superior anterior iliac crest as well as along the margin of the right acetabulum. Example series 2, image 19 measures 3.2 by 2.1 cm. This breaches the medial cortex of the bone. Right superior  acetabular lesion on series 4, image 44 of the coronal data set measures 19 x 13 mm. There is also some disruption along the cortex of the bone at the level of the joint space. Please correlate for any history of known malignancy or further workup is recommended such as MRI or bone scan IMPRESSION: Moderate multifocal degenerative changes identified without obvious fracture or dislocation. Lytic destructive bone lesions involving the right hemipelvis are noted including superior acetabular region. Please correlate for any known history of malignancy otherwise further evaluation is recommended such as pelvic MRI with and without contrast and whole-body bone scan. Of note the lesions identified do have areas of cortex which are disrupted. Electronically Signed   By: Karen Kays M.D.   On: 03/04/2023 12:49   DG Hip Unilat W or Wo Pelvis 2-3 Views Right  Result Date: 03/04/2023 CLINICAL DATA:  Pain after injury EXAM: DG HIP (WITH OR WITHOUT PELVIS) 3V RIGHT COMPARISON:  None FINDINGS: Osteopenia. Global joint space loss of the sacroiliac joints and hip joints with hyperostosis. No dislocation. There is subtle linear lucency along the base of the femoral head on the right. A nondisplaced injury is difficult to exclude with the level of underlying osteopenia and recommend further workup with CT to confirm presence of injury. Several probable vascular calcifications in the pelvis. IMPRESSION: Osteopenia. There is subtle linear lucency along the base of the femoral head on the right. A nondisplaced injury is difficult to exclude with the level of underlying osteopenia and recommend further workup with CT to confirm presence of injury. Electronically Signed   By: Karen Kays M.D.   On: 03/04/2023 11:04     Assessment and plan- Patient is a 89 y.o. female with newly diagnosed metastatic breast cancer with bone metastases ER positive HER2 negative here to discuss further management  Pelvic bone mets: PET scan  tomorrow followed by consideration for palliative radiation.  She has been seen by radiation oncology as well.  Neoplasm related pain: I will start her on oxycodone 5 mg every 6 hours as needed.  Metastatic breast cancer: Bone biopsy confirms metastatic adenocarcinoma consistent with breast primary.  ER 100% positive and HER2 negative.  Specimen was not decalcified and therefore additional testing for actionable mutations is also possible down the line.  When I met the patient in the hospital she did not wish to pursue any treatment whatsoever.  Today we discussed that you are positive breast cancer tends to have good prognosis which can be even for a few years with endocrine therapy.  I do recommend starting the letrozole once daily for estrogen blockade.  Discussed recent benefits of letrozole including all but not limited to hot flashes, mood swings, fatigue and arthralgias.  Treatment will be given with a palliative intent.  Patient understands and agrees to proceed as planned.  She will also take calcium 1200 mg and vitamin D 800 international units.  Ideally patient needs combination CDK inhibitors along with letrozole and I would recommend lowest dose of palbociclib/Ibrance 75 mg 3 weeks on 1 week off.  This will send benefits of Ibrance including all but not limited to nausea vomiting low blood counts, risk of infections and hospitalization.  Patient and her family would like to think about it and get back to me in about 5 to 6 weeks time if they want to start Ibrance.  They are obtaining insurance approval for [place] as well.  Also discussed the role for bisphosphonates like Zometa that can help with pain and also reduce the risk of skeletal fractures.  Discussed risks and benefits of Zometa including all but not limited to possible risk of osteonecrosis of the jaw.  Patient would like to start with letrozole for now and we will discuss bisphosphonates further when I see her back in 5 weeks  time  I am checking CBC with differential CMP CA 27-29 and CA 15-3 today.  PT/INR will also be checked given the patient was recently off Coumadin for her biopsy.  Patient is in the process of moving to assisted living in [place].  If patient wishes to transfer her care to [facility] she could see Dr. Samson Frederic down the line   Cancer Staging  Carcinoma of right breast metastatic to bone [facility]) Staging form: Breast, AJCC 8th Edition - Clinical: Stage IV (pM1, ER+, PR: Unknown, HER2-) - Signed by Creig Hines, MD on 03/22/2023     Visit Diagnosis 1. Carcinoma of right breast metastatic to bone (HCC)   2. Goals of care, counseling/discussion      Dr. Owens Shark, MD, MPH [facility] at [facility] [phone] 03/22/2023 8:59 AM

## 2023-03-22 NOTE — Telephone Encounter (Signed)
Oral Oncology Patient Advocate Encounter  New authorization   Received notification that prior authorization for Ilda Foil is required.   PA submitted on 03/22/23  Key [id number]  Status is pending     Ardeen Fillers, CPhT Oncology Pharmacy Patient Advocate  [facility]  [phone] (phone) [phone] (fax) 03/22/2023 1:01 PM

## 2023-03-22 NOTE — Telephone Encounter (Signed)
Oral Oncology Patient Advocate Encounter  Received notification that the request for prior authorization for Ilda Foil has been denied due to [facility] Part D prefers [place] or [place] over [place]. Patient must have a documented medical reason as to why [place] or [place] cannot be used before insurance will approve Ibrance.      Ardeen Fillers, CPhT Oncology Pharmacy Patient Advocate  [facility]  [phone] (phone) [phone] (fax) 03/22/2023 1:21 PM

## 2023-03-23 ENCOUNTER — Ambulatory Visit
Admission: RE | Admit: 2023-03-23 | Discharge: 2023-03-23 | Disposition: A | Discharge status: Home / Self Care | Payer: Medicare PPO | Source: Ambulatory Visit | Attending: Radiation Oncology | Admitting: Radiation Oncology

## 2023-03-23 DIAGNOSIS — Z923 Personal history of irradiation: Secondary | ICD-10-CM | POA: Diagnosis not present

## 2023-03-23 DIAGNOSIS — Z85038 Personal history of other malignant neoplasm of large intestine: Secondary | ICD-10-CM | POA: Diagnosis not present

## 2023-03-23 DIAGNOSIS — M899 Disorder of bone, unspecified: Secondary | ICD-10-CM | POA: Diagnosis not present

## 2023-03-23 DIAGNOSIS — Z8551 Personal history of malignant neoplasm of bladder: Secondary | ICD-10-CM | POA: Diagnosis not present

## 2023-03-23 DIAGNOSIS — C801 Malignant (primary) neoplasm, unspecified: Secondary | ICD-10-CM | POA: Diagnosis not present

## 2023-03-23 DIAGNOSIS — R911 Solitary pulmonary nodule: Secondary | ICD-10-CM | POA: Diagnosis not present

## 2023-03-23 DIAGNOSIS — Z853 Personal history of malignant neoplasm of breast: Secondary | ICD-10-CM | POA: Diagnosis not present

## 2023-03-23 DIAGNOSIS — C7951 Secondary malignant neoplasm of bone: Secondary | ICD-10-CM | POA: Insufficient documentation

## 2023-03-23 DIAGNOSIS — C50911 Malignant neoplasm of unspecified site of right female breast: Secondary | ICD-10-CM | POA: Insufficient documentation

## 2023-03-23 LAB — CANCER ANTIGEN 15-3: CA 15-3: 124 U/mL — ABNORMAL HIGH (ref 0.0–25.0)

## 2023-03-23 LAB — GLUCOSE, CAPILLARY: Glucose-Capillary: 96 mg/dL (ref 70–99)

## 2023-03-23 LAB — CANCER ANTIGEN 27.29: CA 27.29: 211.3 U/mL — ABNORMAL HIGH (ref 0.0–38.6)

## 2023-03-23 MED ORDER — FLUDEOXYGLUCOSE F - 18 (FDG) INJECTION
6.5000 | Freq: Once | INTRAVENOUS | Status: AC | PRN
  Administered 2023-03-23: 6.99 via INTRAVENOUS

## 2023-03-24 DIAGNOSIS — Z932 Ileostomy status: Secondary | ICD-10-CM | POA: Diagnosis not present

## 2023-03-24 DIAGNOSIS — R791 Abnormal coagulation profile: Secondary | ICD-10-CM | POA: Diagnosis not present

## 2023-03-24 DIAGNOSIS — Z923 Personal history of irradiation: Secondary | ICD-10-CM | POA: Diagnosis not present

## 2023-03-24 DIAGNOSIS — Z85038 Personal history of other malignant neoplasm of large intestine: Secondary | ICD-10-CM | POA: Diagnosis not present

## 2023-03-24 DIAGNOSIS — Z853 Personal history of malignant neoplasm of breast: Secondary | ICD-10-CM | POA: Diagnosis not present

## 2023-03-24 DIAGNOSIS — C801 Malignant (primary) neoplasm, unspecified: Secondary | ICD-10-CM | POA: Diagnosis not present

## 2023-03-24 DIAGNOSIS — C7951 Secondary malignant neoplasm of bone: Secondary | ICD-10-CM | POA: Diagnosis not present

## 2023-03-24 DIAGNOSIS — Z8551 Personal history of malignant neoplasm of bladder: Secondary | ICD-10-CM | POA: Diagnosis not present

## 2023-03-24 DIAGNOSIS — M899 Disorder of bone, unspecified: Secondary | ICD-10-CM | POA: Diagnosis not present

## 2023-03-29 ENCOUNTER — Ambulatory Visit
Admission: RE | Admit: 2023-03-29 | Discharge: 2023-03-29 | Disposition: A | Discharge status: Home / Self Care | Payer: Medicare PPO | Source: Ambulatory Visit | Attending: Radiation Oncology | Admitting: Radiation Oncology

## 2023-03-29 ENCOUNTER — Other Ambulatory Visit: Payer: Self-pay

## 2023-03-29 DIAGNOSIS — C7951 Secondary malignant neoplasm of bone: Secondary | ICD-10-CM | POA: Insufficient documentation

## 2023-03-29 DIAGNOSIS — Z85038 Personal history of other malignant neoplasm of large intestine: Secondary | ICD-10-CM | POA: Diagnosis not present

## 2023-03-29 DIAGNOSIS — Z853 Personal history of malignant neoplasm of breast: Secondary | ICD-10-CM | POA: Diagnosis not present

## 2023-03-29 DIAGNOSIS — Z923 Personal history of irradiation: Secondary | ICD-10-CM | POA: Diagnosis not present

## 2023-03-29 DIAGNOSIS — C801 Malignant (primary) neoplasm, unspecified: Secondary | ICD-10-CM | POA: Insufficient documentation

## 2023-03-29 DIAGNOSIS — Z510 Encounter for antineoplastic radiation therapy: Secondary | ICD-10-CM | POA: Diagnosis not present

## 2023-03-29 DIAGNOSIS — M899 Disorder of bone, unspecified: Secondary | ICD-10-CM | POA: Insufficient documentation

## 2023-03-29 DIAGNOSIS — Z8551 Personal history of malignant neoplasm of bladder: Secondary | ICD-10-CM | POA: Diagnosis not present

## 2023-03-29 LAB — RAD ONC ARIA SESSION SUMMARY
Course Elapsed Days: 0
Plan Fractions Treated to Date: 1
Plan Prescribed Dose Per Fraction: 4 Gy
Plan Total Fractions Prescribed: 5
Plan Total Prescribed Dose: 20 Gy
Reference Point Dosage Given to Date: 4 Gy
Reference Point Session Dosage Given: 4 Gy
Session Number: 1

## 2023-03-30 ENCOUNTER — Ambulatory Visit
Admission: RE | Admit: 2023-03-30 | Discharge: 2023-03-30 | Disposition: A | Discharge status: Home / Self Care | Payer: Medicare PPO | Source: Ambulatory Visit | Attending: Radiation Oncology | Admitting: Radiation Oncology

## 2023-03-30 ENCOUNTER — Other Ambulatory Visit: Payer: Self-pay

## 2023-03-30 DIAGNOSIS — Z8551 Personal history of malignant neoplasm of bladder: Secondary | ICD-10-CM | POA: Diagnosis not present

## 2023-03-30 DIAGNOSIS — Z923 Personal history of irradiation: Secondary | ICD-10-CM | POA: Diagnosis not present

## 2023-03-30 DIAGNOSIS — M899 Disorder of bone, unspecified: Secondary | ICD-10-CM | POA: Diagnosis not present

## 2023-03-30 DIAGNOSIS — Z85038 Personal history of other malignant neoplasm of large intestine: Secondary | ICD-10-CM | POA: Diagnosis not present

## 2023-03-30 DIAGNOSIS — Z853 Personal history of malignant neoplasm of breast: Secondary | ICD-10-CM | POA: Diagnosis not present

## 2023-03-30 DIAGNOSIS — C7951 Secondary malignant neoplasm of bone: Secondary | ICD-10-CM | POA: Diagnosis not present

## 2023-03-30 DIAGNOSIS — C801 Malignant (primary) neoplasm, unspecified: Secondary | ICD-10-CM | POA: Diagnosis not present

## 2023-03-30 LAB — RAD ONC ARIA SESSION SUMMARY
Course Elapsed Days: 1
Plan Fractions Treated to Date: 2
Plan Prescribed Dose Per Fraction: 4 Gy
Plan Total Fractions Prescribed: 5
Plan Total Prescribed Dose: 20 Gy
Reference Point Dosage Given to Date: 8 Gy
Reference Point Session Dosage Given: 4 Gy
Session Number: 2

## 2023-03-31 ENCOUNTER — Ambulatory Visit
Admission: RE | Admit: 2023-03-31 | Discharge: 2023-03-31 | Disposition: A | Discharge status: Home / Self Care | Payer: Medicare PPO | Source: Ambulatory Visit | Attending: Radiation Oncology | Admitting: Radiation Oncology

## 2023-03-31 ENCOUNTER — Other Ambulatory Visit: Payer: Self-pay

## 2023-03-31 DIAGNOSIS — C801 Malignant (primary) neoplasm, unspecified: Secondary | ICD-10-CM | POA: Diagnosis not present

## 2023-03-31 DIAGNOSIS — C7951 Secondary malignant neoplasm of bone: Secondary | ICD-10-CM | POA: Diagnosis not present

## 2023-03-31 DIAGNOSIS — M899 Disorder of bone, unspecified: Secondary | ICD-10-CM | POA: Diagnosis not present

## 2023-03-31 DIAGNOSIS — Z8551 Personal history of malignant neoplasm of bladder: Secondary | ICD-10-CM | POA: Diagnosis not present

## 2023-03-31 DIAGNOSIS — Z923 Personal history of irradiation: Secondary | ICD-10-CM | POA: Diagnosis not present

## 2023-03-31 DIAGNOSIS — Z85038 Personal history of other malignant neoplasm of large intestine: Secondary | ICD-10-CM | POA: Diagnosis not present

## 2023-03-31 DIAGNOSIS — Z853 Personal history of malignant neoplasm of breast: Secondary | ICD-10-CM | POA: Diagnosis not present

## 2023-03-31 LAB — RAD ONC ARIA SESSION SUMMARY
Course Elapsed Days: 2
Plan Fractions Treated to Date: 3
Plan Prescribed Dose Per Fraction: 4 Gy
Plan Total Fractions Prescribed: 5
Plan Total Prescribed Dose: 20 Gy
Reference Point Dosage Given to Date: 12 Gy
Reference Point Session Dosage Given: 4 Gy
Session Number: 3

## 2023-04-01 ENCOUNTER — Encounter: Payer: Self-pay | Admitting: Oncology

## 2023-04-01 ENCOUNTER — Ambulatory Visit
Admission: RE | Admit: 2023-04-01 | Discharge: 2023-04-01 | Disposition: A | Discharge status: Home / Self Care | Payer: Medicare PPO | Source: Ambulatory Visit | Attending: Radiation Oncology | Admitting: Radiation Oncology

## 2023-04-01 ENCOUNTER — Other Ambulatory Visit: Payer: Self-pay

## 2023-04-01 DIAGNOSIS — Z8551 Personal history of malignant neoplasm of bladder: Secondary | ICD-10-CM | POA: Diagnosis not present

## 2023-04-01 DIAGNOSIS — Z85038 Personal history of other malignant neoplasm of large intestine: Secondary | ICD-10-CM | POA: Diagnosis not present

## 2023-04-01 DIAGNOSIS — C801 Malignant (primary) neoplasm, unspecified: Secondary | ICD-10-CM | POA: Diagnosis not present

## 2023-04-01 DIAGNOSIS — Z853 Personal history of malignant neoplasm of breast: Secondary | ICD-10-CM | POA: Diagnosis not present

## 2023-04-01 DIAGNOSIS — C7951 Secondary malignant neoplasm of bone: Secondary | ICD-10-CM | POA: Diagnosis not present

## 2023-04-01 DIAGNOSIS — Z923 Personal history of irradiation: Secondary | ICD-10-CM | POA: Diagnosis not present

## 2023-04-01 DIAGNOSIS — M899 Disorder of bone, unspecified: Secondary | ICD-10-CM | POA: Diagnosis not present

## 2023-04-01 LAB — RAD ONC ARIA SESSION SUMMARY
Course Elapsed Days: 3
Plan Fractions Treated to Date: 4
Plan Prescribed Dose Per Fraction: 4 Gy
Plan Total Fractions Prescribed: 5
Plan Total Prescribed Dose: 20 Gy
Reference Point Dosage Given to Date: 16 Gy
Reference Point Session Dosage Given: 4 Gy
Session Number: 4

## 2023-04-04 ENCOUNTER — Other Ambulatory Visit: Payer: Self-pay

## 2023-04-04 ENCOUNTER — Encounter: Payer: Self-pay | Admitting: Oncology

## 2023-04-04 ENCOUNTER — Ambulatory Visit
Admission: RE | Admit: 2023-04-04 | Discharge: 2023-04-04 | Disposition: A | Discharge status: Home / Self Care | Payer: Medicare PPO | Source: Ambulatory Visit | Attending: Radiation Oncology | Admitting: Radiation Oncology

## 2023-04-04 DIAGNOSIS — M899 Disorder of bone, unspecified: Secondary | ICD-10-CM | POA: Diagnosis not present

## 2023-04-04 DIAGNOSIS — C801 Malignant (primary) neoplasm, unspecified: Secondary | ICD-10-CM | POA: Diagnosis not present

## 2023-04-04 DIAGNOSIS — Z8551 Personal history of malignant neoplasm of bladder: Secondary | ICD-10-CM | POA: Diagnosis not present

## 2023-04-04 DIAGNOSIS — Z923 Personal history of irradiation: Secondary | ICD-10-CM | POA: Diagnosis not present

## 2023-04-04 DIAGNOSIS — Z853 Personal history of malignant neoplasm of breast: Secondary | ICD-10-CM | POA: Diagnosis not present

## 2023-04-04 DIAGNOSIS — Z85038 Personal history of other malignant neoplasm of large intestine: Secondary | ICD-10-CM | POA: Diagnosis not present

## 2023-04-04 DIAGNOSIS — C7951 Secondary malignant neoplasm of bone: Secondary | ICD-10-CM | POA: Diagnosis not present

## 2023-04-04 LAB — RAD ONC ARIA SESSION SUMMARY
Course Elapsed Days: 6
Plan Fractions Treated to Date: 5
Plan Prescribed Dose Per Fraction: 4 Gy
Plan Total Fractions Prescribed: 5
Plan Total Prescribed Dose: 20 Gy
Reference Point Dosage Given to Date: 20 Gy
Reference Point Session Dosage Given: 4 Gy
Session Number: 5

## 2023-04-05 NOTE — Telephone Encounter (Signed)
Oral Chemotherapy Encounter   Expedited appeal for Ibrance (palbociclib) faxed to [facility] on 04/05/2023.   Gara Kroner, PharmD PGY1 Pharmacy Resident - [facility] [phone]  04/05/2023 2:00 PM

## 2023-04-06 ENCOUNTER — Other Ambulatory Visit (HOSPITAL_COMMUNITY): Payer: Self-pay

## 2023-04-06 NOTE — Telephone Encounter (Signed)
Oral Oncology Patient Advocate Encounter  Prior Authorization for Ilda Foil has been approved.    PA# [id number]  Effective dates: 04/05/23 through 10/03/23  Patients co-pay is $100.00.    Ardeen Fillers, CPhT Oncology Pharmacy Patient Advocate  [facility]  [phone] (phone) [phone] (fax) 04/06/2023 8:54 AM

## 2023-04-07 ENCOUNTER — Non-Acute Institutional Stay: Payer: Self-pay | Admitting: Internal Medicine

## 2023-04-07 ENCOUNTER — Encounter: Payer: Self-pay | Admitting: Internal Medicine

## 2023-04-07 DIAGNOSIS — E039 Hypothyroidism, unspecified: Secondary | ICD-10-CM | POA: Diagnosis not present

## 2023-04-07 DIAGNOSIS — C7951 Secondary malignant neoplasm of bone: Secondary | ICD-10-CM | POA: Diagnosis not present

## 2023-04-07 DIAGNOSIS — I10 Essential (primary) hypertension: Secondary | ICD-10-CM | POA: Diagnosis not present

## 2023-04-07 DIAGNOSIS — C50911 Malignant neoplasm of unspecified site of right female breast: Secondary | ICD-10-CM | POA: Diagnosis not present

## 2023-04-07 DIAGNOSIS — I482 Chronic atrial fibrillation, unspecified: Secondary | ICD-10-CM

## 2023-04-07 DIAGNOSIS — F5101 Primary insomnia: Secondary | ICD-10-CM

## 2023-04-07 DIAGNOSIS — M25551 Pain in right hip: Secondary | ICD-10-CM | POA: Diagnosis not present

## 2023-04-07 DIAGNOSIS — F418 Other specified anxiety disorders: Secondary | ICD-10-CM | POA: Diagnosis not present

## 2023-04-07 DIAGNOSIS — R791 Abnormal coagulation profile: Secondary | ICD-10-CM | POA: Diagnosis not present

## 2023-04-07 MED ORDER — OXYCODONE HCL 5 MG PO TABS: 2.5000 mg | ORAL_TABLET | Freq: Four times a day (QID) | ORAL | 0 refills | Status: DC | PRN

## 2023-04-07 NOTE — Progress Notes (Signed)
Location: [facility] of Service:  ALF (13)  Provider:   Code Status: DNR Goals of Care:     03/22/2023    9:14 AM  Advanced Directives  Does Patient Have a Medical Advance Directive? Yes  Type of Estate agent of [place];Living will     Chief Complaint  Patient presents with   Acute Visit    HPI: Patient is a 89 y.o. female seen today for an acute visit for New Admit to AL and Establish Care  Patient was living by herself in her house.  She went to ED for nonlimiting pain in her right hip.  X-ray found multiple lytic lesions in her pelvis. Patient has a history of breast cancer 20 years ago, bladder cancer 10 years ago, colon cancer 3 years ago.  CT-guided bone biopsy showed metastatic poorly differentiated adenocarcinoma consistent with breast cancer ER 100% positive  Since then patient also had PET scan done which shows multifocal osseous metastasis including in the left L1, left L5 and iliac crest and right Acetabular Patient also has a history of A-fib.  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  She is getting admitted in a well.  Per her daughter patient would need can do transfers to a wheelchair which can help and then do her ADLs.  She would need help with changing her ileostomy bag and med management.  Her pain is better since palliative radiation on her right pelvis  She is very open to hospice but at this time wants to consider Ibrance for the treatment Already on Letrozole   Past Medical History:  Diagnosis Date   Acid reflux    Anemia    Anxiety    Atrial fibrillation (HCC)    Bladder cancer (HCC)    Breast cancer (HCC)    Cancer (HCC)    breast,bladder   Depression    Dysrhythmia    afib   GERD (gastroesophageal reflux  disease)    History of hiatal hernia    HOH (hard of hearing)    aids   Hypertension    Hyperthyroidism    Hypothyroidism    Large bowel obstruction (HCC) 04/08/2021   Stroke (HCC)    2010   TIA (transient ischemic attack)     Past Surgical History:  Procedure Laterality Date   ABDOMINAL HYSTERECTOMY     ANKLE ARTHODESIS W/ ARTHROSCOPY     BLADDER SURGERY     CATARACT EXTRACTION W/PHACO Left 12/02/2014   Procedure: CATARACT EXTRACTION PHACO AND INTRAOCULAR LENS PLACEMENT (IOC);  Surgeon: Sallee Lange, MD;  Location: [facility] ORS;  Service: Ophthalmology;  Laterality: Left;  US01:45 AP%25.7 CDE42.99   CHOLECYSTECTOMY     EYE SURGERY     cataract   ILEOSTOMY CLOSURE N/A 04/15/2021   Procedure: ILEOSTOMY TAKEDOWN REVISION;  Surgeon: Campbell Lerner, MD;  Location: [facility] ORS;  Service: General;  Laterality: N/A;  not a takedown, just a revision...   INSERTION OF SUPRAPUBIC CATHETER  04/06/2021   Procedure: INSERTION OF FOLEY CATHETER;  Surgeon: Sondra Come, MD;  Location: [facility] ORS;  Service: Urology;;   JOINT REPLACEMENT     bil tkr   MASTECTOMY     partial right   REPLACEMENT TOTAL KNEE      Allergies  Allergen Reactions  Celebrex [Celecoxib] Anaphylaxis   Morphine And Codeine Swelling    Lips and mouth   Povidone-Iodine Other (See Comments)    Other reaction(s): ITCHING Other reaction(s): ITCHING    Sulfa Antibiotics     Outpatient Encounter Medications as of 04/07/2023  Medication Sig   oxyCODONE (ROXICODONE) 5 MG immediate release tablet Take 0.5 tablets (2.5 mg total) by mouth every 6 (six) hours as needed for severe pain.   warfarin (COUMADIN) 3 MG tablet Take 6 mg by mouth once a week. On Wed   warfarin (COUMADIN) 4 MG tablet Take 4 mg by mouth. 6 days a week. 6 mg on Wed   acetaminophen (TYLENOL) 325 MG tablet Take 2 tablets (650 mg total) by mouth every 6 (six) hours as needed for mild pain or fever.   ALPRAZolam (XANAX) 0.25 MG tablet Take 0.5 tablets  (0.125 mg total) by mouth at bedtime as needed for anxiety.   letrozole (FEMARA) 2.5 MG tablet Take 1 tablet (2.5 mg total) by mouth daily.   levothyroxine (SYNTHROID, LEVOTHROID) 112 MCG tablet Take 112 mcg by mouth daily before breakfast.   sertraline (ZOLOFT) 50 MG tablet Take 50 mg by mouth daily. hs   [DISCONTINUED] amLODipine (NORVASC) 5 MG tablet Take by mouth. (Patient not taking: Reported on 03/22/2023)   [DISCONTINUED] feeding supplement (ENSURE ENLIVE / ENSURE PLUS) LIQD Take 237 mLs by mouth 3 (three) times daily between meals. (Patient not taking: Reported on 03/22/2023)   [DISCONTINUED] iron polysaccharides (NIFEREX) 150 MG capsule Take 1 capsule (150 mg total) by mouth daily. (Patient not taking: Reported on 03/17/2023)   [DISCONTINUED] oxyCODONE (OXY IR/ROXICODONE) 5 MG immediate release tablet Take 1 tablet (5 mg total) by mouth every 6 (six) hours as needed for severe pain.   [DISCONTINUED] warfarin (COUMADIN) 5 MG tablet Take 1 tablet (5 mg total) by mouth daily. HOLD WARFARIN UNTIL INR Improves and Restarted at the Direction of Cardiology   No facility-administered encounter medications on file as of 04/07/2023.    Review of Systems:  Review of Systems  Constitutional:  Positive for activity change. Negative for appetite change.  HENT: Negative.    Respiratory:  Negative for cough and shortness of breath.   Cardiovascular:  Negative for leg swelling.  Gastrointestinal:  Negative for constipation.  Genitourinary: Negative.   Musculoskeletal:  Positive for arthralgias and gait problem. Negative for myalgias.  Skin: Negative.   Neurological:  Negative for dizziness and weakness.  Psychiatric/Behavioral:  Negative for confusion, dysphoric mood and sleep disturbance.     Health Maintenance  Topic Date Due   Medicare Annual Wellness (AWV)  Never done   DTaP/Tdap/Td (1 - Tdap) Never done   Zoster Vaccines- Shingrix (1 of 2) 02/10/1951   Pneumonia Vaccine 36+ Years old (1 of 1  - PCV) Never done   DEXA SCAN  Never done   INFLUENZA VACCINE  02/24/2023   COVID-19 Vaccine (4 - 2023-24 season) 03/27/2023   HPV VACCINES  Aged Out    Physical Exam: Vitals:   04/08/23 0944  BP: 116/71  Pulse: 74  Resp: 20  Temp: 97.8 F (36.6 C)   There is no height or weight on file to calculate BMI. Physical Exam Vitals reviewed.  Constitutional:      Appearance: Normal appearance.  HENT:     Head: Normocephalic.     Nose: Nose normal.     Mouth/Throat:     Mouth: Mucous membranes are moist.     Pharynx: Oropharynx is clear.  Eyes:     Pupils: Pupils are equal, round, and reactive to light.  Cardiovascular:     Rate and Rhythm: Normal rate. Rhythm irregular.     Pulses: Normal pulses.     Heart sounds: Normal heart sounds. No murmur heard. Pulmonary:     Effort: Pulmonary effort is normal.     Breath sounds: Normal breath sounds.  Abdominal:     General: Abdomen is flat. Bowel sounds are normal.     Palpations: Abdomen is soft.     Comments: Ileostomy present  Musculoskeletal:        General: No swelling.     Cervical back: Neck supple.  Skin:    General: Skin is warm.  Neurological:     General: No focal deficit present.     Mental Status: She is alert and oriented to person, place, and time.  Psychiatric:        Mood and Affect: Mood normal.        Thought Content: Thought content normal.     Labs reviewed: Basic Metabolic Panel: Recent Labs    03/04/23 1635 03/22/23 0958  NA 140 137  K 4.0 3.9  CL 109 110  CO2 23 21*  GLUCOSE 119* 108*  BUN 16 22  CREATININE 0.95 0.96  CALCIUM 9.0 9.6   Liver Function Tests: Recent Labs    03/22/23 0958  AST 20  ALT 19  ALKPHOS 52  BILITOT 0.4  PROT 7.3  ALBUMIN 4.0   No results for input(s): "LIPASE", "AMYLASE" in the last 8760 hours. No results for input(s): "AMMONIA" in the last 8760 hours. CBC: Recent Labs    03/22/23 0958  WBC 6.3  NEUTROABS 4.6  HGB 12.3  HCT 37.6  MCV 96.7  PLT  235   Lipid Panel: No results for input(s): "CHOL", "HDL", "LDLCALC", "TRIG", "CHOLHDL", "LDLDIRECT" in the last 8760 hours. No results found for: "HGBA1C"  Procedures since last visit: NM PET Image Initial (PI) Skull Base To Thigh  Result Date: 03/27/2023 CLINICAL DATA:  Subsequent treatment strategy for right hip lytic metastasis related to breast cancer. Additional history of colon and bladder cancer. EXAM: NUCLEAR MEDICINE PET SKULL BASE TO THIGH TECHNIQUE: 7.0 mCi F-18 FDG was injected intravenously. Full-ring PET imaging was performed from the skull base to thigh after the radiotracer. CT data was obtained and used for attenuation correction and anatomic localization. Fasting blood glucose: 96 mg/dl COMPARISON:  CT chest abdomen pelvis dated 03/04/2023. FINDINGS: Mediastinal blood pool activity: SUV max 2.7 Liver activity: SUV max NA NECK: No hypermetabolic cervical lymphadenopathy. Incidental CT findings: None. CHEST: 2.8 x 3.4 cm medial right breast mass (series 5/image 56), max SUV 11.4, corresponding to the patient's known primary breast carcinoma. 4 mm subpleural nodule in the right lower lobe (series 5/image 55), non FDG avid but beneath the size threshold PET sensitivity. No suspicious pulmonary nodules. No hypermetabolic thoracic lymphadenopathy. Incidental CT findings: Atherosclerotic calcifications of the aortic arch. Mild three-vessel coronary atherosclerosis. Cardiomegaly. ABDOMEN/PELVIS: No abnormal hypermetabolism in the liver, spleen, pancreas, or adrenal glands. No hypermetabolic abdominopelvic lymphadenopathy. Incidental CT findings: Large hiatal hernia/inverted intrathoracic stomach. Cholecystectomy. Multiple bilateral renal cysts. Status post right hemicolectomy with right mid abdominal ileostomy. Associated moderate parastomal hernia containing nondilated loops of bowel. Long Hartmann's pouch with sigmoid diverticulosis, without evidence of diverticulitis. Atherosclerotic  calcifications of the abdominal aorta and branch vessels. SKELETON: Multifocal osseous metastases, including: --Left L1 vertebral body, max SUV 12.0 --Left posterior L5 vertebral body, max SUV  9.5 --3.8 cm right iliac crest metastasis (series 5/image 112), max SUV 15.4 --2.9 cm right acetabular metastasis (series 5/image 144), max SUV 15.8 --Posterior column of the right acetabulum, max SUV 11.8 Incidental CT findings: Degenerative changes of the visualized thoracolumbar spine with lumbar dextroscoliosis. IMPRESSION: 3.4 cm medial right breast mass, corresponding to the patient's known primary breast carcinoma. Multifocal osseous metastases involving the lumbar spine and right pelvis, as above. 4 mm subpleural nodule in the right lower lobe, non FDG avid but beneath the size threshold PET sensitivity. Attention on follow-up is suggested. Electronically Signed   By: Charline Bills M.D.   On: 03/27/2023 02:07   CT BONE TROCAR/NEEDLE BIOPSY DEEP  Result Date: 03/17/2023 CLINICAL DATA:  History of colon, bladder and breast carcinoma with lytic lesions of the pelvis. EXAM: CT GUIDED CORE BIOPSY OF RIGHT ILIAC BONE ANESTHESIA/SEDATION: Moderate (conscious) sedation was employed during this procedure. A total of Versed 0.5 mg and Fentanyl 50 mcg was administered intravenously by radiology nursing. Moderate Sedation Time: 18 minutes. The patient's level of consciousness and vital signs were monitored continuously by radiology nursing throughout the procedure under my direct supervision. PROCEDURE: The procedure risks, benefits, and alternatives were explained to the patient. Questions regarding the procedure were encouraged and answered. The patient understands and consents to the procedure. A time-out was performed prior to initiating the procedure. The anterior right pelvic region was prepped with chlorhexidine in a sterile fashion, and a sterile drape was applied covering the operative field. A sterile gown and  sterile gloves were used for the procedure. Local anesthesia was provided with 1% Lidocaine. An 11 gauge OnControl bone cutting needle was advanced from an anterior approach into the anterior right iliac bone at the level of a lytic lesion. After confirming needle tip position, a coaxial core biopsy sample was obtained. Two separate core biopsy samples were obtained and submitted in formalin. RADIATION DOSE REDUCTION: This exam was performed according to the departmental dose-optimization program which includes automated exposure control, adjustment of the mA and/or kV according to patient size and/or use of iterative reconstruction technique. COMPLICATIONS: None FINDINGS: Lytic and expansile lesion of the anterior aspect of the right iliac bone measures approximately 2.2 x 3.4 cm on axial imaging. Core biopsy yielded solid tissue and clot. IMPRESSION: CT-guided core biopsy performed of a lytic lesion within the anterior right iliac bone. Electronically Signed   By: Irish Lack M.D.   On: 03/17/2023 15:49    Assessment/Plan 1. 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 Pain in right hip Oxycodone PRn  3. Primary hypertension Took herself Off norvac Will continue to monitor  4. Atrial fibrillation, chronic (HCC) On Coumadin INR checked in [place] [facility] was 3.1 Will hold Coumadin today and restart her home dose tomorrow Check INR in 1 week Range 2-3  5. Acquired hypothyroidism Will need follow up of her TSH  6. Depression with anxiety zoloft  7. Primary insomnia Xanax 8 GERD Takes Protonix Prn 9 S/P Ileostomy Will need Nursing Help  10 Anemia/ B 12 Def Has been doing well on B 12 injections Off her Iron now Last HGB was normal Labs/tests ordered:  TSH and INR in 1 week Next appt:  Visit date not found

## 2023-04-08 ENCOUNTER — Encounter: Payer: Self-pay | Admitting: Internal Medicine

## 2023-04-11 ENCOUNTER — Non-Acute Institutional Stay (INDEPENDENT_AMBULATORY_CARE_PROVIDER_SITE_OTHER): Payer: Medicare PPO | Admitting: Orthopedic Surgery

## 2023-04-11 ENCOUNTER — Encounter: Payer: Self-pay | Admitting: Orthopedic Surgery

## 2023-04-11 DIAGNOSIS — M6281 Muscle weakness (generalized): Secondary | ICD-10-CM | POA: Diagnosis not present

## 2023-04-11 DIAGNOSIS — Z0000 Encounter for general adult medical examination without abnormal findings: Secondary | ICD-10-CM

## 2023-04-11 DIAGNOSIS — M25551 Pain in right hip: Secondary | ICD-10-CM | POA: Diagnosis not present

## 2023-04-11 DIAGNOSIS — D701 Agranulocytosis secondary to cancer chemotherapy: Secondary | ICD-10-CM | POA: Diagnosis not present

## 2023-04-11 NOTE — Progress Notes (Signed)
Subjective:   AROURA [name] is a 89 y.o. female who presents for Medicare Annual (Subsequent) preventive examination.  Visit Complete: In person  Patient Medicare AWV questionnaire was completed by the patient on 04/11/2023; I have confirmed that all information answered by patient is correct and no changes since this date.  Cardiac Risk Factors include: advanced age (>43men, >8 women);hypertension;sedentary lifestyle     Objective:    Today's Vitals   04/11/23 1008  BP: 132/68  Pulse: 82  Resp: 18  Temp: (!) 97.4 F (36.3 C)  SpO2: 96%  Weight: 129 lb (58.5 kg)  Height: 5\' 3"  (1.6 m)  PainSc: 0-No pain   Body mass index is 22.85 kg/m.     03/22/2023    9:14 AM 03/15/2023   10:24 AM 03/04/2023    9:44 AM 10/24/2021    7:29 PM 06/21/2021    4:18 PM 05/12/2021    2:55 PM 04/01/2021    8:38 AM  Advanced Directives  Does Patient Have a Medical Advance Directive? Yes Yes No Yes No No No  Type of Estate agent of [place];Living will Healthcare Power of [place];Living will  Healthcare Power of [place];Living will     Does patient want to make changes to medical advance directive?  No - Patient declined       Would patient like information on creating a medical advance directive?  No - Patient declined   No - Patient declined No - Patient declined No - Patient declined    Current Medications (verified) Outpatient Encounter Medications as of 04/11/2023  Medication Sig   acetaminophen (TYLENOL) 325 MG tablet Take 2 tablets (650 mg total) by mouth every 6 (six) hours as needed for mild pain or fever.   ALPRAZolam (XANAX) 0.25 MG tablet Take 0.5 tablets (0.125 mg total) by mouth at bedtime as needed for anxiety.   letrozole (FEMARA) 2.5 MG tablet Take 1 tablet (2.5 mg total) by mouth daily.   levothyroxine (SYNTHROID, LEVOTHROID) 112 MCG tablet Take 112 mcg by mouth daily before breakfast.   oxyCODONE (ROXICODONE) 5 MG immediate release tablet Take 0.5  tablets (2.5 mg total) by mouth every 6 (six) hours as needed for severe pain.   pantoprazole (PROTONIX) 40 MG tablet Take 40 mg by mouth daily as needed.   sertraline (ZOLOFT) 50 MG tablet Take 50 mg by mouth daily. hs   warfarin (COUMADIN) 3 MG tablet Take 6 mg by mouth once a week. On Wed   warfarin (COUMADIN) 4 MG tablet Take 4 mg by mouth. 6 days a week. 6 mg on Wed   No facility-administered encounter medications on file as of 04/11/2023.    Allergies (verified) Celebrex [celecoxib], Morphine and codeine, Povidone-iodine, and Sulfa antibiotics   History: Past Medical History:  Diagnosis Date   Acid reflux    Anemia    Anxiety    Atrial fibrillation (HCC)    Bladder cancer (HCC)    Breast cancer (HCC)    Cancer (HCC)    breast,bladder   Depression    Dysrhythmia    afib   GERD (gastroesophageal reflux disease)    History of hiatal hernia    HOH (hard of hearing)    aids   Hypertension    Hyperthyroidism    Hypothyroidism    Large bowel obstruction (HCC) 04/08/2021   Stroke (HCC)    2010   TIA (transient ischemic attack)    Past Surgical History:  Procedure Laterality Date  ABDOMINAL HYSTERECTOMY     ANKLE ARTHODESIS W/ ARTHROSCOPY     BLADDER SURGERY     CATARACT EXTRACTION W/PHACO Left 12/02/2014   Procedure: CATARACT EXTRACTION PHACO AND INTRAOCULAR LENS PLACEMENT (IOC);  Surgeon: Sallee Lange, MD;  Location: [facility] ORS;  Service: Ophthalmology;  Laterality: Left;  US01:45 AP%25.7 CDE42.99   CHOLECYSTECTOMY     EYE SURGERY     cataract   ILEOSTOMY CLOSURE N/A 04/15/2021   Procedure: ILEOSTOMY TAKEDOWN REVISION;  Surgeon: Campbell Lerner, MD;  Location: [facility] ORS;  Service: General;  Laterality: N/A;  not a takedown, just a revision...   INSERTION OF SUPRAPUBIC CATHETER  04/06/2021   Procedure: INSERTION OF FOLEY CATHETER;  Surgeon: Sondra Come, MD;  Location: [facility] ORS;  Service: Urology;;   JOINT REPLACEMENT     bil tkr   MASTECTOMY     partial  right   REPLACEMENT TOTAL KNEE     Family History  Adopted: Yes   Social History   Socioeconomic History   Marital status: Widowed    Spouse name: Not on file   Number of children: Not on file   Years of education: Not on file   Highest education level: Not on file  Occupational History   Not on file  Tobacco Use   Smoking status: Never   Smokeless tobacco: Never  Vaping Use   Vaping status: Never Used  Substance and Sexual Activity   Alcohol use: No   Drug use: Never   Sexual activity: Not on file  Other Topics Concern   Not on file  Social History Narrative   Not on file   Social Determinants of Health   Financial Resource Strain: Low Risk  (04/11/2023)   Overall Financial Resource Strain (CARDIA)    Difficulty of Paying Living Expenses: Not hard at all  Food Insecurity: No Food Insecurity (04/11/2023)   Hunger Vital Sign    Worried About Running Out of Food in the Last Year: Never true    Ran Out of Food in the Last Year: Never true  Transportation Needs: No Transportation Needs (04/11/2023)   PRAPARE - Administrator, Civil Service (Medical): No    Lack of Transportation (Non-Medical): No  Physical Activity: Insufficiently Active (04/11/2023)   Exercise Vital Sign    Days of Exercise per Week: 7 days    Minutes of Exercise per Session: 10 min  Stress: No Stress Concern Present (04/11/2023)   [employer] of Occupational Health - Occupational Stress Questionnaire    Feeling of Stress : Only a little  Social Connections: Moderately Isolated (04/11/2023)   Social Connection and Isolation Panel [NHANES]    Frequency of Communication with Friends and Family: More than three times a week    Frequency of Social Gatherings with Friends and Family: Twice a week    Attends Religious Services: More than 4 times per year    Active Member of [employer] or Organizations: No    Attends Banker Meetings: Never    Marital Status: Widowed    Tobacco  Counseling Counseling given: Not Answered   Clinical Intake:  Pre-visit preparation completed: No  Pain : No/denies pain Pain Score: 0-No pain     BMI - recorded: 22.85 Nutritional Status: BMI of 19-24  Normal Nutritional Risks: None Diabetes: No  How often do you need to have someone help you when you read instructions, pamphlets, or other written materials from your doctor or pharmacy?: 2 - Rarely What is  the last grade level you completed in school?: 1 year college  Interpreter Needed?: No      Activities of Daily Living    04/11/2023   10:12 AM  In your present state of health, do you have any difficulty performing the following activities:  Hearing? 0  Vision? 0  Difficulty concentrating or making decisions? 0  Walking or climbing stairs? 1  Dressing or bathing? 0  Doing errands, shopping? 1  Preparing Food and eating ? Y  Using the Toilet? N  In the past six months, have you accidently leaked urine? Y  Do you have problems with loss of bowel control? N  Managing your Medications? Y  Managing your Finances? Y  Housekeeping or managing your Housekeeping? Y    Patient Care Team: Mahlon Gammon, MD as PCP - General (Internal Medicine) Creig Hines, MD as Consulting Physician (Oncology)  Indicate any recent Medical Services you may have received from other than [facility] providers in the past year (date may be approximate).     Assessment:   This is a routine wellness examination for Saranne.  Hearing/Vision screen No results found.   Goals Addressed             This Visit's Progress    DIET - INCREASE WATER INTAKE   Not on track    Maintain Mobility and Function   On track    Evidence-based guidance:  Acknowledge and validate impact of pain, loss of strength and potential disfigurement (hand osteoarthritis) on mental health and daily life, such as social isolation, anxiety, depression, impaired sexual relationship and   injury from falls.   Anticipate referral to physical or occupational therapy for assessment, therapeutic exercise and recommendation for adaptive equipment or assistive devices; encourage participation.  Assess impact on ability to perform activities of daily living, as well as engage in sports and leisure events or requirements of work or school.  Provide anticipatory guidance and reassurance about the benefit of exercise to maintain function; acknowledge and normalize fear that exercise may worsen symptoms.  Encourage regular exercise, at least 10 minutes at a time for 45 minutes per week; consider yoga, water exercise and proprioceptive exercises; encourage use of wearable activity tracker to increase motivation and adherence.  Encourage maintenance or resumption of daily activities, including employment, as pain allows and with minimal exposure to trauma.  Assist patient to advocate for adaptations to the work environment.  Consider level of pain and function, gender, age, lifestyle, patient preference, quality of life, readiness and ?ocapacity to benefit? when recommending patients for orthopaedic surgery consultation.  Explore strategies, such as changes to medication regimen or activity that enables patient to anticipate and manage flare-ups that increase deconditioning and disability.  Explore patient preferences; encourage exposure to a broader range of activities that have been avoided for fear of experiencing pain.  Identify barriers to participation in therapy or exercise, such as pain with activity, anticipated or imagined pain.  Monitor postoperative joint replacement or any preexisting joint replacement for ongoing pain and loss of function; provide social support and encouragement throughout recovery.   Notes:        Depression Screen    04/11/2023   10:15 AM  PHQ 2/9 Scores  PHQ - 2 Score 0    Fall Risk    04/11/2023   10:16 AM 05/05/2021    9:49 AM  Fall Risk   Falls in the past year? 1  1  Number falls in past yr: 1  0  Injury with Fall? 1 1  Risk for fall due to : History of fall(s);Impaired balance/gait;Impaired mobility   Follow up Falls evaluation completed;Education provided;Falls prevention discussed Falls evaluation completed    MEDICARE RISK AT HOME: Medicare Risk at Home Any stairs in or around the home?: No If so, are there any without handrails?: No Home free of loose throw rugs in walkways, pet beds, electrical cords, etc?: Yes Adequate lighting in your home to reduce risk of falls?: Yes Life alert?: No Use of a cane, walker or w/c?: Yes Grab bars in the bathroom?: Yes Shower chair or bench in shower?: Yes Elevated toilet seat or a handicapped toilet?: Yes  TIMED UP AND GO:  Was the test performed?  No    Cognitive Function:    04/11/2023   10:17 AM  MMSE - Mini Mental State Exam  Orientation to time 5  Orientation to Place 5  Registration 3  Attention/ Calculation 5  Recall 2  Language- name 2 objects 2  Language- repeat 1  Language- follow 3 step command 3  Language- read & follow direction 1  Write a sentence 1  Copy design 1  Total score 29        Immunizations Immunization History  Administered Date(s) Administered   PFIZER Comirnaty(Gray Top)Covid-19 Tri-Sucrose Vaccine 08/22/2019, 09/12/2019, 05/26/2020    TDAP status: Up to date  Flu Vaccine status: Due, Education has been provided regarding the importance of this vaccine. Advised may receive this vaccine at local pharmacy or Health Dept. Aware to provide a copy of the vaccination record if obtained from local pharmacy or Health Dept. Verbalized acceptance and understanding.  Pneumococcal vaccine status: Up to date  Covid-19 vaccine status: Completed vaccines  Qualifies for Shingles Vaccine? Yes   Zostavax completed Yes   Shingrix Completed?: No.    Education has been provided regarding the importance of this vaccine. Patient has been advised to call insurance company to  determine out of pocket expense if they have not yet received this vaccine. Advised may also receive vaccine at local pharmacy or Health Dept. Verbalized acceptance and understanding.  Screening Tests Health Maintenance  Topic Date Due   DTaP/Tdap/Td (1 - Tdap) Never done   Zoster Vaccines- Shingrix (1 of 2) 02/10/1951   Pneumonia Vaccine 41+ Years old (1 of 1 - PCV) Never done   DEXA SCAN  Never done   INFLUENZA VACCINE  02/24/2023   COVID-19 Vaccine (4 - 2023-24 season) 03/27/2023   Medicare Annual Wellness (AWV)  04/10/2024   HPV VACCINES  Aged Out    Health Maintenance  Health Maintenance Due  Topic Date Due   DTaP/Tdap/Td (1 - Tdap) Never done   Zoster Vaccines- Shingrix (1 of 2) 02/10/1951   Pneumonia Vaccine 58+ Years old (1 of 1 - PCV) Never done   DEXA SCAN  Never done   INFLUENZA VACCINE  02/24/2023   COVID-19 Vaccine (4 - 2023-24 season) 03/27/2023    Colorectal cancer screening: No longer required.   Mammogram status: No longer required due to advanced age.  Bone Density status: Completed cannot remember. Results reflect: Bone density results: NORMAL. Repeat every no further studies per goals of care years.  Lung Cancer Screening: (Low Dose CT Chest recommended if Age 33-80 years, 20 pack-year currently smoking OR have quit w/in 15years.) does not qualify.   Lung Cancer Screening Referral: no  Additional Screening:  Hepatitis C Screening: does not qualify; Completed   Vision Screening: Recommended annual ophthalmology  exams for early detection of glaucoma and other disorders of the eye. Is the patient up to date with their annual eye exam?  Yes  Who is the provider or what is the name of the office in which the patient attends annual eye exams? [facility]  If pt is not established with a provider, would they like to be referred to a provider to establish care? No .   Dental Screening: Recommended annual dental exams for proper oral hygiene  Diabetic Foot Exam:  Diabetic Foot Exam: Completed 04/11/2023  Community Resource Referral / Chronic Care Management: CRR required this visit?  No   CCM required this visit?  No     Plan:     I have personally reviewed and noted the following in the patient's chart:   Medical and social history Use of alcohol, tobacco or illicit drugs  Current medications and supplements including opioid prescriptions. Patient is not currently taking opioid prescriptions. Functional ability and status Nutritional status Physical activity Advanced directives List of other physicians Hospitalizations, surgeries, and ER visits in previous 12 months Vitals Screenings to include cognitive, depression, and falls Referrals and appointments  In addition, I have reviewed and discussed with patient certain preventive protocols, quality metrics, and best practice recommendations. A written personalized care plan for preventive services as well as general preventive health recommendations were provided to patient.     Octavia Heir, NP   04/11/2023   After Visit Summary: (MyChart) Due to this being a telephonic visit, the after visit summary with patients personalized plan was offered to patient via MyChart   Nurse Notes: 09/12 moved to [place] at [facility]. MMSE 29/30, nursing is working on getting immunization list. Flu and covid to be offered next month per [facility].

## 2023-04-11 NOTE — Telephone Encounter (Signed)
Spoke with patient's daughter Darl Pikes, she said he mom had decided not to proceed with the Ibrance at this time. She knows to let the office know if this changes in the future. Oral chemo team will sign off of the patient for now.

## 2023-04-12 ENCOUNTER — Other Ambulatory Visit: Payer: Medicare PPO

## 2023-04-12 DIAGNOSIS — D701 Agranulocytosis secondary to cancer chemotherapy: Secondary | ICD-10-CM | POA: Diagnosis not present

## 2023-04-12 DIAGNOSIS — R2681 Unsteadiness on feet: Secondary | ICD-10-CM | POA: Diagnosis not present

## 2023-04-12 DIAGNOSIS — M6281 Muscle weakness (generalized): Secondary | ICD-10-CM | POA: Diagnosis not present

## 2023-04-12 DIAGNOSIS — R278 Other lack of coordination: Secondary | ICD-10-CM | POA: Diagnosis not present

## 2023-04-12 DIAGNOSIS — M25551 Pain in right hip: Secondary | ICD-10-CM | POA: Diagnosis not present

## 2023-04-13 DIAGNOSIS — M25551 Pain in right hip: Secondary | ICD-10-CM | POA: Diagnosis not present

## 2023-04-13 DIAGNOSIS — M6281 Muscle weakness (generalized): Secondary | ICD-10-CM | POA: Diagnosis not present

## 2023-04-13 DIAGNOSIS — D701 Agranulocytosis secondary to cancer chemotherapy: Secondary | ICD-10-CM | POA: Diagnosis not present

## 2023-04-14 DIAGNOSIS — R2681 Unsteadiness on feet: Secondary | ICD-10-CM | POA: Diagnosis not present

## 2023-04-14 DIAGNOSIS — E039 Hypothyroidism, unspecified: Secondary | ICD-10-CM | POA: Diagnosis not present

## 2023-04-14 DIAGNOSIS — R278 Other lack of coordination: Secondary | ICD-10-CM | POA: Diagnosis not present

## 2023-04-14 DIAGNOSIS — I482 Chronic atrial fibrillation, unspecified: Secondary | ICD-10-CM | POA: Diagnosis not present

## 2023-04-14 DIAGNOSIS — D701 Agranulocytosis secondary to cancer chemotherapy: Secondary | ICD-10-CM | POA: Diagnosis not present

## 2023-04-14 DIAGNOSIS — M6281 Muscle weakness (generalized): Secondary | ICD-10-CM | POA: Diagnosis not present

## 2023-04-14 DIAGNOSIS — M25551 Pain in right hip: Secondary | ICD-10-CM | POA: Diagnosis not present

## 2023-04-14 DIAGNOSIS — D802 Selective deficiency of immunoglobulin A [IgA]: Secondary | ICD-10-CM | POA: Diagnosis not present

## 2023-04-18 DIAGNOSIS — M25551 Pain in right hip: Secondary | ICD-10-CM | POA: Diagnosis not present

## 2023-04-18 DIAGNOSIS — R278 Other lack of coordination: Secondary | ICD-10-CM | POA: Diagnosis not present

## 2023-04-18 DIAGNOSIS — R2681 Unsteadiness on feet: Secondary | ICD-10-CM | POA: Diagnosis not present

## 2023-04-18 DIAGNOSIS — M6281 Muscle weakness (generalized): Secondary | ICD-10-CM | POA: Diagnosis not present

## 2023-04-18 DIAGNOSIS — D701 Agranulocytosis secondary to cancer chemotherapy: Secondary | ICD-10-CM | POA: Diagnosis not present

## 2023-04-19 DIAGNOSIS — M6281 Muscle weakness (generalized): Secondary | ICD-10-CM | POA: Diagnosis not present

## 2023-04-19 DIAGNOSIS — D701 Agranulocytosis secondary to cancer chemotherapy: Secondary | ICD-10-CM | POA: Diagnosis not present

## 2023-04-19 DIAGNOSIS — M25551 Pain in right hip: Secondary | ICD-10-CM | POA: Diagnosis not present

## 2023-04-20 DIAGNOSIS — R2681 Unsteadiness on feet: Secondary | ICD-10-CM | POA: Diagnosis not present

## 2023-04-20 DIAGNOSIS — M25551 Pain in right hip: Secondary | ICD-10-CM | POA: Diagnosis not present

## 2023-04-20 DIAGNOSIS — R278 Other lack of coordination: Secondary | ICD-10-CM | POA: Diagnosis not present

## 2023-04-20 DIAGNOSIS — M6281 Muscle weakness (generalized): Secondary | ICD-10-CM | POA: Diagnosis not present

## 2023-04-20 DIAGNOSIS — D701 Agranulocytosis secondary to cancer chemotherapy: Secondary | ICD-10-CM | POA: Diagnosis not present

## 2023-04-22 DIAGNOSIS — R2681 Unsteadiness on feet: Secondary | ICD-10-CM | POA: Diagnosis not present

## 2023-04-22 DIAGNOSIS — M25551 Pain in right hip: Secondary | ICD-10-CM | POA: Diagnosis not present

## 2023-04-22 DIAGNOSIS — M6281 Muscle weakness (generalized): Secondary | ICD-10-CM | POA: Diagnosis not present

## 2023-04-22 DIAGNOSIS — D701 Agranulocytosis secondary to cancer chemotherapy: Secondary | ICD-10-CM | POA: Diagnosis not present

## 2023-04-22 DIAGNOSIS — R278 Other lack of coordination: Secondary | ICD-10-CM | POA: Diagnosis not present

## 2023-04-25 DIAGNOSIS — R278 Other lack of coordination: Secondary | ICD-10-CM | POA: Diagnosis not present

## 2023-04-25 DIAGNOSIS — M25551 Pain in right hip: Secondary | ICD-10-CM | POA: Diagnosis not present

## 2023-04-25 DIAGNOSIS — D701 Agranulocytosis secondary to cancer chemotherapy: Secondary | ICD-10-CM | POA: Diagnosis not present

## 2023-04-25 DIAGNOSIS — M6281 Muscle weakness (generalized): Secondary | ICD-10-CM | POA: Diagnosis not present

## 2023-04-25 DIAGNOSIS — R2681 Unsteadiness on feet: Secondary | ICD-10-CM | POA: Diagnosis not present

## 2023-04-26 ENCOUNTER — Ambulatory Visit: Payer: Medicare PPO | Admitting: Oncology

## 2023-04-26 ENCOUNTER — Other Ambulatory Visit: Payer: Medicare PPO

## 2023-04-26 DIAGNOSIS — M25551 Pain in right hip: Secondary | ICD-10-CM | POA: Diagnosis not present

## 2023-04-26 DIAGNOSIS — M6281 Muscle weakness (generalized): Secondary | ICD-10-CM | POA: Diagnosis not present

## 2023-04-26 DIAGNOSIS — D701 Agranulocytosis secondary to cancer chemotherapy: Secondary | ICD-10-CM | POA: Diagnosis not present

## 2023-04-28 DIAGNOSIS — M6281 Muscle weakness (generalized): Secondary | ICD-10-CM | POA: Diagnosis not present

## 2023-04-28 DIAGNOSIS — D701 Agranulocytosis secondary to cancer chemotherapy: Secondary | ICD-10-CM | POA: Diagnosis not present

## 2023-04-28 DIAGNOSIS — M25551 Pain in right hip: Secondary | ICD-10-CM | POA: Diagnosis not present

## 2023-04-29 DIAGNOSIS — M25551 Pain in right hip: Secondary | ICD-10-CM | POA: Diagnosis not present

## 2023-04-29 DIAGNOSIS — D701 Agranulocytosis secondary to cancer chemotherapy: Secondary | ICD-10-CM | POA: Diagnosis not present

## 2023-04-29 DIAGNOSIS — R2681 Unsteadiness on feet: Secondary | ICD-10-CM | POA: Diagnosis not present

## 2023-04-29 DIAGNOSIS — R278 Other lack of coordination: Secondary | ICD-10-CM | POA: Diagnosis not present

## 2023-04-29 DIAGNOSIS — M6281 Muscle weakness (generalized): Secondary | ICD-10-CM | POA: Diagnosis not present

## 2023-05-03 DIAGNOSIS — M25551 Pain in right hip: Secondary | ICD-10-CM | POA: Diagnosis not present

## 2023-05-03 DIAGNOSIS — M6281 Muscle weakness (generalized): Secondary | ICD-10-CM | POA: Diagnosis not present

## 2023-05-03 DIAGNOSIS — D701 Agranulocytosis secondary to cancer chemotherapy: Secondary | ICD-10-CM | POA: Diagnosis not present

## 2023-05-05 DIAGNOSIS — M25551 Pain in right hip: Secondary | ICD-10-CM | POA: Diagnosis not present

## 2023-05-05 DIAGNOSIS — M6281 Muscle weakness (generalized): Secondary | ICD-10-CM | POA: Diagnosis not present

## 2023-05-05 DIAGNOSIS — D802 Selective deficiency of immunoglobulin A [IgA]: Secondary | ICD-10-CM | POA: Diagnosis not present

## 2023-05-05 DIAGNOSIS — D701 Agranulocytosis secondary to cancer chemotherapy: Secondary | ICD-10-CM | POA: Diagnosis not present

## 2023-05-05 DIAGNOSIS — I482 Chronic atrial fibrillation, unspecified: Secondary | ICD-10-CM | POA: Diagnosis not present

## 2023-05-09 ENCOUNTER — Other Ambulatory Visit: Payer: Self-pay | Admitting: Orthopedic Surgery

## 2023-05-09 ENCOUNTER — Ambulatory Visit: Payer: Medicare PPO | Admitting: Radiation Oncology

## 2023-05-09 DIAGNOSIS — F5101 Primary insomnia: Secondary | ICD-10-CM

## 2023-05-09 MED ORDER — ALPRAZOLAM 0.25 MG PO TABS
0.1250 mg | ORAL_TABLET | Freq: Every evening | ORAL | 0 refills | Status: DC | PRN
Start: 2023-05-09 — End: 2023-05-24

## 2023-05-09 MED ORDER — ALPRAZOLAM 0.25 MG PO TABS: 0.1250 mg | ORAL_TABLET | Freq: Every evening | ORAL | 0 refills | Status: DC | PRN

## 2023-05-10 DIAGNOSIS — M6281 Muscle weakness (generalized): Secondary | ICD-10-CM | POA: Diagnosis not present

## 2023-05-10 DIAGNOSIS — M25551 Pain in right hip: Secondary | ICD-10-CM | POA: Diagnosis not present

## 2023-05-10 DIAGNOSIS — D701 Agranulocytosis secondary to cancer chemotherapy: Secondary | ICD-10-CM | POA: Diagnosis not present

## 2023-05-20 ENCOUNTER — Non-Acute Institutional Stay: Payer: Self-pay | Admitting: Orthopedic Surgery

## 2023-05-20 ENCOUNTER — Encounter: Payer: Self-pay | Admitting: Orthopedic Surgery

## 2023-05-20 DIAGNOSIS — R112 Nausea with vomiting, unspecified: Secondary | ICD-10-CM | POA: Diagnosis not present

## 2023-05-20 MED ORDER — METOCLOPRAMIDE HCL 5 MG PO TABS: 5.0000 mg | ORAL_TABLET | Freq: Three times a day (TID) | ORAL | Status: DC | PRN

## 2023-05-20 NOTE — Progress Notes (Signed)
Location:  [facility] Room Number: 22/A Place of Service:  ALF [phone]) Provider:  Octavia Heir, NP   Mahlon Gammon, MD  Patient Care Team: Mahlon Gammon, MD as PCP - General (Internal Medicine) Creig Hines, MD as Consulting Physician (Oncology)  Extended Emergency Contact Information Primary Emergency Contact: Marda Stalker, [place] [address] [place] Home Phone: [phone] Mobile Phone: [phone] Relation: Daughter Secondary Emergency Contact: Dolby,Darrell L Address: [address]          [place], [place] [address] [place] Home Phone: [phone] Relation: Son  Code Status:  DNR Goals of care: Advanced Directive information    04/11/2023   10:27 AM  Advanced Directives  Does Patient Have a Medical Advance Directive? Yes  Type of Estate agent of [place];Out of facility DNR (pink MOST or yellow form)  Does patient want to make changes to medical advance directive? No - Patient declined  Copy of Healthcare Power of Attorney in Chart? No - copy requested     Chief Complaint  Patient presents with   Acute Visit    Nausea and vomiting    HPI:  Pt is a 89 y.o. female seen today for acute visit due to nausea and vomiting.   H/o colon cancer 3 years ago, s/p ileostomy after subtotal colectomy. This morning she was able to eat breakfast without complication. She ate lunch and felt increased nausea after finishing. She threw up her whole meal about 30 minutes later. Nausea improved after. She reports increased epigastric pain x 1 week. Ileostomy with normal output and gas. She is taking Protonix daily. Afebrile. Vitals stable.    Past Medical History:  Diagnosis Date   Acid reflux    Anemia    Anxiety    Atrial fibrillation (HCC)    Bladder cancer (HCC)    Breast cancer (HCC)    Cancer (HCC)    breast,bladder   Depression    Dysrhythmia    afib   GERD (gastroesophageal reflux  disease)    History of hiatal hernia    HOH (hard of hearing)    aids   Hypertension    Hyperthyroidism    Hypothyroidism    Large bowel obstruction (HCC) 04/08/2021   Stroke (HCC)    2010   TIA (transient ischemic attack)    Past Surgical History:  Procedure Laterality Date   ABDOMINAL HYSTERECTOMY     ANKLE ARTHODESIS W/ ARTHROSCOPY     BLADDER SURGERY     CATARACT EXTRACTION W/PHACO Left 12/02/2014   Procedure: CATARACT EXTRACTION PHACO AND INTRAOCULAR LENS PLACEMENT (IOC);  Surgeon: Sallee Lange, MD;  Location: [facility] ORS;  Service: Ophthalmology;  Laterality: Left;  US01:45 AP%25.7 CDE42.99   CHOLECYSTECTOMY     EYE SURGERY     cataract   ILEOSTOMY CLOSURE N/A 04/15/2021   Procedure: ILEOSTOMY TAKEDOWN REVISION;  Surgeon: Campbell Lerner, MD;  Location: [facility] ORS;  Service: General;  Laterality: N/A;  not a takedown, just a revision...   INSERTION OF SUPRAPUBIC CATHETER  04/06/2021   Procedure: INSERTION OF FOLEY CATHETER;  Surgeon: Sondra Come, MD;  Location: [facility] ORS;  Service: Urology;;   JOINT REPLACEMENT     bil tkr   MASTECTOMY     partial right   REPLACEMENT TOTAL KNEE      Allergies  Allergen Reactions   Celebrex [Celecoxib] Anaphylaxis   Morphine And Codeine  Swelling    Lips and mouth   Povidone-Iodine Other (See Comments)    Other reaction(s): ITCHING Other reaction(s): ITCHING    Sulfa Antibiotics     Outpatient Encounter Medications as of 05/20/2023  Medication Sig   acetaminophen (TYLENOL) 325 MG tablet Take 2 tablets (650 mg total) by mouth every 6 (six) hours as needed for mild pain or fever.   ALPRAZolam (XANAX) 0.25 MG tablet Take 0.5 tablets (0.125 mg total) by mouth at bedtime as needed for anxiety.   Calcium Carb-Cholecalciferol (CALCIUM 600+D) 600-20 MG-MCG TABS Take 1 tablet by mouth in the morning and at bedtime.   Cyanocobalamin (B-12 COMPLIANCE INJECTION IJ) Inject 1,000 mcg as directed every 30 (thirty) days.   letrozole (FEMARA)  2.5 MG tablet Take 1 tablet (2.5 mg total) by mouth daily.   levothyroxine (SYNTHROID, LEVOTHROID) 112 MCG tablet Take 112 mcg by mouth daily before breakfast.   oxyCODONE (ROXICODONE) 5 MG immediate release tablet Take 0.5 tablets (2.5 mg total) by mouth every 6 (six) hours as needed for severe pain.   pantoprazole (PROTONIX) 40 MG tablet Take 40 mg by mouth daily as needed.   sertraline (ZOLOFT) 50 MG tablet Take 50 mg by mouth daily. hs   tuberculin (TUBERSOL) 5 UNIT/0.1ML injection Inject 0.1 mLs into the skin as directed. Every shift.   warfarin (COUMADIN) 3 MG tablet Take 6 mg by mouth once a week. On Wed   warfarin (COUMADIN) 4 MG tablet Take 4 mg by mouth. 6 days a week. 6 mg on Wed   No facility-administered encounter medications on file as of 05/20/2023.    Review of Systems  Constitutional:  Negative for fatigue and fever.  Respiratory:  Negative for cough, shortness of breath and wheezing.   Cardiovascular:  Negative for chest pain and leg swelling.  Gastrointestinal:  Positive for abdominal pain, nausea and vomiting. Negative for abdominal distention, constipation and diarrhea.  Musculoskeletal:  Positive for gait problem.  Psychiatric/Behavioral:  Negative for confusion and dysphoric mood. The patient is not nervous/anxious.     Immunization History  Administered Date(s) Administered   Influenza Inj Mdck Quad Pf 05/28/2022   Influenza, High Dose Seasonal PF 05/30/2018, 05/31/2019, 05/26/2020   Influenza-Unspecified 05/30/2014, 06/09/2015, 04/25/2021   PFIZER Comirnaty(Gray Top)Covid-19 Tri-Sucrose Vaccine 08/22/2019, 09/12/2019, 05/26/2020   Pneumococcal Conjugate-13 05/31/2016   Pneumococcal Polysaccharide-23 07/27/2003   Tdap 04/16/2016   Zoster, Live 07/27/2003   Pertinent  Health Maintenance Due  Topic Date Due   DEXA SCAN  Never done   INFLUENZA VACCINE  02/24/2023      05/13/2021    7:30 PM 05/14/2021   10:00 AM 06/21/2021    4:18 PM 10/24/2021    7:29 PM  04/11/2023   10:16 AM  Fall Risk  Falls in the past year?     1  Was there an injury with Fall?     1  Fall Risk Category Calculator     3  (RETIRED) Patient Fall Risk Level High fall risk High fall risk Moderate fall risk Moderate fall risk   Patient at Risk for Falls Due to     History of fall(s);Impaired balance/gait;Impaired mobility  Fall risk Follow up     Falls evaluation completed;Education provided;Falls prevention discussed   Functional Status Survey:    Vitals:   05/20/23 1534  BP: 120/62  Pulse: 60  Resp: 18  Temp: (!) 97.4 F (36.3 C)  SpO2: 92%  Weight: 131 lb (59.4 kg)  Height: 5\' 3"  (  1.6 m)   Body mass index is 23.21 kg/m. Physical Exam Vitals reviewed.  Constitutional:      General: She is not in acute distress. HENT:     Head: Normocephalic.  Eyes:     General:        Right eye: No discharge.        Left eye: No discharge.  Cardiovascular:     Rate and Rhythm: Normal rate. Rhythm irregular.     Pulses: Normal pulses.     Heart sounds: Normal heart sounds.  Pulmonary:     Effort: Pulmonary effort is normal. No respiratory distress.     Breath sounds: Normal breath sounds. No wheezing.  Abdominal:     General: There is no distension.     Palpations: Abdomen is soft. There is no mass.     Tenderness: There is no abdominal tenderness. There is no guarding or rebound.     Hernia: No hernia is present.     Comments: Hypoactive bowel sounds x 4, RLQ ileostomy> normal output, stoma intact  Musculoskeletal:        General: Normal range of motion.     Cervical back: Neck supple.  Skin:    General: Skin is warm.     Capillary Refill: Capillary refill takes less than 2 seconds.  Neurological:     General: No focal deficit present.     Mental Status: She is alert and oriented to person, place, and time.     Gait: Gait abnormal.  Psychiatric:        Mood and Affect: Mood normal.     Labs reviewed: Recent Labs    03/04/23 1635 03/22/23 0958  NA  140 137  K 4.0 3.9  CL 109 110  CO2 23 21*  GLUCOSE 119* 108*  BUN 16 22  CREATININE 0.95 0.96  CALCIUM 9.0 9.6   Recent Labs    03/22/23 0958  AST 20  ALT 19  ALKPHOS 52  BILITOT 0.4  PROT 7.3  ALBUMIN 4.0   Recent Labs    03/22/23 0958  WBC 6.3  NEUTROABS 4.6  HGB 12.3  HCT 37.6  MCV 96.7  PLT 235   No results found for: "TSH" No results found for: "HGBA1C" Lab Results  Component Value Date   TRIG 77 04/20/2021    Significant Diagnostic Results in last 30 days:  No results found.  Assessment/Plan 1. Nausea and vomiting, unspecified vomiting type - 1 episode nausea and vomiting - intermittent epigastric abdominal pain x 1 week - bowel sounds hypoactive - h/o colon cancer, ileostomy present> normal output - KUB > mild to moderate feces in colon, no obstruction.  - liquid diet until KUB r/o obstruction - trial reglan prn for nausea and promote GI motility  - metoCLOPramide (REGLAN) 5 MG tablet; Take 1 tablet (5 mg total) by mouth 3 (three) times daily as needed for up to 14 days for nausea.  Addendum 05/23/2023: asymptomatic today, no nausea or vomiting. KUB results discussed with patient and daughter via telephone. Plan to give miralax BID x 2 day  Family/ staff Communication: plan discussed with patient and nurse  Labs/tests ordered:  KUB

## 2023-05-21 ENCOUNTER — Encounter: Payer: Self-pay | Admitting: Internal Medicine

## 2023-05-23 DIAGNOSIS — Z932 Ileostomy status: Secondary | ICD-10-CM | POA: Diagnosis not present

## 2023-05-23 DIAGNOSIS — Z85038 Personal history of other malignant neoplasm of large intestine: Secondary | ICD-10-CM | POA: Diagnosis not present

## 2023-05-24 ENCOUNTER — Other Ambulatory Visit: Payer: Self-pay | Admitting: Orthopedic Surgery

## 2023-05-24 DIAGNOSIS — F5101 Primary insomnia: Secondary | ICD-10-CM

## 2023-05-24 MED ORDER — ALPRAZOLAM 0.25 MG PO TABS: 0.2500 mg | ORAL_TABLET | Freq: Every day | ORAL | 0 refills | Status: DC

## 2023-06-13 ENCOUNTER — Ambulatory Visit (HOSPITAL_COMMUNITY): Payer: Medicare PPO | Admitting: Nurse Practitioner

## 2023-06-16 ENCOUNTER — Encounter: Payer: Self-pay | Admitting: Internal Medicine

## 2023-06-16 ENCOUNTER — Non-Acute Institutional Stay: Payer: Medicare PPO | Admitting: Internal Medicine

## 2023-06-16 DIAGNOSIS — E039 Hypothyroidism, unspecified: Secondary | ICD-10-CM | POA: Diagnosis not present

## 2023-06-16 DIAGNOSIS — I482 Chronic atrial fibrillation, unspecified: Secondary | ICD-10-CM | POA: Diagnosis not present

## 2023-06-16 DIAGNOSIS — K5901 Slow transit constipation: Secondary | ICD-10-CM | POA: Diagnosis not present

## 2023-06-16 DIAGNOSIS — M25551 Pain in right hip: Secondary | ICD-10-CM

## 2023-06-16 DIAGNOSIS — R198 Other specified symptoms and signs involving the digestive system and abdomen: Secondary | ICD-10-CM | POA: Diagnosis not present

## 2023-06-16 DIAGNOSIS — C7951 Secondary malignant neoplasm of bone: Secondary | ICD-10-CM

## 2023-06-16 DIAGNOSIS — C50911 Malignant neoplasm of unspecified site of right female breast: Secondary | ICD-10-CM

## 2023-06-16 DIAGNOSIS — Z932 Ileostomy status: Secondary | ICD-10-CM | POA: Diagnosis not present

## 2023-06-16 DIAGNOSIS — F418 Other specified anxiety disorders: Secondary | ICD-10-CM | POA: Diagnosis not present

## 2023-06-16 NOTE — Progress Notes (Signed)
[facility] Service:  ALF (13)  Provider:   Code Status: DNR Goals of Care:     04/11/2023   10:27 AM  Advanced Directives  Does Patient Have a Medical Advance Directive? Yes  Type of Estate agent of [place];Out of facility DNR (pink MOST or yellow form)  Does patient want to make changes to medical advance directive? No - Patient declined  Copy of Healthcare Power of Attorney in Chart? No - copy requested     Chief Complaint  Patient presents with   Acute Visit    HPI: Patient is a 89 y.o. female seen today for an acute visit for Rectal Discharge Possibly stool   Patient lives in AL in [facility]  Recent diagnosis of metastatic breast cancer PET scan done has shown multifocal osseous metastasis including in the left L1, left L5 and iliac crest and right Acetabular Patient also has a history of A-fib.  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 No Pain Or discomfort Constipation Over the weekend patient had some abdominal discomfort.  She got some MiraLAX and she says that it cleared up with a lot of backed up stool in her ileostomy.  She feels much better today Ileostomy stoma pain Plan to see the nurse Metastatic breast cancer Patient had before thought about considering Ibrance.  But today she said she does not want to see any oncologist and want to continue on Femara and pain control.  Past Medical History:  Diagnosis Date   Acid reflux    Anemia    Anxiety    Atrial fibrillation (HCC)    Bladder cancer (HCC)    Breast cancer (HCC)    Cancer (HCC)    breast,bladder   Depression    Dysrhythmia    afib   GERD (gastroesophageal reflux disease)    History  of hiatal hernia    HOH (hard of hearing)    aids   Hypertension    Hyperthyroidism    Hypothyroidism    Large bowel obstruction (HCC) 04/08/2021   Stroke (HCC)    2010   TIA (transient ischemic attack)     Past Surgical History:  Procedure Laterality Date   ABDOMINAL HYSTERECTOMY     ANKLE ARTHODESIS W/ ARTHROSCOPY     BLADDER SURGERY     CATARACT EXTRACTION W/PHACO Left 12/02/2014   Procedure: CATARACT EXTRACTION PHACO AND INTRAOCULAR LENS PLACEMENT (IOC);  Surgeon: Sallee Lange, MD;  Location: [facility] ORS;  Service: Ophthalmology;  Laterality: Left;  US01:45 AP%25.7 CDE42.99   CHOLECYSTECTOMY     EYE SURGERY     cataract   ILEOSTOMY CLOSURE N/A 04/15/2021   Procedure: ILEOSTOMY TAKEDOWN REVISION;  Surgeon: Campbell Lerner, MD;  Location: [facility] ORS;  Service: General;  Laterality: N/A;  not a takedown, just a revision...   INSERTION OF SUPRAPUBIC CATHETER  04/06/2021   Procedure: INSERTION OF FOLEY CATHETER;  Surgeon: Sondra Come, MD;  Location: [facility] ORS;  Service: Urology;;   JOINT REPLACEMENT     bil tkr   MASTECTOMY     partial right   REPLACEMENT TOTAL KNEE      Allergies  Allergen Reactions   Celebrex [Celecoxib] Anaphylaxis   Morphine And Codeine Swelling  Lips and mouth   Povidone-Iodine Other (See Comments)    Other reaction(s): ITCHING Other reaction(s): ITCHING    Sulfa Antibiotics     Outpatient Encounter Medications as of 06/16/2023  Medication Sig   acetaminophen (TYLENOL) 325 MG tablet Take 2 tablets (650 mg total) by mouth every 6 (six) hours as needed for mild pain or fever.   ALPRAZolam (XANAX) 0.25 MG tablet Take 1 tablet (0.25 mg total) by mouth at bedtime.   Calcium Carb-Cholecalciferol (CALCIUM 600+D) 600-20 MG-MCG TABS Take 1 tablet by mouth in the morning and at bedtime.   Cyanocobalamin (B-12 COMPLIANCE INJECTION IJ) Inject 1,000 mcg as directed every 30 (thirty) days.   letrozole (FEMARA) 2.5 MG tablet Take 1 tablet (2.5 mg total)  by mouth daily.   levothyroxine (SYNTHROID, LEVOTHROID) 112 MCG tablet Take 112 mcg by mouth daily before breakfast.   oxyCODONE (ROXICODONE) 5 MG immediate release tablet Take 0.5 tablets (2.5 mg total) by mouth every 6 (six) hours as needed for severe pain.   pantoprazole (PROTONIX) 40 MG tablet Take 40 mg by mouth daily as needed.   sertraline (ZOLOFT) 50 MG tablet Take 50 mg by mouth daily. hs   tuberculin (TUBERSOL) 5 UNIT/0.1ML injection Inject 0.1 mLs into the skin as directed. Every shift.   warfarin (COUMADIN) 4 MG tablet Take 4 mg by mouth daily.   [DISCONTINUED] metoCLOPramide (REGLAN) 5 MG tablet Take 1 tablet (5 mg total) by mouth 3 (three) times daily as needed for up to 14 days for nausea.   [DISCONTINUED] warfarin (COUMADIN) 3 MG tablet Take 6 mg by mouth once a week. On Wed   No facility-administered encounter medications on file as of 06/16/2023.    Review of Systems:  Review of Systems  Constitutional:  Negative for activity change and appetite change.  HENT: Negative.    Respiratory:  Negative for cough and shortness of breath.   Cardiovascular:  Negative for leg swelling.  Gastrointestinal:  Positive for abdominal distention and constipation.  Genitourinary: Negative.   Musculoskeletal:  Positive for gait problem and myalgias. Negative for arthralgias.  Skin: Negative.   Neurological:  Negative for dizziness and weakness.  Psychiatric/Behavioral:  Negative for confusion, dysphoric mood and sleep disturbance.     Health Maintenance  Topic Date Due   Zoster Vaccines- Shingrix (1 of 2) 02/10/1951   DEXA SCAN  Never done   INFLUENZA VACCINE  02/24/2023   COVID-19 Vaccine (4 - 2023-24 season) 03/27/2023   Medicare Annual Wellness (AWV)  04/10/2024   DTaP/Tdap/Td (2 - Td or Tdap) 04/16/2026   Pneumonia Vaccine 19+ Years old  Completed   HPV VACCINES  Aged Out    Physical Exam: Vitals:   06/16/23 1538  BP: (!) 144/85  Pulse: 85  Resp: 16  Temp: 98.5 F (36.9  C)  Weight: 131 lb (59.4 kg)   Body mass index is 23.21 kg/m. Physical Exam Vitals reviewed.  Constitutional:      Appearance: Normal appearance.  HENT:     Head: Normocephalic.     Nose: Nose normal.     Mouth/Throat:     Mouth: Mucous membranes are moist.     Pharynx: Oropharynx is clear.  Eyes:     Pupils: Pupils are equal, round, and reactive to light.  Cardiovascular:     Rate and Rhythm: Normal rate. Rhythm irregular.     Pulses: Normal pulses.     Heart sounds: Normal heart sounds. No murmur heard. Pulmonary:  Effort: Pulmonary effort is normal.     Breath sounds: Normal breath sounds.  Abdominal:     General: Abdomen is flat. Bowel sounds are normal.     Palpations: Abdomen is soft.  Musculoskeletal:        General: No swelling.     Cervical back: Neck supple.  Skin:    General: Skin is warm.  Neurological:     General: No focal deficit present.     Mental Status: She is alert and oriented to person, place, and time.  Psychiatric:        Mood and Affect: Mood normal.        Thought Content: Thought content normal.     Labs reviewed: Basic Metabolic Panel: Recent Labs    03/04/23 1635 03/22/23 0958  NA 140 137  K 4.0 3.9  CL 109 110  CO2 23 21*  GLUCOSE 119* 108*  BUN 16 22  CREATININE 0.95 0.96  CALCIUM 9.0 9.6   Liver Function Tests: Recent Labs    03/22/23 0958  AST 20  ALT 19  ALKPHOS 52  BILITOT 0.4  PROT 7.3  ALBUMIN 4.0   No results for input(s): "LIPASE", "AMYLASE" in the last 8760 hours. No results for input(s): "AMMONIA" in the last 8760 hours. CBC: Recent Labs    03/22/23 0958  WBC 6.3  NEUTROABS 4.6  HGB 12.3  HCT 37.6  MCV 96.7  PLT 235   Lipid Panel: No results for input(s): "CHOL", "HDL", "LDLCALC", "TRIG", "CHOLHDL", "LDLDIRECT" in the last 8760 hours. No results found for: "HGBA1C"  Procedures since last visit: No results found.  Assessment/Plan 1. Rectal discharge with Odor Will Try Enema to clean up  her Rectal Vault  2. Ileostomy in place [facility]) Is going to see Stoma Nurse   3. 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 Slow transit constipation Per her previous CT patient has hernia with possible causing her to have intermittent obstruction Start on Miralax 3/week  5. Pain in right hip Controlled with PRN Oxycodone  6. Atrial fibrillation, chronic (HCC) Coumadin managed by Me  7. Acquired hypothyroidism TSH normal in 9/24  8. Depression with anxiety Doing well with Zoloft and Xanax    Labs/tests ordered:  * No order type specified * Next appt:  Visit date not found

## 2023-06-21 ENCOUNTER — Other Ambulatory Visit: Payer: Self-pay | Admitting: Adult Health

## 2023-06-21 DIAGNOSIS — F5101 Primary insomnia: Secondary | ICD-10-CM

## 2023-06-21 MED ORDER — ALPRAZOLAM 0.25 MG PO TABS: 0.2500 mg | ORAL_TABLET | Freq: Every day | ORAL | 0 refills | Status: AC

## 2023-06-27 ENCOUNTER — Ambulatory Visit (HOSPITAL_COMMUNITY)
Admission: RE | Admit: 2023-06-27 | Discharge: 2023-06-27 | Disposition: A | Discharge status: Home / Self Care | Payer: Medicare PPO | Source: Ambulatory Visit | Attending: Internal Medicine | Admitting: Internal Medicine

## 2023-06-27 DIAGNOSIS — L24B3 Irritant contact dermatitis related to fecal or urinary stoma or fistula: Secondary | ICD-10-CM

## 2023-06-27 DIAGNOSIS — Z432 Encounter for attention to ileostomy: Secondary | ICD-10-CM

## 2023-06-27 DIAGNOSIS — K5900 Constipation, unspecified: Secondary | ICD-10-CM | POA: Diagnosis not present

## 2023-06-27 NOTE — Progress Notes (Signed)
[facility]   Reason for visit:  RUQ ileostomy HPI:   Past Medical History:  Diagnosis Date   Acid reflux    Anemia    Anxiety    Atrial fibrillation (HCC)    Bladder cancer (HCC)    Breast cancer (HCC)    Cancer (HCC)    breast,bladder   Depression    Dysrhythmia    afib   GERD (gastroesophageal reflux disease)    History of hiatal hernia    HOH (hard of hearing)    aids   Hypertension    Hyperthyroidism    Hypothyroidism    Large bowel obstruction (HCC) 04/08/2021   Stroke (HCC)    2010   TIA (transient ischemic attack)    Family History  Adopted: Yes   Allergies  Allergen Reactions   Celebrex [Celecoxib] Anaphylaxis   Morphine And Codeine Swelling    Lips and mouth   Povidone-Iodine Other (See Comments)    Other reaction(s): ITCHING Other reaction(s): ITCHING    Sulfa Antibiotics    Current Outpatient Medications  Medication Sig Dispense Refill Last Dose   acetaminophen (TYLENOL) 325 MG tablet Take 2 tablets (650 mg total) by mouth every 6 (six) hours as needed for mild pain or fever.      ALPRAZolam (XANAX) 0.25 MG tablet Take 1 tablet (0.25 mg total) by mouth at bedtime. 30 tablet 0    Calcium Carb-Cholecalciferol (CALCIUM 600+D) 600-20 MG-MCG TABS Take 1 tablet by mouth in the morning and at bedtime.      Cyanocobalamin (B-12 COMPLIANCE INJECTION IJ) Inject 1,000 mcg as directed every 30 (thirty) days.      letrozole (FEMARA) 2.5 MG tablet Take 1 tablet (2.5 mg total) by mouth daily. 30 tablet 3    levothyroxine (SYNTHROID, LEVOTHROID) 112 MCG tablet Take 75 mcg by mouth daily before breakfast.      oxyCODONE (ROXICODONE) 5 MG immediate release tablet Take 0.5 tablets (2.5 mg total) by mouth every 6 (six) hours as needed for severe pain. 30 tablet 0    pantoprazole (PROTONIX) 40 MG tablet Take 40 mg by mouth daily as needed.      sertraline (ZOLOFT) 50 MG tablet Take 50 mg by mouth daily. hs      tuberculin (TUBERSOL) 5 UNIT/0.1ML injection  Inject 0.1 mLs into the skin as directed. Every shift.      warfarin (COUMADIN) 4 MG tablet Take 4 mg by mouth daily.      No current facility-administered medications for this encounter.   ROS  Review of Systems  Gastrointestinal:  Positive for constipation.       RUQ ileostomy   Musculoskeletal:  Positive for gait problem.  Skin:  Positive for color change and rash.       Breakdown around stoma   Psychiatric/Behavioral: Negative.    All other systems reviewed and are negative.  Vital signs:  LMP  (LMP Unknown)  Exam:  Physical Exam Vitals reviewed.  Constitutional:      Appearance: Normal appearance.  Cardiovascular:     Rate and Rhythm: Normal rate and regular rhythm.     Pulses: Normal pulses.  Pulmonary:     Breath sounds: Normal breath sounds.  Abdominal:     Palpations: Abdomen is soft.     Hernia: A hernia is present.  Skin:    General: Skin is warm and dry.     Findings: Erythema (breakdown around stoma) present.  Neurological:     General: No  focal deficit present.     Mental Status: She is alert and oriented to person, place, and time.  Psychiatric:        Behavior: Behavior normal.     Stoma type/location:  RUQ ileostomy with parastomal hernia present Stomal assessment/size:  1 3/4" budded  Peristomal assessment:  denuded skin along bottom half.  Umbilicus is within pouching area and likely causing pouch to lose seal. Will cover this with a piece of barrier ring  Treatment options for stomal/peristomal skin:  barrier ring around stoma and at 3 o'clock extending to umbilicus to create a flat pouching surface.  Output: thick brown stool.  Slow transit constipation, has started miralax   Ostomy pouching: 2pc.  With barrier ring Education provided:  I perform pouch change with daughter present.  I demonstrate the barrier ring around stoma and at 3:00 over umbilicus.      Impression/dx  Ileostomy Constipation  Discussion  Create flat pouching surface with  barrier ring around stoma and over umbilicus.  I showed patient and her daughter barrier ring placement.   She will continue miralax as ordered, skipping doses if stool becomes too loose.  She states she drinks sufficient water to stay hydrated.  Plan  See back as needed.     Visit time: 55 minutes.   Maple Hudson FNP-BC

## 2023-06-28 DIAGNOSIS — K5901 Slow transit constipation: Secondary | ICD-10-CM | POA: Insufficient documentation

## 2023-06-28 DIAGNOSIS — L24B3 Irritant contact dermatitis related to fecal or urinary stoma or fistula: Secondary | ICD-10-CM | POA: Insufficient documentation

## 2023-06-28 NOTE — Discharge Instructions (Signed)
Demonstrated technique with barrier ring

## 2023-06-29 ENCOUNTER — Encounter: Payer: Self-pay | Admitting: Orthopedic Surgery

## 2023-06-29 ENCOUNTER — Non-Acute Institutional Stay: Payer: Medicare PPO | Admitting: Orthopedic Surgery

## 2023-06-29 DIAGNOSIS — K5901 Slow transit constipation: Secondary | ICD-10-CM | POA: Diagnosis not present

## 2023-06-29 DIAGNOSIS — Z932 Ileostomy status: Secondary | ICD-10-CM

## 2023-06-29 DIAGNOSIS — E039 Hypothyroidism, unspecified: Secondary | ICD-10-CM

## 2023-06-29 DIAGNOSIS — F339 Major depressive disorder, recurrent, unspecified: Secondary | ICD-10-CM | POA: Diagnosis not present

## 2023-06-29 DIAGNOSIS — M25551 Pain in right hip: Secondary | ICD-10-CM | POA: Diagnosis not present

## 2023-06-29 DIAGNOSIS — Z85038 Personal history of other malignant neoplasm of large intestine: Secondary | ICD-10-CM | POA: Diagnosis not present

## 2023-06-29 DIAGNOSIS — C50911 Malignant neoplasm of unspecified site of right female breast: Secondary | ICD-10-CM

## 2023-06-29 DIAGNOSIS — I482 Chronic atrial fibrillation, unspecified: Secondary | ICD-10-CM

## 2023-06-29 DIAGNOSIS — F419 Anxiety disorder, unspecified: Secondary | ICD-10-CM | POA: Diagnosis not present

## 2023-06-29 DIAGNOSIS — C7951 Secondary malignant neoplasm of bone: Secondary | ICD-10-CM

## 2023-06-29 NOTE — Progress Notes (Signed)
Location:  [facility] Room Number: 22/A Place of Service:  ALF [phone]) Provider:  Octavia Heir, NP   Mahlon Gammon, MD  Patient Care Team: Mahlon Gammon, MD as PCP - General (Internal Medicine) Creig Hines, MD as Consulting Physician (Oncology)  Extended Emergency Contact Information Primary Emergency Contact: Marda Stalker, [place] [address] [place] Home Phone: [phone] Mobile Phone: [phone] Relation: Daughter Secondary Emergency Contact: Rodd,Darrell L Address: [address]          [place], [place] [address] [place] Home Phone: [phone] Relation: Son  Code Status:  DNR Goals of care: Advanced Directive information    04/11/2023   10:27 AM  Advanced Directives  Does Patient Have a Medical Advance Directive? Yes  Type of Estate agent of [address];Out of facility DNR (pink MOST or yellow form)  Does patient want to make changes to medical advance directive? No - Patient declined  Copy of Healthcare Power of Attorney in Chart? No - copy requested     Chief Complaint  Patient presents with   Medical Management of Chronic Issues    HPI:  Pt is a 89 y.o. female seen today for medical management of chronic diseases.    She currently resides on the assisted living unit at [facility]. PMH: atrial fibrillation, HTN, stroke, hypothyroidism, right breast cancer with mets> 20 years ago s/p mastectomy, ileostomy s/p subtotal colectomy due to colon cancer> 3 years ago, bladder cancer 10 years ago, hiatal hernia, depression, anxiety and constipation.   Chronic afib- remains on warfarin for clot prevention, next PT/INR 12/05 Right breast cancer with mets- followed by oncology, recent PET scan showed mets to left L1/L5/ iliac crest and right acetabular, remains on Letrozole, not interested in Ibrance or future oncology visits H/o colon cancer with Ileostomy- s/p subtotal colectomy due  to colon cancer about 3 years ago, CEA 6.6 (08/09), followed by ostomy nurse for ostomy care, admits to intermittent gas pain> avoid food triggers Constipation- 10/25 KUB noted feces in colon without obstruction, symptoms improved with miralax 3x/week Hypothyroidism- remains on levothyroxine Right hip pain- no recent falls, remains on tylenol prn and oxycodone Depression and anxiety- Na+ 137 03/22/2023, adjusting well to AL, no panic attacks, remains on zoloft  Recent blood pressures:  11/20- 144/85  11/13- 158/77  11/06- 127/64  Recent weights:  12/01- 131.4 lbs  10/01- 131 lbs  09/16- 128 lbs     Past Medical History:  Diagnosis Date   Acid reflux    Anemia    Anxiety    Atrial fibrillation (HCC)    Bladder cancer (HCC)    Breast cancer (HCC)    Cancer (HCC)    breast,bladder   Depression    Dysrhythmia    afib   GERD (gastroesophageal reflux disease)    History of hiatal hernia    HOH (hard of hearing)    aids   Hypertension    Hyperthyroidism    Hypothyroidism    Large bowel obstruction (HCC) 04/08/2021   Stroke (HCC)    2010   TIA (transient ischemic attack)    Past Surgical History:  Procedure Laterality Date   ABDOMINAL HYSTERECTOMY     ANKLE ARTHODESIS W/ ARTHROSCOPY     BLADDER SURGERY     CATARACT EXTRACTION W/PHACO Left 12/02/2014   Procedure: CATARACT EXTRACTION PHACO AND INTRAOCULAR LENS PLACEMENT (IOC);  Surgeon: Viviann Spare  Dingeldein, MD;  Location: [facility] ORS;  Service: Ophthalmology;  Laterality: Left;  US01:45 AP%25.7 CDE42.99   CHOLECYSTECTOMY     EYE SURGERY     cataract   ILEOSTOMY CLOSURE N/A 04/15/2021   Procedure: ILEOSTOMY TAKEDOWN REVISION;  Surgeon: Campbell Lerner, MD;  Location: [facility] ORS;  Service: General;  Laterality: N/A;  not a takedown, just a revision...   INSERTION OF SUPRAPUBIC CATHETER  04/06/2021   Procedure: INSERTION OF FOLEY CATHETER;  Surgeon: Sondra Come, MD;  Location: [facility] ORS;  Service: Urology;;   JOINT REPLACEMENT      bil tkr   MASTECTOMY     partial right   REPLACEMENT TOTAL KNEE      Allergies  Allergen Reactions   Celebrex [Celecoxib] Anaphylaxis   Morphine And Codeine Swelling    Lips and mouth   Povidone-Iodine Other (See Comments)    Other reaction(s): ITCHING Other reaction(s): ITCHING    Sulfa Antibiotics     Outpatient Encounter Medications as of 06/29/2023  Medication Sig   acetaminophen (TYLENOL) 325 MG tablet Take 2 tablets (650 mg total) by mouth every 6 (six) hours as needed for mild pain or fever.   ALPRAZolam (XANAX) 0.25 MG tablet Take 1 tablet (0.25 mg total) by mouth at bedtime.   Calcium Carb-Cholecalciferol (CALCIUM 600+D) 600-20 MG-MCG TABS Take 1 tablet by mouth in the morning and at bedtime.   Cyanocobalamin (B-12 COMPLIANCE INJECTION IJ) Inject 1,000 mcg as directed every 30 (thirty) days.   letrozole (FEMARA) 2.5 MG tablet Take 1 tablet (2.5 mg total) by mouth daily.   levothyroxine (SYNTHROID, LEVOTHROID) 112 MCG tablet Take 75 mcg by mouth daily before breakfast.   oxyCODONE (ROXICODONE) 5 MG immediate release tablet Take 0.5 tablets (2.5 mg total) by mouth every 6 (six) hours as needed for severe pain.   pantoprazole (PROTONIX) 40 MG tablet Take 40 mg by mouth daily as needed.   sertraline (ZOLOFT) 50 MG tablet Take 50 mg by mouth daily. hs   tuberculin (TUBERSOL) 5 UNIT/0.1ML injection Inject 0.1 mLs into the skin as directed. Every shift.   warfarin (COUMADIN) 4 MG tablet Take 4 mg by mouth daily.   No facility-administered encounter medications on file as of 06/29/2023.    Review of Systems  Constitutional:  Negative for activity change and appetite change.  HENT:  Negative for congestion and trouble swallowing.   Eyes:  Negative for visual disturbance.  Respiratory:  Negative for cough, shortness of breath and wheezing.   Cardiovascular:  Negative for chest pain and leg swelling.  Gastrointestinal:  Positive for constipation. Negative for abdominal  distention, abdominal pain, diarrhea, nausea and vomiting.  Genitourinary:  Negative for dysuria, hematuria and vaginal bleeding.  Musculoskeletal:  Positive for arthralgias and gait problem.  Skin:  Negative for wound.  Neurological:  Positive for weakness. Negative for dizziness and headaches.  Psychiatric/Behavioral:  Positive for dysphoric mood. Negative for confusion and sleep disturbance. The patient is nervous/anxious.     Immunization History  Administered Date(s) Administered   Influenza Inj Mdck Quad Pf 05/28/2022   Influenza, High Dose Seasonal PF 05/30/2018, 05/31/2019, 05/26/2020   Influenza-Unspecified 05/30/2014, 06/09/2015, 04/25/2021   PFIZER Comirnaty(Gray Top)Covid-19 Tri-Sucrose Vaccine 08/22/2019, 09/12/2019, 05/26/2020   Pneumococcal Conjugate-13 05/31/2016   Pneumococcal Polysaccharide-23 07/27/2003   Tdap 04/16/2016   Zoster, Live 07/27/2003   Pertinent  Health Maintenance Due  Topic Date Due   DEXA SCAN  Never done   INFLUENZA VACCINE  02/24/2023  05/13/2021    7:30 PM 05/14/2021   10:00 AM 06/21/2021    4:18 PM 10/24/2021    7:29 PM 04/11/2023   10:16 AM  Fall Risk  Falls in the past year?     1  Was there an injury with Fall?     1  Fall Risk Category Calculator     3  (RETIRED) Patient Fall Risk Level High fall risk High fall risk Moderate fall risk Moderate fall risk   Patient at Risk for Falls Due to     History of fall(s);Impaired balance/gait;Impaired mobility  Fall risk Follow up     Falls evaluation completed;Education provided;Falls prevention discussed   Functional Status Survey:    Vitals:   06/29/23 1340  BP: (!) 144/85  Pulse: 85  Resp: 16  Temp: 98.5 F (36.9 C)  SpO2: 97%  Weight: 131 lb 6.4 oz (59.6 kg)  Height: 5\' 3"  (1.6 m)   Body mass index is 23.28 kg/m. Physical Exam Vitals reviewed.  Constitutional:      General: She is not in acute distress. HENT:     Head: Normocephalic.     Right Ear: There is no impacted  cerumen.     Left Ear: There is no impacted cerumen.     Nose: Nose normal.     Mouth/Throat:     Mouth: Mucous membranes are moist.  Eyes:     General:        Right eye: No discharge.        Left eye: No discharge.  Cardiovascular:     Rate and Rhythm: Normal rate. Rhythm irregular.     Pulses: Normal pulses.     Heart sounds: Normal heart sounds.  Pulmonary:     Effort: Pulmonary effort is normal. No respiratory distress.     Breath sounds: Normal breath sounds. No wheezing or rales.  Abdominal:     General: Bowel sounds are normal. There is no distension.     Palpations: Abdomen is soft.     Tenderness: There is no abdominal tenderness.     Comments: Ileostomy present, stoma beefy red, surrounding skin intact, normal output  Musculoskeletal:     Cervical back: Neck supple.     Right lower leg: No edema.     Left lower leg: No edema.  Skin:    General: Skin is warm.     Capillary Refill: Capillary refill takes less than 2 seconds.  Neurological:     General: No focal deficit present.     Mental Status: She is alert.     Motor: Weakness present.     Gait: Gait abnormal.     Comments: rolator  Psychiatric:        Mood and Affect: Mood normal.     Labs reviewed: Recent Labs    03/04/23 1635 03/22/23 0958  NA 140 137  K 4.0 3.9  CL 109 110  CO2 23 21*  GLUCOSE 119* 108*  BUN 16 22  CREATININE 0.95 0.96  CALCIUM 9.0 9.6   Recent Labs    03/22/23 0958  AST 20  ALT 19  ALKPHOS 52  BILITOT 0.4  PROT 7.3  ALBUMIN 4.0   Recent Labs    03/22/23 0958  WBC 6.3  NEUTROABS 4.6  HGB 12.3  HCT 37.6  MCV 96.7  PLT 235   No results found for: "TSH" No results found for: "HGBA1C" Lab Results  Component Value Date   TRIG 77 04/20/2021  Significant Diagnostic Results in last 30 days:  No results found.  Assessment/Plan 1. Atrial fibrillation, chronic (HCC) - HR< 100 without medication - cont warfarin for clot prevention - next PT/INR 12/05  2.  Carcinoma of right breast metastatic to bone [facility]) - diagnosed about 20 years ago - recent PET scan showed mets to left L1/L5/ iliac crest and right acetabular - on Letrozole - does not want to try Ibrance or follow oncology at this time   3. Ileostomy present (HCC) - followed by ostomy nurse> last seen 12/02  4. Slow transit constipation - improved with miralax 3x/week  5. Acquired hypothyroidism - needs TSH - cont levothyroxine  6. Pain in right hip - ongoing - no recent falls - cont tylenol prn and oxycodone  7. Depression, recurrent (HCC) - adjusting well to AL - no mood changes - Na+ stable - cont Zoloft  8. Anxiety - no recent panic attacks - cont Zoloft  9. History of colon cancer - diagnosed 3 years ago - s/p subtotal colectomy with ileostomy - CEA 6.6 02/2023    Family/ staff Communication: plan discussed with patient and nurse  Labs/tests ordered: cbc/diff, cmp, TSH 06/30/2023

## 2023-06-30 DIAGNOSIS — D649 Anemia, unspecified: Secondary | ICD-10-CM | POA: Diagnosis not present

## 2023-06-30 DIAGNOSIS — R0602 Shortness of breath: Secondary | ICD-10-CM | POA: Diagnosis not present

## 2023-07-01 LAB — HEPATIC FUNCTION PANEL
ALT: 7 U/L (ref 7–35)
AST: 12 — AB (ref 13–35)
Alkaline Phosphatase: 47 (ref 25–125)
Bilirubin, Total: 0.5

## 2023-07-01 LAB — CBC AND DIFFERENTIAL
HCT: 36 (ref 36–46)
Hemoglobin: 12 (ref 12.0–16.0)
Neutrophils Absolute: 2608
Platelets: 208 10*3/uL (ref 150–400)
WBC: 4.1

## 2023-07-01 LAB — POCT INR: INR: 2.7 — AB (ref 0.80–1.20)

## 2023-07-01 LAB — TSH: TSH: 1.67 (ref 0.41–5.90)

## 2023-07-01 LAB — COMPREHENSIVE METABOLIC PANEL
Albumin: 3.7 (ref 3.5–5.0)
Calcium: 9.2 (ref 8.7–10.7)
Globulin: 2.7

## 2023-07-01 LAB — BASIC METABOLIC PANEL
BUN: 23 — AB (ref 4–21)
CO2: 25 — AB (ref 13–22)
Chloride: 108 (ref 99–108)
Creatinine: 1.1 (ref 0.5–1.1)
Glucose: 108
Potassium: 4.3 meq/L (ref 3.5–5.1)
Sodium: 138 (ref 137–147)

## 2023-07-01 LAB — CBC: RBC: 3.73 — AB (ref 3.87–5.11)

## 2023-07-11 ENCOUNTER — Encounter: Payer: Self-pay | Admitting: Orthopedic Surgery

## 2023-07-11 ENCOUNTER — Non-Acute Institutional Stay: Payer: Self-pay | Admitting: Orthopedic Surgery

## 2023-07-11 DIAGNOSIS — R110 Nausea: Secondary | ICD-10-CM

## 2023-07-11 DIAGNOSIS — K5901 Slow transit constipation: Secondary | ICD-10-CM | POA: Diagnosis not present

## 2023-07-11 DIAGNOSIS — R198 Other specified symptoms and signs involving the digestive system and abdomen: Secondary | ICD-10-CM

## 2023-07-11 NOTE — Progress Notes (Signed)
Location:   [facility] Room Number: 22-A Place of Service:  [phone]) Provider:  Hazle Nordmann, NP  PCP: Mahlon Gammon, MD  Patient Care Team: Mahlon Gammon, MD as PCP - General (Internal Medicine) Creig Hines, MD as Consulting Physician (Oncology)  Extended Emergency Contact Information Primary Emergency Contact: Marda Stalker, [place] [address] [place] Home Phone: [phone] Mobile Phone: [phone] Relation: Daughter Secondary Emergency Contact: Grattan,Darrell L Address: [address]          [place], [place] [address] [place] Home Phone: [phone] Relation: Son  Code Status:  DNR Goals of care: Advanced Directive information    07/11/2023   11:44 AM  Advanced Directives  Does Patient Have a Medical Advance Directive? Yes  Type of Advance Directive Out of facility DNR (pink MOST or yellow form)  Does patient want to make changes to medical advance directive? No - Patient declined     Chief Complaint  Patient presents with   Acute Visit    Constipation.    HPI:  Pt is a 89 y.o. female seen today for an acute visit due to constipation.   H/o colon cancer 3 years ago, s/p ileostomy after subtotal colectomy. Increased watery stool output in ileostomy x 2 days. Bowel movements irregular. Also having intermittent nausea without vomiting. Appetite has been poor since symptoms began. She reports not eating anything yesterday. In addition, she had a small amount of brown "clay-like" discharge from rectum. She was treated for similar symptoms 6 weeks ago. 10/25 KUB revealed mild to moderate feces in colon, no obstruction. Symptoms resolved with miralax BID x 2 days. Rectal discharge subsided after enema was administered. She is currently taking miralax 3x/week. Denies abdominal pain.    Past Medical History:  Diagnosis Date   Acid reflux    Anemia    Anxiety    Atrial fibrillation (HCC)    Bladder cancer  (HCC)    Breast cancer (HCC)    Cancer (HCC)    breast,bladder   Depression    Dysrhythmia    afib   GERD (gastroesophageal reflux disease)    History of hiatal hernia    HOH (hard of hearing)    aids   Hypertension    Hyperthyroidism    Hypothyroidism    Large bowel obstruction (HCC) 04/08/2021   Stroke (HCC)    2010   TIA (transient ischemic attack)    Past Surgical History:  Procedure Laterality Date   ABDOMINAL HYSTERECTOMY     ANKLE ARTHODESIS W/ ARTHROSCOPY     BLADDER SURGERY     CATARACT EXTRACTION W/PHACO Left 12/02/2014   Procedure: CATARACT EXTRACTION PHACO AND INTRAOCULAR LENS PLACEMENT (IOC);  Surgeon: Sallee Lange, MD;  Location: [facility];  Service: Ophthalmology;  Laterality: Left;  US01:45 AP%25.7 CDE42.99   CHOLECYSTECTOMY     EYE SURGERY     cataract   ILEOSTOMY CLOSURE N/A 04/15/2021   Procedure: ILEOSTOMY TAKEDOWN REVISION;  Surgeon: Campbell Lerner, MD;  Location: [facility];  Service: General;  Laterality: N/A;  not a takedown, just a revision...   INSERTION OF SUPRAPUBIC CATHETER  04/06/2021   Procedure: INSERTION OF FOLEY CATHETER;  Surgeon: Sondra Come, MD;  Location: [facility];  Service: Urology;;   JOINT REPLACEMENT     bil tkr   MASTECTOMY     partial right   REPLACEMENT TOTAL KNEE  Allergies  Allergen Reactions   Celebrex [Celecoxib] Anaphylaxis   Morphine And Codeine Swelling    Lips and mouth   Povidone-Iodine Other (See Comments)    Other reaction(s): ITCHING Other reaction(s): ITCHING    Sulfa Antibiotics     Allergies as of 07/11/2023       Reactions   Celebrex [celecoxib] Anaphylaxis   Morphine And Codeine Swelling   Lips and mouth   Povidone-iodine Other (See Comments)   Other reaction(s): ITCHING Other reaction(s): ITCHING   Sulfa Antibiotics         Medication List        Accurate as of July 11, 2023 11:44 AM. If you have any questions, ask your nurse or doctor.          STOP taking these  medications    acetaminophen 325 MG tablet Commonly known as: TYLENOL Stopped by: Sheikh Leverich E Tim Corriher   Tubersol 5 UNIT/0.1ML injection Generic drug: tuberculin Stopped by: Betzy Barbier E Shamon Cothran       TAKE these medications    ALPRAZolam 0.25 MG tablet Commonly known as: XANAX Take 1 tablet (0.25 mg total) by mouth at bedtime.   B-12 COMPLIANCE INJECTION IJ Inject 1,000 mcg as directed every 30 (thirty) days.   Calcium 600+D 600-20 MG-MCG Tabs Generic drug: Calcium Carb-Cholecalciferol Take 1 tablet by mouth in the morning and at bedtime.   letrozole 2.5 MG tablet Commonly known as: FEMARA Take 1 tablet (2.5 mg total) by mouth daily.   levothyroxine 112 MCG tablet Commonly known as: SYNTHROID Take 75 mcg by mouth daily before breakfast.   oxyCODONE 5 MG immediate release tablet Commonly known as: Roxicodone Take 0.5 tablets (2.5 mg total) by mouth every 6 (six) hours as needed for severe pain.   pantoprazole 40 MG tablet Commonly known as: PROTONIX Take 40 mg by mouth every morning.   polyethylene glycol 17 g packet Commonly known as: MIRALAX / GLYCOLAX Take 17 g by mouth as needed.   polyethylene glycol 17 g packet Commonly known as: MIRALAX / GLYCOLAX Take 17 g by mouth 3 (three) times a week.   sertraline 50 MG tablet Commonly known as: ZOLOFT Take 50 mg by mouth daily. hs   warfarin 4 MG tablet Commonly known as: COUMADIN Take 4 mg by mouth daily.        Review of Systems  Constitutional:  Positive for appetite change. Negative for fatigue.  Respiratory:  Negative for shortness of breath.   Cardiovascular:  Negative for chest pain.  Gastrointestinal:  Positive for constipation, diarrhea and nausea. Negative for abdominal distention, abdominal pain, anal bleeding, blood in stool, rectal pain and vomiting.  Genitourinary:  Negative for dysuria.  Musculoskeletal:  Positive for gait problem.  Neurological:  Negative for weakness.  Psychiatric/Behavioral:  Negative  for dysphoric mood. The patient is not nervous/anxious.     Immunization History  Administered Date(s) Administered   Fluad Quad(high Dose 65+) 05/24/2023   Influenza Inj Mdck Quad Pf 05/28/2022   Influenza, High Dose Seasonal PF 05/30/2018, 05/31/2019, 05/26/2020   Influenza-Unspecified 05/30/2014, 06/09/2015, 04/25/2021   PFIZER Comirnaty(Gray Top)Covid-19 Tri-Sucrose Vaccine 08/22/2019, 09/12/2019, 05/26/2020   Pneumococcal Conjugate-13 05/31/2016   Pneumococcal Polysaccharide-23 07/27/2003   Tdap 04/16/2016   Zoster, Live 07/27/2003   Pertinent  Health Maintenance Due  Topic Date Due   DEXA SCAN  Never done   INFLUENZA VACCINE  Completed      05/14/2021   10:00 AM 06/21/2021    4:18 PM 10/24/2021  7:29 PM 04/11/2023   10:16 AM 06/29/2023    3:25 PM  Fall Risk  Falls in the past year?    1 1  Was there an injury with Fall?    1 0  Fall Risk Category Calculator    3 1  (RETIRED) Patient Fall Risk Level High fall risk Moderate fall risk Moderate fall risk    Patient at Risk for Falls Due to    History of fall(s);Impaired balance/gait;Impaired mobility History of fall(s);Impaired balance/gait;Impaired mobility  Fall risk Follow up    Falls evaluation completed;Education provided;Falls prevention discussed Falls evaluation completed;Education provided;Falls prevention discussed   Functional Status Survey:    Vitals:   07/11/23 1140  BP: 119/69  Pulse: 67  Resp: 18  Temp: 98.8 F (37.1 C)  SpO2: 97%  Weight: 131 lb 6.4 oz (59.6 kg)  Height: 5\' 3"  (1.6 m)   Body mass index is 23.28 kg/m. Physical Exam Vitals reviewed.  HENT:     Head: Normocephalic.     Mouth/Throat:     Mouth: Mucous membranes are moist.  Eyes:     General:        Right eye: No discharge.        Left eye: No discharge.  Cardiovascular:     Rate and Rhythm: Normal rate. Rhythm irregular.     Pulses: Normal pulses.     Heart sounds: Normal heart sounds.  Pulmonary:     Effort: Pulmonary  effort is normal.     Breath sounds: Normal breath sounds.  Abdominal:     General: Bowel sounds are normal. There is no distension.     Palpations: Abdomen is soft.     Tenderness: There is no abdominal tenderness.     Comments: Ileostomy with watery ouput  Musculoskeletal:     Cervical back: Neck supple.     Right lower leg: No edema.     Left lower leg: No edema.  Skin:    General: Skin is warm.     Capillary Refill: Capillary refill takes less than 2 seconds.  Neurological:     General: No focal deficit present.     Mental Status: She is alert and oriented to person, place, and time.     Gait: Gait abnormal.  Psychiatric:        Mood and Affect: Mood normal.     Labs reviewed: Recent Labs    03/04/23 1635 03/22/23 0958  NA 140 137  K 4.0 3.9  CL 109 110  CO2 23 21*  GLUCOSE 119* 108*  BUN 16 22  CREATININE 0.95 0.96  CALCIUM 9.0 9.6   Recent Labs    03/22/23 0958  AST 20  ALT 19  ALKPHOS 52  BILITOT 0.4  PROT 7.3  ALBUMIN 4.0   Recent Labs    03/22/23 0958  WBC 6.3  NEUTROABS 4.6  HGB 12.3  HCT 37.6  MCV 96.7  PLT 235   No results found for: "TSH" No results found for: "HGBA1C" Lab Results  Component Value Date   TRIG 77 04/20/2021    Significant Diagnostic Results in last 30 days:  No results found.  Assessment/Plan 1. Slow transit constipation (Primary) - ongoing - h/o ileostomy due to colon cancer 3 years ago - output has decreased and more watery - increased nausea x 2 days, no vomiting or abdominal pain  - 10/25 KUB showed moderate feces, no obstruction - will start miralax BID for today, then daily  -  encourage hydration with water - encourage movement  - recommend repeat KUB if no improvement  2. Nausea - see above - cont standard order Zofran  - encourage bland foods   3. Rectal discharge - smear size of brown discharge noted> clay/putty texture  - no blood  - 10/25 same symptoms> resolved with Enema - consider repeat  enema if symptoms return    Family/ staff Communication: plan discussed with patient and nurse  Labs/tests ordered:  none

## 2023-07-25 DIAGNOSIS — Z85038 Personal history of other malignant neoplasm of large intestine: Secondary | ICD-10-CM | POA: Diagnosis not present

## 2023-07-25 DIAGNOSIS — Z932 Ileostomy status: Secondary | ICD-10-CM | POA: Diagnosis not present

## 2023-07-29 ENCOUNTER — Other Ambulatory Visit: Payer: Self-pay | Admitting: Orthopedic Surgery

## 2023-07-29 DIAGNOSIS — F419 Anxiety disorder, unspecified: Secondary | ICD-10-CM

## 2023-07-29 MED ORDER — ALPRAZOLAM 0.25 MG PO TABS
0.2500 mg | ORAL_TABLET | Freq: Every day | ORAL | 2 refills | Status: DC
Start: 2023-07-29 — End: 2023-11-25

## 2023-08-01 DIAGNOSIS — R0602 Shortness of breath: Secondary | ICD-10-CM | POA: Diagnosis not present

## 2023-08-01 DIAGNOSIS — E039 Hypothyroidism, unspecified: Secondary | ICD-10-CM | POA: Diagnosis not present

## 2023-08-01 LAB — POCT INR: INR: 2.3 — AB (ref 0.80–1.20)

## 2023-08-10 DIAGNOSIS — Z85038 Personal history of other malignant neoplasm of large intestine: Secondary | ICD-10-CM | POA: Diagnosis not present

## 2023-08-10 DIAGNOSIS — Z932 Ileostomy status: Secondary | ICD-10-CM | POA: Diagnosis not present

## 2023-08-19 DIAGNOSIS — R791 Abnormal coagulation profile: Secondary | ICD-10-CM | POA: Diagnosis not present

## 2023-08-19 LAB — POCT INR: INR: 2.3 — AB (ref 0.80–1.20)

## 2023-08-30 DIAGNOSIS — Z85038 Personal history of other malignant neoplasm of large intestine: Secondary | ICD-10-CM | POA: Diagnosis not present

## 2023-08-30 DIAGNOSIS — Z932 Ileostomy status: Secondary | ICD-10-CM | POA: Diagnosis not present

## 2023-09-03 ENCOUNTER — Encounter: Payer: Self-pay | Admitting: Sports Medicine

## 2023-09-03 NOTE — Progress Notes (Signed)
 Received a call from the nurse that pt tested positive for Covid. Pt is asymptomatic and does not want to take paxlovid  as per nurse. Instructed nurse to monitor vitals.

## 2023-09-05 ENCOUNTER — Telehealth: Payer: Self-pay | Admitting: *Deleted

## 2023-09-05 NOTE — Telephone Encounter (Signed)
 Katie with [facility] [phone] called and stated that the patient is requesting for her Oxycodone  to be increased from 2.5mg  to 5mg  every 6 hours as needed.   Alston Jerry stated that patient has Breast Cancer with Mets and tested Positive for Covid on Saturday. Patient is complaining of Aches all over and using the Oxycodone  for that.   Please Advise

## 2023-09-07 NOTE — Telephone Encounter (Signed)
I already took care of this.

## 2023-09-08 DIAGNOSIS — Z85038 Personal history of other malignant neoplasm of large intestine: Secondary | ICD-10-CM | POA: Diagnosis not present

## 2023-09-08 DIAGNOSIS — Z932 Ileostomy status: Secondary | ICD-10-CM | POA: Diagnosis not present

## 2023-09-19 ENCOUNTER — Non-Acute Institutional Stay (SKILLED_NURSING_FACILITY): Payer: Self-pay | Admitting: Orthopedic Surgery

## 2023-09-19 ENCOUNTER — Encounter: Payer: Self-pay | Admitting: Orthopedic Surgery

## 2023-09-19 DIAGNOSIS — R051 Acute cough: Secondary | ICD-10-CM

## 2023-09-19 DIAGNOSIS — U071 COVID-19: Secondary | ICD-10-CM | POA: Diagnosis not present

## 2023-09-19 NOTE — Progress Notes (Signed)
 Location:   [facility] Room Number: 22-A Place of Service:  SNF [phone]) Provider:  Hazle Nordmann, NP  PCP: Mahlon Gammon, MD  Patient Care Team: Mahlon Gammon, MD as PCP - General (Internal Medicine) Creig Hines, MD as Consulting Physician (Oncology)  Extended Emergency Contact Information Primary Emergency Contact: Marda Stalker, [place] [address] [place] Home Phone: [phone] Mobile Phone: [phone] Relation: Daughter Secondary Emergency Contact: Kimrey,Darrell L Address: [address]          [place], [place] [address] [place] Home Phone: [phone] Relation: Son  Code Status:  DNR Goals of care: Advanced Directive information    09/19/2023    2:53 PM  Advanced Directives  Does Patient Have a Medical Advance Directive? Yes  Type of Advance Directive Living will;Out of facility DNR (pink MOST or yellow form)  Does patient want to make changes to medical advance directive? No - Patient declined     Chief Complaint  Patient presents with   Acute Visit    Cough    HPI:  Pt is a 89 y.o. female seen today for an acute visit due to cough.   She currently resides on the assisted living unit at [facility]. PMH: atrial fibrillation, HTN, stroke, hypothyroidism, right breast cancer with mets> 20 years ago s/p mastectomy, ileostomy s/p subtotal colectomy due to colon cancer> 3 years ago, bladder cancer 10 years ago, hiatal hernia, depression, anxiety and constipation.   02/08 she tested positive for covid. Symptoms included malaise. Since symptoms were mild she refused Paxlovid. She was taken off isolation precautions 02/17. Today. She c/o fatigue, dry cough and mild chest discomfort. Admits to some shortness of breath on exertion but not at rest. Nursing has given her guaifenesin syrup for cough without relief. Afebrile. Vitals stable.      Past Medical History:  Diagnosis Date   Acid reflux    Anemia     Anxiety    Atrial fibrillation (HCC)    Bladder cancer (HCC)    Breast cancer (HCC)    Cancer (HCC)    breast,bladder   Depression    Dysrhythmia    afib   GERD (gastroesophageal reflux disease)    History of hiatal hernia    HOH (hard of hearing)    aids   Hypertension    Hyperthyroidism    Hypothyroidism    Large bowel obstruction (HCC) 04/08/2021   Stroke (HCC)    2010   TIA (transient ischemic attack)    Past Surgical History:  Procedure Laterality Date   ABDOMINAL HYSTERECTOMY     ANKLE ARTHODESIS W/ ARTHROSCOPY     BLADDER SURGERY     CATARACT EXTRACTION W/PHACO Left 12/02/2014   Procedure: CATARACT EXTRACTION PHACO AND INTRAOCULAR LENS PLACEMENT (IOC);  Surgeon: Sallee Lange, MD;  Location: [facility] OR[facility];  Service: Ophthalmology;  Laterality: Left;  US01:45 AP%25.7 CDE42.99   CHOLECYSTECTOMY     EYE SURGERY     cataract   ILEOSTOMY CLOSURE N/A 04/15/2021   Procedure: ILEOSTOMY TAKEDOWN REVISION;  Surgeon: Campbell Lerner, MD;  Location: [facility] OR[facility];  Service: General;  Laterality: N/A;  not a takedown, just a revision...   INSERTION OF SUPRAPUBIC CATHETER  04/06/2021   Procedure: INSERTION OF FOLEY CATHETER;  Surgeon: Sondra Come, MD;  Location: [facility] OR[facility];  Service: Urology;;   JOINT REPLACEMENT     bil tkr  MASTECTOMY     partial right   REPLACEMENT TOTAL KNEE      Allergies  Allergen Reactions   Celebrex [Celecoxib] Anaphylaxis   Morphine And Codeine Swelling    Lips and mouth   Povidone-Iodine Other (See Comments)    Other reaction(s): ITCHING Other reaction(s): ITCHING    Sulfa Antibiotics     Allergies as of 09/19/2023       Reactions   Celebrex [celecoxib] Anaphylaxis   Morphine And Codeine Swelling   Lips and mouth   Povidone-iodine Other (See Comments)   Other reaction(s): ITCHING Other reaction(s): ITCHING   Sulfa Antibiotics         Medication List        Accurate as of September 19, 2023  2:54 PM. If you have any questions,  ask your nurse or doctor.          acetaminophen 325 MG tablet Commonly known as: TYLENOL Take 650 mg by mouth every 4 (four) hours as needed.   ALPRAZolam 0.25 MG tablet Commonly known as: Xanax Take 1 tablet (0.25 mg total) by mouth daily.   B-12 COMPLIANCE INJECTION IJ Inject 1,000 mcg as directed every 30 (thirty) days.   Calcium 600+D 600-20 MG-MCG Tabs Generic drug: Calcium Carb-Cholecalciferol Take 1 tablet by mouth in the morning and at bedtime.   DOXYCYCLINE HYCLATE PO Take 100 mg by mouth in the morning and at bedtime.   GUAIFENESIN PO Take 10 mLs by mouth every 6 (six) hours as needed.   letrozole 2.5 MG tablet Commonly known as: FEMARA Take 1 tablet (2.5 mg total) by mouth daily.   levothyroxine 75 MCG tablet Commonly known as: SYNTHROID Take 75 mcg by mouth daily. No food 2 hours prior to bedtime. What changed: Another medication with the same name was removed. Continue taking this medication, and follow the directions you see here. Changed by: Octavia Heir   oxyCODONE 5 MG immediate release tablet Commonly known as: Roxicodone Take 0.5 tablets (2.5 mg total) by mouth every 6 (six) hours as needed for severe pain.   pantoprazole 40 MG tablet Commonly known as: PROTONIX Take 40 mg by mouth every morning.   polyethylene glycol 17 g packet Commonly known as: MIRALAX / GLYCOLAX Take 17 g by mouth daily in the afternoon.   polyethylene glycol 17 g packet Commonly known as: MIRALAX / GLYCOLAX Take 17 g by mouth as needed.   sertraline 50 MG tablet Commonly known as: ZOLOFT Take 50 mg by mouth daily. hs   WARFARIN SODIUM PO Take 3.5 mg by mouth daily. What changed: Another medication with the same name was removed. Continue taking this medication, and follow the directions you see here. Changed by: Octavia Heir        Review of Systems  Constitutional:  Positive for fatigue. Negative for fever.  HENT:  Negative for sore throat and trouble  swallowing.   Respiratory:  Positive for cough, chest tightness and shortness of breath. Negative for wheezing.   Cardiovascular:  Negative for chest pain and leg swelling.  Gastrointestinal:  Negative for abdominal distention, abdominal pain, nausea and vomiting.  Musculoskeletal:  Negative for myalgias.  Neurological:  Negative for dizziness and headaches.  Psychiatric/Behavioral:  Negative for confusion and dysphoric mood. The patient is not nervous/anxious.     Immunization History  Administered Date(s) Administered   Fluad Quad(high Dose 65+) 05/24/2023   Influenza Inj Mdck Quad Pf 05/28/2022   Influenza, High Dose Seasonal PF 05/30/2018, 05/31/2019,  05/26/2020   Influenza-Unspecified 05/30/2014, 06/09/2015, 04/25/2021   PFIZER Comirnaty(Gray Top)Covid-19 Tri-Sucrose Vaccine 08/22/2019, 09/12/2019, 05/26/2020   Pneumococcal Conjugate-13 05/31/2016   Pneumococcal Polysaccharide-23 07/27/2003   Tdap 04/16/2016   Zoster, Live 07/27/2003   Pertinent  Health Maintenance Due  Topic Date Due   DEXA SCAN  Never done   INFLUENZA VACCINE  Completed      06/21/2021    4:18 PM 10/24/2021    7:29 PM 04/11/2023   10:16 AM 06/29/2023    3:25 PM 07/11/2023    2:27 PM  Fall Risk  Falls in the past year?   1 1 1   Was there an injury with Fall?   1 0 0  Fall Risk Category Calculator   3 1 1   (RETIRED) Patient Fall Risk Level Moderate fall risk Moderate fall risk     Patient at Risk for Falls Due to   History of fall(s);Impaired balance/gait;Impaired mobility History of fall(s);Impaired balance/gait;Impaired mobility History of fall(s);Impaired balance/gait;Impaired mobility  Fall risk Follow up   Falls evaluation completed;Education provided;Falls prevention discussed Falls evaluation completed;Education provided;Falls prevention discussed Falls evaluation completed;Education provided   Functional Status Survey:    Vitals:   09/19/23 1439  BP: (!) 94/58  Pulse: 80  Resp: 19  Temp: 98.1  F (36.7 C)  SpO2: 93%  Weight: 132 lb 12.8 oz (60.2 kg)  Height: 5\' 3"  (1.6 m)   Body mass index is 23.52 kg/m. Physical Exam Vitals reviewed.  Constitutional:      General: She is not in acute distress. HENT:     Head: Normocephalic.     Right Ear: Tympanic membrane normal.     Left Ear: Tympanic membrane normal.     Nose: Nose normal.     Mouth/Throat:     Mouth: Mucous membranes are moist.     Pharynx: No posterior oropharyngeal erythema.  Eyes:     General:        Right eye: No discharge.        Left eye: No discharge.  Cardiovascular:     Rate and Rhythm: Normal rate and regular rhythm.     Pulses: Normal pulses.     Heart sounds: Normal heart sounds.  Pulmonary:     Effort: Pulmonary effort is normal. No respiratory distress.     Breath sounds: Examination of the right-middle field reveals decreased breath sounds. Examination of the right-lower field reveals decreased breath sounds. Decreased breath sounds present. No wheezing, rhonchi or rales.  Abdominal:     General: Bowel sounds are normal.     Palpations: Abdomen is soft.  Musculoskeletal:     Cervical back: Neck supple.     Right lower leg: No edema.     Left lower leg: No edema.  Lymphadenopathy:     Cervical: No cervical adenopathy.  Skin:    General: Skin is warm.     Capillary Refill: Capillary refill takes less than 2 seconds.  Neurological:     General: No focal deficit present.     Mental Status: She is alert and oriented to person, place, and time.     Motor: Weakness present.     Gait: Gait abnormal.  Psychiatric:        Mood and Affect: Mood normal.     Labs reviewed: Recent Labs    03/04/23 1635 03/22/23 0958  NA 140 137  K 4.0 3.9  CL 109 110  CO2 23 21*  GLUCOSE 119* 108*  BUN 16 22  CREATININE 0.95 0.96  CALCIUM 9.0 9.6   Recent Labs    03/22/23 0958  AST 20  ALT 19  ALKPHOS 52  BILITOT 0.4  PROT 7.3  ALBUMIN 4.0   Recent Labs    03/22/23 0958  WBC 6.3   NEUTROABS 4.6  HGB 12.3  HCT 37.6  MCV 96.7  PLT 235   No results found for: "TSH" No results found for: "HGBA1C" Lab Results  Component Value Date   TRIG 77 04/20/2021    Significant Diagnostic Results in last 30 days:  No results found.  Assessment/Plan 1. Acute cough (Primary) - recent covid 02/08 - dry cough, chest discomfort, mild sob with exertion - right middle/lower lobe diminished - CXR to r/o PNA - start doxycycline 100 mg po BID x 7 days  2. COVID-19 - tested positive 02/08 - declined paxlovid due to mild symptoms    Family/ staff Communication: plan discussed with patient and nurse  Labs/tests ordered: CXR

## 2023-09-20 DIAGNOSIS — I517 Cardiomegaly: Secondary | ICD-10-CM | POA: Diagnosis not present

## 2023-09-20 DIAGNOSIS — J90 Pleural effusion, not elsewhere classified: Secondary | ICD-10-CM | POA: Diagnosis not present

## 2023-09-20 DIAGNOSIS — J9811 Atelectasis: Secondary | ICD-10-CM | POA: Diagnosis not present

## 2023-09-20 DIAGNOSIS — R918 Other nonspecific abnormal finding of lung field: Secondary | ICD-10-CM | POA: Diagnosis not present

## 2023-10-04 ENCOUNTER — Non-Acute Institutional Stay: Payer: Self-pay | Admitting: Adult Health

## 2023-10-04 ENCOUNTER — Encounter: Payer: Self-pay | Admitting: Adult Health

## 2023-10-04 DIAGNOSIS — R109 Unspecified abdominal pain: Secondary | ICD-10-CM

## 2023-10-04 DIAGNOSIS — C50911 Malignant neoplasm of unspecified site of right female breast: Secondary | ICD-10-CM

## 2023-10-04 DIAGNOSIS — Z432 Encounter for attention to ileostomy: Secondary | ICD-10-CM

## 2023-10-04 DIAGNOSIS — C7951 Secondary malignant neoplasm of bone: Secondary | ICD-10-CM

## 2023-10-04 NOTE — Progress Notes (Signed)
 Location:  [facility] Room Number: 22-A Place of Service:  ALF [phone]) Provider:  Kenard Gower, DNP, FNP-BC  Patient Care Team: Mahlon Gammon, MD as PCP - General (Internal Medicine) Creig Hines, MD as Consulting Physician (Oncology)  Extended Emergency Contact Information Primary Emergency Contact: Marda Stalker, [place] [address] [place] Home Phone: [phone] Mobile Phone: [phone] Relation: Daughter Secondary Emergency Contact: Chance,Darrell L Address: [address]          [place], [place] [address] [place] Home Phone: [phone] Relation: Son  Code Status:   DNR  Goals of care: Advanced Directive information    09/19/2023    2:53 PM  Advanced Directives  Does Patient Have a Medical Advance Directive? Yes  Type of Advance Directive Living will;Out of facility DNR (pink MOST or yellow form)  Does patient want to make changes to medical advance directive? No - Patient declined     Chief Complaint  Patient presents with   Acute Visit    Abdominal pain    HPI:  Pt is a 89 y.o. [name] seen today for an acute visit regarding abdominal pain. She is a resident of [facility] [address] [facility]. She has a PMH of breast cancer  with metastasis, stroke, hypothyroidism, atrial fibrillation on Coumadin. She complained of abdominal pain, 5/10, on the right side of her ileostomy, pain started this morning. She usually complains of pain on her hips and back, thought to be due to cancer bone metastasis. She takes Oxycodone 2.5 mg Q 6 hours PRN for pain. She has ileostomy which she reports having normal amount of stool output. Bowel sounds are normoactive. She reports having abdominal hernia and is noticeable only when she stands up, area is tender to touch.    Past Medical History:  Diagnosis Date   Acid reflux    Anemia    Anxiety    Atrial fibrillation (HCC)    Bladder cancer (HCC)    Breast cancer (HCC)     Cancer (HCC)    breast,bladder   Depression    Dysrhythmia    afib   GERD (gastroesophageal reflux disease)    History of hiatal hernia    HOH (hard of hearing)    aids   Hypertension    Hyperthyroidism    Hypothyroidism    Large bowel obstruction (HCC) 04/08/2021   Stroke (HCC)    2010   TIA (transient ischemic attack)    Past Surgical History:  Procedure Laterality Date   ABDOMINAL HYSTERECTOMY     ANKLE ARTHODESIS W/ ARTHROSCOPY     BLADDER SURGERY     CATARACT EXTRACTION W/PHACO Left 12/02/2014   Procedure: CATARACT EXTRACTION PHACO AND INTRAOCULAR LENS PLACEMENT (IOC);  Surgeon: Sallee Lange, MD;  Location: [facility] ORS;  Service: Ophthalmology;  Laterality: Left;  US01:45 AP%25.7 CDE42.99   CHOLECYSTECTOMY     EYE SURGERY     cataract   ILEOSTOMY CLOSURE N/A 04/15/2021   Procedure: ILEOSTOMY TAKEDOWN REVISION;  Surgeon: Campbell Lerner, MD;  Location: [facility] ORS;  Service: General;  Laterality: N/A;  not a takedown, just a revision...   INSERTION OF SUPRAPUBIC CATHETER  04/06/2021   Procedure: INSERTION OF FOLEY CATHETER;  Surgeon: Sondra Come, MD;  Location: [facility] ORS;  Service: Urology;;   JOINT REPLACEMENT     bil tkr   MASTECTOMY     partial right   REPLACEMENT TOTAL KNEE  Allergies  Allergen Reactions   Celebrex [Celecoxib] Anaphylaxis   Morphine And Codeine Swelling    Lips and mouth   Povidone-Iodine Other (See Comments)    Other reaction(s): ITCHING Other reaction(s): ITCHING    Sulfa Antibiotics     Outpatient Encounter Medications as of 10/04/2023  Medication Sig   acetaminophen (TYLENOL) 325 MG tablet Take 650 mg by mouth every 4 (four) hours as needed.   ALPRAZolam (XANAX) 0.25 MG tablet Take 1 tablet (0.25 mg total) by mouth daily.   Calcium Carb-Cholecalciferol (CALCIUM 600+D) 600-20 MG-MCG TABS Take 1 tablet by mouth in the morning and at bedtime.   Cyanocobalamin (B-12 COMPLIANCE INJECTION IJ) Inject 1,000 mcg as directed every 30  (thirty) days.   DOXYCYCLINE HYCLATE PO Take 100 mg by mouth in the morning and at bedtime.   GUAIFENESIN PO Take 10 mLs by mouth every 6 (six) hours as needed.   letrozole (FEMARA) 2.5 MG tablet Take 1 tablet (2.5 mg total) by mouth daily.   levothyroxine (SYNTHROID) Gabriela MCG tablet Take Gabriela mcg by mouth daily. No food 2 hours prior to bedtime.   oxyCODONE (ROXICODONE) 5 MG immediate release tablet Take 0.5 tablets (2.5 mg total) by mouth every 6 (six) hours as needed for severe pain.   pantoprazole (PROTONIX) 40 MG tablet Take 40 mg by mouth every morning.   polyethylene glycol (MIRALAX / GLYCOLAX) 17 g packet Take 17 g by mouth daily in the afternoon.   polyethylene glycol (MIRALAX / GLYCOLAX) 17 g packet Take 17 g by mouth as needed.   sertraline (ZOLOFT) 50 MG tablet Take 50 mg by mouth daily. hs   WARFARIN SODIUM PO Take 3.5 mg by mouth daily.   No facility-administered encounter medications on file as of 10/04/2023.    Review of Systems  Constitutional:  Negative for appetite change, chills, fatigue and fever.  HENT:  Negative for congestion, hearing loss, rhinorrhea and sore throat.   Eyes: Negative.   Respiratory:  Negative for cough, shortness of breath and wheezing.   Cardiovascular:  Negative for chest pain, palpitations and leg swelling.  Gastrointestinal:  Positive for abdominal pain. Negative for constipation, diarrhea, nausea and vomiting.  Genitourinary:  Negative for dysuria.  Musculoskeletal:  Negative for arthralgias, back pain and myalgias.  Skin:  Negative for color change, rash and wound.  Neurological:  Negative for dizziness, weakness and headaches.  Psychiatric/Behavioral:  Negative for behavioral problems. The patient is not nervous/anxious.       Immunization History  Administered Date(s) Administered   Fluad Quad(high Dose 65+) 05/24/2023   Influenza Inj Mdck Quad Pf 05/28/2022   Influenza, High Dose Seasonal PF 05/30/2018, 05/31/2019, 05/26/2020    Influenza-Unspecified 05/30/2014, 06/09/2015, 04/25/2021   PFIZER Comirnaty(Gray Top)Covid-19 Tri-Sucrose Vaccine 08/22/2019, 09/12/2019, 05/26/2020   Pneumococcal Conjugate-13 05/31/2016   Pneumococcal Polysaccharide-23 07/27/2003   Tdap 04/16/2016   Zoster, Live 07/27/2003   Pertinent  Health Maintenance Due  Topic Date Due   DEXA SCAN  Never done   INFLUENZA VACCINE  Completed      10/24/2021    7:29 PM 04/11/2023   10:16 AM 06/29/2023    3:25 PM 07/11/2023    2:27 PM 09/19/2023    7:22 PM  Fall Risk  Falls in the past year?  1 1 1 1   Was there an injury with Fall?  1 0 0 0  Fall Risk Category Calculator  3 1 1 1   (RETIRED) Patient Fall Risk Level Moderate fall risk  Patient at Risk for Falls Due to  History of fall(s);Impaired balance/gait;Impaired mobility History of fall(s);Impaired balance/gait;Impaired mobility History of fall(s);Impaired balance/gait;Impaired mobility History of fall(s);Impaired balance/gait  Fall risk Follow up  Falls evaluation completed;Education provided;Falls prevention discussed Falls evaluation completed;Education provided;Falls prevention discussed Falls evaluation completed;Education provided Falls evaluation completed;Education provided     Vitals:   10/04/23 1512  BP: 98/60  Pulse: 74  Resp: 18  Temp: (!) 97.1 F (36.2 C)  SpO2: 97%  Weight: 128 lb (58.1 kg)  Height: 5' (1.524 m)   Body mass index is 25 kg/m.  Physical Exam Constitutional:      Appearance: Normal appearance.  HENT:     Head: Normocephalic and atraumatic.     Nose: Nose normal.     Mouth/Throat:     Mouth: Mucous membranes are moist.  Eyes:     Conjunctiva/sclera: Conjunctivae normal.  Cardiovascular:     Rate and Rhythm: Normal rate. Rhythm irregular.  Pulmonary:     Effort: Pulmonary effort is normal.     Breath sounds: Normal breath sounds.  Abdominal:     General: Bowel sounds are normal.     Palpations: Abdomen is soft.     Comments: Has ileostomy   Musculoskeletal:        General: Normal range of motion.     Cervical back: Normal range of motion.  Skin:    General: Skin is warm and dry.  Neurological:     Mental Status: She is alert.  Psychiatric:        Mood and Affect: Mood normal.        Behavior: Behavior normal.       Labs reviewed: Recent Labs    03/04/23 1635 03/22/23 0958 07/01/23 0000  NA 140 137 138  K 4.0 3.9 4.3  CL 109 110 108  CO2 23 21* 25*  GLUCOSE 119* 108*  --   BUN 16 22 23*  CREATININE 0.95 0.96 1.1  CALCIUM 9.0 9.6 9.2   Recent Labs    03/22/23 0958 07/01/23 0000  AST 20 12*  ALT 19 7  ALKPHOS 52 47  BILITOT 0.4  --   PROT 7.3  --   ALBUMIN 4.0 3.7   Recent Labs    03/22/23 0958 07/01/23 0000  WBC 6.3 4.1  NEUTROABS 4.6 2,608.00  HGB 12.3 12.0  HCT 37.6 36  MCV 96.7  --   PLT 235 208   Lab Results  Component Value Date   TSH 1.67 07/01/2023   No results found for: "HGBA1C" Lab Results  Component Value Date   TRIG 77 04/20/2021    Significant Diagnostic Results in last 30 days:  No results found.  Assessment/Plan  1. Abdominal pain, unspecified abdominal location (Primary) -  will do abdominal ultrasound to assess for possible complication of hernia -  continue Oxycodone PRN  2. 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 Ileostomy care [facility]) -  ileostomy care daily    Family/ staff Communication: Discussed plan of care with resident and charge nurse  Labs/tests ordered: ultrasound of abdomen    Kenard Gower, DNP, MSN, FNP-BC [facility] [phone] (Monday-Friday 8:00 a.m. - 5:00 p.m.) [phone] (after hours)

## 2023-10-05 DIAGNOSIS — K562 Volvulus: Secondary | ICD-10-CM | POA: Diagnosis not present

## 2023-10-05 DIAGNOSIS — K469 Unspecified abdominal hernia without obstruction or gangrene: Secondary | ICD-10-CM | POA: Diagnosis not present

## 2023-10-06 ENCOUNTER — Non-Acute Institutional Stay: Payer: Self-pay | Admitting: Internal Medicine

## 2023-10-06 DIAGNOSIS — K625 Hemorrhage of anus and rectum: Secondary | ICD-10-CM | POA: Diagnosis not present

## 2023-10-06 DIAGNOSIS — R791 Abnormal coagulation profile: Secondary | ICD-10-CM | POA: Diagnosis not present

## 2023-10-06 DIAGNOSIS — I482 Chronic atrial fibrillation, unspecified: Secondary | ICD-10-CM

## 2023-10-06 DIAGNOSIS — D649 Anemia, unspecified: Secondary | ICD-10-CM | POA: Diagnosis not present

## 2023-10-06 DIAGNOSIS — C50911 Malignant neoplasm of unspecified site of right female breast: Secondary | ICD-10-CM

## 2023-10-06 DIAGNOSIS — C7951 Secondary malignant neoplasm of bone: Secondary | ICD-10-CM | POA: Diagnosis not present

## 2023-10-06 DIAGNOSIS — Z932 Ileostomy status: Secondary | ICD-10-CM | POA: Diagnosis not present

## 2023-10-06 LAB — CBC AND DIFFERENTIAL
HCT: 34 — AB (ref 36–46)
Hemoglobin: 11.1 — AB (ref 12.0–16.0)
Platelets: 192 10*3/uL (ref 150–400)
WBC: 6.1

## 2023-10-06 LAB — PROTIME-INR: Protime: 47 — AB (ref 10.0–13.8)

## 2023-10-06 LAB — POCT INR: INR: 5 — AB (ref 0.80–1.20)

## 2023-10-06 LAB — CBC: RBC: 3.52 — AB (ref 3.87–5.11)

## 2023-10-06 NOTE — Progress Notes (Unsigned)
 Location: Medical illustrator of Service:  SNF (31)  Provider:   Code Status: DNR Goals of Care:     09/19/2023    2:53 PM  Advanced Directives  Does Patient Have a Medical Advance Directive? Yes  Type of Advance Directive Living will;Out of facility DNR (pink MOST or yellow form)  Does patient want to make changes to medical advance directive? No - Patient declined     Chief Complaint  Patient presents with   Acute Visit    HPI: Patient is a 89 y.o. female seen today for an acute visit for Rectal Bleeding  Patient lives in AL in [facility]   Diagnosis of metastatic breast cancer PET scan done has shown multifocal osseous metastasis including in the left L1, left L5 and iliac crest and right Acetabular Patient also has a history of A-fib.  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 But her pain got resolved.  But this morning she noticed blood when she went to move her bowels.  She states there was no stool.  Just a lot of blood from her rectal area.  Since then she has had 2 more episodes but a small amount of blood.   She denies any abdominal pain nausea or vomiting her bowels are moving well in her Ileostomy Bag She did have an ultrasound done here to see her hernia.  The ultrasound shows right lower quadrant hernia with loop of bowels but no bowel infarction This was also seen in Her CT scan in 08/24  She is eating with no Nausea or vomiting Stat INR was ordered and it was 5.0  HGB 11.1  Past Medical History:  Diagnosis Date   Acid reflux    Anemia    Anxiety    Atrial fibrillation (HCC)    Bladder cancer (HCC)    Breast cancer (HCC)    Cancer (HCC)    breast,bladder   Depression    Dysrhythmia    afib   GERD (gastroesophageal reflux disease)     History of hiatal hernia    HOH (hard of hearing)    aids   Hypertension    Hyperthyroidism    Hypothyroidism    Large bowel obstruction (HCC) 04/08/2021   Stroke (HCC)    2010   TIA (transient ischemic attack)     Past Surgical History:  Procedure Laterality Date   ABDOMINAL HYSTERECTOMY     ANKLE ARTHODESIS W/ ARTHROSCOPY     BLADDER SURGERY     CATARACT EXTRACTION W/PHACO Left 12/02/2014   Procedure: CATARACT EXTRACTION PHACO AND INTRAOCULAR LENS PLACEMENT (IOC);  Surgeon: Sallee Lange, MD;  Location: [facility] ORS;  Service: Ophthalmology;  Laterality: Left;  US01:45 AP%25.7 CDE42.99   CHOLECYSTECTOMY     EYE SURGERY     cataract   ILEOSTOMY CLOSURE N/A 04/15/2021   Procedure: ILEOSTOMY TAKEDOWN REVISION;  Surgeon: Campbell Lerner, MD;  Location: [facility] ORS;  Service: General;  Laterality: N/A;  not a takedown, just a revision...   INSERTION OF SUPRAPUBIC CATHETER  04/06/2021   Procedure: INSERTION OF FOLEY CATHETER;  Surgeon: Sondra Come, MD;  Location: [facility] ORS;  Service: Urology;;   JOINT REPLACEMENT     bil tkr   MASTECTOMY     partial right   REPLACEMENT TOTAL KNEE  Allergies  Allergen Reactions   Celebrex [Celecoxib] Anaphylaxis   Morphine And Codeine Swelling    Lips and mouth   Povidone-Iodine Other (See Comments)    Other reaction(s): ITCHING Other reaction(s): ITCHING    Sulfa Antibiotics     Outpatient Encounter Medications as of 10/06/2023  Medication Sig   acetaminophen (TYLENOL) 325 MG tablet Take 650 mg by mouth every 4 (four) hours as needed.   ALPRAZolam (XANAX) 0.25 MG tablet Take 1 tablet (0.25 mg total) by mouth daily.   Calcium Carb-Cholecalciferol (CALCIUM 600+D) 600-20 MG-MCG TABS Take 1 tablet by mouth in the morning and at bedtime.   Cyanocobalamin (B-12 COMPLIANCE INJECTION IJ) Inject 1,000 mcg as directed every 30 (thirty) days.   GUAIFENESIN PO Take 10 mLs by mouth every 6 (six) hours as needed.   letrozole (FEMARA) 2.5 MG  tablet Take 1 tablet (2.5 mg total) by mouth daily.   levothyroxine (SYNTHROID) 75 MCG tablet Take 75 mcg by mouth daily. No food 2 hours prior to bedtime.   oxyCODONE (ROXICODONE) 5 MG immediate release tablet Take 0.5 tablets (2.5 mg total) by mouth every 6 (six) hours as needed for severe pain.   pantoprazole (PROTONIX) 40 MG tablet Take 40 mg by mouth every morning.   polyethylene glycol (MIRALAX / GLYCOLAX) 17 g packet Take 17 g by mouth daily in the afternoon.   polyethylene glycol (MIRALAX / GLYCOLAX) 17 g packet Take 17 g by mouth as needed.   sertraline (ZOLOFT) 50 MG tablet Take 50 mg by mouth daily. hs   WARFARIN SODIUM PO Take 3.5 mg by mouth daily.   [DISCONTINUED] DOXYCYCLINE HYCLATE PO Take 100 mg by mouth in the morning and at bedtime.   No facility-administered encounter medications on file as of 10/06/2023.    Review of Systems:  Review of Systems  Constitutional:  Negative for activity change and appetite change.  HENT: Negative.    Respiratory:  Negative for cough and shortness of breath.   Cardiovascular:  Negative for leg swelling.  Gastrointestinal:  Positive for blood in stool. Negative for constipation.  Genitourinary: Negative.   Musculoskeletal:  Negative for arthralgias, gait problem and myalgias.  Skin: Negative.   Neurological:  Negative for dizziness and weakness.  Psychiatric/Behavioral:  Negative for confusion, dysphoric mood and sleep disturbance.     Health Maintenance  Topic Date Due   Zoster Vaccines- Shingrix (1 of 2) 02/10/1951   DEXA SCAN  Never done   COVID-19 Vaccine (4 - 2024-25 season) 03/27/2023   Medicare Annual Wellness (AWV)  04/10/2024   DTaP/Tdap/Td (2 - Td or Tdap) 04/16/2026   Pneumonia Vaccine 79+ Years old  Completed   INFLUENZA VACCINE  Completed   HPV VACCINES  Aged Out    Physical Exam: Vitals:   10/07/23 0829  BP: 120/64  Pulse: 72  Resp: 20  Temp: (!) 97.4 F (36.3 C)  Weight: 128 lb (58.1 kg)   Body mass index  is 25 kg/m. Physical Exam Vitals reviewed.  Constitutional:      Appearance: Normal appearance.  HENT:     Head: Normocephalic.     Nose: Nose normal.     Mouth/Throat:     Mouth: Mucous membranes are moist.     Pharynx: Oropharynx is clear.  Eyes:     Pupils: Pupils are equal, round, and reactive to light.  Cardiovascular:     Rate and Rhythm: Normal rate. Rhythm irregular.     Pulses: Normal pulses.  Heart sounds: Normal heart sounds. No murmur heard. Pulmonary:     Effort: Pulmonary effort is normal.     Breath sounds: Normal breath sounds.  Abdominal:     General: Abdomen is flat. Bowel sounds are normal.     Palpations: Abdomen is soft.  Genitourinary:    Comments: Blood Present in her rectal area Also Has Excoriate Perianal Skin Musculoskeletal:        General: No swelling.     Cervical back: Neck supple.  Skin:    General: Skin is warm.  Neurological:     General: No focal deficit present.     Mental Status: She is alert and oriented to person, place, and time.  Psychiatric:        Mood and Affect: Mood normal.        Thought Content: Thought content normal.     Labs reviewed: Basic Metabolic Panel: Recent Labs    03/04/23 1635 03/22/23 0958 07/01/23 0000  NA 140 137 138  K 4.0 3.9 4.3  CL 109 110 108  CO2 23 21* 25*  GLUCOSE 119* 108*  --   BUN 16 22 23*  CREATININE 0.95 0.96 1.1  CALCIUM 9.0 9.6 9.2  TSH  --   --  1.67   Liver Function Tests: Recent Labs    03/22/23 0958 07/01/23 0000  AST 20 12*  ALT 19 7  ALKPHOS 52 47  BILITOT 0.4  --   PROT 7.3  --   ALBUMIN 4.0 3.7   No results for input(s): "LIPASE", "AMYLASE" in the last 8760 hours. No results for input(s): "AMMONIA" in the last 8760 hours. CBC: Recent Labs    03/22/23 0958 07/01/23 0000  WBC 6.3 4.1  NEUTROABS 4.6 2,608.00  HGB 12.3 12.0  HCT 37.6 36  MCV 96.7  --   PLT 235 208   Lipid Panel: No results for input(s): "CHOL", "HDL", "LDLCALC", "TRIG", "CHOLHDL",  "LDLDIRECT" in the last 8760 hours. No results found for: "HGBA1C"  Procedures since last visit: No results found.  Assessment/Plan 1. BRBPR (bright red blood per rectum) (Primary) Could be Sigmoid Diverticular Bleed INR was elevated as she was recently on Antibiotics Discontinued Coumadin for now Will check INR again on Sat Hgb today was 11.1  2. Ileostomy in place [facility]) No Blood there  3. 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 Atrial fibrillation, chronic (HCC) On Couamdin  Discontinued right now due to Elevated INR Restart once INR less then 2 Patient doe shave h/o Stroke when Coumadin was help in the past She does want to consider Eliquis Will talk again when her rectal bleeding stops Will Need Follow up with Cardiologist   Labs/tests ordered:  Repeat CBC and INR Next appt:  Visit date not found

## 2023-10-07 ENCOUNTER — Encounter: Payer: Self-pay | Admitting: Internal Medicine

## 2023-10-08 DIAGNOSIS — Z7901 Long term (current) use of anticoagulants: Secondary | ICD-10-CM | POA: Diagnosis not present

## 2023-10-08 LAB — POCT INR: INR: 2.2 — AB (ref 0.80–1.20)

## 2023-10-08 LAB — PROTIME-INR: Protime: 22.4 — AB (ref 10.0–13.8)

## 2023-10-09 ENCOUNTER — Telehealth: Payer: Self-pay | Admitting: Internal Medicine

## 2023-10-09 NOTE — Telephone Encounter (Signed)
 Patient INR is down to 2.2 today Will restart her Coumadin same dose from tomorrow. She did have some Rectal Bleeding but mostly has been doing well. Check INR in 2 weeks

## 2023-10-10 DIAGNOSIS — D649 Anemia, unspecified: Secondary | ICD-10-CM | POA: Diagnosis not present

## 2023-10-10 LAB — CBC AND DIFFERENTIAL
HCT: 32 — AB (ref 36–46)
Hemoglobin: 10.5 — AB (ref 12.0–16.0)
Platelets: 213 10*3/uL (ref 150–400)
WBC: 4.7

## 2023-10-10 LAB — CBC: RBC: 3.41 — AB (ref 3.87–5.11)

## 2023-10-11 ENCOUNTER — Telehealth: Payer: Self-pay | Admitting: Orthopedic Surgery

## 2023-10-11 NOTE — Telephone Encounter (Signed)
 H/o metastatic breast cancer, colectomy, ileostomy. 03/13 BRBPR. 03/17 she had another episode. Stat cbc/diff> hgb 10.5. She is on coumadin due to A-fib and past CVA. Coumadin was held for a few days, but restarted 03/16 due to INR 2.2. She is not following oncology or GI at this time. Recommend ED evaluation if another episode of rectal bleeding occurs.

## 2023-10-12 ENCOUNTER — Other Ambulatory Visit: Payer: Self-pay

## 2023-10-12 ENCOUNTER — Emergency Department (HOSPITAL_COMMUNITY)
Admission: EM | Admit: 2023-10-12 | Discharge: 2023-10-12 | Disposition: A | Discharge status: Home / Self Care | Attending: Emergency Medicine | Admitting: Emergency Medicine

## 2023-10-12 ENCOUNTER — Encounter: Payer: Self-pay | Admitting: Orthopedic Surgery

## 2023-10-12 ENCOUNTER — Non-Acute Institutional Stay: Payer: Self-pay | Admitting: Orthopedic Surgery

## 2023-10-12 DIAGNOSIS — K625 Hemorrhage of anus and rectum: Secondary | ICD-10-CM | POA: Diagnosis not present

## 2023-10-12 DIAGNOSIS — Z853 Personal history of malignant neoplasm of breast: Secondary | ICD-10-CM | POA: Diagnosis not present

## 2023-10-12 DIAGNOSIS — I10 Essential (primary) hypertension: Secondary | ICD-10-CM | POA: Diagnosis not present

## 2023-10-12 DIAGNOSIS — Z79890 Hormone replacement therapy (postmenopausal): Secondary | ICD-10-CM | POA: Insufficient documentation

## 2023-10-12 DIAGNOSIS — Z79899 Other long term (current) drug therapy: Secondary | ICD-10-CM | POA: Diagnosis not present

## 2023-10-12 DIAGNOSIS — Z8551 Personal history of malignant neoplasm of bladder: Secondary | ICD-10-CM | POA: Diagnosis not present

## 2023-10-12 DIAGNOSIS — Z7901 Long term (current) use of anticoagulants: Secondary | ICD-10-CM | POA: Insufficient documentation

## 2023-10-12 DIAGNOSIS — Z8673 Personal history of transient ischemic attack (TIA), and cerebral infarction without residual deficits: Secondary | ICD-10-CM | POA: Insufficient documentation

## 2023-10-12 DIAGNOSIS — K6289 Other specified diseases of anus and rectum: Secondary | ICD-10-CM | POA: Diagnosis present

## 2023-10-12 DIAGNOSIS — E039 Hypothyroidism, unspecified: Secondary | ICD-10-CM | POA: Diagnosis not present

## 2023-10-12 LAB — COMPREHENSIVE METABOLIC PANEL
ALT: 14 U/L (ref 0–44)
AST: 18 U/L (ref 15–41)
Albumin: 3.5 g/dL (ref 3.5–5.0)
Alkaline Phosphatase: 51 U/L (ref 38–126)
Anion gap: 10 (ref 5–15)
BUN: 20 mg/dL (ref 8–23)
CO2: 22 mmol/L (ref 22–32)
Calcium: 9.2 mg/dL (ref 8.9–10.3)
Chloride: 106 mmol/L (ref 98–111)
Creatinine, Ser: 1.11 mg/dL — ABNORMAL HIGH (ref 0.44–1.00)
GFR, Estimated: 48 mL/min — ABNORMAL LOW (ref >60)
Glucose, Bld: 92 mg/dL (ref 70–99)
Potassium: 4.1 mmol/L (ref 3.5–5.1)
Sodium: 138 mmol/L (ref 135–145)
Total Bilirubin: 0.6 mg/dL (ref 0.0–1.2)
Total Protein: 7.1 g/dL (ref 6.5–8.1)

## 2023-10-12 LAB — CBC WITH DIFFERENTIAL/PLATELET
Abs Immature Granulocytes: 0.03 10*3/uL (ref 0.00–0.07)
Basophils Absolute: 0 10*3/uL (ref 0.0–0.1)
Basophils Relative: 1 %
Eosinophils Absolute: 0.1 10*3/uL (ref 0.0–0.5)
Eosinophils Relative: 2 %
HCT: 34.4 % — ABNORMAL LOW (ref 36.0–46.0)
Hemoglobin: 11.1 g/dL — ABNORMAL LOW (ref 12.0–15.0)
Immature Granulocytes: 1 %
Lymphocytes Relative: 18 %
Lymphs Abs: 1 10*3/uL (ref 0.7–4.0)
MCH: 31.4 pg (ref 26.0–34.0)
MCHC: 32.3 g/dL (ref 30.0–36.0)
MCV: 97.2 fL (ref 80.0–100.0)
Monocytes Absolute: 0.6 10*3/uL (ref 0.1–1.0)
Monocytes Relative: 12 %
Neutro Abs: 3.7 10*3/uL (ref 1.7–7.7)
Neutrophils Relative %: 66 %
Platelets: 224 10*3/uL (ref 150–400)
RBC: 3.54 MIL/uL — ABNORMAL LOW (ref 3.87–5.11)
RDW: 13.6 % (ref 11.5–15.5)
WBC: 5.5 10*3/uL (ref 4.0–10.5)
nRBC: 0 % (ref 0.0–0.2)

## 2023-10-12 LAB — TYPE AND SCREEN
ABO/RH(D): A POS
Antibody Screen: NEGATIVE

## 2023-10-12 LAB — PROTIME-INR
INR: 1.3 — ABNORMAL HIGH (ref 0.8–1.2)
Prothrombin Time: 16.2 s — ABNORMAL HIGH (ref 11.4–15.2)

## 2023-10-12 LAB — POC OCCULT BLOOD, ED: Fecal Occult Bld: POSITIVE — AB

## 2023-10-12 MED ORDER — HYDROCORTISONE ACETATE 25 MG RE SUPP: 25.0000 mg | Freq: Two times a day (BID) | RECTAL | 0 refills | Status: DC

## 2023-10-12 NOTE — Discharge Instructions (Addendum)
 Use the suppositories for the next few weeks or until the bleeding stops.  If you start having any blood in your ileostomy, stomach pain, fevers or other concerns like lightheadedness or dizziness return to the emergency room.  Your blood counts today look normal at 11.

## 2023-10-12 NOTE — ED Triage Notes (Signed)
 Pt states last Thursday she noticed bright red blood coming from her rectum that has turned to black, and is passing "hard stool looking things" to rectum, pt has ileostomy, hx colon CA

## 2023-10-12 NOTE — Progress Notes (Signed)
 Location:  [facility] Room Number: 2/A Place of Service:  ALF [phone]) Provider:  Octavia Heir, NP   Mahlon Gammon, MD  Patient Care Team: Mahlon Gammon, MD as PCP - General (Internal Medicine) Creig Hines, MD as Consulting Physician (Oncology)  Extended Emergency Contact Information Primary Emergency Contact: Marda Stalker, [place] [address] [place] Home Phone: [phone] Mobile Phone: [phone] Relation: Daughter Secondary Emergency Contact: Bachand,Darrell L Address: [address]          [place], [place] [address] [place] Home Phone: [phone] Relation: Son  Code Status:  DNR Goals of care: Advanced Directive information    09/19/2023    2:53 PM  Advanced Directives  Does Patient Have a Medical Advance Directive? Yes  Type of Advance Directive Living will;Out of facility DNR (pink MOST or yellow form)  Does patient want to make changes to medical advance directive? No - Patient declined     Chief Complaint  Patient presents with   Acute Visit    Rectal bleeding    HPI:  Pt is a 89 y.o. female seen today for acute visit due to rectal bleeding.   She currently resides on the assisted living unit at [facility]. PMH: atrial fibrillation, HTN, stroke, hypothyroidism, right breast cancer with mets> 20 years ago s/p mastectomy, ileostomy s/p subtotal colectomy due to colon cancer> 3 years ago, bladder cancer 10 years ago, hiatal hernia, depression, anxiety and constipation.   H/o breast cancer with mets, ileostomy s/p subtotal colectomy due to colon cancer 3 years ago. She began to have episodes of rectal bleeding 7 days ago. 8 episodes total, including one this morning. She describes bleeding as " dark blood clots." Clots are either black or maroon in color. Last 2 episodes were 03/17 and this morning. Clots have varied in size. She is on coumadin due to h/o CVA and atrial fibrillation. 03/13 INR was 5  (recently prescribed doxycycline). Coumadin was stopped for a brief period of time. 03/16 rechecked INR 2.2, coumadin restarted. Today, she denies chest pain, sob, abdominal pain or dizziness. Ileostomy has been having normal output. She stopped seeing oncology awhile back because she didn't want to continue treatment. Treatment options discussed with daughter and patient. They would like ED evaluation at this time.   Hgb 10.5 (03/17)> was 11.1 (03/13)  Past Medical History:  Diagnosis Date   Acid reflux    Anemia    Anxiety    Atrial fibrillation (HCC)    Bladder cancer (HCC)    Breast cancer (HCC)    Cancer (HCC)    breast,bladder   Depression    Dysrhythmia    afib   GERD (gastroesophageal reflux disease)    History of hiatal hernia    HOH (hard of hearing)    aids   Hypertension    Hyperthyroidism    Hypothyroidism    Large bowel obstruction (HCC) 04/08/2021   Stroke (HCC)    2010   TIA (transient ischemic attack)    Past Surgical History:  Procedure Laterality Date   ABDOMINAL HYSTERECTOMY     ANKLE ARTHODESIS W/ ARTHROSCOPY     BLADDER SURGERY     CATARACT EXTRACTION W/PHACO Left 12/02/2014   Procedure: CATARACT EXTRACTION PHACO AND INTRAOCULAR LENS PLACEMENT (IOC);  Surgeon: Sallee Lange, MD;  Location: [facility] ORS;  Service: Ophthalmology;  Laterality: Left;  US01:45 AP%25.7 CDE42.99  CHOLECYSTECTOMY     EYE SURGERY     cataract   ILEOSTOMY CLOSURE N/A 04/15/2021   Procedure: ILEOSTOMY TAKEDOWN REVISION;  Surgeon: Campbell Lerner, MD;  Location: [facility] OR[facility];  Service: General;  Laterality: N/A;  not a takedown, just a revision...   INSERTION OF SUPRAPUBIC CATHETER  04/06/2021   Procedure: INSERTION OF FOLEY CATHETER;  Surgeon: Sondra Come, MD;  Location: [facility] ORS;  Service: Urology;;   JOINT REPLACEMENT     bil tkr   MASTECTOMY     partial right   REPLACEMENT TOTAL KNEE      Allergies  Allergen Reactions   Celebrex [Celecoxib] Anaphylaxis   Morphine  And Codeine Swelling    Lips and mouth   Povidone-Iodine Other (See Comments)    Other reaction(s): ITCHING Other reaction(s): ITCHING    Sulfa Antibiotics     Outpatient Encounter Medications as of 10/12/2023  Medication Sig   acetaminophen (TYLENOL) 325 MG tablet Take 650 mg by mouth every 4 (four) hours as needed.   ALPRAZolam (XANAX) 0.25 MG tablet Take 1 tablet (0.25 mg total) by mouth daily.   Calcium Carb-Cholecalciferol (CALCIUM 600+D) 600-20 MG-MCG TABS Take 1 tablet by mouth in the morning and at bedtime.   Cyanocobalamin (B-12 COMPLIANCE INJECTION IJ) Inject 1,000 mcg as directed every 30 (thirty) days.   GUAIFENESIN PO Take 10 mLs by mouth every 6 (six) hours as needed.   letrozole (FEMARA) 2.5 MG tablet Take 1 tablet (2.5 mg total) by mouth daily.   levothyroxine (SYNTHROID) 75 MCG tablet Take 75 mcg by mouth daily. No food 2 hours prior to bedtime.   oxyCODONE (ROXICODONE) 5 MG immediate release tablet Take 0.5 tablets (2.5 mg total) by mouth every 6 (six) hours as needed for severe pain.   pantoprazole (PROTONIX) 40 MG tablet Take 40 mg by mouth every morning.   polyethylene glycol (MIRALAX / GLYCOLAX) 17 g packet Take 17 g by mouth daily in the afternoon.   polyethylene glycol (MIRALAX / GLYCOLAX) 17 g packet Take 17 g by mouth as needed.   sertraline (ZOLOFT) 50 MG tablet Take 50 mg by mouth daily. hs   WARFARIN SODIUM PO Take 3.5 mg by mouth daily.   No facility-administered encounter medications on file as of 10/12/2023.    Review of Systems  Constitutional:  Negative for fatigue and fever.  Respiratory:  Negative for shortness of breath.   Cardiovascular:  Negative for chest pain.  Gastrointestinal:  Positive for blood in stool. Negative for abdominal pain, constipation, diarrhea, nausea, rectal pain and vomiting.  Genitourinary:  Negative for hematuria.  Musculoskeletal:  Positive for gait problem.  Skin:  Negative for wound.  Neurological:  Negative for  dizziness, weakness and light-headedness.  Psychiatric/Behavioral:  Negative for confusion and dysphoric mood. The patient is not nervous/anxious.     Immunization History  Administered Date(s) Administered   Fluad Quad(high Dose 65+) 05/24/2023   Influenza Inj Mdck Quad Pf 05/28/2022   Influenza, High Dose Seasonal PF 05/30/2018, 05/31/2019, 05/26/2020   Influenza-Unspecified 05/30/2014, 06/09/2015, 04/25/2021   PFIZER Comirnaty(Gray Top)Covid-19 Tri-Sucrose Vaccine 08/22/2019, 09/12/2019, 05/26/2020   Pneumococcal Conjugate-13 05/31/2016   Pneumococcal Polysaccharide-23 07/27/2003   Tdap 04/16/2016   Zoster, Live 07/27/2003   Pertinent  Health Maintenance Due  Topic Date Due   DEXA SCAN  Never done   INFLUENZA VACCINE  Completed      10/24/2021    7:29 PM 04/11/2023   10:16 AM 06/29/2023    3:25 PM 07/11/2023  2:27 PM 09/19/2023    7:22 PM  Fall Risk  Falls in the past year?  1 1 1 1   Was there an injury with Fall?  1 0 0 0  Fall Risk Category Calculator  3 1 1 1   (RETIRED) Patient Fall Risk Level Moderate fall risk      Patient at Risk for Falls Due to  History of fall(s);Impaired balance/gait;Impaired mobility History of fall(s);Impaired balance/gait;Impaired mobility History of fall(s);Impaired balance/gait;Impaired mobility History of fall(s);Impaired balance/gait  Fall risk Follow up  Falls evaluation completed;Education provided;Falls prevention discussed Falls evaluation completed;Education provided;Falls prevention discussed Falls evaluation completed;Education provided Falls evaluation completed;Education provided   Functional Status Survey:    Vitals:   10/12/23 1158  BP: 120/64  Pulse: 72  Resp: 20  Temp: (!) 97.4 F (36.3 C)  SpO2: 97%  Weight: 128 lb (58.1 kg)  Height: 5' (1.524 m)   Body mass index is 25 kg/m. Physical Exam Vitals reviewed.  Constitutional:      General: She is not in acute distress. HENT:     Head: Normocephalic.  Eyes:      General:        Right eye: No discharge.        Left eye: No discharge.  Cardiovascular:     Rate and Rhythm: Normal rate. Rhythm irregular.     Pulses: Normal pulses.     Heart sounds: Normal heart sounds.  Pulmonary:     Effort: Pulmonary effort is normal.     Breath sounds: Normal breath sounds.  Abdominal:     General: Bowel sounds are normal. There is no distension.     Palpations: Abdomen is soft. There is no mass.     Tenderness: There is no abdominal tenderness. There is no guarding or rebound.     Hernia: No hernia is present.     Comments: Ileostomy, stoma beefy red, normal output, surrounding skin intact  Musculoskeletal:     Cervical back: Neck supple.     Right lower leg: No edema.     Left lower leg: No edema.  Skin:    General: Skin is warm.     Capillary Refill: Capillary refill takes less than 2 seconds.  Neurological:     General: No focal deficit present.     Mental Status: She is alert and oriented to person, place, and time.     Gait: Gait abnormal.  Psychiatric:        Mood and Affect: Mood normal.     Labs reviewed: Recent Labs    03/04/23 1635 03/22/23 0958 07/01/23 0000  NA 140 137 138  K 4.0 3.9 4.3  CL 109 110 108  CO2 23 21* 25*  GLUCOSE 119* 108*  --   BUN 16 22 23*  CREATININE 0.95 0.96 1.1  CALCIUM 9.0 9.6 9.2   Recent Labs    03/22/23 0958 07/01/23 0000  AST 20 12*  ALT 19 7  ALKPHOS 52 47  BILITOT 0.4  --   PROT 7.3  --   ALBUMIN 4.0 3.7   Recent Labs    03/22/23 0958 07/01/23 0000 10/06/23 0000 10/10/23 0000  WBC 6.3 4.1 6.1 4.7  NEUTROABS 4.6 2,608.00  --   --   HGB 12.3 12.0 11.1* 10.5*  HCT 37.6 36 34* 32*  MCV 96.7  --   --   --   PLT 235 208 192 213   Lab Results  Component Value Date   TSH  1.67 07/01/2023   No results found for: "HGBA1C" Lab Results  Component Value Date   TRIG 77 04/20/2021    Significant Diagnostic Results in last 30 days:  No results found.  Assessment/Plan 1. Rectal  bleeding (Primary) - began 7 days ago - 8 episodes total> black/maroon clots - no abdominal pain/dizziness - hgb 11.1 (03/13)> now 10.5 (03/17) - on coumadin due to CVA/ atrial fib> patient states" last time it was stopped I had a stroke" - not following oncology at this time - report to ED for evaluation  Total time: 32 minutes. Greater than 50% of total time spent doing patient education regarding rectal bleeding including symptom/medication management and treatment options including goals of care.   Family/ staff Communication: plan discussed with patient and nurse  Labs/tests ordered:  ED evaluation

## 2023-10-12 NOTE — ED Provider Notes (Signed)
 [place] EMERGENCY DEPARTMENT AT [facility] Provider Note   CSN: [id number] Arrival date & time: 10/12/23  1157     History  No chief complaint on file.   Gabriela [name] is a 89 y.o. female.  Patient is an 89 year old female with a history of hypertension, stroke, hypothyroidism, atrial fibrillation on Coumadin, prior bladder and breast cancer with lytic lesions in large bowel obstruction status post ileostomy 3 years ago who is presenting today with 8 days of bleeding from her rectum.  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  Initially it started as some brighter blood but now it has just been dark about once a day.  She is not having any abdominal pain and denies any blood in her ostomy bag.  She is having no shortness of breath, dizziness or abdominal pain.  No fevers or urinary difficulties.  She had her INR checked 2 days ago and it was 2.2.  Her hemoglobin was also checked 2 days ago it was 11 but reports it was rechecked today and it was 10 and they were became concerned and sent her to the emergency room for evaluation.  She denies ever having bleeding in the past like this.  The history is provided by the patient.       Home Medications Prior to Admission medications   Medication Sig Start Date End Date Taking? Authorizing Provider  hydrocortisone (ANUSOL-HC) 25 MG suppository Place 1 suppository (25 mg total) rectally 2 (two) times daily. 10/12/23  Yes Gwyneth Sprout, MD  acetaminophen (TYLENOL) 325 MG tablet Take 650 mg by mouth every 4 (four) hours as needed.    [provider]  ALPRAZolam Prudy Feeler) 0.25 MG tablet Take 1 tablet (0.25 mg total) by mouth daily. 07/29/23   Fargo, Amy E, NP  Calcium Carb-Cholecalciferol (CALCIUM 600+D) 600-20 MG-MCG TABS Take 1 tablet by mouth in the morning and at bedtime.    [provider]  Cyanocobalamin (B-12 COMPLIANCE INJECTION IJ)  Inject 1,000 mcg as directed every 30 (thirty) days.    [provider]  GUAIFENESIN PO Take 10 mLs by mouth every 6 (six) hours as needed.    [provider]  letrozole (FEMARA) 2.5 MG tablet Take 1 tablet (2.5 mg total) by mouth daily. 03/22/23   Creig Hines, MD  levothyroxine (SYNTHROID) 75 MCG tablet Take 75 mcg by mouth daily. No food 2 hours prior to bedtime.    [provider]  oxyCODONE (ROXICODONE) 5 MG immediate release tablet Take 0.5 tablets (2.5 mg total) by mouth every 6 (six) hours as needed for severe pain. 04/07/23   Mahlon Gammon, MD  pantoprazole (PROTONIX) 40 MG tablet Take 40 mg by mouth every morning.    [provider]  polyethylene glycol (MIRALAX / GLYCOLAX) 17 g packet Take 17 g by mouth daily in the afternoon.    [provider]  polyethylene glycol (MIRALAX / GLYCOLAX) 17 g packet Take 17 g by mouth as needed.    [provider]  sertraline (ZOLOFT) 50 MG tablet Take 50 mg by mouth daily. hs    [provider]  WARFARIN SODIUM PO Take 3.5 mg by mouth daily.    [provider]      Allergies    Celebrex [celecoxib], Morphine and codeine, Povidone-iodine, and Sulfa antibiotics    Review of Systems   Review of Systems  Physical  Exam Updated Vital Signs BP 137/66 (BP Location: Left Arm)   Pulse 79   Temp 97.8 F (36.6 C) (Oral)   Resp 18   Ht 5' (1.524 m)   Wt 58.1 kg   LMP  (LMP Unknown)   SpO2 100%   BMI 25.02 kg/m  Physical Exam Vitals and nursing note reviewed.  Constitutional:      General: She is not in acute distress.    Appearance: She is well-developed.  HENT:     Head: Normocephalic and atraumatic.  Eyes:     Pupils: Pupils are equal, round, and reactive to light.  Cardiovascular:     Rate and Rhythm: Normal rate and regular rhythm.     Heart sounds: Normal heart sounds. No murmur heard.    No friction rub.  Pulmonary:     Effort: Pulmonary effort is normal.      Breath sounds: Normal breath sounds. No wheezing or rales.  Abdominal:     General: Bowel sounds are normal. There is no distension.     Palpations: Abdomen is soft.     Tenderness: There is no abdominal tenderness. There is no guarding or rebound.     Comments: Ostomy bag present in the right lower quadrant with yellow stool without any blood  Genitourinary:    Comments: Rectum with melena surrounding the rectum but no obvious hemorrhoids.  No rectal pain Musculoskeletal:        General: No tenderness. Normal range of motion.     Comments: No edema  Skin:    General: Skin is warm and dry.     Findings: No rash.  Neurological:     Mental Status: She is alert and oriented to person, place, and time.     Cranial Nerves: No cranial nerve deficit.  Psychiatric:        Behavior: Behavior normal.     ED Results / Procedures / Treatments   Labs (all labs ordered are listed, but only abnormal results are displayed) Labs Reviewed  CBC WITH DIFFERENTIAL/PLATELET - Abnormal; Notable for the following components:      Result Value   RBC 3.54 (*)    Hemoglobin 11.1 (*)    HCT 34.4 (*)    All other components within normal limits  COMPREHENSIVE METABOLIC PANEL - Abnormal; Notable for the following components:   Creatinine, Ser 1.11 (*)    GFR, Estimated 48 (*)    All other components within normal limits  PROTIME-INR - Abnormal; Notable for the following components:   Prothrombin Time 16.2 (*)    INR 1.3 (*)    All other components within normal limits  POC OCCULT BLOOD, ED - Abnormal; Notable for the following components:   Fecal Occult Bld POSITIVE (*)    All other components within normal limits  TYPE AND SCREEN    EKG None  Radiology No results found.  Procedures Procedures    Medications Ordered in ED Medications - No data to display  ED Course/ Medical Decision Making/ A&P                                 Medical Decision Making Amount and/or Complexity of Data  Reviewed Labs: ordered. Decision-making details documented in ED Course.  Risk Prescription drug management.   Patient presenting today with bleeding from her rectum but has a blind pouch as she has an ileostomy.  The stool in her ileostomy  is normal color without blood and she has been checking regularly and denies ever seeing any blood in her ostomy bag.  She is otherwise feeling well and has normal vital signs.  She has no abdominal pain.  She does have some melena on exam that is heme positive.  I independently interpreted patient's labs and CBC today with stable hemoglobin of 11 with a normal platelet count.  Her INR 2 days ago was 2.2 today INR is 1.3.  Discussed the case with Dr. Loreta Ave who is on-call for GI today as patient does not currently have a GI doctor.  She was concerned that patient may have colitis of her pouch that is causing some inflammation and bleeding.  However with stable hemoglobin and vital signs she did not feel that patient needed admission but should first try steroid suppositories and follow-up in the clinic if symptoms worsen.  This was discussed with the patient and her daughter.  They are comfortable with this plan.  Patient given return precautions.        Final Clinical Impression(s) / ED Diagnoses Final diagnoses:  Rectal bleeding    Rx / DC Orders ED Discharge Orders          Ordered    hydrocortisone (ANUSOL-HC) 25 MG suppository  2 times daily        10/12/23 1434              Gwyneth Sprout, MD 10/12/23 1443

## 2023-10-20 ENCOUNTER — Encounter: Payer: Self-pay | Admitting: Internal Medicine

## 2023-10-20 DIAGNOSIS — I10 Essential (primary) hypertension: Secondary | ICD-10-CM | POA: Diagnosis not present

## 2023-10-20 DIAGNOSIS — Z7901 Long term (current) use of anticoagulants: Secondary | ICD-10-CM | POA: Diagnosis not present

## 2023-10-20 LAB — PROTIME-INR: Protime: 20.4 — AB (ref 10.0–13.8)

## 2023-10-20 LAB — POCT INR: INR: 2 — AB (ref 0.80–1.20)

## 2023-10-20 NOTE — Progress Notes (Unsigned)
 Location:  [facility] Room Number: L22-A Place of Service:  ALF [phone]) Provider:  Valaria Good, MD  Patient Care Team: Mahlon Gammon, MD as PCP - General (Internal Medicine) Creig Hines, MD as Consulting Physician (Oncology)  Extended Emergency Contact Information Primary Emergency Contact: Marda Stalker, [place] [address] [address] [place] Home Phone: [phone] Mobile Phone: [phone] Relation: Daughter Secondary Emergency Contact: Salton,Darrell L Address: [address]          [place], [place] [address] [place] Home Phone: [phone] Relation: Son  Code Status:  DNR Goals of care: Advanced Directive information    10/20/2023   11:03 AM  Advanced Directives  Does Patient Have a Medical Advance Directive? No  Does patient want to make changes to medical advance directive? No - Patient declined  Would patient like information on creating a medical advance directive? No - Patient declined     Chief Complaint  Patient presents with   Acute Visit    CHF     HPI:  Pt is a 89 y.o. female seen today for an acute visit for    Past Medical History:  Diagnosis Date   Acid reflux    Anemia    Anxiety    Atrial fibrillation (HCC)    Bladder cancer (HCC)    Breast cancer (HCC)    Cancer (HCC)    breast,bladder   Depression    Dysrhythmia    afib   GERD (gastroesophageal reflux disease)    History of hiatal hernia    HOH (hard of hearing)    aids   Hypertension    Hyperthyroidism    Hypothyroidism    Large bowel obstruction (HCC) 04/08/2021   Stroke (HCC)    2010   TIA (transient ischemic attack)    Past Surgical History:  Procedure Laterality Date   ABDOMINAL HYSTERECTOMY     ANKLE ARTHODESIS W/ ARTHROSCOPY     BLADDER SURGERY     CATARACT EXTRACTION W/PHACO Left 12/02/2014   Procedure: CATARACT EXTRACTION PHACO AND INTRAOCULAR LENS PLACEMENT (IOC);  Surgeon: Sallee Lange,  MD;  Location: [facility];  Service: Ophthalmology;  Laterality: Left;  US01:45 AP%25.7 CDE42.99   CHOLECYSTECTOMY     EYE SURGERY     cataract   ILEOSTOMY CLOSURE N/A 04/15/2021   Procedure: ILEOSTOMY TAKEDOWN REVISION;  Surgeon: Campbell Lerner, MD;  Location: [facility];  Service: General;  Laterality: N/A;  not a takedown, just a revision...   INSERTION OF SUPRAPUBIC CATHETER  04/06/2021   Procedure: INSERTION OF FOLEY CATHETER;  Surgeon: Sondra Come, MD;  Location: [facility];  Service: Urology;;   JOINT REPLACEMENT     bil tkr   MASTECTOMY     partial right   REPLACEMENT TOTAL KNEE      Allergies  Allergen Reactions   Celebrex [Celecoxib] Anaphylaxis   Morphine And Codeine Swelling    Lips and mouth   Povidone-Iodine Other (See Comments)    Other reaction(s): ITCHING Other reaction(s): ITCHING    Sulfa Antibiotics     Outpatient Encounter Medications as of 10/20/2023  Medication Sig   acetaminophen (TYLENOL) 325 MG tablet Take 650 mg by mouth every 4 (four) hours as needed.   ALPRAZolam (XANAX) 0.25 MG tablet Take 1 tablet (0.25 mg total) by mouth daily.   Calcium Carb-Cholecalciferol (CALCIUM 600+D) 600-20 MG-MCG TABS Take 1 tablet by mouth in  the morning and at bedtime.   Cyanocobalamin (B-12 COMPLIANCE INJECTION IJ) Inject 1,000 mcg as directed every 30 (thirty) days.   GUAIFENESIN PO Take 10 mLs by mouth every 6 (six) hours as needed.   letrozole (FEMARA) 2.5 MG tablet Take 1 tablet (2.5 mg total) by mouth daily.   levothyroxine (SYNTHROID) 75 MCG tablet Take 75 mcg by mouth daily. No food 2 hours prior to bedtime.   oxyCODONE (ROXICODONE) 5 MG immediate release tablet Take 0.5 tablets (2.5 mg total) by mouth every 6 (six) hours as needed for severe pain.   pantoprazole (PROTONIX) 40 MG tablet Take 40 mg by mouth every morning.   polyethylene glycol (MIRALAX / GLYCOLAX) 17 g packet Take 17 g by mouth daily in the afternoon.   polyethylene glycol (MIRALAX / GLYCOLAX) 17  g packet Take 17 g by mouth as needed.   sertraline (ZOLOFT) 50 MG tablet Take 50 mg by mouth daily. hs   WARFARIN SODIUM PO Take 3.5 mg by mouth daily.   hydrocortisone (ANUSOL-HC) 25 MG suppository Place 1 suppository (25 mg total) rectally 2 (two) times daily. (Patient not taking: Reported on 10/20/2023)   No facility-administered encounter medications on file as of 10/20/2023.    Review of Systems  Immunization History  Administered Date(s) Administered   Fluad Quad(high Dose 65+) 05/24/2023   Influenza Inj Mdck Quad Pf 05/28/2022   Influenza, High Dose Seasonal PF 05/30/2018, 05/31/2019, 05/26/2020   Influenza-Unspecified 05/30/2014, 06/09/2015, 04/25/2021   PFIZER Comirnaty(Gray Top)Covid-19 Tri-Sucrose Vaccine 08/22/2019, 09/12/2019, 05/26/2020   Pneumococcal Conjugate-13 05/31/2016   Pneumococcal Polysaccharide-23 07/27/2003   Tdap 04/16/2016   Zoster, Live 07/27/2003   Pertinent  Health Maintenance Due  Topic Date Due   DEXA SCAN  Never done   INFLUENZA VACCINE  Completed      10/24/2021    7:29 PM 04/11/2023   10:16 AM 06/29/2023    3:25 PM 07/11/2023    2:27 PM 09/19/2023    7:22 PM  Fall Risk  Falls in the past year?  1 1 1 1   Was there an injury with Fall?  1 0 0 0  Fall Risk Category Calculator  3 1 1 1   (RETIRED) Patient Fall Risk Level Moderate fall risk      Patient at Risk for Falls Due to  History of fall(s);Impaired balance/gait;Impaired mobility History of fall(s);Impaired balance/gait;Impaired mobility History of fall(s);Impaired balance/gait;Impaired mobility History of fall(s);Impaired balance/gait  Fall risk Follow up  Falls evaluation completed;Education provided;Falls prevention discussed Falls evaluation completed;Education provided;Falls prevention discussed Falls evaluation completed;Education provided Falls evaluation completed;Education provided   Functional Status Survey:    Vitals:   10/20/23 1101  BP: 111/66  Pulse: 67  Resp: 20  Temp: (!)  97.5 F (36.4 C)  SpO2: 99%  Weight: 128 lb (58.1 kg)  Height: 5' (1.524 m)   Body mass index is 25 kg/m. Physical Exam  Labs reviewed: Recent Labs    03/04/23 1635 03/22/23 0958 07/01/23 0000 10/12/23 1217  NA 140 137 138 138  K 4.0 3.9 4.3 4.1  CL 109 110 108 106  CO2 23 21* 25* 22  GLUCOSE 119* 108*  --  92  BUN 16 22 23* 20  CREATININE 0.95 0.96 1.1 1.11*  CALCIUM 9.0 9.6 9.2 9.2   Recent Labs    03/22/23 0958 07/01/23 0000 10/12/23 1217  AST 20 12* 18  ALT 19 7 14   ALKPHOS 52 47 51  BILITOT 0.4  --  0.6  PROT  7.3  --  7.1  ALBUMIN 4.0 3.7 3.5   Recent Labs    03/22/23 0958 03/22/23 0958 07/01/23 0000 10/06/23 0000 10/10/23 0000 10/12/23 1217  WBC 6.3   < > 4.1 6.1 4.7 5.5  NEUTROABS 4.6  --  2,608.00  --   --  3.7  HGB 12.3  --  12.0 11.1* 10.5* 11.1*  HCT 37.6  --  36 34* 32* 34.4*  MCV 96.7  --   --   --   --  97.2  PLT 235  --  208 192 213 224   < > = values in this interval not displayed.   Lab Results  Component Value Date   TSH 1.67 07/01/2023   No results found for: "HGBA1C" Lab Results  Component Value Date   TRIG 77 04/20/2021    Significant Diagnostic Results in last 30 days:  No results found.  Assessment/Plan There are no diagnoses linked to this encounter.   Family/ staff Communication: ***  Labs/tests ordered:  ***

## 2023-10-28 DIAGNOSIS — I10 Essential (primary) hypertension: Secondary | ICD-10-CM | POA: Diagnosis not present

## 2023-10-28 DIAGNOSIS — M6281 Muscle weakness (generalized): Secondary | ICD-10-CM | POA: Diagnosis not present

## 2023-10-28 DIAGNOSIS — C679 Malignant neoplasm of bladder, unspecified: Secondary | ICD-10-CM | POA: Diagnosis not present

## 2023-10-31 DIAGNOSIS — Z85038 Personal history of other malignant neoplasm of large intestine: Secondary | ICD-10-CM | POA: Diagnosis not present

## 2023-10-31 DIAGNOSIS — Z932 Ileostomy status: Secondary | ICD-10-CM | POA: Diagnosis not present

## 2023-11-01 DIAGNOSIS — M6281 Muscle weakness (generalized): Secondary | ICD-10-CM | POA: Diagnosis not present

## 2023-11-01 DIAGNOSIS — I10 Essential (primary) hypertension: Secondary | ICD-10-CM | POA: Diagnosis not present

## 2023-11-01 DIAGNOSIS — C679 Malignant neoplasm of bladder, unspecified: Secondary | ICD-10-CM | POA: Diagnosis not present

## 2023-11-03 ENCOUNTER — Encounter: Payer: Self-pay | Admitting: Internal Medicine

## 2023-11-03 ENCOUNTER — Non-Acute Institutional Stay: Payer: Self-pay | Admitting: Internal Medicine

## 2023-11-03 DIAGNOSIS — R791 Abnormal coagulation profile: Secondary | ICD-10-CM | POA: Diagnosis not present

## 2023-11-03 DIAGNOSIS — R1031 Right lower quadrant pain: Secondary | ICD-10-CM | POA: Diagnosis not present

## 2023-11-03 DIAGNOSIS — Z932 Ileostomy status: Secondary | ICD-10-CM | POA: Diagnosis not present

## 2023-11-03 LAB — POCT INR: INR: 2 — AB (ref 0.80–1.20)

## 2023-11-03 LAB — PROTIME-INR: Protime: 20.1 — AB (ref 10.0–13.8)

## 2023-11-03 NOTE — Progress Notes (Signed)
 [facility] Service:  ALF (13)  Provider:   Code Status: DNR Goals of Care:     10/20/2023   11:03 AM  Advanced Directives  Does Patient Have a Medical Advance Directive? No  Does patient want to make changes to medical advance directive? No - Patient declined  Would patient like information on creating a medical advance directive? No - Patient declined     Chief Complaint  Patient presents with   Acute Visit    HPI: Patient is a 89 y.o. female seen today for an acute visit for Abdominal Discomfort Patient lives in AL in [facility]   Diagnosis of metastatic breast cancer PET scan done has shown multifocal osseous metastasis including in the left L1, left L5 and iliac crest and right Acetabular Patient also has a history of A-fib.  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 This is from previous Abdomen US She noticed last night that she had pain inher Right side of abdomen It woke her up Then she moved around and eventually lot of gas came out and she felt better . She also felt the lump in that area She is worried that she has Obstruction No nausea or Vomiting No fever Today has some discomfort but no pain  She is taking Miralax everyday  According to her Stool is Soft and runny sometimes no Constipation Past Medical History:  Diagnosis Date   Acid reflux    Anemia    Anxiety    Atrial fibrillation (HCC)    Bladder cancer (HCC)    Breast cancer (HCC)    Cancer (HCC)    breast,bladder   Depression    Dysrhythmia    afib   GERD (gastroesophageal reflux disease)    History of hiatal hernia    HOH (hard of hearing)    aids   Hypertension    Hyperthyroidism    Hypothyroidism    Large bowel obstruction (HCC) 04/08/2021   Stroke (HCC)    2010   TIA (transient ischemic attack)     Past Surgical History:  Procedure  Laterality Date   ABDOMINAL HYSTERECTOMY     ANKLE ARTHODESIS W/ ARTHROSCOPY     BLADDER SURGERY     CATARACT EXTRACTION W/PHACO Left 12/02/2014   Procedure: CATARACT EXTRACTION PHACO AND INTRAOCULAR LENS PLACEMENT (IOC);  Surgeon: Sallee Lange, MD;  Location: [facility];  Service: Ophthalmology;  Laterality: Left;  US01:45 AP%25.7 CDE42.99   CHOLECYSTECTOMY     EYE SURGERY     cataract   ILEOSTOMY CLOSURE N/A 04/15/2021   Procedure: ILEOSTOMY TAKEDOWN REVISION;  Surgeon: Campbell Lerner, MD;  Location: [facility];  Service: General;  Laterality: N/A;  not a takedown, just a revision...   INSERTION OF SUPRAPUBIC CATHETER  04/06/2021   Procedure: INSERTION OF FOLEY CATHETER;  Surgeon: Sondra Come, MD;  Location: [facility];  Service: Urology;;   JOINT REPLACEMENT     bil tkr   MASTECTOMY     partial right   REPLACEMENT TOTAL KNEE      Allergies  Allergen Reactions   Celebrex [Celecoxib] Anaphylaxis   Morphine And Codeine Swelling    Lips and mouth   Povidone-Iodine Other (See Comments)    Other reaction(s): ITCHING Other reaction(s): ITCHING    Sulfa Antibiotics     Outpatient Encounter Medications as of  11/03/2023  Medication Sig   acetaminophen (TYLENOL) 325 MG tablet Take 650 mg by mouth every 4 (four) hours as needed.   ALPRAZolam (XANAX) 0.25 MG tablet Take 1 tablet (0.25 mg total) by mouth daily.   Calcium Carb-Cholecalciferol (CALCIUM 600+D) 600-20 MG-MCG TABS Take 1 tablet by mouth in the morning and at bedtime.   Cyanocobalamin (B-12 COMPLIANCE INJECTION IJ) Inject 1,000 mcg as directed every 30 (thirty) days.   GUAIFENESIN PO Take 10 mLs by mouth every 6 (six) hours as needed.   letrozole (FEMARA) 2.5 MG tablet Take 1 tablet (2.5 mg total) by mouth daily.   levothyroxine (SYNTHROID) 75 MCG tablet Take 75 mcg by mouth daily. No food 2 hours prior to bedtime.   oxyCODONE (ROXICODONE) 5 MG immediate release tablet Take 0.5 tablets (2.5 mg total) by mouth every 6 (six)  hours as needed for severe pain.   pantoprazole (PROTONIX) 40 MG tablet Take 40 mg by mouth every morning.   polyethylene glycol (MIRALAX / GLYCOLAX) 17 g packet Take 17 g by mouth daily in the afternoon.   polyethylene glycol (MIRALAX / GLYCOLAX) 17 g packet Take 17 g by mouth as needed.   sertraline (ZOLOFT) 50 MG tablet Take 50 mg by mouth daily. hs   WARFARIN SODIUM PO Take 3.5 mg by mouth daily.   [DISCONTINUED] hydrocortisone (ANUSOL-HC) 25 MG suppository Place 1 suppository (25 mg total) rectally 2 (two) times daily. (Patient not taking: Reported on 10/20/2023)   No facility-administered encounter medications on file as of 11/03/2023.    Review of Systems:  Review of Systems  Constitutional:  Negative for activity change and appetite change.  HENT: Negative.    Respiratory:  Negative for cough and shortness of breath.   Cardiovascular:  Negative for leg swelling.  Gastrointestinal:  Positive for abdominal distention. Negative for constipation.  Genitourinary: Negative.   Musculoskeletal:  Negative for arthralgias, gait problem and myalgias.  Skin: Negative.   Neurological:  Negative for dizziness and weakness.  Psychiatric/Behavioral:  Negative for confusion, dysphoric mood and sleep disturbance.     Health Maintenance  Topic Date Due   Zoster Vaccines- Shingrix (1 of 2) 02/10/1951   DEXA SCAN  Never done   COVID-19 Vaccine (4 - 2024-25 season) 03/27/2023   INFLUENZA VACCINE  02/24/2024   Medicare Annual Wellness (AWV)  04/10/2024   DTaP/Tdap/Td (2 - Td or Tdap) 04/16/2026   Pneumonia Vaccine 40+ Years old  Completed   HPV VACCINES  Aged Out   Meningococcal B Vaccine  Aged Out    Physical Exam: Vitals:   11/03/23 1903  BP: 122/67  Pulse: 64  Resp: 18  Temp: (!) 96.9 F (36.1 C)  Weight: 133 lb 6.4 oz (60.5 kg)   Body mass index is 26.05 kg/m. Physical Exam Vitals reviewed.  Constitutional:      Appearance: Normal appearance.  HENT:     Head: Normocephalic.      Nose: Nose normal.     Mouth/Throat:     Mouth: Mucous membranes are moist.     Pharynx: Oropharynx is clear.  Eyes:     Pupils: Pupils are equal, round, and reactive to light.  Cardiovascular:     Rate and Rhythm: Normal rate. Rhythm irregular.     Pulses: Normal pulses.     Heart sounds: Normal heart sounds. No murmur heard. Pulmonary:     Effort: Pulmonary effort is normal.     Breath sounds: Normal breath sounds.  Abdominal:  General: Abdomen is flat. Bowel sounds are normal.     Palpations: Abdomen is soft.     Comments: Slight Distension in her Right side of abdomen where her Hernia is  Mild Tender but it is soft  Musculoskeletal:        General: No swelling.     Cervical back: Neck supple.  Skin:    General: Skin is warm.  Neurological:     General: No focal deficit present.     Mental Status: She is alert and oriented to person, place, and time.  Psychiatric:        Mood and Affect: Mood normal.        Thought Content: Thought content normal.     Labs reviewed: Basic Metabolic Panel: Recent Labs    03/04/23 1635 03/22/23 0958 07/01/23 0000 10/12/23 1217  NA 140 137 138 138  K 4.0 3.9 4.3 4.1  CL 109 110 108 106  CO2 23 21* 25* 22  GLUCOSE 119* 108*  --  92  BUN 16 22 23* 20  CREATININE 0.95 0.96 1.1 1.11*  CALCIUM 9.0 9.6 9.2 9.2  TSH  --   --  1.67  --    Liver Function Tests: Recent Labs    03/22/23 0958 07/01/23 0000 10/12/23 1217  AST 20 12* 18  ALT 19 7 14   ALKPHOS 52 47 51  BILITOT 0.4  --  0.6  PROT 7.3  --  7.1  ALBUMIN 4.0 3.7 3.5   No results for input(s): "LIPASE", "AMYLASE" in the last 8760 hours. No results for input(s): "AMMONIA" in the last 8760 hours. CBC: Recent Labs    03/22/23 0958 03/22/23 0958 07/01/23 0000 10/06/23 0000 10/10/23 0000 10/12/23 1217  WBC 6.3   < > 4.1 6.1 4.7 5.5  NEUTROABS 4.6  --  2,608.00  --   --  3.7  HGB 12.3  --  12.0 11.1* 10.5* 11.1*  HCT 37.6  --  36 34* 32* 34.4*  MCV 96.7  --    --   --   --  97.2  PLT 235  --  208 192 213 224   < > = values in this interval not displayed.   Lipid Panel: No results for input(s): "CHOL", "HDL", "LDLCALC", "TRIG", "CHOLHDL", "LDLDIRECT" in the last 8760 hours. No results found for: "HGBA1C"  Procedures since last visit: No results found.  Assessment/Plan 1. Right lower quadrant abdominal pain (Primary) Most likely due to Hernia I have reassured her  IF she feels distended again she should  gently try to reduce her Hernia If pain is severe then she has to go to ED Also Change her Calcium to once a day and use Gasx Prn for now   2. Ileostomy in place [facility])     Labs/tests ordered:  * No order type specified * Next appt:  Visit date not found

## 2023-11-25 ENCOUNTER — Other Ambulatory Visit: Payer: Self-pay | Admitting: Orthopedic Surgery

## 2023-11-25 DIAGNOSIS — F419 Anxiety disorder, unspecified: Secondary | ICD-10-CM

## 2023-11-25 MED ORDER — ALPRAZOLAM 0.25 MG PO TABS: 0.2500 mg | ORAL_TABLET | Freq: Every day | ORAL | 2 refills | Status: DC

## 2023-11-30 ENCOUNTER — Encounter: Payer: Self-pay | Admitting: Internal Medicine

## 2023-11-30 ENCOUNTER — Other Ambulatory Visit: Payer: Self-pay | Admitting: Orthopedic Surgery

## 2023-11-30 DIAGNOSIS — H906 Mixed conductive and sensorineural hearing loss, bilateral: Secondary | ICD-10-CM | POA: Diagnosis not present

## 2023-11-30 DIAGNOSIS — R1031 Right lower quadrant pain: Secondary | ICD-10-CM

## 2023-11-30 MED ORDER — OXYCODONE HCL 5 MG PO TABS: 2.5000 mg | ORAL_TABLET | Freq: Four times a day (QID) | ORAL | 0 refills | Status: AC | PRN

## 2023-12-01 ENCOUNTER — Encounter: Payer: Self-pay | Admitting: Internal Medicine

## 2023-12-01 ENCOUNTER — Non-Acute Institutional Stay: Payer: Self-pay | Admitting: Internal Medicine

## 2023-12-01 DIAGNOSIS — I482 Chronic atrial fibrillation, unspecified: Secondary | ICD-10-CM

## 2023-12-01 DIAGNOSIS — H6690 Otitis media, unspecified, unspecified ear: Secondary | ICD-10-CM | POA: Diagnosis not present

## 2023-12-01 DIAGNOSIS — R791 Abnormal coagulation profile: Secondary | ICD-10-CM | POA: Diagnosis not present

## 2023-12-01 LAB — PROTIME-INR: Protime: 20.6 — AB (ref 10.0–13.8)

## 2023-12-01 LAB — POCT INR: INR: 2 — AB (ref 0.80–1.20)

## 2023-12-01 NOTE — Progress Notes (Signed)
 Location:  [facility] Room Number: AL22-A Place of Service:  ALF [phone]) Provider:  Marguerite Shiley, MD   Marguerite Shiley, MD  Patient Care Team: Marguerite Shiley, MD as PCP - General (Internal Medicine) Avonne Boettcher, MD as Consulting Physician (Oncology)  Extended Emergency Contact Information Primary Emergency Contact: Tuggle,Susan          [place], [place] [address] [place] Home Phone: [phone] Mobile Phone: [phone] Relation: Daughter Secondary Emergency Contact: Prowell,Darrell L Address: [address]          [place], [place] [address] [place] Home Phone: [phone] Relation: Son  Code Status:  DNR Goals of care: Advanced Directive information    10/20/2023   11:03 AM  Advanced Directives  Does Patient Have a Medical Advance Directive? No  Does patient want to make changes to medical advance directive? No - Patient declined  Would patient like information on creating a medical advance directive? No - Patient declined     Chief Complaint  Patient presents with   Ear Pain    HPI:  Pt is a 89 y.o. female seen today for an acute visit for Right Ear Pain  Lives in [place] in [facility]  Acute issue Patient has hearing loss in both ears .  She wears hearing aid in her right ear and was seen by him hearing.  They cleaned her ear for wax but patient continued to complain of pain and some popping noise coming from that ear.  They have sent me a message saying that patient probably does have fluid behind her TM  Patient is really concerned because that is her only good ear she has a severe hearing loss on her left ear  Her daughter also wants took me to consider changing her Coumadin  to Eliquis It  seems like patient was started on Coumadin  in 2014 due to PAF.  Her last echo 2023 showed Moderate MR and TR EF normal   Patient has not seen a cardiologist recently is not established   Does not want to go out for Appointments Wants me  to make the change  Other history  Diagnosis of metastatic breast cancer PET scan done has shown multifocal osseous metastasis including in the left L1, left L5 and iliac crest and right Acetabular Patient also has a history of A-fib.  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 History of hypertension and hyperlipidemia Hiatal hernia, hypothyroidism and remote history of stroke  Past Medical History:  Diagnosis Date   Acid reflux    Anemia    Anxiety    Atrial fibrillation (HCC)    Bladder cancer (HCC)    Breast cancer (HCC)    Cancer (HCC)    breast,bladder   Depression    Dysrhythmia    afib   GERD (gastroesophageal reflux disease)    History of hiatal hernia    HOH (hard of hearing)    aids   Hypertension    Hyperthyroidism    Hypothyroidism    Large bowel obstruction (HCC) 04/08/2021   Stroke (HCC)    2010   TIA (transient ischemic attack)    Past Surgical History:  Procedure Laterality Date   ABDOMINAL HYSTERECTOMY     ANKLE ARTHODESIS W/ ARTHROSCOPY     BLADDER SURGERY     CATARACT EXTRACTION W/PHACO Left 12/02/2014   Procedure: CATARACT EXTRACTION PHACO AND INTRAOCULAR LENS PLACEMENT (IOC);  Surgeon: Steven Dingeldein,  MD;  Location: [facility];  Service: Ophthalmology;  Laterality: Left;  US01:45 AP%25.7 CDE42.99   CHOLECYSTECTOMY     EYE SURGERY     cataract   ILEOSTOMY CLOSURE N/A 04/15/2021   Procedure: ILEOSTOMY TAKEDOWN REVISION;  Surgeon: Flynn Hylan, MD;  Location: [facility];  Service: General;  Laterality: N/A;  not a takedown, just a revision...   INSERTION OF SUPRAPUBIC CATHETER  04/06/2021   Procedure: INSERTION OF FOLEY CATHETER;  Surgeon: Lawerence Pressman, MD;  Location: [facility];  Service: Urology;;   JOINT REPLACEMENT     bil tkr   MASTECTOMY     partial right   REPLACEMENT TOTAL KNEE      Allergies  Allergen Reactions   Celebrex [Celecoxib] Anaphylaxis   Morphine And Codeine Swelling    Lips and  mouth   Povidone-Iodine Other (See Comments)    Other reaction(s): ITCHING Other reaction(s): ITCHING    Sulfa Antibiotics     Outpatient Encounter Medications as of 12/01/2023  Medication Sig   acetaminophen  (TYLENOL ) 325 MG tablet Take 650 mg by mouth every 4 (four) hours as needed.   ALPRAZolam  (XANAX ) 0.25 MG tablet Take 1 tablet (0.25 mg total) by mouth at bedtime.   Calcium Carb-Cholecalciferol (CALCIUM 600+D) 600-20 MG-MCG TABS Take 1 tablet by mouth daily.   Cyanocobalamin (B-12 COMPLIANCE INJECTION IJ) Inject 1,000 mcg as directed every 30 (thirty) days.   GUAIFENESIN PO Take 10 mLs by mouth every 6 (six) hours as needed.   letrozole  (FEMARA ) 2.5 MG tablet Take 1 tablet (2.5 mg total) by mouth daily.   levothyroxine  (SYNTHROID ) 75 MCG tablet Take 75 mcg by mouth daily. No food 2 hours prior to bedtime.   oxyCODONE  (ROXICODONE ) 5 MG immediate release tablet Take 0.5 tablets (2.5 mg total) by mouth every 6 (six) hours as needed for severe pain (pain score 7-10).   pantoprazole  (PROTONIX ) 40 MG tablet Take 40 mg by mouth every morning.   polyethylene glycol (MIRALAX  / GLYCOLAX ) 17 g packet Take 17 g by mouth daily in the afternoon.   polyethylene glycol (MIRALAX  / GLYCOLAX ) 17 g packet Take 17 g by mouth as needed.   sertraline  (ZOLOFT ) 50 MG tablet Take 50 mg by mouth daily. hs   Simethicone  (GAS-X PO) Take 125 mg by mouth daily as needed.   WARFARIN SODIUM  PO Take 3.5 mg by mouth daily.   No facility-administered encounter medications on file as of 12/01/2023.    Review of Systems  Constitutional:  Negative for activity change and appetite change.  HENT:  Positive for ear pain and hearing loss.   Respiratory:  Negative for cough and shortness of breath.   Cardiovascular:  Negative for leg swelling.  Gastrointestinal:  Negative for constipation.  Genitourinary: Negative.   Musculoskeletal:  Negative for arthralgias, gait problem and myalgias.  Skin: Negative.   Neurological:   Negative for dizziness and weakness.  Psychiatric/Behavioral:  Negative for confusion, dysphoric mood and sleep disturbance.     Immunization History  Administered Date(s) Administered   Fluad Quad(high Dose 65+) 05/24/2023   Influenza Inj Mdck Quad Pf 05/28/2022   Influenza, High Dose Seasonal PF 05/30/2018, 05/31/2019, 05/26/2020   Influenza-Unspecified 05/30/2014, 06/09/2015, 04/25/2021   PFIZER Comirnaty(Gray Top)Covid-19 Tri-Sucrose Vaccine 08/22/2019, 09/12/2019, 05/26/2020   Pneumococcal Conjugate-13 05/31/2016   Pneumococcal Polysaccharide-23 07/27/2003   Tdap 04/16/2016   Zoster, Live 07/27/2003   Pertinent  Health Maintenance Due  Topic Date Due   DEXA SCAN  Never done   INFLUENZA  VACCINE  02/24/2024      10/24/2021    7:29 PM 04/11/2023   10:16 AM 06/29/2023    3:25 PM 07/11/2023    2:27 PM 09/19/2023    7:22 PM  Fall Risk  Falls in the past year?  1 1 1 1   Was there an injury with Fall?  1 0 0 0  Fall Risk Category Calculator  3 1 1 1   (RETIRED) Patient Fall Risk Level Moderate fall risk      Patient at Risk for Falls Due to  History of fall(s);Impaired balance/gait;Impaired mobility History of fall(s);Impaired balance/gait;Impaired mobility History of fall(s);Impaired balance/gait;Impaired mobility History of fall(s);Impaired balance/gait  Fall risk Follow up  Falls evaluation completed;Education provided;Falls prevention discussed Falls evaluation completed;Education provided;Falls prevention discussed Falls evaluation completed;Education provided Falls evaluation completed;Education provided   Functional Status Survey:    Vitals:   12/01/23 1009  BP: 123/83  Pulse: 73  Resp: 20  Temp: 98.7 F (37.1 C)  SpO2: 96%  Weight: 134 lb (60.8 kg)  Height: 5' (1.524 m)   Body mass index is 26.17 kg/m. Physical Exam Vitals reviewed.  Constitutional:      Appearance: Normal appearance.  HENT:     Head: Normocephalic.     Left Ear: Tympanic membrane normal.      Ears:     Comments: Dull TM in right Ear with Some Redness in the ear Canal    Nose: Nose normal.     Mouth/Throat:     Mouth: Mucous membranes are moist.     Pharynx: Oropharynx is clear.  Eyes:     Pupils: Pupils are equal, round, and reactive to light.  Cardiovascular:     Rate and Rhythm: Normal rate. Rhythm irregular.     Pulses: Normal pulses.     Heart sounds: Normal heart sounds. No murmur heard. Pulmonary:     Effort: Pulmonary effort is normal.     Breath sounds: Normal breath sounds.  Abdominal:     General: Abdomen is flat. Bowel sounds are normal.     Palpations: Abdomen is soft.  Musculoskeletal:        General: No swelling.     Cervical back: Neck supple.  Skin:    General: Skin is warm.  Neurological:     General: No focal deficit present.     Mental Status: She is alert and oriented to person, place, and time.  Psychiatric:        Mood and Affect: Mood normal.        Thought Content: Thought content normal.     Labs reviewed: Recent Labs    03/04/23 1635 03/22/23 0958 07/01/23 0000 10/12/23 1217  NA 140 137 138 138  K 4.0 3.9 4.3 4.1  CL 109 110 108 106  CO2 23 21* 25* 22  GLUCOSE 119* 108*  --  92  BUN 16 22 23* 20  CREATININE 0.95 0.96 1.1 1.11*  CALCIUM 9.0 9.6 9.2 9.2   Recent Labs    03/22/23 0958 07/01/23 0000 10/12/23 1217  AST 20 12* 18  ALT 19 7 14   ALKPHOS 52 47 51  BILITOT 0.4  --  0.6  PROT 7.3  --  7.1  ALBUMIN 4.0 3.7 3.5   Recent Labs    03/22/23 0958 03/22/23 0958 07/01/23 0000 10/06/23 0000 10/10/23 0000 10/12/23 1217  WBC 6.3   < > 4.1 6.1 4.7 5.5  NEUTROABS 4.6  --  2,608.00  --   --  3.7  HGB 12.3  --  12.0 11.1* 10.5* 11.1*  HCT 37.6  --  36 34* 32* 34.4*  MCV 96.7  --   --   --   --  97.2  PLT 235  --  208 192 213 224   < > = values in this interval not displayed.   Lab Results  Component Value Date   TSH 1.67 07/01/2023   No results found for: "HGBA1C" Lab Results  Component Value Date   TRIG 77  04/20/2021    Significant Diagnostic Results in last 30 days:  No results found.  Assessment/Plan 1. Subacute otitis media, unspecified otitis media type (Primary) Start on Keflex  500 mg TID for 7 days  2. Atrial fibrillation, chronic (HCC) On Coumadin  INR was 2.0 today Will d/w patient about making the change to Eliquis next week Checking INR again on Keflex  in few days    Family/ staff Communication:   Labs/tests ordered:

## 2023-12-05 ENCOUNTER — Encounter: Payer: Self-pay | Admitting: Orthopedic Surgery

## 2023-12-05 ENCOUNTER — Non-Acute Institutional Stay: Payer: Self-pay | Admitting: Orthopedic Surgery

## 2023-12-05 DIAGNOSIS — H6591 Unspecified nonsuppurative otitis media, right ear: Secondary | ICD-10-CM

## 2023-12-05 DIAGNOSIS — Z7901 Long term (current) use of anticoagulants: Secondary | ICD-10-CM

## 2023-12-05 LAB — PROTIME-INR: Protime: 21.1 — AB (ref 10.0–13.8)

## 2023-12-05 LAB — POCT INR: INR: 2 — AB (ref 0.80–1.20)

## 2023-12-05 NOTE — Progress Notes (Unsigned)
 Location:  [facility] Room Number: 22/A Place of Service:  ALF [phone]) Provider:  Arnetha Bhat, NP   Marguerite Shiley, MD  Patient Care Team: Marguerite Shiley, MD as PCP - General (Internal Medicine) Avonne Boettcher, MD as Consulting Physician (Oncology)  Extended Emergency Contact Information Primary Emergency Contact: Thomasenia [place], [place] [address] [place] Home Phone: [phone] Mobile Phone: [phone] Relation: Daughter Secondary Emergency Contact: Tesoro,Darrell L Address: [address]          [place], [place] [address] [place] Home Phone: [phone] Relation: Son  Code Status:  DNR Goals of care: Advanced Directive information    10/20/2023   11:03 AM  Advanced Directives  Does Patient Have a Medical Advance Directive? No  Does patient want to make changes to medical advance directive? No - Patient declined  Would patient like information on creating a medical advance directive? No - Patient declined     Chief Complaint  Patient presents with   Acute Visit    Decreased hearing    HPI:  Pt is a 89 y.o. female seen today for acute visit due to decreased hearing.   She currently resides on the assisted living unit at [facility]. PMH: atrial fibrillation, HTN, stroke, hypothyroidism, right breast cancer with mets> 20 years ago s/p mastectomy, ileostomy s/p subtotal colectomy due to colon cancer> 3 years ago, bladder cancer 10 years ago, hiatal hernia, depression, anxiety and constipation.   Followed by [facility], she was recently seen due to decline in hearing. They cleaned her ear, but she continued to have popping and pain in right ear. Exam noted possible fluid behind TM. 05/08 she was started on Keflex  x 7 days. Today, popping and pain have subsided. She continues to report decreased hearing in right ear. No ear drainage. She does report intermittent nasal congestion with clear drainage. Denies  allergies or sneezing. Not on any allergy medications. Vitals stable.    Past Medical History:  Diagnosis Date   Acid reflux    Anemia    Anxiety    Atrial fibrillation (HCC)    Bladder cancer (HCC)    Breast cancer (HCC)    Cancer (HCC)    breast,bladder   Depression    Dysrhythmia    afib   GERD (gastroesophageal reflux disease)    History of hiatal hernia    HOH (hard of hearing)    aids   Hypertension    Hyperthyroidism    Hypothyroidism    Large bowel obstruction (HCC) 04/08/2021   Stroke (HCC)    2010   TIA (transient ischemic attack)    Past Surgical History:  Procedure Laterality Date   ABDOMINAL HYSTERECTOMY     ANKLE ARTHODESIS W/ ARTHROSCOPY     BLADDER SURGERY     CATARACT EXTRACTION W/PHACO Left 12/02/2014   Procedure: CATARACT EXTRACTION PHACO AND INTRAOCULAR LENS PLACEMENT (IOC);  Surgeon: Steven Dingeldein, MD;  Location: [facility];  Service: Ophthalmology;  Laterality: Left;  US01:45 AP%25.7 CDE42.99   CHOLECYSTECTOMY     EYE SURGERY     cataract   ILEOSTOMY CLOSURE N/A 04/15/2021   Procedure: ILEOSTOMY TAKEDOWN REVISION;  Surgeon: Flynn Hylan, MD;  Location: [facility];  Service: General;  Laterality: N/A;  not a takedown, just a revision...   INSERTION OF SUPRAPUBIC CATHETER  04/06/2021   Procedure: INSERTION OF FOLEY CATHETER;  Surgeon: Lawerence Pressman,  MD;  Location: [facility];  Service: Urology;;   JOINT REPLACEMENT     bil tkr   MASTECTOMY     partial right   REPLACEMENT TOTAL KNEE      Allergies  Allergen Reactions   Celebrex [Celecoxib] Anaphylaxis   Morphine And Codeine Swelling    Lips and mouth   Povidone-Iodine Other (See Comments)    Other reaction(s): ITCHING Other reaction(s): ITCHING    Sulfa Antibiotics     Outpatient Encounter Medications as of 12/05/2023  Medication Sig   acetaminophen  (TYLENOL ) 325 MG tablet Take 650 mg by mouth every 4 (four) hours as needed.   ALPRAZolam  (XANAX ) 0.25 MG tablet Take 1 tablet (0.25 mg  total) by mouth at bedtime.   Calcium Carb-Cholecalciferol (CALCIUM 600+D) 600-20 MG-MCG TABS Take 1 tablet by mouth daily.   Cyanocobalamin (B-12 COMPLIANCE INJECTION IJ) Inject 1,000 mcg as directed every 30 (thirty) days.   GUAIFENESIN PO Take 10 mLs by mouth every 6 (six) hours as needed.   letrozole  (FEMARA ) 2.5 MG tablet Take 1 tablet (2.5 mg total) by mouth daily.   levothyroxine  (SYNTHROID ) 75 MCG tablet Take 75 mcg by mouth daily. No food 2 hours prior to bedtime.   oxyCODONE  (ROXICODONE ) 5 MG immediate release tablet Take 0.5 tablets (2.5 mg total) by mouth every 6 (six) hours as needed for severe pain (pain score 7-10).   pantoprazole  (PROTONIX ) 40 MG tablet Take 40 mg by mouth every morning.   polyethylene glycol (MIRALAX  / GLYCOLAX ) 17 g packet Take 17 g by mouth daily in the afternoon.   polyethylene glycol (MIRALAX  / GLYCOLAX ) 17 g packet Take 17 g by mouth as needed.   sertraline  (ZOLOFT ) 50 MG tablet Take 50 mg by mouth daily. hs   Simethicone  (GAS-X PO) Take 125 mg by mouth daily as needed.   WARFARIN SODIUM  PO Take 3.5 mg by mouth daily.   No facility-administered encounter medications on file as of 12/05/2023.    Review of Systems  Constitutional:  Negative for fatigue and fever.  HENT:  Positive for hearing loss and rhinorrhea. Negative for dental problem, ear discharge, ear pain, sinus pressure, sneezing, sore throat and tinnitus.   Respiratory:  Negative for cough.   Cardiovascular:  Negative for chest pain.  Psychiatric/Behavioral:  Negative for confusion, dysphoric mood and sleep disturbance. The patient is not nervous/anxious.     Immunization History  Administered Date(s) Administered   Fluad Quad(high Dose 65+) 05/24/2023   Influenza Inj Mdck Quad Pf 05/28/2022   Influenza, High Dose Seasonal PF 05/30/2018, 05/31/2019, 05/26/2020   Influenza-Unspecified 05/30/2014, 06/09/2015, 04/25/2021   Moderna Covid-19 Vaccine Bivalent Booster 52yrs & up 06/15/2022    PFIZER Comirnaty(Gray Top)Covid-19 Tri-Sucrose Vaccine 08/22/2019, 09/12/2019, 05/26/2020   Pneumococcal Conjugate-13 05/31/2016   Pneumococcal Polysaccharide-23 07/27/2003   Tdap 04/16/2016   Zoster, Live 07/27/2003   Pertinent  Health Maintenance Due  Topic Date Due   DEXA SCAN  Never done   INFLUENZA VACCINE  02/24/2024      10/24/2021    7:29 PM 04/11/2023   10:16 AM 06/29/2023    3:25 PM 07/11/2023    2:27 PM 09/19/2023    7:22 PM  Fall Risk  Falls in the past year?  1 1 1 1   Was there an injury with Fall?  1 0 0 0  Fall Risk Category Calculator  3 1 1 1   (RETIRED) Patient Fall Risk Level Moderate fall risk      Patient at Risk for  Falls Due to  History of fall(s);Impaired balance/gait;Impaired mobility History of fall(s);Impaired balance/gait;Impaired mobility History of fall(s);Impaired balance/gait;Impaired mobility History of fall(s);Impaired balance/gait  Fall risk Follow up  Falls evaluation completed;Education provided;Falls prevention discussed Falls evaluation completed;Education provided;Falls prevention discussed Falls evaluation completed;Education provided Falls evaluation completed;Education provided   Functional Status Survey:    Vitals:   12/05/23 1554  BP: 123/83  Pulse: 73  Resp: 20  Temp: 98.7 F (37.1 C)  SpO2: 96%  Weight: 134 lb (60.8 kg)  Height: 5' (1.524 m)   Body mass index is 26.17 kg/m. Physical Exam Vitals reviewed.  Constitutional:      General: She is not in acute distress. HENT:     Head: Normocephalic.     Right Ear: Decreased hearing noted. No drainage or swelling. A middle ear effusion is present. There is no impacted cerumen. Tympanic membrane is not erythematous or bulging.     Left Ear: Tympanic membrane normal.     Nose: Rhinorrhea present.     Mouth/Throat:     Mouth: Mucous membranes are moist.     Pharynx: No posterior oropharyngeal erythema.  Eyes:     General:        Right eye: No discharge.        Left eye: No  discharge.  Cardiovascular:     Rate and Rhythm: Normal rate. Rhythm irregular.     Pulses: Normal pulses.     Heart sounds: Normal heart sounds.  Pulmonary:     Effort: Pulmonary effort is normal.     Breath sounds: Normal breath sounds.  Skin:    General: Skin is warm.     Capillary Refill: Capillary refill takes less than 2 seconds.  Neurological:     General: No focal deficit present.     Mental Status: She is alert and oriented to person, place, and time.     Motor: Weakness present.     Gait: Gait abnormal.  Psychiatric:        Mood and Affect: Mood normal.     Labs reviewed: Recent Labs    03/04/23 1635 03/22/23 0958 07/01/23 0000 10/12/23 1217  NA 140 137 138 138  K 4.0 3.9 4.3 4.1  CL 109 110 108 106  CO2 23 21* 25* 22  GLUCOSE 119* 108*  --  92  BUN 16 22 23* 20  CREATININE 0.95 0.96 1.1 1.11*  CALCIUM 9.0 9.6 9.2 9.2   Recent Labs    03/22/23 0958 07/01/23 0000 10/12/23 1217  AST 20 12* 18  ALT 19 7 14   ALKPHOS 52 47 51  BILITOT 0.4  --  0.6  PROT 7.3  --  7.1  ALBUMIN 4.0 3.7 3.5   Recent Labs    03/22/23 0958 03/22/23 0958 07/01/23 0000 10/06/23 0000 10/10/23 0000 10/12/23 1217  WBC 6.3   < > 4.1 6.1 4.7 5.5  NEUTROABS 4.6  --  2,608.00  --   --  3.7  HGB 12.3  --  12.0 11.1* 10.5* 11.1*  HCT 37.6  --  36 34* 32* 34.4*  MCV 96.7  --   --   --   --  97.2  PLT 235  --  208 192 213 224   < > = values in this interval not displayed.   Lab Results  Component Value Date   TSH 1.67 07/01/2023   No results found for: "HGBA1C" Lab Results  Component Value Date   TRIG 77 04/20/2021  Significant Diagnostic Results in last 30 days:  No results found.  Assessment/Plan 1. Right otitis media with effusion (Primary) - followed by [facility]> recent ear cleaning> ? Effusion  - 05/08 started on Keflex  due to popping/pain/decreased hearing - completed antibiotic> continues to have decreased hearing - no sign of infection on exam -  effusion noted on exam - will try Zyrtec x 14 days  - if not improvement> consider ENT referral   2. Long term current use of anticoagulant - Recent INR 2.0 12/05/2023 - repeat PT/INR in 4 weeks - cont coumadin     Family/ staff Communication: plan discussed with patient and nurse  Labs/tests ordered:  none

## 2023-12-13 ENCOUNTER — Encounter: Payer: Self-pay | Admitting: Adult Health

## 2023-12-13 ENCOUNTER — Non-Acute Institutional Stay: Payer: Self-pay | Admitting: Adult Health

## 2023-12-13 DIAGNOSIS — I482 Chronic atrial fibrillation, unspecified: Secondary | ICD-10-CM | POA: Diagnosis not present

## 2023-12-13 DIAGNOSIS — E039 Hypothyroidism, unspecified: Secondary | ICD-10-CM | POA: Diagnosis not present

## 2023-12-13 DIAGNOSIS — H9191 Unspecified hearing loss, right ear: Secondary | ICD-10-CM | POA: Diagnosis not present

## 2023-12-13 NOTE — Progress Notes (Unsigned)
 Location:  [facility] Room Number: AL22-A Place of Service:  ALF [phone]) Provider:  Medina-Vargas, Caley Ciaramitaro, DNP, FNP-BC  Patient Care Team: Marguerite Shiley, MD as PCP - General (Internal Medicine) Avonne Boettcher, MD as Consulting Physician (Oncology)  Extended Emergency Contact Information Primary Emergency Contact: Tuggle,Susan          [place], [place] [address] [place] Home Phone: [phone] Mobile Phone: [phone] Relation: Daughter Secondary Emergency Contact: Mauck,Darrell L Address: [address]          [place], [place] [address] [place] Home Phone: [phone] Relation: Son  Code Status:  DNR  Goals of care: Advanced Directive information    10/20/2023   11:03 AM  Advanced Directives  Does Patient Have a Medical Advance Directive? No  Does patient want to make changes to medical advance directive? No - Patient declined  Would patient like information on creating a medical advance directive? No - Patient declined     Chief Complaint  Patient presents with   Hearing Problem    Right Ear Hearing Loss    HPI:  Pt is a 89 y.o. female seen today for Right Ear Hearing Loss.    Past Medical History:  Diagnosis Date   Acid reflux    Anemia    Anxiety    Atrial fibrillation (HCC)    Bladder cancer (HCC)    Breast cancer (HCC)    Cancer (HCC)    breast,bladder   Depression    Dysrhythmia    afib   GERD (gastroesophageal reflux disease)    History of hiatal hernia    HOH (hard of hearing)    aids   Hypertension    Hyperthyroidism    Hypothyroidism    Large bowel obstruction (HCC) 04/08/2021   Stroke (HCC)    2010   TIA (transient ischemic attack)    Past Surgical History:  Procedure Laterality Date   ABDOMINAL HYSTERECTOMY     ANKLE ARTHODESIS W/ ARTHROSCOPY     BLADDER SURGERY     CATARACT EXTRACTION W/PHACO Left 12/02/2014   Procedure: CATARACT EXTRACTION PHACO AND INTRAOCULAR LENS PLACEMENT (IOC);   Surgeon: Steven Dingeldein, MD;  Location: [facility];  Service: Ophthalmology;  Laterality: Left;  US01:45 AP%25.7 CDE42.99   CHOLECYSTECTOMY     EYE SURGERY     cataract   ILEOSTOMY CLOSURE N/A 04/15/2021   Procedure: ILEOSTOMY TAKEDOWN REVISION;  Surgeon: Flynn Hylan, MD;  Location: [facility];  Service: General;  Laterality: N/A;  not a takedown, just a revision...   INSERTION OF SUPRAPUBIC CATHETER  04/06/2021   Procedure: INSERTION OF FOLEY CATHETER;  Surgeon: Lawerence Pressman, MD;  Location: [facility];  Service: Urology;;   JOINT REPLACEMENT     bil tkr   MASTECTOMY     partial right   REPLACEMENT TOTAL KNEE      Allergies  Allergen Reactions   Celebrex [Celecoxib] Anaphylaxis   Morphine And Codeine Swelling    Lips and mouth   Povidone-Iodine Other (See Comments)    Other reaction(s): ITCHING Other reaction(s): ITCHING    Sulfa Antibiotics     Outpatient Encounter Medications as of 12/13/2023  Medication Sig   acetaminophen  (TYLENOL ) 325 MG tablet Take 650 mg by mouth every 4 (four) hours as needed.   ALPRAZolam  (XANAX ) 0.25 MG tablet Take 1 tablet (0.25 mg total) by mouth at bedtime.   Calcium Carb-Cholecalciferol (CALCIUM 600+D) 600-20 MG-MCG TABS Take 1 tablet by  mouth daily.   cetirizine (ZYRTEC) 10 MG tablet Take 10 mg by mouth daily.   Cyanocobalamin (B-12 COMPLIANCE INJECTION IJ) Inject 1,000 mcg as directed every 30 (thirty) days.   GUAIFENESIN PO Take 10 mLs by mouth every 6 (six) hours as needed.   letrozole  (FEMARA ) 2.5 MG tablet Take 1 tablet (2.5 mg total) by mouth daily.   levothyroxine  (SYNTHROID ) 75 MCG tablet Take 75 mcg by mouth daily. No food 2 hours prior to bedtime.   oxyCODONE  (ROXICODONE ) 5 MG immediate release tablet Take 0.5 tablets (2.5 mg total) by mouth every 6 (six) hours as needed for severe pain (pain score 7-10).   pantoprazole  (PROTONIX ) 40 MG tablet Take 40 mg by mouth every morning.   polyethylene glycol (MIRALAX  / GLYCOLAX ) 17 g packet  Take 17 g by mouth daily in the afternoon.   polyethylene glycol (MIRALAX  / GLYCOLAX ) 17 g packet Take 17 g by mouth as needed.   sertraline  (ZOLOFT ) 50 MG tablet Take 50 mg by mouth daily. hs   Simethicone  (GAS-X PO) Take 125 mg by mouth daily as needed.   WARFARIN SODIUM  PO Take 3.5 mg by mouth daily.   No facility-administered encounter medications on file as of 12/13/2023.    Review of Systems ***    Immunization History  Administered Date(s) Administered   Fluad Quad(high Dose 65+) 05/24/2023   Influenza Inj Mdck Quad Pf 05/28/2022   Influenza, High Dose Seasonal PF 05/30/2018, 05/31/2019, 05/26/2020   Influenza-Unspecified 05/30/2014, 06/09/2015, 04/25/2021   Moderna Covid-19 Vaccine Bivalent Booster 44yrs & up 06/15/2022   PFIZER Comirnaty(Gray Top)Covid-19 Tri-Sucrose Vaccine 08/22/2019, 09/12/2019, 05/26/2020   Pneumococcal Conjugate-13 05/31/2016   Pneumococcal Polysaccharide-23 07/27/2003   Tdap 04/16/2016   Zoster, Live 07/27/2003   Pertinent  Health Maintenance Due  Topic Date Due   DEXA SCAN  Never done   INFLUENZA VACCINE  02/24/2024      04/11/2023   10:16 AM 06/29/2023    3:25 PM 07/11/2023    2:27 PM 09/19/2023    7:22 PM 12/06/2023   11:51 AM  Fall Risk  Falls in the past year? 1 1 1 1  0  Was there an injury with Fall? 1 0 0 0 0  Fall Risk Category Calculator 3 1 1 1  0  Patient at Risk for Falls Due to History of fall(s);Impaired balance/gait;Impaired mobility History of fall(s);Impaired balance/gait;Impaired mobility History of fall(s);Impaired balance/gait;Impaired mobility History of fall(s);Impaired balance/gait History of fall(s)  Fall risk Follow up Falls evaluation completed;Education provided;Falls prevention discussed Falls evaluation completed;Education provided;Falls prevention discussed Falls evaluation completed;Education provided Falls evaluation completed;Education provided Falls evaluation completed     Vitals:   12/13/23 1414  BP: 134/72   Pulse: 70  Resp: 18  Temp: (!) 97.1 F (36.2 C)  SpO2: 97%  Weight: 134 lb (60.8 kg)  Height: 5' (1.524 m)   Body mass index is 26.17 kg/m.  Physical Exam     Labs reviewed: Recent Labs    03/04/23 1635 03/22/23 0958 07/01/23 0000 10/12/23 1217  NA 140 137 138 138  K 4.0 3.9 4.3 4.1  CL 109 110 108 106  CO2 23 21* 25* 22  GLUCOSE 119* 108*  --  92  BUN 16 22 23* 20  CREATININE 0.95 0.96 1.1 1.11*  CALCIUM 9.0 9.6 9.2 9.2   Recent Labs    03/22/23 0958 07/01/23 0000 10/12/23 1217  AST 20 12* 18  ALT 19 7 14   ALKPHOS 52 47 51  BILITOT 0.4  --  0.6  PROT 7.3  --  7.1  ALBUMIN 4.0 3.7 3.5   Recent Labs    03/22/23 0958 03/22/23 0958 07/01/23 0000 10/06/23 0000 10/10/23 0000 10/12/23 1217  WBC 6.3   < > 4.1 6.1 4.7 5.5  NEUTROABS 4.6  --  2,608.00  --   --  3.7  HGB 12.3  --  12.0 11.1* 10.5* 11.1*  HCT 37.6  --  36 34* 32* 34.4*  MCV 96.7  --   --   --   --  97.2  PLT 235  --  208 192 213 224   < > = values in this interval not displayed.   Lab Results  Component Value Date   TSH 1.67 07/01/2023   No results found for: "HGBA1C" Lab Results  Component Value Date   TRIG 77 04/20/2021    Significant Diagnostic Results in last 30 days:  No results found.  Assessment/Plan ***   Family/ staff Communication: Discussed plan of care with resident and charge nurse  Labs/tests ordered:     Tarisha Fader Medina-Vargas, DNP, MSN, FNP-BC [facility] [phone] (Monday-Friday 8:00 a.m. - 5:00 p.m.) [phone] (after hours)

## 2023-12-20 ENCOUNTER — Other Ambulatory Visit: Payer: Self-pay | Admitting: Adult Health

## 2023-12-20 DIAGNOSIS — H9191 Unspecified hearing loss, right ear: Secondary | ICD-10-CM

## 2023-12-21 ENCOUNTER — Telehealth: Payer: Self-pay | Admitting: Internal Medicine

## 2023-12-21 NOTE — Telephone Encounter (Signed)
 Copied from CRM [id number]. Topic: Referral - Question >> Dec 21, 2023  2:49 PM Madelyne Schiff wrote: [name] would like to speak with MEDINA-VARGAS, MONINA C regarding her mothers referral diagnosis she was not aware of:  Dx: Hearing loss of right ear as late effect of temporal bone fracture  Please advise  Called patient [name] and she had concerns about why the referral order said it was a temporal fracture, after looking through notes and explaining we got the situation understood and she is requesting the referral be sent to [facility] in [place] being she is already a patient there and seeing Dr. Paul Juengel. She already contacted the office and they can get her in Monday morning please advise

## 2023-12-23 ENCOUNTER — Other Ambulatory Visit: Payer: Self-pay | Admitting: Nurse Practitioner

## 2023-12-26 DIAGNOSIS — H6981 Other specified disorders of Eustachian tube, right ear: Secondary | ICD-10-CM | POA: Diagnosis not present

## 2023-12-26 DIAGNOSIS — H6523 Chronic serous otitis media, bilateral: Secondary | ICD-10-CM | POA: Diagnosis not present

## 2023-12-26 DIAGNOSIS — H90A31 Mixed conductive and sensorineural hearing loss, unilateral, right ear with restricted hearing on the contralateral side: Secondary | ICD-10-CM | POA: Diagnosis not present

## 2023-12-27 ENCOUNTER — Encounter: Payer: Self-pay | Admitting: Otolaryngology

## 2023-12-27 ENCOUNTER — Other Ambulatory Visit: Payer: Self-pay | Admitting: Otolaryngology

## 2023-12-27 ENCOUNTER — Other Ambulatory Visit: Payer: Self-pay

## 2023-12-27 MED ORDER — CIPROFLOXACIN-DEXAMETHASONE 0.3-0.1 % OT SUSP
4.0000 [drp] | Freq: Two times a day (BID) | OTIC | 0 refills | Status: DC
  Filled 2023-12-27: qty 7.5, 19d supply, fill #0

## 2023-12-27 NOTE — Anesthesia Preprocedure Evaluation (Addendum)
 Anesthesia Evaluation    Airway        Dental   Pulmonary           Cardiovascular hypertension,   09-03-21 INTERPRETATION  NORMAL LEFT VENTRICULAR SYSTOLIC FUNCTION   WITH MILD LVH  NORMAL RIGHT VENTRICULAR SYSTOLIC FUNCTION  MODERATE VALVULAR REGURGITATION (See above)  NO VALVULAR STENOSIS  IRREGULAR HEART RHYTHM CAPTURED THROUGHOUT EXAM  ESTIMATED LVEF >55%  Aortic: NORMAL GRADIENTS  Mitral: MODERATE - MR  Tricuspid: MODERATE - TR  Pulmonic: MILD PI  MILD LAE     Neuro/Psych    GI/Hepatic   Endo/Other    Renal/GU      Musculoskeletal 2024 office note: Right breast cancer with mets- followed by oncology, recent PET scan showed mets to left L1/L5/ iliac crest and right acetabular, remains on Letrozole ,   Abdominal   Peds  Hematology   Anesthesia Other Findings Chronic a-fib Severe mitral regurgitation;  Benign essential hypertension;  Hyperlipidemia, mixed;  Severe tricuspid valve insufficiency  Pulmonary hypertension Severe mitral regurg Hypertension  Dysrhythmia Stroke 2010, left MCA, right side weakness and expressive aphasia, resolved Stroke (CMS-HCC) 03/2009  involving left MCA inferior division, insular cortex and frontal operculum causing right sided face and arm weakness and expressive aphasia with complete resolution    GERD (gastroesophageal reflux disease) History of hiatal hernia Chronic DOE Hypothyroidism  Depression Anxiety  Acid reflux  Hyperthyroidism  TIA (transient ischemic attack) Breast cancer Bladder cancer  Anemia Large bowel obstruction Ileostomy present   Wears dentures Wears hearing aid in both ears Neck stiffness Hearing loss of right ear as late effect of temporal bone fracture  Bilateral sensorineural hearing loss 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

## 2023-12-28 ENCOUNTER — Other Ambulatory Visit: Payer: Self-pay

## 2024-01-03 ENCOUNTER — Encounter: Payer: Self-pay | Admitting: Anesthesiology

## 2024-01-03 ENCOUNTER — Ambulatory Visit: Admission: RE | Admit: 2024-01-03 | Source: Home / Self Care | Admitting: Otolaryngology

## 2024-01-03 HISTORY — DX: Unspecified hearing loss, right ear: S02.19XS

## 2024-01-03 HISTORY — DX: Presence of external hearing-aid: Z97.4

## 2024-01-03 HISTORY — DX: Unspecified hearing loss, right ear: H91.91

## 2024-01-03 HISTORY — DX: Chronic atrial fibrillation, unspecified: I48.20

## 2024-01-03 HISTORY — DX: Presence of dental prosthetic device (complete) (partial): Z97.2

## 2024-01-03 HISTORY — DX: Ileostomy status: Z93.2

## 2024-01-03 HISTORY — DX: Malignant neoplasm of unspecified site of right female breast: C50.911

## 2024-01-03 HISTORY — DX: Torticollis: M43.6

## 2024-01-03 HISTORY — DX: Personal history of other diseases of the digestive system: Z87.19

## 2024-01-03 HISTORY — DX: Other forms of dyspnea: R06.09

## 2024-01-03 SURGERY — MYRINGOTOMY WITH TUBE PLACEMENT
Anesthesia: General | Laterality: Bilateral

## 2024-01-09 ENCOUNTER — Non-Acute Institutional Stay: Payer: Self-pay | Admitting: Orthopedic Surgery

## 2024-01-09 ENCOUNTER — Other Ambulatory Visit: Payer: Self-pay

## 2024-01-09 ENCOUNTER — Encounter: Payer: Self-pay | Admitting: Orthopedic Surgery

## 2024-01-09 DIAGNOSIS — F339 Major depressive disorder, recurrent, unspecified: Secondary | ICD-10-CM

## 2024-01-09 DIAGNOSIS — S0219XS Other fracture of base of skull, sequela: Secondary | ICD-10-CM | POA: Diagnosis not present

## 2024-01-09 DIAGNOSIS — C7951 Secondary malignant neoplasm of bone: Secondary | ICD-10-CM

## 2024-01-09 DIAGNOSIS — I482 Chronic atrial fibrillation, unspecified: Secondary | ICD-10-CM | POA: Diagnosis not present

## 2024-01-09 DIAGNOSIS — H9191 Unspecified hearing loss, right ear: Secondary | ICD-10-CM | POA: Diagnosis not present

## 2024-01-09 DIAGNOSIS — Z85038 Personal history of other malignant neoplasm of large intestine: Secondary | ICD-10-CM

## 2024-01-09 DIAGNOSIS — C50911 Malignant neoplasm of unspecified site of right female breast: Secondary | ICD-10-CM | POA: Diagnosis not present

## 2024-01-09 DIAGNOSIS — Z7901 Long term (current) use of anticoagulants: Secondary | ICD-10-CM | POA: Diagnosis not present

## 2024-01-09 DIAGNOSIS — E039 Hypothyroidism, unspecified: Secondary | ICD-10-CM

## 2024-01-09 DIAGNOSIS — Z932 Ileostomy status: Secondary | ICD-10-CM | POA: Diagnosis not present

## 2024-01-09 DIAGNOSIS — M25551 Pain in right hip: Secondary | ICD-10-CM | POA: Diagnosis not present

## 2024-01-09 NOTE — Progress Notes (Signed)
 Location:  [facility] Room Number: 22/A Place of Service:  ALF [phone]) Provider:  Arnetha Bhat, NP   Marguerite Shiley, MD  Patient Care Team: Marguerite Shiley, MD as PCP - General (Internal Medicine) Avonne Boettcher, MD as Consulting Physician (Oncology)  Extended Emergency Contact Information Primary Emergency Contact: Thomasenia [place], [place] [address] [place] Home Phone: [phone] Mobile Phone: [phone] Relation: Daughter Secondary Emergency Contact: Murillo,Darrell L Address: [address]          [place], [place] [address] [place] Home Phone: [phone] Relation: Son  Code Status:  DNR Goals of care: Advanced Directive information    10/20/2023   11:03 AM  Advanced Directives  Does Patient Have a Medical Advance Directive? No  Does patient want to make changes to medical advance directive? No - Patient declined  Would patient like information on creating a medical advance directive? No - Patient declined     No chief complaint on file.   HPI:  Pt is a 89 y.o. female seen today for medical management of chronic diseases.    She currently resides on the assisted living unit at [facility]. PMH: atrial fibrillation, HTN, stroke, hypothyroidism, right breast cancer with mets> 20 years ago s/p mastectomy, ileostomy s/p subtotal colectomy due to colon cancer> 3 years ago, bladder cancer 10 years ago, hiatal hernia, depression, anxiety and constipation.    Hearing loss- involves right ear, unsuccessful trial of Zytrec and Keflex , onset x 6 weeks, scheduled for tuba placement sometime this week per patient Chronic afib- remains on warfarin for clot prevention, next PT/INR 06/16 Right breast cancer with mets- followed by oncology, PET scan showed mets to left L1/L5/ iliac crest and right acetabular, remains on Letrozole , not interested in [place] or future oncology visits H/o colon cancer with Ileostomy- s/p subtotal  colectomy due to colon cancer about 3 years ago, CEA 6.6 (08/09), normal output at this time, no rectal bleeding Hypothyroidism- TSH 1.67 06/29/2024, remains on levothyroxine  Right hip pain- remains on tylenol  prn and oxycodone  Depression and anxiety- Na+ 138 06/29/2024, remains on zoloft    Recent blood pressures:  06/11- 110/68  06/04- 114/63  05/28- 147/73  Recent weights:  06/01- 133.8 lbs  05/01- 134 lbs  04/01- 133.4 lbs   Past Medical History:  Diagnosis Date   Acid reflux    Anemia    Anxiety    Atrial fibrillation (HCC)    Bladder cancer (HCC)    Breast cancer (HCC)    Cancer (HCC)    breast,bladder   Carcinoma of right breast metastatic to bone (HCC)    Chronic atrial fibrillation (HCC)    Depression    DOE (dyspnea on exertion)    Dysrhythmia    afib   GERD (gastroesophageal reflux disease)    Hearing loss of right ear as late effect of temporal bone fracture (HCC)    History of hiatal hernia    History of rectal bleeding    HOH (hard of hearing)    aids   Hypertension    Hyperthyroidism    Hypothyroidism    Ileostomy present (HCC)    Large bowel obstruction (HCC) 04/08/2021   Neck stiffness    side to side motion.  OK up and down   Stroke [facility])    2010   TIA (transient ischemic attack)    Wears dentures    full upper  and lower   Wears hearing aid in both ears    Lost left one   Past Surgical History:  Procedure Laterality Date   ABDOMINAL HYSTERECTOMY     ANKLE ARTHODESIS W/ ARTHROSCOPY     BLADDER SURGERY     CATARACT EXTRACTION W/PHACO Left 12/02/2014   Procedure: CATARACT EXTRACTION PHACO AND INTRAOCULAR LENS PLACEMENT (IOC);  Surgeon: Steven Dingeldein, MD;  Location: [facility] OR[facility];  Service: Ophthalmology;  Laterality: Left;  US01:45 AP%25.7 CDE42.99   CHOLECYSTECTOMY     EYE SURGERY     cataract   ILEOSTOMY CLOSURE N/A 04/15/2021   Procedure: ILEOSTOMY TAKEDOWN REVISION;  Surgeon: Flynn Hylan, MD;  Location: [facility] OR[facility];  Service: General;   Laterality: N/A;  not a takedown, just a revision...   INSERTION OF SUPRAPUBIC CATHETER  04/06/2021   Procedure: INSERTION OF FOLEY CATHETER;  Surgeon: Lawerence Pressman, MD;  Location: [facility] OR[facility];  Service: Urology;;   JOINT REPLACEMENT     bil tkr   MASTECTOMY     partial right   REPLACEMENT TOTAL KNEE      Allergies  Allergen Reactions   Celebrex [Celecoxib] Anaphylaxis   Morphine And Codeine Swelling    Lips and mouth   Povidone-Iodine Other (See Comments)    Other reaction(s): ITCHING (12/27/23 Pt reports no issues.)    Sulfa Antibiotics Nausea Only    Outpatient Encounter Medications as of 01/09/2024  Medication Sig   acetaminophen  (TYLENOL ) 325 MG tablet Take 650 mg by mouth every 4 (four) hours as needed.   ALPRAZolam  (XANAX ) 0.25 MG tablet Take 1 tablet (0.25 mg total) by mouth at bedtime.   Calcium Carb-Cholecalciferol (CALCIUM 600+D) 600-20 MG-MCG TABS Take 1 tablet by mouth daily.   cetirizine (ZYRTEC) 10 MG tablet Take 10 mg by mouth daily. (Patient not taking: Reported on 12/27/2023)   ciprofloxacin -dexamethasone  (CIPRODEX ) OTIC suspension Place 4 drops into both ears 2 (two) times daily for 5 days. (DOS 01/03/24)   Cyanocobalamin (B-12 COMPLIANCE INJECTION IJ) Inject 1,000 mcg as directed every 30 (thirty) days.   GUAIFENESIN PO Take 10 mLs by mouth every 6 (six) hours as needed.   letrozole  (FEMARA ) 2.5 MG tablet Take 1 tablet (2.5 mg total) by mouth daily.   levothyroxine  (SYNTHROID ) 75 MCG tablet Take 75 mcg by mouth daily. No food 2 hours prior to bedtime.   oxyCODONE  (ROXICODONE ) 5 MG immediate release tablet Take 0.5 tablets (2.5 mg total) by mouth every 6 (six) hours as needed for severe pain (pain score 7-10).   pantoprazole  (PROTONIX ) 40 MG tablet Take 40 mg by mouth every morning.   polyethylene glycol (MIRALAX  / GLYCOLAX ) 17 g packet Take 17 g by mouth as needed.   sertraline  (ZOLOFT ) 50 MG tablet Take 50 mg by mouth daily. hs   Simethicone  (GAS-X PO) Take 125 mg  by mouth daily as needed.   WARFARIN SODIUM  PO Take 3.5 mg by mouth daily.   No facility-administered encounter medications on file as of 01/09/2024.    Review of Systems  Constitutional:  Negative for fatigue and fever.  HENT:  Negative for sore throat and trouble swallowing.   Eyes:  Negative for visual disturbance.  Respiratory:  Negative for cough and shortness of breath.   Cardiovascular:  Negative for chest pain and leg swelling.  Gastrointestinal:  Negative for abdominal distention, abdominal pain and anal bleeding.  Genitourinary:  Negative for dysuria, frequency and hematuria.  Musculoskeletal:  Positive for arthralgias, back pain and gait problem.  Skin:  Negative for wound.  Neurological:  Positive for weakness. Negative for dizziness and headaches.  Psychiatric/Behavioral:  Positive for dysphoric mood. Negative for confusion and sleep disturbance. The patient is nervous/anxious.     Immunization History  Administered Date(s) Administered   Fluad Quad(high Dose 65+) 05/24/2023   Influenza Inj Mdck Quad Pf 05/28/2022   Influenza, High Dose Seasonal PF 05/30/2018, 05/31/2019, 05/26/2020   Influenza-Unspecified 05/30/2014, 06/09/2015, 04/25/2021   Moderna Covid-19 Vaccine Bivalent Booster 54yrs & up 06/15/2022   PFIZER Comirnaty(Gray Top)Covid-19 Tri-Sucrose Vaccine 08/22/2019, 09/12/2019, 05/26/2020   Pneumococcal Conjugate-13 05/31/2016   Pneumococcal Polysaccharide-23 07/27/2003   Tdap 04/16/2016   Zoster, Live 07/27/2003   Pertinent  Health Maintenance Due  Topic Date Due   DEXA SCAN  Never done   INFLUENZA VACCINE  02/24/2024      04/11/2023   10:16 AM 06/29/2023    3:25 PM 07/11/2023    2:27 PM 09/19/2023    7:22 PM 12/06/2023   11:51 AM  Fall Risk  Falls in the past year? 1 1 1 1  0  Was there an injury with Fall? 1 0 0 0 0  Fall Risk Category Calculator 3 1 1 1  0  Patient at Risk for Falls Due to History of fall(s);Impaired balance/gait;Impaired mobility  History of fall(s);Impaired balance/gait;Impaired mobility History of fall(s);Impaired balance/gait;Impaired mobility History of fall(s);Impaired balance/gait History of fall(s)  Fall risk Follow up Falls evaluation completed;Education provided;Falls prevention discussed Falls evaluation completed;Education provided;Falls prevention discussed Falls evaluation completed;Education provided Falls evaluation completed;Education provided Falls evaluation completed   Functional Status Survey:    Vitals:   01/09/24 1230  BP: 110/68  Resp: 18  Temp: (!) 97.2 F (36.2 C)  SpO2: 97%  Weight: 133 lb 12.8 oz (60.7 kg)  Height: 5' (1.524 m)   Body mass index is 26.13 kg/m. Physical Exam Vitals reviewed.  Constitutional:      General: She is not in acute distress. HENT:     Head: Normocephalic.     Right Ear: Tympanic membrane normal.     Left Ear: Tympanic membrane normal.     Nose: Nose normal.     Mouth/Throat:     Mouth: Mucous membranes are moist.   Eyes:     General:        Right eye: No discharge.        Left eye: No discharge.    Cardiovascular:     Rate and Rhythm: Normal rate. Rhythm irregular.     Pulses: Normal pulses.     Heart sounds: Normal heart sounds.  Pulmonary:     Effort: Pulmonary effort is normal.     Breath sounds: Normal breath sounds.  Abdominal:     General: There is no distension.     Palpations: Abdomen is soft.     Tenderness: There is no abdominal tenderness.   Musculoskeletal:     Cervical back: Neck supple.     Right lower leg: No edema.     Left lower leg: No edema.   Skin:    General: Skin is warm.     Capillary Refill: Capillary refill takes less than 2 seconds.   Neurological:     General: No focal deficit present.     Mental Status: She is alert and oriented to person, place, and time.     Motor: Weakness present.     Gait: Gait abnormal.   Psychiatric:        Mood and Affect: Mood normal.  Labs reviewed: Recent Labs     03/04/23 1635 03/22/23 0958 07/01/23 0000 10/12/23 1217  NA 140 137 138 138  K 4.0 3.9 4.3 4.1  CL 109 110 108 106  CO2 23 21* 25* 22  GLUCOSE 119* 108*  --  92  BUN 16 22 23* 20  CREATININE 0.95 0.96 1.1 1.11*  CALCIUM 9.0 9.6 9.2 9.2   Recent Labs    03/22/23 0958 07/01/23 0000 10/12/23 1217  AST 20 12* 18  ALT 19 7 14   ALKPHOS 52 47 51  BILITOT 0.4  --  0.6  PROT 7.3  --  7.1  ALBUMIN 4.0 3.7 3.5   Recent Labs    03/22/23 0958 03/22/23 0958 07/01/23 0000 10/06/23 0000 10/10/23 0000 10/12/23 1217  WBC 6.3   < > 4.1 6.1 4.7 5.5  NEUTROABS 4.6  --  2,608.00  --   --  3.7  HGB 12.3  --  12.0 11.1* 10.5* 11.1*  HCT 37.6  --  36 34* 32* 34.4*  MCV 96.7  --   --   --   --  97.2  PLT 235  --  208 192 213 224   < > = values in this interval not displayed.   Lab Results  Component Value Date   TSH 1.67 07/01/2023   No results found for: HGBA1C Lab Results  Component Value Date   TRIG 77 04/20/2021    Significant Diagnostic Results in last 30 days:  No results found.  Assessment/Plan 1. 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 Atrial fibrillation, chronic (HCC) - HR< 100 without medication - cont Warfarin for clot prevention - INR drawn today  3. Carcinoma of right breast metastatic to bone [facility]) - diagnosed about 20 years ago - PET scan showed mets to left L1/L5/ iliac crest and right acetabular - on Letrozole  - does not want to try Ibrance or follow oncology at this time   4. History of colon cancer - s/p subtotal colectomy with ileostomy - CEA 6.6 02/2023  5. Acquired hypothyroidism - TSH stable - cont levothyroxine   6. Pain in right hip - cont tylenol  and oxycodone   7. Depression, recurrent (HCC) - no mood changes  - Na+ stable - cont Zoloft   8. Ileostomy present (HCC) -  cont ostomy care   Family/ staff Communication: plan discussed with patient and nurse   Labs/tests ordered:  cbc/diff, cmp

## 2024-01-11 DIAGNOSIS — H6521 Chronic serous otitis media, right ear: Secondary | ICD-10-CM | POA: Diagnosis not present

## 2024-01-21 DIAGNOSIS — Z932 Ileostomy status: Secondary | ICD-10-CM | POA: Diagnosis not present

## 2024-01-21 DIAGNOSIS — Z85038 Personal history of other malignant neoplasm of large intestine: Secondary | ICD-10-CM | POA: Diagnosis not present

## 2024-01-26 ENCOUNTER — Other Ambulatory Visit: Payer: Self-pay | Admitting: Internal Medicine

## 2024-01-26 DIAGNOSIS — F419 Anxiety disorder, unspecified: Secondary | ICD-10-CM

## 2024-01-26 MED ORDER — ALPRAZOLAM 0.25 MG PO TABS: 0.2500 mg | ORAL_TABLET | Freq: Every day | ORAL | 2 refills | Status: DC

## 2024-02-01 DIAGNOSIS — H90A22 Sensorineural hearing loss, unilateral, left ear, with restricted hearing on the contralateral side: Secondary | ICD-10-CM | POA: Diagnosis not present

## 2024-02-01 DIAGNOSIS — H6521 Chronic serous otitis media, right ear: Secondary | ICD-10-CM | POA: Diagnosis not present

## 2024-02-01 DIAGNOSIS — H903 Sensorineural hearing loss, bilateral: Secondary | ICD-10-CM | POA: Diagnosis not present

## 2024-02-16 DIAGNOSIS — Z13228 Encounter for screening for other metabolic disorders: Secondary | ICD-10-CM | POA: Diagnosis not present

## 2024-02-16 LAB — CBC AND DIFFERENTIAL
HCT: 34 — AB (ref 36–46)
Hemoglobin: 11.2 — AB (ref 12.0–16.0)
Neutrophils Absolute: 2293
Platelets: 213 K/uL (ref 150–400)
WBC: 3.9

## 2024-02-16 LAB — BASIC METABOLIC PANEL WITH GFR
BUN: 17 (ref 4–21)
CO2: 26 — AB (ref 13–22)
Chloride: 110 — AB (ref 99–108)
Creatinine: 1.1 (ref 0.5–1.1)
Glucose: 111
Potassium: 4.4 meq/L (ref 3.5–5.1)
Sodium: 140 (ref 137–147)

## 2024-02-16 LAB — COMPREHENSIVE METABOLIC PANEL WITH GFR
Albumin: 3.6 (ref 3.5–5.0)
Calcium: 9.2 (ref 8.7–10.7)
Globulin: 2.9

## 2024-02-16 LAB — CBC: RBC: 3.64 — AB (ref 3.87–5.11)

## 2024-02-16 LAB — HEPATIC FUNCTION PANEL
ALT: 10 U/L (ref 7–35)
AST: 15 (ref 13–35)
Alkaline Phosphatase: 62 (ref 25–125)

## 2024-02-22 ENCOUNTER — Other Ambulatory Visit: Payer: Self-pay | Admitting: Orthopedic Surgery

## 2024-02-22 DIAGNOSIS — F419 Anxiety disorder, unspecified: Secondary | ICD-10-CM

## 2024-02-22 MED ORDER — ALPRAZOLAM 0.25 MG PO TABS: 0.2500 mg | ORAL_TABLET | Freq: Every day | ORAL | 2 refills | Status: DC

## 2024-02-28 DIAGNOSIS — Z932 Ileostomy status: Secondary | ICD-10-CM | POA: Diagnosis not present

## 2024-02-28 DIAGNOSIS — Z85038 Personal history of other malignant neoplasm of large intestine: Secondary | ICD-10-CM | POA: Diagnosis not present

## 2024-03-08 ENCOUNTER — Non-Acute Institutional Stay: Payer: Self-pay | Admitting: Internal Medicine

## 2024-03-08 DIAGNOSIS — R109 Unspecified abdominal pain: Secondary | ICD-10-CM

## 2024-03-08 DIAGNOSIS — Z7901 Long term (current) use of anticoagulants: Secondary | ICD-10-CM | POA: Diagnosis not present

## 2024-03-09 NOTE — Progress Notes (Signed)
 Location: [facility] of Service:  ALF ([id number])  Provider:   Code Status: DNR Goals of Care:     10/20/2023   11:03 AM  Advanced Directives  Does Patient Have a Medical Advance Directive? No  Does patient want to make changes to medical advance directive? No - Patient declined  Would patient like information on creating a medical advance directive? No - Patient declined     Chief Complaint  Patient presents with   Acute Visit    HPI: Patient is a 89 y.o. female seen today for an acute visit for abdominal Discomfort  [place] in [facility]   Diagnosis of metastatic breast cancer PET scan done has shown multifocal osseous metastasis including in the left L1, left L5 and iliac crest and right Acetabular Patient also has a history of A-fib.  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   Seen today for concern of Discomfort in her abdomen noticed since yesterday also she is concerned that she has not has not seen any stool in her Bag. She usually get more stools and has to empty her bag more frequently She denies any nausea or vomiting.  She did have good breakfast did not feel like eating much for lunch. She has been taking MiraLAX  every day. 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    Past Surgical History:  Procedure Laterality Date   ABDOMINAL HYSTERECTOMY     ANKLE ARTHODESIS W/ ARTHROSCOPY     BLADDER SURGERY     CATARACT EXTRACTION W/PHACO Left 12/02/2014   Procedure: CATARACT EXTRACTION PHACO AND INTRAOCULAR LENS PLACEMENT (IOC);  Surgeon: Steven Dingeldein, MD;  Location: [facility] ORS;  Service: Ophthalmology;  Laterality: Left;  US01:45 AP%25.7 CDE42.99   CHOLECYSTECTOMY     EYE SURGERY     cataract   ILEOSTOMY CLOSURE N/A 04/15/2021   Procedure: ILEOSTOMY TAKEDOWN REVISION;  Surgeon: Lane Shope, MD;  Location: [facility] ORS;  Service: General;  Laterality: N/A;  not a takedown, just a revision...   INSERTION OF SUPRAPUBIC CATHETER  04/06/2021   Procedure: INSERTION OF FOLEY CATHETER;  Surgeon: Francisca Redell BROCKS, MD;  Location: [facility] ORS;  Service:  Urology;;   JOINT REPLACEMENT     bil tkr   MASTECTOMY     partial right   REPLACEMENT TOTAL KNEE      Allergies  Allergen Reactions   Celebrex [Celecoxib] Anaphylaxis   Morphine And Codeine Swelling    Lips and mouth   Povidone-Iodine Other (See Comments)    Other reaction(s): ITCHING (12/27/23 Pt reports no issues.)    Sulfa Antibiotics Nausea Only    Outpatient Encounter Medications as of 03/08/2024  Medication Sig   acetaminophen  (TYLENOL ) 325 MG tablet Take 650 mg by mouth every 4 (four) hours as needed.   ALPRAZolam  (XANAX ) 0.25 MG tablet Take 1 tablet (0.25 mg total) by mouth at bedtime.   Calcium Carb-Cholecalciferol (CALCIUM 600+D) 600-20 MG-MCG TABS Take 1 tablet by mouth daily.   Cyanocobalamin (B-12 COMPLIANCE INJECTION IJ) Inject 1,000 mcg as directed every 30 (thirty) days.   GUAIFENESIN PO Take 10 mLs by mouth every 6  (six) hours as needed.   letrozole  (FEMARA ) 2.5 MG tablet Take 1 tablet (2.5 mg total) by mouth daily.   levothyroxine  (SYNTHROID ) 75 MCG tablet Take 75 mcg by mouth daily. No food 2 hours prior to bedtime.   oxyCODONE  (ROXICODONE ) 5 MG immediate release tablet Take 0.5 tablets (2.5 mg total) by mouth every 6 (six) hours as needed for severe pain (pain score 7-10).   pantoprazole  (PROTONIX ) 40 MG tablet Take 40 mg by mouth every morning.   polyethylene glycol (MIRALAX  / GLYCOLAX ) 17 g packet Take 17 g by mouth as needed.   sertraline  (ZOLOFT ) 50 MG tablet Take 50 mg by mouth daily. hs   Simethicone  (GAS-X PO) Take 125 mg by mouth daily as needed.   WARFARIN SODIUM  PO Take 3.5 mg by mouth daily.   No facility-administered encounter medications on file as of 03/08/2024.    Review of Systems:  Review of Systems  Constitutional:  Negative for activity change and appetite change.  HENT: Negative.    Respiratory:  Negative for cough and shortness of breath.   Cardiovascular:  Negative for leg swelling.  Gastrointestinal:  Positive for abdominal distention. Negative for constipation.  Genitourinary: Negative.   Musculoskeletal:  Negative for arthralgias, gait problem and myalgias.  Skin: Negative.   Neurological:  Negative for dizziness and weakness.  Psychiatric/Behavioral:  Negative for confusion, dysphoric mood and sleep disturbance.     Health Maintenance  Topic Date Due   Zoster Vaccines- Shingrix (1 of 2) 02/10/1951   DEXA SCAN  Never done   COVID-19 Vaccine (5 - 2024-25 season) 03/27/2023   INFLUENZA VACCINE  02/24/2024   Medicare Annual Wellness (AWV)  04/10/2024   DTaP/Tdap/Td (2 - Td or Tdap) 04/16/2026   Pneumococcal Vaccine: 50+ Years  Completed   HPV VACCINES  Aged Out   Meningococcal B Vaccine  Aged Out    Physical Exam: There were no vitals filed for this visit. There is no height or weight on file to calculate BMI. Physical Exam Vitals reviewed.  Constitutional:       Appearance: Normal appearance.  HENT:     Head: Normocephalic.     Nose: Nose normal.     Mouth/Throat:     Mouth: Mucous membranes are moist.     Pharynx: Oropharynx is clear.  Eyes:     Pupils: Pupils are equal, round, and reactive to light.  Cardiovascular:     Rate and Rhythm: Normal rate and regular rhythm.     Pulses: Normal pulses.  Heart sounds: Normal heart sounds. No murmur heard. Pulmonary:     Effort: Pulmonary effort is normal.     Breath sounds: Normal breath sounds.  Abdominal:     General: Abdomen is flat. There is no distension.     Palpations: Abdomen is soft.     Tenderness: There is no abdominal tenderness. There is no guarding.     Comments: Some discomfort noticed on the right side of her pouch.  Bowel sounds were decreased  Musculoskeletal:        General: No swelling.     Cervical back: Neck supple.  Skin:    General: Skin is warm.  Neurological:     General: No focal deficit present.     Mental Status: She is alert and oriented to person, place, and time.  Psychiatric:        Mood and Affect: Mood normal.        Thought Content: Thought content normal.     Labs reviewed: Basic Metabolic Panel: Recent Labs    03/22/23 0958 07/01/23 0000 10/12/23 1217  NA 137 138 138  K 3.9 4.3 4.1  CL 110 108 106  CO2 21* 25* 22  GLUCOSE 108*  --  92  BUN 22 23* 20  CREATININE 0.96 1.1 1.11*  CALCIUM 9.6 9.2 9.2  TSH  --  1.67  --    Liver Function Tests: Recent Labs    03/22/23 0958 07/01/23 0000 10/12/23 1217  AST 20 12* 18  ALT 19 7 14   ALKPHOS 52 47 51  BILITOT 0.4  --  0.6  PROT 7.3  --  7.1  ALBUMIN 4.0 3.7 3.5   No results for input(s): LIPASE, AMYLASE in the last 8760 hours. No results for input(s): AMMONIA in the last 8760 hours. CBC: Recent Labs    03/22/23 0958 03/22/23 0958 07/01/23 0000 10/06/23 0000 10/10/23 0000 10/12/23 1217  WBC 6.3   < > 4.1 6.1 4.7 5.5  NEUTROABS 4.6  --  2,608.00  --   --  3.7  HGB  12.3  --  12.0 11.1* 10.5* 11.1*  HCT 37.6  --  36 34* 32* 34.4*  MCV 96.7  --   --   --   --  97.2  PLT 235  --  208 192 213 224   < > = values in this interval not displayed.   Lipid Panel: No results for input(s): CHOL, HDL, LDLCALC, TRIG, CHOLHDL, LDLDIRECT in the last 8760 hours. No results found for: HGBA1C  Procedures since last visit: No results found.  Assessment/Plan 1. Right sided abdominal pain (Primary) Her exam is benign. I ordered a KUB It did not show any obstruction. or any constipation. Patient was reassured.  I went and checked on her later and she said that she felt little better and did release a lot of gas in her bag Will continue to follow for now    Labs/tests ordered:  * No order type specified * Next appt:  Visit date not found

## 2024-03-27 ENCOUNTER — Other Ambulatory Visit: Payer: Self-pay | Admitting: Adult Health

## 2024-03-27 DIAGNOSIS — F419 Anxiety disorder, unspecified: Secondary | ICD-10-CM

## 2024-03-27 MED ORDER — ALPRAZOLAM 0.25 MG PO TABS: 0.2500 mg | ORAL_TABLET | Freq: Every day | ORAL | 0 refills | Status: DC

## 2024-04-13 ENCOUNTER — Non-Acute Institutional Stay: Admitting: Orthopedic Surgery

## 2024-04-13 ENCOUNTER — Encounter: Payer: Self-pay | Admitting: Orthopedic Surgery

## 2024-04-13 DIAGNOSIS — Z0000 Encounter for general adult medical examination without abnormal findings: Secondary | ICD-10-CM

## 2024-04-13 NOTE — Progress Notes (Signed)
 Subjective:   Gabriela [name] is a 89 y.o. female who presents for Medicare Annual (Subsequent) preventive examination.  Visit Complete: In person  Patient Medicare AWV questionnaire was completed by the patient on 04/13/2024; I have confirmed that all information answered by patient is correct and no changes since this date.  Cardiac Risk Factors include: advanced age (>58men, >27 women);hypertension;sedentary lifestyle     Objective:    Today's Vitals   04/13/24 1237  BP: 127/61  Pulse: 62  Resp: 17  Temp: (!) 97.4 F (36.3 C)  SpO2: 97%  Weight: 137 lb (62.1 kg)  Height: 5' (1.524 m)   Body mass index is 26.76 kg/m.     04/13/2024   12:47 PM 10/20/2023   11:03 AM 10/12/2023   12:15 PM 09/19/2023    2:53 PM 07/11/2023   11:44 AM 04/11/2023   10:27 AM 03/22/2023    9:14 AM  Advanced Directives  Does Patient Have a Medical Advance Directive? Yes No No Yes Yes Yes Yes  Type of Estate agent of [place];Out of facility DNR (pink MOST or yellow form)   Living will;Out of facility DNR (pink MOST or yellow form) Out of facility DNR (pink MOST or yellow form) Healthcare Power of [place];Out of facility DNR (pink MOST or yellow form) Healthcare Power of [place];Living will  Does patient want to make changes to medical advance directive? No - Patient declined No - Patient declined  No - Patient declined No - Patient declined No - Patient declined   Copy of Healthcare Power of Attorney in Chart? Yes - validated most recent copy scanned in chart (See row information)     No - copy requested   Would patient like information on creating a medical advance directive?  No - Patient declined         Current Medications (verified) Outpatient Encounter Medications as of 04/13/2024  Medication Sig   acetaminophen  (TYLENOL ) 325 MG tablet Take 650 mg by mouth every 4 (four) hours as needed.   ALPRAZolam  (XANAX ) 0.25 MG tablet Take 1 tablet (0.25 mg total) by mouth at  bedtime.   Calcium Carb-Cholecalciferol (CALCIUM 600+D) 600-20 MG-MCG TABS Take 1 tablet by mouth daily.   Cyanocobalamin (B-12 COMPLIANCE INJECTION IJ) Inject 1,000 mcg as directed every 30 (thirty) days.   GUAIFENESIN PO Take 10 mLs by mouth every 6 (six) hours as needed.   letrozole  (FEMARA ) 2.5 MG tablet Take 1 tablet (2.5 mg total) by mouth daily.   levothyroxine  (SYNTHROID ) 75 MCG tablet Take 75 mcg by mouth daily. No food 2 hours prior to bedtime.   oxyCODONE  (ROXICODONE ) 5 MG immediate release tablet Take 0.5 tablets (2.5 mg total) by mouth every 6 (six) hours as needed for severe pain (pain score 7-10).   pantoprazole  (PROTONIX ) 40 MG tablet Take 40 mg by mouth every morning.   polyethylene glycol (MIRALAX  / GLYCOLAX ) 17 g packet Take 17 g by mouth as needed.   polyethylene glycol (MIRALAX  / GLYCOLAX ) 17 g packet Take 17 g by mouth daily.   sertraline  (ZOLOFT ) 50 MG tablet Take 50 mg by mouth daily. hs   Simethicone  (GAS-X PO) Take 125 mg by mouth daily as needed.   WARFARIN SODIUM  PO Take 3.5 mg by mouth daily.   No facility-administered encounter medications on file as of 04/13/2024.    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   Past Surgical History:  Procedure Laterality Date   ABDOMINAL HYSTERECTOMY     ANKLE ARTHODESIS W/ ARTHROSCOPY     BLADDER SURGERY     CATARACT EXTRACTION W/PHACO Left 12/02/2014   Procedure: CATARACT EXTRACTION PHACO AND INTRAOCULAR LENS PLACEMENT (IOC);  Surgeon: Steven Dingeldein, MD;  Location: [facility] ORS;  Service: Ophthalmology;  Laterality: Left;  US01:45 AP%25.7 CDE42.99   CHOLECYSTECTOMY     EYE SURGERY     cataract   ILEOSTOMY CLOSURE N/A 04/15/2021   Procedure: ILEOSTOMY TAKEDOWN REVISION;  Surgeon: Lane Shope, MD;  Location: [facility] ORS;  Service: General;  Laterality: N/A;  not a takedown, just a revision...   INSERTION OF SUPRAPUBIC CATHETER  04/06/2021   Procedure: INSERTION OF FOLEY CATHETER;  Surgeon: Francisca Redell BROCKS, MD;  Location: [facility] ORS;  Service: Urology;;   JOINT REPLACEMENT     bil tkr   MASTECTOMY     partial right   REPLACEMENT TOTAL KNEE     Family History  Adopted: Yes   Social History   Socioeconomic History   Marital status: Widowed    Spouse name: Not on file   Number of children: Not on file   Years of education: Not on file   Highest education level: Not on file  Occupational History   Not on file  Tobacco Use   Smoking status: Never   Smokeless tobacco: Never  Vaping Use   Vaping status: Never Used  Substance and Sexual Activity   Alcohol use: No   Drug use: Never   Sexual activity: Not on file  Other Topics Concern   Not on file  Social History Narrative   Not on file   Social Drivers of Health   Financial Resource Strain: Low Risk  (04/13/2024)   Overall Financial Resource Strain (CARDIA)    Difficulty of Paying Living Expenses: Not hard at all  Food Insecurity: No Food Insecurity (04/13/2024)   Hunger Vital Sign     Worried About Running Out of Food in the Last Year: Never true    Ran Out of Food in the Last Year: Never true  Transportation Needs: No Transportation Needs (04/13/2024)   PRAPARE - Administrator, Civil Service (Medical): No    Lack of Transportation (Non-Medical): No  Physical Activity: Insufficiently Active (04/13/2024)   Exercise Vital  Sign    Days of Exercise per Week: 4 days    Minutes of Exercise per Session: 10 min  Stress: No Stress Concern Present (04/13/2024)   [employer] of Occupational Health - Occupational Stress Questionnaire    Feeling of Stress: Only a little  Social Connections: Moderately Isolated (04/13/2024)   Social Connection and Isolation Panel    Frequency of Communication with Friends and Family: More than three times a week    Frequency of Social Gatherings with Friends and Family: Twice a week    Attends Religious Services: More than 4 times per year    Active Member of [employer] or Organizations: No    Attends Banker Meetings: Never    Marital Status: Widowed    Tobacco Counseling Counseling given: Not Answered   Clinical Intake:  Pre-visit preparation completed: Yes  Pain : No/denies pain     BMI - recorded: 26.76 Nutritional Status: BMI 25 -29 Overweight Nutritional Risks: None Diabetes: No  How often do you need to have someone help you when you read instructions, pamphlets, or other written materials from your doctor or pharmacy?: 2 - Rarely What is the last grade level you completed in school?: college  Interpreter Needed?: No      Activities of Daily Living    04/13/2024    1:29 PM  In your present state of health, do you have any difficulty performing the following activities:  Hearing? 0  Vision? 0  Difficulty concentrating or making decisions? 0  Walking or climbing stairs? 1  Dressing or bathing? 1  Doing errands, shopping? 1  Preparing Food and eating ? Y  Using the Toilet? Y  In the past six  months, have you accidently leaked urine? Y  Do you have problems with loss of bowel control? N  Comment colostomy  Managing your Medications? Y  Managing your Finances? Y  Housekeeping or managing your Housekeeping? Y    Patient Care Team: Charlanne Fredia CROME, MD as PCP - General (Internal Medicine) Melanee Annah BROCKS, MD as Consulting Physician (Oncology)  Indicate any recent Medical Services you may have received from other than [facility] providers in the past year (date may be approximate).     Assessment:   This is a routine wellness examination for [place].  Hearing/Vision screen No results found.   Goals Addressed             This Visit's Progress    Maintain Mobility and Function   On track    Evidence-based guidance:  Acknowledge and validate impact of pain, loss of strength and potential disfigurement (hand osteoarthritis) on mental health and daily life, such as social isolation, anxiety, depression, impaired sexual relationship and   injury from falls.  Anticipate referral to physical or occupational therapy for assessment, therapeutic exercise and recommendation for adaptive equipment or assistive devices; encourage participation.  Assess impact on ability to perform activities of daily living, as well as engage in sports and leisure events or requirements of work or school.  Provide anticipatory guidance and reassurance about the benefit of exercise to maintain function; acknowledge and normalize fear that exercise may worsen symptoms.  Encourage regular exercise, at least 10 minutes at a time for 45 minutes per week; consider yoga, water  exercise and proprioceptive exercises; encourage use of wearable activity tracker to increase motivation and adherence.  Encourage maintenance or resumption of daily activities, including employment, as pain allows and with minimal exposure to trauma.  Assist patient to advocate  for adaptations to the work environment.  Consider level of pain  and function, gender, age, lifestyle, patient preference, quality of life, readiness and ?ocapacity to benefit? when recommending patients for orthopaedic surgery consultation.  Explore strategies, such as changes to medication regimen or activity that enables patient to anticipate and manage flare-ups that increase deconditioning and disability.  Explore patient preferences; encourage exposure to a broader range of activities that have been avoided for fear of experiencing pain.  Identify barriers to participation in therapy or exercise, such as pain with activity, anticipated or imagined pain.  Monitor postoperative joint replacement or any preexisting joint replacement for ongoing pain and loss of function; provide social support and encouragement throughout recovery.   Notes:        Depression Screen    04/13/2024    1:30 PM 12/06/2023   11:51 AM 09/19/2023    7:23 PM 06/29/2023    3:25 PM 04/11/2023   10:15 AM  PHQ 2/9 Scores  PHQ - 2 Score 0 0 0 0 0    Fall Risk    04/13/2024    1:31 PM 12/06/2023   11:51 AM 09/19/2023    7:22 PM 07/11/2023    2:27 PM 06/29/2023    3:25 PM  Fall Risk   Falls in the past year? 0 0 1 1 1   Number falls in past yr: 0 0 0 0 0  Injury with Fall? 0 0 0 0 0  Risk for fall due to : History of fall(s);Impaired balance/gait History of fall(s) History of fall(s);Impaired balance/gait History of fall(s);Impaired balance/gait;Impaired mobility History of fall(s);Impaired balance/gait;Impaired mobility  Follow up Falls evaluation completed Falls evaluation completed Falls evaluation completed;Education provided Falls evaluation completed;Education provided Falls evaluation completed;Education provided;Falls prevention discussed    MEDICARE RISK AT HOME: Medicare Risk at Home Any stairs in or around the home?: No If so, are there any without handrails?: No Home free of loose throw rugs in walkways, pet beds, electrical cords, etc?: Yes Adequate lighting in  your home to reduce risk of falls?: Yes Life alert?: No Use of a cane, walker or w/c?: Yes Grab bars in the bathroom?: Yes Shower chair or bench in shower?: Yes Elevated toilet seat or a handicapped toilet?: Yes  TIMED UP AND GO:  Was the test performed?  No    Cognitive Function:    04/11/2023   10:17 AM  MMSE - Mini Mental State Exam  Orientation to time 5  Orientation to Place 5  Registration 3  Attention/ Calculation 5  Recall 2  Language- name 2 objects 2  Language- repeat 1  Language- follow 3 step command 3  Language- read & follow direction 1  Write a sentence 1  Copy design 1  Total score 29        04/13/2024    1:31 PM  6CIT Screen  What Year? 0 points  What month? 0 points  What time? 0 points  Count back from 20 0 points  Months in reverse 0 points  Repeat phrase 0 points  Total Score 0 points    Immunizations Immunization History  Administered Date(s) Administered   Fluad Quad(high Dose 65+) 05/24/2023   INFLUENZA, HIGH DOSE SEASONAL PF 05/30/2018, 05/31/2019, 05/26/2020   Influenza Inj Mdck Quad Pf 05/28/2022   Influenza-Unspecified 05/30/2014, 06/09/2015, 04/25/2021   Moderna Covid-19 Vaccine Bivalent Booster 72yrs & up 06/15/2022   PFIZER Comirnaty(Gray Top)Covid-19 Tri-Sucrose Vaccine 08/22/2019, 09/12/2019, 05/26/2020   Pneumococcal Conjugate-13 05/31/2016   Pneumococcal Polysaccharide-23  07/27/2003   Tdap 04/16/2016   Zoster, Live 07/27/2003    TDAP status: Up to date  Flu Vaccine status: Due, Education has been provided regarding the importance of this vaccine. Advised may receive this vaccine at local pharmacy or Health Dept. Aware to provide a copy of the vaccination record if obtained from local pharmacy or Health Dept. Verbalized acceptance and understanding.  Pneumococcal vaccine status: Up to date  Covid-19 vaccine status: Completed vaccines  Qualifies for Shingles Vaccine? Yes   Zostavax completed Yes   Shingrix Completed?:  No.    Education has been provided regarding the importance of this vaccine. Patient has been advised to call insurance company to determine out of pocket expense if they have not yet received this vaccine. Advised may also receive vaccine at local pharmacy or Health Dept. Verbalized acceptance and understanding.  Screening Tests Health Maintenance  Topic Date Due   Zoster Vaccines- Shingrix (1 of 2) 02/10/1951   Influenza Vaccine  02/24/2024   COVID-19 Vaccine (5 - 2025-26 season) 03/26/2024   Medicare Annual Wellness (AWV)  04/13/2025   DTaP/Tdap/Td (2 - Td or Tdap) 04/16/2026   Pneumococcal Vaccine: 50+ Years  Completed   HPV VACCINES  Aged Out   Meningococcal B Vaccine  Aged Out   DEXA SCAN  Discontinued    Health Maintenance  Health Maintenance Due  Topic Date Due   Zoster Vaccines- Shingrix (1 of 2) 02/10/1951   Influenza Vaccine  02/24/2024   COVID-19 Vaccine (5 - 2025-26 season) 03/26/2024    Colorectal cancer screening: No longer required.   Mammogram status: No longer required due to advanced age.  Bone Density status: Completed unknown. Results reflect: Bone density results: OSTEOPENIA. Repeat every does not want future studies due to age and h/o cancer years.  Lung Cancer Screening: (Low Dose CT Chest recommended if Age 14-80 years, 20 pack-year currently smoking OR have quit w/in 15years.) does not qualify.   Lung Cancer Screening Referral: No  Additional Screening:  Hepatitis C Screening: does not qualify; Completed   Vision Screening: Recommended annual ophthalmology exams for early detection of glaucoma and other disorders of the eye. Is the patient up to date with their annual eye exam?  No  Who is the provider or what is the name of the office in which the patient attends annual eye exams? [facility] if needed If pt is not established with a provider, would they like to be referred to a provider to establish care? No .   Dental Screening: Recommended  annual dental exams for proper oral hygiene  Diabetic Foot Exam: Diabetic Foot Exam: Completed 04/13/2024  Community Resource Referral / Chronic Care Management: CRR required this visit?  No   CCM required this visit?  No     Plan:     I have personally reviewed and noted the following in the patient's chart:   Medical and social history Use of alcohol, tobacco or illicit drugs  Current medications and supplements including opioid prescriptions. Patient is not currently taking opioid prescriptions. Functional ability and status Nutritional status Physical activity Advanced directives List of other physicians Hospitalizations, surgeries, and ER visits in previous 12 months Vitals Screenings to include cognitive, depression, and falls Referrals and appointments  In addition, I have reviewed and discussed with patient certain preventive protocols, quality metrics, and best practice recommendations. A written personalized care plan for preventive services as well as general preventive health recommendations were provided to patient.     Trevia Nop E  Gil, NP   04/13/2024   After Visit Summary: (MyChart) Due to this being a telephonic visit, the after visit summary with patients personalized plan was offered to patient via MyChart   Nurse Notes: Patient lives in assisted living. Needs assistance with ADLs. Ambulates with wheelchair. Facility to offer flu and covid vaccines this fall. Declined future bone density or mammogram. She is requesting new Shingrix vaccine. Orders given to nursing.

## 2024-04-13 NOTE — Patient Instructions (Signed)
  [name] , Thank you for taking time to come for your Medicare Wellness Visit. I appreciate your ongoing commitment to your health goals. Please review the following plan we discussed and let me know if I can assist you in the future.   These are the goals we discussed:  Goals      DIET - INCREASE WATER  INTAKE     Maintain Mobility and Function     Evidence-based guidance:  Acknowledge and validate impact of pain, loss of strength and potential disfigurement (hand osteoarthritis) on mental health and daily life, such as social isolation, anxiety, depression, impaired sexual relationship and   injury from falls.  Anticipate referral to physical or occupational therapy for assessment, therapeutic exercise and recommendation for adaptive equipment or assistive devices; encourage participation.  Assess impact on ability to perform activities of daily living, as well as engage in sports and leisure events or requirements of work or school.  Provide anticipatory guidance and reassurance about the benefit of exercise to maintain function; acknowledge and normalize fear that exercise may worsen symptoms.  Encourage regular exercise, at least 10 minutes at a time for 45 minutes per week; consider yoga, water  exercise and proprioceptive exercises; encourage use of wearable activity tracker to increase motivation and adherence.  Encourage maintenance or resumption of daily activities, including employment, as pain allows and with minimal exposure to trauma.  Assist patient to advocate for adaptations to the work environment.  Consider level of pain and function, gender, age, lifestyle, patient preference, quality of life, readiness and ?ocapacity to benefit? when recommending patients for orthopaedic surgery consultation.  Explore strategies, such as changes to medication regimen or activity that enables patient to anticipate and manage flare-ups that increase deconditioning and disability.  Explore  patient preferences; encourage exposure to a broader range of activities that have been avoided for fear of experiencing pain.  Identify barriers to participation in therapy or exercise, such as pain with activity, anticipated or imagined pain.  Monitor postoperative joint replacement or any preexisting joint replacement for ongoing pain and loss of function; provide social support and encouragement throughout recovery.   Notes:         This is a list of the screening recommended for you and due dates:  Health Maintenance  Topic Date Due   Zoster (Shingles) Vaccine (1 of 2) 02/10/1951   Flu Shot  02/24/2024   COVID-19 Vaccine (5 - 2025-26 season) 03/26/2024   Medicare Annual Wellness Visit  04/13/2025   DTaP/Tdap/Td vaccine (2 - Td or Tdap) 04/16/2026   Pneumococcal Vaccine for age over 30  Completed   HPV Vaccine  Aged Out   Meningitis B Vaccine  Aged Out   DEXA scan (bone density measurement)  Discontinued   Patient lives in assisted living. Needs assistance with ADLs. Ambulates with wheelchair. Facility to offer flu and covid vaccines this fall. Declined future bone density or mammogram. She is requesting new Shingrix vaccine. Orders given to nursing.

## 2024-04-24 ENCOUNTER — Other Ambulatory Visit: Payer: Self-pay | Admitting: Adult Health

## 2024-04-24 DIAGNOSIS — F419 Anxiety disorder, unspecified: Secondary | ICD-10-CM

## 2024-04-24 MED ORDER — ALPRAZOLAM 0.25 MG PO TABS: 0.2500 mg | ORAL_TABLET | Freq: Every day | ORAL | 0 refills | Status: AC

## 2024-04-30 DIAGNOSIS — D802 Selective deficiency of immunoglobulin A [IgA]: Secondary | ICD-10-CM | POA: Diagnosis not present

## 2024-04-30 DIAGNOSIS — R0602 Shortness of breath: Secondary | ICD-10-CM | POA: Diagnosis not present

## 2024-05-14 DIAGNOSIS — Z85038 Personal history of other malignant neoplasm of large intestine: Secondary | ICD-10-CM | POA: Diagnosis not present

## 2024-05-14 DIAGNOSIS — Z932 Ileostomy status: Secondary | ICD-10-CM | POA: Diagnosis not present

## 2024-05-31 DIAGNOSIS — R0602 Shortness of breath: Secondary | ICD-10-CM | POA: Diagnosis not present

## 2024-08-09 ENCOUNTER — Non-Acute Institutional Stay: Payer: Self-pay | Admitting: Internal Medicine

## 2024-08-09 DIAGNOSIS — C7951 Secondary malignant neoplasm of bone: Secondary | ICD-10-CM | POA: Diagnosis not present

## 2024-08-09 DIAGNOSIS — I482 Chronic atrial fibrillation, unspecified: Secondary | ICD-10-CM

## 2024-08-09 DIAGNOSIS — F339 Major depressive disorder, recurrent, unspecified: Secondary | ICD-10-CM | POA: Diagnosis not present

## 2024-08-09 DIAGNOSIS — E039 Hypothyroidism, unspecified: Secondary | ICD-10-CM | POA: Diagnosis not present

## 2024-08-09 DIAGNOSIS — C50911 Malignant neoplasm of unspecified site of right female breast: Secondary | ICD-10-CM

## 2024-08-09 DIAGNOSIS — Z7901 Long term (current) use of anticoagulants: Secondary | ICD-10-CM

## 2024-08-09 DIAGNOSIS — Z932 Ileostomy status: Secondary | ICD-10-CM | POA: Diagnosis not present

## 2024-08-09 DIAGNOSIS — R109 Unspecified abdominal pain: Secondary | ICD-10-CM | POA: Diagnosis not present

## 2024-08-10 ENCOUNTER — Encounter: Payer: Self-pay | Admitting: Internal Medicine

## 2024-08-10 NOTE — Progress Notes (Signed)
 "  Location:  [facility] of Service:  ALF ([id number])  Provider:   Code Status: DNR Goals of Care:     04/13/2024   12:47 PM  Advanced Directives  Does Patient Have a Medical Advance Directive? Yes  Type of Estate Agent of [place];Out of facility DNR (pink MOST or yellow form)  Does patient want to make changes to medical advance directive? No - Patient declined  Copy of Healthcare Power of Attorney in Chart? Yes - validated most recent copy scanned in chart (See row information)     Chief Complaint  Patient presents with   Care Management    HPI: Patient is a 89 y.o. female seen today for medical management of chronic diseases.    Patient lives in [place]   Diagnosis of metastatic breast cancer PET scan done has shown multifocal osseous metastasis including in the left L1, left L5 and iliac crest and right Acetabular Patient also has a history of A-fib.  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   Patient continues to be stable in AL.   She sometimes gets abdominal distention and pain KUB in the past had shown no obstruction or constipation.   Symptoms mostly gets relieved eventually after she passes some gas. She also gets some back pain which is usually resolved with oxycodone  Uses Xanax  to help her anxiety Otherwise patient walks with her walker her cognitively doing well. Had to go to ED 1 time for rectal bleeding which got resolved with Anusol . 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    Past Surgical History:  Procedure Laterality Date   ABDOMINAL HYSTERECTOMY     ANKLE ARTHODESIS W/ ARTHROSCOPY     BLADDER SURGERY     CATARACT EXTRACTION W/PHACO Left 12/02/2014   Procedure: CATARACT EXTRACTION PHACO AND INTRAOCULAR LENS PLACEMENT (IOC);  Surgeon: Steven Dingeldein, MD;  Location: [facility] ORS;  Service: Ophthalmology;  Laterality: Left;  US01:45 AP%25.7 CDE42.99  CHOLECYSTECTOMY     EYE SURGERY     cataract   ILEOSTOMY CLOSURE N/A 04/15/2021   Procedure: ILEOSTOMY TAKEDOWN REVISION;  Surgeon: Lane Shope, MD;  Location: [facility] ORS;  Service: General;  Laterality: N/A;  not a takedown, just a revision...   INSERTION OF SUPRAPUBIC CATHETER  04/06/2021   Procedure: INSERTION OF FOLEY CATHETER;  Surgeon: Francisca Redell BROCKS, MD;  Location: [facility] ORS;  Service: Urology;;   JOINT REPLACEMENT     bil tkr   MASTECTOMY     partial right   REPLACEMENT TOTAL KNEE      Allergies[1]  Outpatient Encounter Medications as of 08/09/2024  Medication Sig   acetaminophen  (TYLENOL ) 325 MG tablet Take 650 mg by mouth every 4 (four) hours as needed.   ALPRAZolam  (XANAX ) 0.25 MG tablet Take 1 tablet (0.25 mg total) by mouth at bedtime.   Calcium Carb-Cholecalciferol (CALCIUM 600+D) 600-20 MG-MCG TABS Take 1 tablet by  mouth daily.   Cyanocobalamin  (B-12 COMPLIANCE INJECTION IJ) Inject 1,000 mcg as directed every 30 (thirty) days.   GUAIFENESIN PO Take 10 mLs by mouth every 6 (six) hours as needed.   letrozole  (FEMARA ) 2.5 MG tablet Take 1 tablet (2.5 mg total) by mouth daily.   levothyroxine  (SYNTHROID ) 75 MCG tablet Take 75 mcg by mouth daily. No food 2 hours prior to bedtime.   oxyCODONE  (ROXICODONE ) 5 MG immediate release tablet Take 0.5 tablets (2.5 mg total) by mouth every 6 (six) hours as needed for severe pain (pain score 7-10).   pantoprazole  (PROTONIX ) 40 MG tablet Take 40 mg by mouth every morning.   polyethylene glycol (MIRALAX  / GLYCOLAX ) 17 g packet Take 17 g by mouth as needed.   polyethylene glycol (MIRALAX  / GLYCOLAX ) 17 g packet Take 17 g by mouth daily.   sertraline  (ZOLOFT ) 50 MG tablet Take 50 mg by mouth daily. hs   Simethicone  (GAS-X PO) Take 125 mg by mouth daily as needed.   WARFARIN SODIUM  PO Take 3.5 mg by mouth daily.   No facility-administered encounter medications on file as of 08/09/2024.    Review of Systems:  Review of Systems  Constitutional:  Negative for activity change and appetite change.  HENT: Negative.    Respiratory:  Negative for cough and shortness of breath.   Cardiovascular:  Negative for leg swelling.  Gastrointestinal:  Positive for abdominal distention. Negative for constipation.  Genitourinary: Negative.   Musculoskeletal:  Positive for back pain and gait problem. Negative for arthralgias and myalgias.  Skin: Negative.   Neurological:  Negative for dizziness and weakness.  Psychiatric/Behavioral:  Negative for confusion, dysphoric mood and sleep disturbance.     Health Maintenance  Topic Date Due   Zoster Vaccines- Shingrix (1 of 2) 02/10/1951   Mammogram  Never done   Influenza Vaccine  02/24/2024   COVID-19 Vaccine (5 - 2025-26 season) 03/26/2024   Medicare Annual Wellness (AWV)  04/13/2025   DTaP/Tdap/Td (2 - Td or Tdap) 04/16/2026    Pneumococcal Vaccine: 50+ Years  Completed   Meningococcal B Vaccine  Aged Out   Bone Density Scan  Discontinued    Physical Exam: Vitals:   08/09/24 1128  BP: 120/86  Pulse: 77  Resp: 20  Temp: (!) 97.5 F (36.4 C)  Weight: 139 lb (63 kg)   Body mass index is 27.15 kg/m. Physical Exam Vitals reviewed.  Constitutional:      Appearance: Normal appearance.  HENT:     Head: Normocephalic.     Nose:  Nose normal.     Mouth/Throat:     Mouth: Mucous membranes are moist.     Pharynx: Oropharynx is clear.  Eyes:     Pupils: Pupils are equal, round, and reactive to light.  Cardiovascular:     Rate and Rhythm: Normal rate. Rhythm irregular.     Pulses: Normal pulses.     Heart sounds: Normal heart sounds. No murmur heard. Pulmonary:     Effort: Pulmonary effort is normal.     Breath sounds: Normal breath sounds.  Abdominal:     General: Abdomen is flat. Bowel sounds are normal.     Palpations: Abdomen is soft.     Comments: HAS Colostomy bag  Musculoskeletal:        General: No swelling.     Cervical back: Neck supple.  Skin:    General: Skin is warm.  Neurological:     General: No focal deficit present.     Mental Status: She is alert and oriented to person, place, and time.  Psychiatric:        Mood and Affect: Mood normal.        Thought Content: Thought content normal.     Labs reviewed: Basic Metabolic Panel: Recent Labs    10/12/23 1217 02/16/24 0000  NA 138 140  K 4.1 4.4  CL 106 110*  CO2 22 26*  GLUCOSE 92  --   BUN 20 17  CREATININE 1.11* 1.1  CALCIUM 9.2 9.2   Liver Function Tests: Recent Labs    10/12/23 1217 02/16/24 0000  AST 18 15  ALT 14 10  ALKPHOS 51 62  BILITOT 0.6  --   PROT 7.1  --   ALBUMIN 3.5 3.6   No results for input(s): LIPASE, AMYLASE in the last 8760 hours. No results for input(s): AMMONIA in the last 8760 hours. CBC: Recent Labs    10/10/23 0000 10/12/23 1217 02/16/24 0000  WBC 4.7 5.5 3.9  NEUTROABS   --  3.7 2,293.00  HGB 10.5* 11.1* 11.2*  HCT 32* 34.4* 34*  MCV  --  97.2  --   PLT 213 224 213   Lipid Panel: No results for input(s): CHOL, HDL, LDLCALC, TRIG, CHOLHDL, LDLDIRECT in the last 8760 hours. No results found for: HGBA1C  Procedures since last visit: No results found.  Assessment/Plan 1. Right sided abdominal discomfort (Primary) On and Off going. Mostly due to her hernia and Gaseous Distension Avoid Constipation  2. Atrial fibrillation, chronic (HCC) On Coumadin   3. Carcinoma of right breast metastatic to bone [facility]) On Palliative Letrozole   4. Acquired hypothyroidism TSH normal in 12/24  5. Depression, recurrent Zoloft  and Xanax  No GDR  6. Ileostomy present (HCC)   7. Long term current use of anticoagulant INR was in Range just checked Same doe=se of Coumadin     Labs/tests ordered:  CBC,CMPTSH Next appt:  Visit date not found         [1]  Allergies Allergen Reactions   Celebrex [Celecoxib] Anaphylaxis   Morphine And Codeine Swelling    Lips and mouth   Povidone-Iodine Other (See Comments)    Other reaction(s): ITCHING (12/27/23 Pt reports no issues.)    Sulfa Antibiotics Nausea Only   "

## 2024-08-10 NOTE — Progress Notes (Signed)
 A user error has taken place.

## 2024-08-12 ENCOUNTER — Encounter: Payer: Self-pay | Admitting: Internal Medicine
